# Patient Record
Sex: Female | Born: 1940
Health system: Southern US, Community
[De-identification: ages and names within clinical notes are randomized; demographics above are authoritative.]

## PROBLEM LIST (undated history)

## (undated) DIAGNOSIS — K219 Gastro-esophageal reflux disease without esophagitis: Secondary | ICD-10-CM

## (undated) DIAGNOSIS — E11319 Type 2 diabetes mellitus with unspecified diabetic retinopathy without macular edema: Secondary | ICD-10-CM

## (undated) DIAGNOSIS — T8859XA Other complications of anesthesia, initial encounter: Secondary | ICD-10-CM

## (undated) DIAGNOSIS — Z87442 Personal history of urinary calculi: Secondary | ICD-10-CM

## (undated) DIAGNOSIS — N189 Chronic kidney disease, unspecified: Secondary | ICD-10-CM

## (undated) DIAGNOSIS — I639 Cerebral infarction, unspecified: Secondary | ICD-10-CM

## (undated) DIAGNOSIS — E119 Type 2 diabetes mellitus without complications: Secondary | ICD-10-CM

## (undated) DIAGNOSIS — I10 Essential (primary) hypertension: Secondary | ICD-10-CM

## (undated) DIAGNOSIS — H35039 Hypertensive retinopathy, unspecified eye: Secondary | ICD-10-CM

## (undated) HISTORY — PX: EYE SURGERY: SHX253

## (undated) HISTORY — DX: Type 2 diabetes mellitus with unspecified diabetic retinopathy without macular edema: E11.319

## (undated) HISTORY — PX: HAND SURGERY: SHX662

## (undated) HISTORY — DX: Hypertensive retinopathy, unspecified eye: H35.039

## (undated) HISTORY — PX: BREAST BIOPSY: SHX20

## (undated) HISTORY — PX: NO PAST SURGERIES: SHX2092

---

## 1997-10-21 ENCOUNTER — Ambulatory Visit (HOSPITAL_COMMUNITY): Admission: RE | Admit: 1997-10-21 | Discharge: 1997-10-21 | Payer: Self-pay | Admitting: General Surgery

## 1998-10-26 ENCOUNTER — Encounter (HOSPITAL_BASED_OUTPATIENT_CLINIC_OR_DEPARTMENT_OTHER): Payer: Self-pay | Admitting: General Surgery

## 1998-10-26 ENCOUNTER — Ambulatory Visit (HOSPITAL_COMMUNITY): Admission: RE | Admit: 1998-10-26 | Discharge: 1998-10-26 | Payer: Self-pay | Admitting: General Surgery

## 1999-10-28 ENCOUNTER — Ambulatory Visit (HOSPITAL_COMMUNITY): Admission: RE | Admit: 1999-10-28 | Discharge: 1999-10-28 | Payer: Self-pay | Admitting: General Surgery

## 1999-10-28 ENCOUNTER — Encounter (HOSPITAL_BASED_OUTPATIENT_CLINIC_OR_DEPARTMENT_OTHER): Payer: Self-pay | Admitting: General Surgery

## 2000-04-26 ENCOUNTER — Other Ambulatory Visit: Admission: RE | Admit: 2000-04-26 | Discharge: 2000-04-26 | Payer: Self-pay | Admitting: Family Medicine

## 2000-11-08 ENCOUNTER — Ambulatory Visit (HOSPITAL_COMMUNITY): Admission: RE | Admit: 2000-11-08 | Discharge: 2000-11-08 | Payer: Self-pay | Admitting: General Surgery

## 2000-11-08 ENCOUNTER — Encounter (HOSPITAL_BASED_OUTPATIENT_CLINIC_OR_DEPARTMENT_OTHER): Payer: Self-pay | Admitting: General Surgery

## 2001-11-11 ENCOUNTER — Encounter (HOSPITAL_BASED_OUTPATIENT_CLINIC_OR_DEPARTMENT_OTHER): Payer: Self-pay | Admitting: General Surgery

## 2001-11-11 ENCOUNTER — Ambulatory Visit (HOSPITAL_COMMUNITY): Admission: RE | Admit: 2001-11-11 | Discharge: 2001-11-11 | Payer: Self-pay | Admitting: General Surgery

## 2003-05-18 ENCOUNTER — Ambulatory Visit (HOSPITAL_COMMUNITY): Admission: RE | Admit: 2003-05-18 | Discharge: 2003-05-18 | Payer: Self-pay | Admitting: General Surgery

## 2003-09-19 ENCOUNTER — Emergency Department (HOSPITAL_COMMUNITY): Admission: EM | Admit: 2003-09-19 | Discharge: 2003-09-19 | Payer: Self-pay | Admitting: Emergency Medicine

## 2004-12-26 ENCOUNTER — Encounter (INDEPENDENT_AMBULATORY_CARE_PROVIDER_SITE_OTHER): Payer: Self-pay | Admitting: *Deleted

## 2004-12-26 ENCOUNTER — Ambulatory Visit (HOSPITAL_COMMUNITY): Admission: RE | Admit: 2004-12-26 | Discharge: 2004-12-26 | Payer: Self-pay | Admitting: Gastroenterology

## 2005-01-28 ENCOUNTER — Other Ambulatory Visit: Admission: RE | Admit: 2005-01-28 | Discharge: 2005-01-28 | Payer: Self-pay | Admitting: Family Medicine

## 2005-03-24 ENCOUNTER — Ambulatory Visit (HOSPITAL_COMMUNITY): Admission: RE | Admit: 2005-03-24 | Discharge: 2005-03-24 | Payer: Self-pay | Admitting: Family Medicine

## 2005-03-27 ENCOUNTER — Ambulatory Visit (HOSPITAL_COMMUNITY): Admission: RE | Admit: 2005-03-27 | Discharge: 2005-03-27 | Payer: Self-pay | Admitting: Orthopedic Surgery

## 2005-05-16 ENCOUNTER — Encounter (HOSPITAL_COMMUNITY): Admission: RE | Admit: 2005-05-16 | Discharge: 2005-05-24 | Payer: Self-pay | Admitting: Orthopedic Surgery

## 2005-05-29 ENCOUNTER — Encounter (HOSPITAL_COMMUNITY): Admission: RE | Admit: 2005-05-29 | Discharge: 2005-07-03 | Payer: Self-pay | Admitting: Orthopedic Surgery

## 2005-07-05 ENCOUNTER — Encounter (HOSPITAL_COMMUNITY): Admission: RE | Admit: 2005-07-05 | Discharge: 2005-08-04 | Payer: Self-pay | Admitting: Orthopedic Surgery

## 2006-04-30 ENCOUNTER — Ambulatory Visit (HOSPITAL_COMMUNITY): Admission: RE | Admit: 2006-04-30 | Discharge: 2006-04-30 | Payer: Self-pay | Admitting: Family Medicine

## 2007-08-14 ENCOUNTER — Ambulatory Visit (HOSPITAL_COMMUNITY): Admission: RE | Admit: 2007-08-14 | Discharge: 2007-08-14 | Payer: Self-pay | Admitting: Family Medicine

## 2008-04-17 ENCOUNTER — Encounter: Admission: RE | Admit: 2008-04-17 | Discharge: 2008-04-17 | Payer: Self-pay | Admitting: Orthopedic Surgery

## 2008-06-02 ENCOUNTER — Encounter: Admission: RE | Admit: 2008-06-02 | Discharge: 2008-06-02 | Payer: Self-pay | Admitting: Orthopedic Surgery

## 2008-06-24 ENCOUNTER — Encounter: Admission: RE | Admit: 2008-06-24 | Discharge: 2008-08-19 | Payer: Self-pay | Admitting: Orthopedic Surgery

## 2009-02-25 ENCOUNTER — Ambulatory Visit (HOSPITAL_COMMUNITY): Admission: RE | Admit: 2009-02-25 | Discharge: 2009-02-25 | Payer: Self-pay | Admitting: Family Medicine

## 2010-03-09 ENCOUNTER — Ambulatory Visit (HOSPITAL_COMMUNITY)
Admission: RE | Admit: 2010-03-09 | Discharge: 2010-03-09 | Payer: Self-pay | Source: Home / Self Care | Attending: Family Medicine | Admitting: Family Medicine

## 2010-07-15 NOTE — Op Note (Signed)
NAMEVERTA, RIEDLINGER               ACCOUNT NO.:  [id number]   MEDICAL RECORD NO.:  [id number]          PATIENT TYPE:  AMB   LOCATION:  SDS                          FACILITY:  [facility]   PHYSICIAN:  Myrtie Neither, MD      DATE OF BIRTH:  09/06/1940   DATE OF PROCEDURE:  03/27/2005  DATE OF DISCHARGE:                                 OPERATIVE REPORT   PREOPERATIVE DIAGNOSIS:  Comminuted fracture of the right fourth metacarpal.   POSTOPERATIVE DIAGNOSIS:  Comminuted fracture of the right fourth  metacarpal.   OPERATION PERFORMED:  Open reduction internal fixation with 5-hole plate,  right fourth metacarpal.   SURGEON:  Myrtie Neither, MD   ANESTHESIA:  General.   DESCRIPTION OF PROCEDURE:  The patient was taken to the operating room after  given adequate preop medication and given general anesthesia and intubated.  Right hand was prepped with DuraPrep and draped in sterile manner.  Tourniquet and Bovie used for hemostasis.  Then the C-arm was used to  visualize reduction.  Skin incisions were made in the dorsal aspect of the  fourth metacarpal going through the skin and subcutaneous tissue with sharp  and blunt dissection, made down to the fracture site, manipulating,  reduction was then done.  A 5-hole plate was used to stabilize the fracture  with use of four screws.  Copious irrigation was done, followed by wound  closure, 2-0 Vicryl for the subcutaneous and 4-0 nylon for the skin.  Compressive dressing was applied, wrist splint was applied.  The patient  tolerated the procedure quite well and went to recovery room in stable and  satisfactory condition.  The patient is being discharged home, ice packs,  elevation, use of sling, Percocet 1 to 2 every 4 hours as needed for pain.  To return to the office in one week.  Patient is being discharged in stable  and satisfactory condition.      Myrtie Neither, MD  Electronically Signed     AC/MEDQ  D:  03/27/2005  T:  03/27/2005   Job:  [id number]

## 2010-07-15 NOTE — Op Note (Signed)
NAMELAYCE, SPRUNG               ACCOUNT NO.:  [id number]   MEDICAL RECORD NO.:  [id number]          PATIENT TYPE:  AMB   LOCATION:  ENDO                         FACILITY:  [facility]   PHYSICIAN:  Anselmo Rod, M.D.  DATE OF BIRTH:  02-Mar-1940   DATE OF PROCEDURE:  12/26/2004  DATE OF DISCHARGE:                                 OPERATIVE REPORT   PROCEDURE:  Colonoscopy with cold biopsies x8.   ENDOSCOPIST:  Anselmo Rod, M.D.   INSTRUMENT USED:  Olympus videocolonoscope.   INDICATIONS FOR PROCEDURE:  The patient is a 70 year old African American  female undergoing screening colonoscopy to rule out colonic polyps, masses,  etc.   PREPROCEDURE PREPARATION:  Informed consent was secured from the patient.  The patient was fasted for four hours prior to the procedure and prepped  with OsmoPrep pills the night of and the morning of the procedure.  The  risks and benefits of the procedure, including a 10% missed rate of cancer  and polyps was discussed with the patient as well.   PREPROCEDURE PHYSICAL EXAMINATION:  VITAL SIGNS:  Stable.  NECK:  Supple.  LUNGS:  Clear to auscultation.  HEART:  S1 and S2 regular.  ABDOMEN:  Soft with normal bowel sounds.   DESCRIPTION OF PROCEDURE:  The patient was placed in the left lateral  decubitus position and sedated with 60 mg of Demerol and 6 mg of Versed in  slow incremental doses.  Once the patient was adequately sedated and  maintained on low-flow oxygen and continuous cardiac monitoring, the Olympus  videocolonoscope was advanced from the rectum to the cecum.  The appendiceal  orifice and ileocecal valve were visualized and photographed.  There was  some residual stool in the colon.  Multiple washings were done.  Six small  sessile polyps were biopsied from 60 to 70 cm.  The rest of the colon up to  the terminal ileum appeared normal.  Retroflexion in the rectum revealed no  abnormalities.   IMPRESSION:  1.  Six small sessile polyps  biopsied (cold biopsies x8) from the splenic      flexure at 60 to 70 cm.  2.  Otherwise normal colonoscopy up to the terminal ileum.   RECOMMENDATIONS:  1.  Await pathology results.  2.  Repeat colonoscopy depending on pathology results.  3.  Avoid nonsteroidals for the next two weeks.  4.  Outpatient followup as need arises in the future.      Anselmo Rod, M.D.  Electronically Signed     JNM/MEDQ  D:  12/26/2004  T:  12/26/2004  Job:  [id number]   cc:   Renaye Rakers, M.D.  Fax: [phone]

## 2011-02-15 ENCOUNTER — Ambulatory Visit
Admission: RE | Admit: 2011-02-15 | Discharge: 2011-02-15 | Disposition: A | Discharge status: Home / Self Care | Payer: Medicare Other | Source: Ambulatory Visit | Attending: Orthopedic Surgery | Admitting: Orthopedic Surgery

## 2011-02-15 ENCOUNTER — Other Ambulatory Visit: Payer: Self-pay | Admitting: Orthopedic Surgery

## 2011-02-15 DIAGNOSIS — M25562 Pain in left knee: Secondary | ICD-10-CM

## 2012-03-25 ENCOUNTER — Encounter (HOSPITAL_COMMUNITY): Payer: Self-pay | Admitting: Emergency Medicine

## 2012-03-25 ENCOUNTER — Emergency Department (HOSPITAL_COMMUNITY)
Admission: EM | Admit: 2012-03-25 | Discharge: 2012-03-25 | Disposition: A | Discharge status: Home / Self Care | Payer: No Typology Code available for payment source | Attending: Emergency Medicine | Admitting: Emergency Medicine

## 2012-03-25 ENCOUNTER — Emergency Department (HOSPITAL_COMMUNITY): Payer: No Typology Code available for payment source

## 2012-03-25 DIAGNOSIS — Z8673 Personal history of transient ischemic attack (TIA), and cerebral infarction without residual deficits: Secondary | ICD-10-CM | POA: Insufficient documentation

## 2012-03-25 DIAGNOSIS — Z87891 Personal history of nicotine dependence: Secondary | ICD-10-CM | POA: Insufficient documentation

## 2012-03-25 DIAGNOSIS — E119 Type 2 diabetes mellitus without complications: Secondary | ICD-10-CM | POA: Insufficient documentation

## 2012-03-25 DIAGNOSIS — I10 Essential (primary) hypertension: Secondary | ICD-10-CM | POA: Insufficient documentation

## 2012-03-25 DIAGNOSIS — T148XXA Other injury of unspecified body region, initial encounter: Secondary | ICD-10-CM

## 2012-03-25 DIAGNOSIS — Y92410 Unspecified street and highway as the place of occurrence of the external cause: Secondary | ICD-10-CM | POA: Insufficient documentation

## 2012-03-25 DIAGNOSIS — Y9389 Activity, other specified: Secondary | ICD-10-CM | POA: Insufficient documentation

## 2012-03-25 DIAGNOSIS — IMO0002 Reserved for concepts with insufficient information to code with codable children: Secondary | ICD-10-CM | POA: Insufficient documentation

## 2012-03-25 HISTORY — DX: Essential (primary) hypertension: I10

## 2012-03-25 HISTORY — DX: Cerebral infarction, unspecified: I63.9

## 2012-03-25 HISTORY — DX: Type 2 diabetes mellitus without complications: E11.9

## 2012-03-25 MED ORDER — HYDROCODONE-ACETAMINOPHEN 5-325 MG PO TABS
1.0000 | ORAL_TABLET | Freq: Once | ORAL | Status: AC
  Administered 2012-03-25: 1 via ORAL
  Filled 2012-03-25: qty 1

## 2012-03-25 MED ORDER — HYDROCODONE-ACETAMINOPHEN 5-325 MG PO TABS: 1.0000 | ORAL_TABLET | ORAL | Status: DC | PRN
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
## 2012-04-02 ENCOUNTER — Ambulatory Visit (HOSPITAL_COMMUNITY)
Admission: RE | Admit: 2012-04-02 | Discharge: 2012-04-02 | Disposition: A | Discharge status: Home / Self Care | Payer: No Typology Code available for payment source | Source: Ambulatory Visit | Attending: Family Medicine | Admitting: Family Medicine

## 2012-04-02 DIAGNOSIS — M62838 Other muscle spasm: Secondary | ICD-10-CM | POA: Insufficient documentation

## 2012-04-02 DIAGNOSIS — M6281 Muscle weakness (generalized): Secondary | ICD-10-CM | POA: Insufficient documentation

## 2012-04-02 DIAGNOSIS — R262 Difficulty in walking, not elsewhere classified: Secondary | ICD-10-CM | POA: Insufficient documentation

## 2012-04-02 DIAGNOSIS — R29898 Other symptoms and signs involving the musculoskeletal system: Secondary | ICD-10-CM | POA: Insufficient documentation

## 2012-04-02 DIAGNOSIS — IMO0001 Reserved for inherently not codable concepts without codable children: Secondary | ICD-10-CM | POA: Insufficient documentation
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
## 2012-04-04 ENCOUNTER — Ambulatory Visit (HOSPITAL_COMMUNITY)
Admission: RE | Admit: 2012-04-04 | Discharge: 2012-04-04 | Disposition: A | Discharge status: Home / Self Care | Payer: No Typology Code available for payment source | Source: Ambulatory Visit | Attending: Family Medicine | Admitting: Family Medicine

## 2012-04-04 NOTE — Progress Notes (Signed)
Physical Therapy Treatment Patient Details  Name: Sandra Washington MRN: [id number] Date of Birth: [birth year]  Today's Date: 04/04/2012 Time: 0102-7253 PT Time Calculation (min): 44 min  Visit#: 2  of 12   Re-eval: 05/02/12 Charges: Therex x 28' Manual x 12'  Authorization: medicare   Authorization Visit#: 2  of 10    Subjective: Symptoms/Limitations Symptoms: Pt states that she only has a minor amount of pain through her shoulers and neck but rrates it at 7/10. (Pain scale was explained to pt) Pain Assessment Currently in Pain?: Yes Pain Score:   7 Pain Location: Neck (Through B shoulders) Pain Orientation: Right;Left Pain Radiating Towards: R arm   Exercise/Treatments Machines for Strengthening UBE (Upper Arm Bike): 4'@1 .0 Seated Exercises Neck Retraction: 10 reps Cervical Rotation: 10 reps;Right;Left Lateral Flexion: 10 reps;Right;Left Shoulder Shrugs: 10 reps  Manual Therapy Massage: B mid and upper tra to decrease pain and spasms  Physical Therapy Assessment and Plan PT Assessment and Plan Clinical Impression Statement: Pt displays decreased motion in cervical muscles with cervical therex. Pt requires multimodal cueing to facilitate TrA contraction. Manual techniques completed again this session with tightness noted. Pt reports pain decrease to 3/10 at end of session. PT Plan: Begin T-band ex for stability on 3rd treatment. 4th treatment wall pushups and UE flex at wall then progress to prone exercises.       Problem List Patient Active Problem List  Diagnosis  . Muscle spasms of neck  . Weakness of both legs  . Difficulty in walking    PT - End of Session Activity Tolerance: Patient tolerated treatment well General Behavior During Session: [facility] for tasks performed Cognition: [facility] for tasks performed  GP Functional Assessment Tool Used: neck disablitiy  Seth Bake, PTA 04/04/2012, 4:20 PM

## 2012-04-08 ENCOUNTER — Inpatient Hospital Stay (HOSPITAL_COMMUNITY): Admission: RE | Admit: 2012-04-08 | Payer: Medicare Other | Source: Ambulatory Visit | Admitting: Physical Therapy

## 2012-04-09 ENCOUNTER — Encounter (HOSPITAL_COMMUNITY): Payer: Self-pay | Admitting: *Deleted

## 2012-04-09 ENCOUNTER — Emergency Department (HOSPITAL_COMMUNITY)
Admission: EM | Admit: 2012-04-09 | Discharge: 2012-04-09 | Disposition: A | Discharge status: Home / Self Care | Payer: Medicare Other | Attending: Emergency Medicine | Admitting: Emergency Medicine

## 2012-04-09 ENCOUNTER — Emergency Department (HOSPITAL_COMMUNITY): Payer: Medicare Other

## 2012-04-09 ENCOUNTER — Inpatient Hospital Stay (HOSPITAL_COMMUNITY): Admission: RE | Admit: 2012-04-09 | Payer: Medicare Other | Source: Ambulatory Visit | Admitting: *Deleted

## 2012-04-09 DIAGNOSIS — Z8673 Personal history of transient ischemic attack (TIA), and cerebral infarction without residual deficits: Secondary | ICD-10-CM | POA: Insufficient documentation

## 2012-04-09 DIAGNOSIS — N201 Calculus of ureter: Secondary | ICD-10-CM | POA: Insufficient documentation

## 2012-04-09 DIAGNOSIS — Z794 Long term (current) use of insulin: Secondary | ICD-10-CM | POA: Insufficient documentation

## 2012-04-09 DIAGNOSIS — Z7982 Long term (current) use of aspirin: Secondary | ICD-10-CM | POA: Insufficient documentation

## 2012-04-09 DIAGNOSIS — N1330 Unspecified hydronephrosis: Secondary | ICD-10-CM | POA: Insufficient documentation

## 2012-04-09 DIAGNOSIS — E119 Type 2 diabetes mellitus without complications: Secondary | ICD-10-CM | POA: Insufficient documentation

## 2012-04-09 DIAGNOSIS — R112 Nausea with vomiting, unspecified: Secondary | ICD-10-CM | POA: Insufficient documentation

## 2012-04-09 DIAGNOSIS — N132 Hydronephrosis with renal and ureteral calculous obstruction: Secondary | ICD-10-CM

## 2012-04-09 DIAGNOSIS — I10 Essential (primary) hypertension: Secondary | ICD-10-CM | POA: Insufficient documentation

## 2012-04-09 DIAGNOSIS — Z87891 Personal history of nicotine dependence: Secondary | ICD-10-CM | POA: Insufficient documentation

## 2012-04-09 DIAGNOSIS — R509 Fever, unspecified: Secondary | ICD-10-CM | POA: Insufficient documentation

## 2012-04-09 LAB — COMPREHENSIVE METABOLIC PANEL
AST: 15 U/L (ref 0–37)
Albumin: 4 g/dL (ref 3.5–5.2)
BUN: 13 mg/dL (ref 6–23)
Calcium: 9.3 mg/dL (ref 8.4–10.5)
Creatinine, Ser: 0.72 mg/dL (ref 0.50–1.10)
Total Protein: 7 g/dL (ref 6.0–8.3)

## 2012-04-09 LAB — URINALYSIS, ROUTINE W REFLEX MICROSCOPIC
Glucose, UA: >1000 mg/dL — AB
Leukocytes, UA: NEGATIVE
Protein, ur: NEGATIVE mg/dL
Specific Gravity, Urine: 1.018 (ref 1.005–1.030)
pH: 8 (ref 5.0–8.0)

## 2012-04-09 LAB — CBC WITH DIFFERENTIAL/PLATELET
Basophils Absolute: 0 10*3/uL (ref 0.0–0.1)
Basophils Relative: 0 % (ref 0–1)
Eosinophils Absolute: 0.2 10*3/uL (ref 0.0–0.7)
HCT: 36.5 % (ref 36.0–46.0)
Hemoglobin: 11.7 g/dL — ABNORMAL LOW (ref 12.0–15.0)
MCH: 26.8 pg (ref 26.0–34.0)
MCHC: 32.1 g/dL (ref 30.0–36.0)
Monocytes Absolute: 0.6 10*3/uL (ref 0.1–1.0)
Monocytes Relative: 5 % (ref 3–12)
RDW: 14.2 % (ref 11.5–15.5)

## 2012-04-09 LAB — URINE MICROSCOPIC-ADD ON

## 2012-04-09 MED ORDER — HYDROMORPHONE HCL PF 1 MG/ML IJ SOLN
1.0000 mg | Freq: Once | INTRAMUSCULAR | Status: AC
  Administered 2012-04-09: 1 mg via INTRAVENOUS
  Filled 2012-04-09: qty 1

## 2012-04-09 MED ORDER — OXYCODONE-ACETAMINOPHEN 5-325 MG PO TABS: 1.0000 | ORAL_TABLET | Freq: Four times a day (QID) | ORAL | Status: DC | PRN

## 2012-04-09 MED ORDER — ONDANSETRON HCL 4 MG/2ML IJ SOLN
4.0000 mg | Freq: Once | INTRAMUSCULAR | Status: AC
  Administered 2012-04-09: 4 mg via INTRAVENOUS
  Filled 2012-04-09: qty 2

## 2012-04-09 MED ORDER — TAMSULOSIN HCL 0.4 MG PO CAPS: 0.4000 mg | ORAL_CAPSULE | Freq: Every day | ORAL | Status: DC

## 2012-04-09 MED ORDER — OXYCODONE-ACETAMINOPHEN 5-325 MG PO TABS
2.0000 | ORAL_TABLET | Freq: Once | ORAL | Status: AC
  Administered 2012-04-09: 2 via ORAL
  Filled 2012-04-09: qty 2
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
## 2012-04-10 ENCOUNTER — Ambulatory Visit (HOSPITAL_COMMUNITY): Payer: Medicare Other | Admitting: Physical Therapy

## 2012-04-10 ENCOUNTER — Encounter (HOSPITAL_COMMUNITY): Payer: Self-pay | Admitting: *Deleted

## 2012-04-10 ENCOUNTER — Other Ambulatory Visit: Payer: Self-pay | Admitting: Urology

## 2012-04-10 NOTE — Pre-Procedure Instructions (Signed)
Asked to bring blue folder the day of the procedure,insurance card,I.D. driver's license,wear comfortable clothing and have a driver for the day. Asked not to take Advil,Motrin,Ibuprofen,Aleve or any NSAIDS, Aspirin, or Toradol for 72 hours prior to procedure,  No vitamins or herbal medications 7 days prior to procedure. Will stop and vitamins. Instructed to take laxative per doctor's office instructions and eat a light dinner the evening before procedure.   To arrive at 1415  for lithotripsy procedure.

## 2012-04-11 ENCOUNTER — Encounter (HOSPITAL_COMMUNITY): Payer: Self-pay | Admitting: Pharmacy Technician

## 2012-04-11 ENCOUNTER — Inpatient Hospital Stay (HOSPITAL_COMMUNITY): Admission: RE | Admit: 2012-04-11 | Payer: Medicare Other | Source: Ambulatory Visit | Admitting: *Deleted

## 2012-04-12 NOTE — ED Provider Notes (Signed)
Medical screening examination/treatment/procedure(s) were performed by non-physician practitioner and as supervising physician I was immediately available for consultation/collaboration.    Obe Ahlers L [place], MD 04/12/12 1128 

## 2012-04-15 ENCOUNTER — Encounter (HOSPITAL_COMMUNITY): Payer: Self-pay | Admitting: *Deleted

## 2012-04-15 ENCOUNTER — Ambulatory Visit (HOSPITAL_COMMUNITY): Payer: Medicare Other | Admitting: *Deleted

## 2012-04-15 ENCOUNTER — Ambulatory Visit (HOSPITAL_COMMUNITY): Payer: Medicare Other

## 2012-04-15 ENCOUNTER — Encounter (HOSPITAL_COMMUNITY)
Admission: RE | Disposition: A | Discharge status: Home / Self Care | Payer: Self-pay | Source: Ambulatory Visit | Attending: Urology

## 2012-04-15 ENCOUNTER — Ambulatory Visit (HOSPITAL_COMMUNITY)
Admission: RE | Admit: 2012-04-15 | Discharge: 2012-04-15 | Disposition: A | Discharge status: Home / Self Care | Payer: Medicare Other | Source: Ambulatory Visit | Attending: Urology | Admitting: Urology

## 2012-04-15 DIAGNOSIS — I10 Essential (primary) hypertension: Secondary | ICD-10-CM | POA: Insufficient documentation

## 2012-04-15 DIAGNOSIS — N201 Calculus of ureter: Secondary | ICD-10-CM | POA: Insufficient documentation

## 2012-04-15 DIAGNOSIS — Z8673 Personal history of transient ischemic attack (TIA), and cerebral infarction without residual deficits: Secondary | ICD-10-CM | POA: Insufficient documentation

## 2012-04-15 DIAGNOSIS — E119 Type 2 diabetes mellitus without complications: Secondary | ICD-10-CM | POA: Insufficient documentation

## 2012-04-15 DIAGNOSIS — D259 Leiomyoma of uterus, unspecified: Secondary | ICD-10-CM | POA: Insufficient documentation

## 2012-04-15 HISTORY — DX: Chronic kidney disease, unspecified: N18.9

## 2012-04-15 SURGERY — LITHOTRIPSY, ESWL
Anesthesia: LOCAL | Laterality: Left

## 2012-04-15 MED ORDER — DIAZEPAM 5 MG PO TABS
10.0000 mg | ORAL_TABLET | ORAL | Status: AC
  Administered 2012-04-15: 10 mg via ORAL
  Filled 2012-04-15: qty 2

## 2012-04-15 MED ORDER — DEXTROSE-NACL 5-0.45 % IV SOLN
INTRAVENOUS | Status: DC
  Administered 2012-04-15: 15:00:00 via INTRAVENOUS

## 2012-04-15 MED ORDER — CIPROFLOXACIN HCL 500 MG PO TABS
500.0000 mg | ORAL_TABLET | ORAL | Status: AC
  Administered 2012-04-15: 500 mg via ORAL
  Filled 2012-04-15: qty 1

## 2012-04-15 MED ORDER — DIPHENHYDRAMINE HCL 25 MG PO CAPS
25.0000 mg | ORAL_CAPSULE | ORAL | Status: AC
  Administered 2012-04-15: 25 mg via ORAL
  Filled 2012-04-15: qty 1
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
## 2012-04-15 NOTE — Interval H&P Note (Signed)
History and Physical Interval Note:  04/15/2012 8:51 AM  Sandra Washington  has presented today for surgery, with the diagnosis of left ureteral stone   The various methods of treatment have been discussed with the patient and family. After consideration of risks, benefits and other options for treatment, the patient has consented to  Procedure(s): EXTRACORPOREAL SHOCK WAVE LITHOTRIPSY (ESWL) (Left) as a surgical intervention .  The patient's history has been reviewed, patient examined, no change in status, stable for surgery.  I have reviewed the patient's chart and labs.  Questions were answered to the patient's satisfaction.     [name]

## 2012-04-17 ENCOUNTER — Ambulatory Visit (HOSPITAL_COMMUNITY): Payer: Medicare Other | Admitting: Physical Therapy

## 2012-04-19 ENCOUNTER — Ambulatory Visit (HOSPITAL_COMMUNITY)
Admission: RE | Admit: 2012-04-19 | Discharge: 2012-04-19 | Disposition: A | Discharge status: Home / Self Care | Payer: No Typology Code available for payment source | Source: Ambulatory Visit | Attending: Family Medicine | Admitting: Family Medicine

## 2012-04-19 ENCOUNTER — Ambulatory Visit (HOSPITAL_COMMUNITY): Payer: Medicare Other

## 2012-04-19 DIAGNOSIS — R262 Difficulty in walking, not elsewhere classified: Secondary | ICD-10-CM

## 2012-04-19 DIAGNOSIS — M62838 Other muscle spasm: Secondary | ICD-10-CM

## 2012-04-19 DIAGNOSIS — R29898 Other symptoms and signs involving the musculoskeletal system: Secondary | ICD-10-CM

## 2012-04-19 NOTE — Progress Notes (Signed)
Physical Therapy Treatment Patient Details  Name: Sandra Washington MRN: [id number] Date of Birth: 01-06-1941 Charge:  There ex x 10; manual x 20; HMP x 1 Today's Date: 04/19/2012 Time: 1430-1520 PT Time Calculation (min): 50 min  Visit#: 3 of 12  Re-eval: 05/02/12    Authorization: medicare   Authorization Time Period:    Authorization Visit#: 3 of 10   Subjective: Symptoms/Limitations Symptoms: Pt states that she had a 6 mm kidney stone and was admitted to the hospital.  The pt states they were unable to successfully break the stone up and she is still hurting.  Therapist and pt agreed to concentrate more on pt neck pain as we can not be sure how much of back pain is due to stone at this time.    Exercise/Treatments      Seated Exercises Cervical Isometrics: Extension;Right lateral flexion;Left lateral flexion;5 reps Neck Retraction: 10 reps Supine Exercises Neck Retraction: 10 reps Other Supine Exercise: cervical SB/ Rot active and passive Other Supine Exercise: abdominal isometric.x 10  Modalities Modalities: Moist Heat Manual Therapy Manual Therapy: Myofascial release Myofascial Release: suboccipital release, myofascial, PROM and jt mobs. Moist Heat Therapy Number Minutes Moist Heat: 20 Minutes Moist Heat Location: Other (comment) (back)  Physical Therapy Assessment and Plan PT Assessment and Plan Clinical Impression Statement: Pt has had a departue from therapy due to hospitalization for kidney stones.  Will continue to progress ROM and strength of cervical and lumbar area.  PT Treatment/Interventions: Therapeutic activities;Therapeutic exercise;Modalities;Manual techniques;Patient/family education PT Plan: Begin T-band ex for stability on 4rd treatment. 5th treatment wall pushups and UE flex at wall then progress to prone exercises.     Goals Home Exercise Program PT Goal: Perform Home Exercise Program - Progress: Progressing toward goal PT Short Term  Goals PT Short Term Goal 1: pain no greater than a 5/10 PT Short Term Goal 1 - Progress: Progressing toward goal PT Short Term Goal 2: no H/A PT Short Term Goal 2 - Progress: Progressing toward goal PT Short Term Goal 3: Pt to be walking 15 minutes a day  PT Short Term Goal 3 - Progress: Progressing toward goal PT Long Term Goals PT Long Term Goal 1: I in advance HEP PT Long Term Goal 1 - Progress: Progressing toward goal PT Long Term Goal 2: Pt pain to be no greater than a 2/10 80% of the day PT Long Term Goal 2 - Progress: Progressing toward goal Long Term Goal 3: strenght of LE to be improved one grade to allow pt to walk 40 minutes without difficutlty. Long Term Goal 3 Progress: Progressing toward goal  Problem List Patient Active Problem List  Diagnosis  . Muscle spasms of neck  . Weakness of both legs  . Difficulty in walking    PT - End of Session Activity Tolerance: Patient tolerated treatment well General Behavior During Session: [facility] for tasks performed Cognition: [facility] for tasks performed  GP    RUSSELL,CINDY 04/19/2012, 4:56 PM

## 2012-04-22 ENCOUNTER — Ambulatory Visit (HOSPITAL_COMMUNITY)
Admission: RE | Admit: 2012-04-22 | Discharge: 2012-04-22 | Disposition: A | Discharge status: Home / Self Care | Payer: No Typology Code available for payment source | Source: Ambulatory Visit | Attending: Family Medicine | Admitting: Family Medicine

## 2012-04-22 DIAGNOSIS — R29898 Other symptoms and signs involving the musculoskeletal system: Secondary | ICD-10-CM

## 2012-04-22 DIAGNOSIS — R262 Difficulty in walking, not elsewhere classified: Secondary | ICD-10-CM

## 2012-04-22 DIAGNOSIS — M62838 Other muscle spasm: Secondary | ICD-10-CM

## 2012-04-22 NOTE — Progress Notes (Signed)
Physical Therapy Treatment Patient Details  Name: Sandra Washington MRN: [id number] Date of Birth: Jun 11, 1940  Today's Date: 04/22/2012 Time: 1525-1603 PT Time Calculation (min): 38 min  Visit#: 4 of 12  Re-eval: 05/02/12    Authorization: medicare   Authorization Visit#: 4 of 10   Subjective: Symptoms/Limitations Symptoms: Pt states she felt much better after last theapy session.  Pt  doing ex at home Therapist and pt agreed to concentrate more on pt neck pain as we can not be sure how much of back pain is due to stone at this time.      Exercise/Treatments   Theraband Exercises Scapula Retraction: 10 reps Shoulder Extension: 10 reps Rows: 10 reps Standing Exercises   Seated Exercises Cervical Rotation: 10 reps Lateral Flexion: 10 reps W Back: 10 reps Supine Exercises Other Supine Exercise: ab set; bridge x 10; k-c, active ham 3x 30" Modalities Modalities: Moist Heat Manual Therapy Manual Therapy: Myofascial release Myofascial Release: suboccipital release, jt mobs and manual traction Moist Heat Therapy Number Minutes Moist Heat: 22 Minutes Moist Heat Location: Other (comment) (back)  Physical Therapy Assessment and Plan PT Assessment and Plan Clinical Impression Statement: Pt demonstrates improved c-ROM .  Completed new exercises without difficulty.  Pain c-spine 2/10 back 0/10 at end of session Pt will benefit from skilled therapeutic intervention in order to improve on the following deficits: Decreased activity tolerance;Decreased mobility;Decreased range of motion;Increased muscle spasms;Decreased strength;Difficulty walking;Pain PT Treatment/Interventions: Therapeutic activities;Therapeutic exercise;Modalities;Manual techniques;Patient/family education PT Plan:  5th treatment wall pushups and UE flex at wall then progress to prone exercises.      Problem List Patient Active Problem List  Diagnosis  . Muscle spasms of neck  . Weakness of both legs  .  Difficulty in walking    General Behavior During Session: [facility] for tasks performed Cognition: [facility] for tasks performed  GP Functional Assessment Tool Used: neck disablitiy  Sandra Washington,Sandra Washington 04/22/2012, 5:04 PM

## 2012-04-24 ENCOUNTER — Ambulatory Visit (HOSPITAL_COMMUNITY)
Admission: RE | Admit: 2012-04-24 | Discharge: 2012-04-24 | Disposition: A | Discharge status: Home / Self Care | Payer: No Typology Code available for payment source | Source: Ambulatory Visit | Attending: Physical Therapy | Admitting: Physical Therapy

## 2012-04-24 DIAGNOSIS — R29898 Other symptoms and signs involving the musculoskeletal system: Secondary | ICD-10-CM

## 2012-04-24 DIAGNOSIS — M62838 Other muscle spasm: Secondary | ICD-10-CM

## 2012-04-24 DIAGNOSIS — R262 Difficulty in walking, not elsewhere classified: Secondary | ICD-10-CM

## 2012-04-24 NOTE — Progress Notes (Signed)
Physical Therapy Treatment Patient Details  Name: Sandra Washington MRN: [id number] Date of Birth: 11/17/1940  Today's Date: 04/24/2012 Time: 1440-1526 PT Time Calculation (min): 46 min Charge:  There ex x 24; manual x 20; HMP x 20 (HMP placed while therapist was completing manual to cervical.) Visit#: 5 of 12  Re-eval: 05/02/12    Authorization: medicare      Authorization Visit#: 5 of 10   Subjective: Symptoms/Limitations Symptoms: Pt states she was not sure why but Monday night she had intense back pain.  Pt states she still has not passed the kidney stones.   Pain Assessment Pain Score:   5 Pain Location: Back Pain Orientation: Left   Exercise/Treatments  Theraband Exercises Scapula Retraction: 10 reps Shoulder Extension: 10 reps Rows: 10 reps Standing Exercises Wall Push Ups: 10 reps Upper Extremity Flexion with Stabilization: 10 reps Seated Exercises Cervical Rotation: 10 reps W Back: 10 reps;Weight W Back Weights (lbs): 2 Supine Exercises Neck Retraction: 10 reps Other Supine Exercise: LTR x 10 Other Supine Exercise: ab set; bridge x 10; k-c, active ham 3x 30" Modalities Modalities: Moist Heat Manual Therapy Manual Therapy: Myofascial release Myofascial Release: suboccipital release; jt mobs, and manual traction  Moist Heat Therapy Number Minutes Moist Heat: 20 Minutes  Physical Therapy Assessment and Plan PT Assessment and Plan Clinical Impression Statement: Pt pain obliterated with treatment.  Pt encouraged to continue exercises at home when she is having increased pain. Pt will benefit from skilled therapeutic intervention in order to improve on the following deficits: Decreased activity tolerance;Decreased mobility;Decreased range of motion;Increased muscle spasms;Decreased strength;Difficulty walking;Pain PT Frequency: Min 3X/week PT Plan: begin prone ex next treatment    Goals Home Exercise Program Pt will Perform Home Exercise Program:  Independently PT Goal: Perform Home Exercise Program - Progress: Met PT Short Term Goals PT Short Term Goal 1: pain no greater than a 5/10 PT Short Term Goal 1 - Progress: Progressing toward goal PT Short Term Goal 2: no H/A PT Short Term Goal 2 - Progress: Progressing toward goal PT Short Term Goal 3: Pt to be walking 15 minutes a day  PT Short Term Goal 3 - Progress: Progressing toward goal PT Long Term Goals PT Long Term Goal 1: I in advance HEP PT Long Term Goal 1 - Progress: Progressing toward goal PT Long Term Goal 2: Pt pain to be no greater than a 2/10 80% of the day PT Long Term Goal 2 - Progress: Progressing toward goal Long Term Goal 3: strenght of LE to be improved one grade to allow pt to walk 40 minutes without difficutlty. Long Term Goal 3 Progress: Progressing toward goal  Problem List Patient Active Problem List  Diagnosis  . Muscle spasms of neck  . Weakness of both legs  . Difficulty in walking    PT - End of Session Activity Tolerance: Patient tolerated treatment well General Behavior During Session: [facility] for tasks performed Cognition: [facility] for tasks performed  GP    Toivo Bordon,CINDY 04/24/2012, 4:30 PM

## 2012-04-26 ENCOUNTER — Ambulatory Visit (HOSPITAL_COMMUNITY)
Admission: RE | Admit: 2012-04-26 | Discharge: 2012-04-26 | Disposition: A | Discharge status: Home / Self Care | Payer: No Typology Code available for payment source | Source: Ambulatory Visit | Attending: Family Medicine | Admitting: Family Medicine

## 2012-04-26 DIAGNOSIS — R29898 Other symptoms and signs involving the musculoskeletal system: Secondary | ICD-10-CM

## 2012-04-26 DIAGNOSIS — R262 Difficulty in walking, not elsewhere classified: Secondary | ICD-10-CM

## 2012-04-26 DIAGNOSIS — M62838 Other muscle spasm: Secondary | ICD-10-CM

## 2012-04-26 NOTE — Progress Notes (Signed)
Physical Therapy Treatment Patient Details  Name: Sandra Washington MRN: [id number] Date of Birth: [birth year]  Today's Date: 04/26/2012 Time: 1308-6578 PT Time Calculation (min): 42 min  Visit#: 6 of 12  Re-eval: 05/02/12  charge:  HMP; There ex x 24; manual x 15  Authorization: medicare   Authorization Time Period:    Authorization Visit#: 6 of 10   Subjective: Symptoms/Limitations Symptoms: Pt states that she is feeling better.  Doing exercises at home. Pain Assessment Currently in Pain?: Yes Pain Score:   3 Pain Location: Back    Exercise/Treatments       Theraband Exercises Scapula Retraction: 10 reps Shoulder Extension: 10 reps Rows: 10 reps Standing Exercises Wall Push Ups: 10 reps Upper Extremity Flexion with Stabilization: 10 reps Thumb Tacks:  (1 min) Seated Exercises X to V: 10 reps W Back: 10 reps;Weight W Back Weights (lbs): 2 Supine Exercises Neck Retraction: 10 reps Sidelying Exercises   Prone Exercises Shoulder Extension: 10 reps Other Prone Exercise: SLR; opposite arm leg raise x 10  Modalities Modalities: Moist Heat Manual Therapy Manual Therapy: Myofascial release Myofascial Release: small spasm R mid trap obliterated with myofascial; manal tx and suboccipital release. Moist Heat Therapy Number Minutes Moist Heat: 20 Minutes Moist Heat Location: Other (comment) (lumbar)  Physical Therapy Assessment and Plan PT Assessment and Plan Clinical Impression Statement: Pt states no pain at end of treatment.  Pt able to complete new exercises with good form after verbal cuing. Pt will benefit from skilled therapeutic intervention in order to improve on the following deficits: Decreased activity tolerance;Decreased mobility;Decreased range of motion;Increased muscle spasms;Decreased strength;Difficulty walking;Pain Rehab Potential: Good PT Frequency: Min 3X/week PT Treatment/Interventions: Therapeutic activities;Therapeutic  exercise;Modalities;Manual techniques;Patient/family education PT Plan: begin prone w-back and rows next treatment     Goals Home Exercise Program Pt will Perform Home Exercise Program: Independently PT Short Term Goals PT Short Term Goal 1: pain no greater than a 5/10 PT Short Term Goal 1 - Progress: Met PT Short Term Goal 2: no H/A PT Short Term Goal 2 - Progress: Progressing toward goal PT Short Term Goal 3: Pt to be walking 15 minutes a day  PT Short Term Goal 3 - Progress: Progressing toward goal PT Long Term Goals Time to Complete Long Term Goals: 4 weeks PT Long Term Goal 1: I in advance HEP PT Long Term Goal 1 - Progress: Met PT Long Term Goal 2: Pt pain to be no greater than a 2/10 80% of the day PT Long Term Goal 2 - Progress: Progressing toward goal Long Term Goal 3: strenght of LE to be improved one grade to allow pt to walk 40 minutes without difficutlty. Long Term Goal 3 Progress: Progressing toward goal  Problem List Patient Active Problem List  Diagnosis  . Muscle spasms of neck  . Weakness of both legs  . Difficulty in walking    PT - End of Session Activity Tolerance: Patient tolerated treatment well General Behavior During Session: [facility] for tasks performed Cognition: [facility] for tasks performed  GP Functional Assessment Tool Used: neck disablitiy  RUSSELL,CINDY 04/26/2012, 2:26 PM

## 2012-05-01 ENCOUNTER — Ambulatory Visit (HOSPITAL_COMMUNITY)
Admission: RE | Admit: 2012-05-01 | Discharge: 2012-05-01 | Disposition: A | Discharge status: Home / Self Care | Payer: Medicare Other | Source: Ambulatory Visit | Attending: Family Medicine | Admitting: Family Medicine

## 2012-05-01 DIAGNOSIS — R262 Difficulty in walking, not elsewhere classified: Secondary | ICD-10-CM | POA: Insufficient documentation

## 2012-05-01 DIAGNOSIS — M6281 Muscle weakness (generalized): Secondary | ICD-10-CM | POA: Insufficient documentation

## 2012-05-01 DIAGNOSIS — IMO0001 Reserved for inherently not codable concepts without codable children: Secondary | ICD-10-CM | POA: Insufficient documentation

## 2012-05-02 ENCOUNTER — Ambulatory Visit (HOSPITAL_COMMUNITY)
Admission: RE | Admit: 2012-05-02 | Discharge: 2012-05-02 | Disposition: A | Discharge status: Home / Self Care | Payer: Medicare Other | Source: Ambulatory Visit | Attending: Family Medicine | Admitting: Family Medicine

## 2012-05-02 DIAGNOSIS — R29898 Other symptoms and signs involving the musculoskeletal system: Secondary | ICD-10-CM

## 2012-05-02 DIAGNOSIS — R262 Difficulty in walking, not elsewhere classified: Secondary | ICD-10-CM

## 2012-05-02 DIAGNOSIS — M62838 Other muscle spasm: Secondary | ICD-10-CM

## 2012-05-02 NOTE — Progress Notes (Signed)
Physical Therapy Treatment Patient Details  Name: Sandra Washington MRN: [id number] Date of Birth: Jan 03, 1941  Today's Date: 05/02/2012 Time: 1430-1510 PT Time Calculation (min): 40 min  Visit#: 7 of 12  Re-eval: 05/03/12    Authorization: medicare  Authorization Time Period:    Authorization Visit#: 7 of 10   Subjective: Symptoms/Limitations Symptoms: Pt states that she is feeling better.  Doing exercises at home.  no neck pain; some back pain Pain Assessment Pain Location: Back Pain Orientation: Left  Precautions/Restrictions     Exercise/Treatments Mobility/Balance         Theraband Exercises Scapula Retraction:  (HEP) Shoulder Extension:  (HEP) Rows:  (HEP) Prone Exercises Axial Exentsion: 10 reps Neck Retraction: 10 reps Shoulder Extension: 10 reps;Weights Shoulder Extension Weights (lbs): 2 Rows: 10 reps;Weights Rows Weights (lbs): 2 Plank:  (plank x 10 x 5') Other Prone Exercise: SLR; opposite arm leg raisex 10 Other Prone Exercise: B UE extension  2# x 15;   Manual Therapy Manual Therapy: Massage Massage: concentrating on lumbar as pt hs limited pain in her cervical area   Physical Therapy Assessment and Plan PT Assessment and Plan Clinical Impression Statement: added plank; axial extension; w back and prone rows  into program pt able to perfom well with verbal cuing. Pt will benefit from skilled therapeutic intervention in order to improve on the following deficits: Decreased activity tolerance;Decreased mobility;Decreased range of motion;Increased muscle spasms;Decreased strength;Difficulty walking;Pain PT Treatment/Interventions: Therapeutic activities;Therapeutic exercise;Modalities;Manual techniques;Patient/family education PT Plan: begin wall squat    Goals Home Exercise Program PT Goal: Perform Home Exercise Program - Progress: Met PT Short Term Goals PT Short Term Goal 1: pain no greater than a 5/10 PT Short Term Goal 1 - Progress:  Met PT Short Term Goal 2: no H/A PT Short Term Goal 2 - Progress: Met PT Short Term Goal 3: Pt to be walking 15 minutes a day  PT Short Term Goal 3 - Progress: Progressing toward goal PT Long Term Goals PT Long Term Goal 1: I in advance HEP PT Long Term Goal 1 - Progress: Met PT Long Term Goal 2: Pt pain to be no greater than a 2/10 80% of the day PT Long Term Goal 2 - Progress: Progressing toward goal Long Term Goal 3: strenght of LE to be improved one grade to allow pt to walk 40 minutes without difficutlty. Long Term Goal 3 Progress: Progressing toward goal  Problem List Patient Active Problem List  Diagnosis  . Muscle spasms of neck  . Weakness of both legs  . Difficulty in walking       GP    RUSSELL,CINDY 05/02/2012, 3:16 PM

## 2012-05-06 ENCOUNTER — Ambulatory Visit (HOSPITAL_COMMUNITY)
Admission: RE | Admit: 2012-05-06 | Discharge: 2012-05-06 | Disposition: A | Discharge status: Home / Self Care | Payer: Medicare Other | Source: Ambulatory Visit | Attending: Family Medicine | Admitting: Family Medicine

## 2012-05-06 DIAGNOSIS — M62838 Other muscle spasm: Secondary | ICD-10-CM

## 2012-05-06 DIAGNOSIS — R262 Difficulty in walking, not elsewhere classified: Secondary | ICD-10-CM

## 2012-05-06 DIAGNOSIS — R29898 Other symptoms and signs involving the musculoskeletal system: Secondary | ICD-10-CM

## 2012-05-06 NOTE — Progress Notes (Signed)
Physical Therapy Treatment Patient Details  Name: Sandra Washington MRN: [id number] Date of Birth: May 11, 1940 Charge:  There ex 24; massage 15 Today's Date: 05/06/2012 Time: 5732-2025 PT Time Calculation (min): 38 min  Visit#: 8 of 12  Re-eval: 05/08/12    Authorization: medicare  Authorization Visit#: 8 of 8   Subjective: Symptoms/Limitations Symptoms: continues to have no neck pain and back pain is decreasing as well Pain Assessment Pain Score:   2 Pain Location: Back Pain Orientation: Left     Exercise/Treatments      Aerobic UBE (Upper Arm Bike): 4' @ 2.0   Standing Other Standing Lumbar Exercises: wall push up and B UE x 10   Prone  Straight Leg Raise: 10 reps Opposite Arm/Leg Raise: 10 reps Other Prone Lumbar Exercises: 2# for row, W-back shoulder extension x 10 @ Other Prone Lumbar Exercises: axial extension x 10    Manual Therapy Manual Therapy: Massage Massage: cervical and lumbar area- no more mm spasms palpatable in cervical/trap area will concentrate on lumbar paraspinal mm as these mm continue to be tight.  Physical Therapy Assessment and Plan PT Assessment and Plan Clinical Impression Statement: Pt continues to have good form with exercises. Pt will benefit from skilled therapeutic intervention in order to improve on the following deficits: Decreased activity tolerance;Decreased mobility;Decreased range of motion;Increased muscle spasms;Decreased strength;Difficulty walking;Pain PT Treatment/Interventions: Therapeutic activities;Therapeutic exercise;Modalities;Manual techniques;Patient/family education PT Plan: begin wall squat; reevaluate next treatment along with Neck disablitiy    Goals Home Exercise Program Pt will Perform Home Exercise Program: Independently PT Goal: Perform Home Exercise Program - Progress: Met PT Short Term Goals PT Short Term Goal 1: pain no greater than a 5/10 PT Short Term Goal 1 - Progress: Met PT Short Term  Goal 2: no H/A PT Short Term Goal 2 - Progress: Met PT Short Term Goal 3: Pt to be walking 15 minutes a day  PT Short Term Goal 3 - Progress: Progressing toward goal PT Long Term Goals PT Long Term Goal 1: I in advance HEP PT Long Term Goal 1 - Progress: Met PT Long Term Goal 2: Pt pain to be no greater than a 2/10 80% of the day PT Long Term Goal 2 - Progress: Met Long Term Goal 3: strenght of LE to be improved one grade to allow pt to walk 40 minutes without difficutlty. Long Term Goal 3 Progress: Progressing toward goal  Problem List Patient Active Problem List  Diagnosis  . Muscle spasms of neck  . Weakness of both legs  . Difficulty in walking       GP    RUSSELL,CINDY 05/06/2012, 4:07 PM

## 2012-05-08 ENCOUNTER — Inpatient Hospital Stay (HOSPITAL_COMMUNITY): Admission: RE | Admit: 2012-05-08 | Payer: Medicare Other | Source: Ambulatory Visit | Admitting: Physical Therapy

## 2012-05-08 ENCOUNTER — Ambulatory Visit
Admission: RE | Admit: 2012-05-08 | Discharge: 2012-05-08 | Disposition: A | Discharge status: Home / Self Care | Payer: Medicare Other | Source: Ambulatory Visit | Attending: Orthopedic Surgery | Admitting: Orthopedic Surgery

## 2012-05-08 ENCOUNTER — Other Ambulatory Visit: Payer: Self-pay | Admitting: Orthopedic Surgery

## 2012-05-08 DIAGNOSIS — M2391 Unspecified internal derangement of right knee: Secondary | ICD-10-CM

## 2012-05-10 ENCOUNTER — Inpatient Hospital Stay (HOSPITAL_COMMUNITY): Admission: RE | Admit: 2012-05-10 | Payer: Medicare Other | Source: Ambulatory Visit | Admitting: Physical Therapy

## 2012-05-14 ENCOUNTER — Ambulatory Visit (HOSPITAL_COMMUNITY): Payer: Medicare Other | Admitting: *Deleted

## 2012-05-16 ENCOUNTER — Inpatient Hospital Stay (HOSPITAL_COMMUNITY): Admission: RE | Admit: 2012-05-16 | Payer: Medicare Other | Source: Ambulatory Visit | Admitting: *Deleted

## 2012-05-17 ENCOUNTER — Ambulatory Visit (HOSPITAL_COMMUNITY)
Admission: RE | Admit: 2012-05-17 | Discharge: 2012-05-17 | Disposition: A | Discharge status: Home / Self Care | Payer: Medicare Other | Source: Ambulatory Visit | Attending: Family Medicine | Admitting: Family Medicine

## 2012-05-17 DIAGNOSIS — R29898 Other symptoms and signs involving the musculoskeletal system: Secondary | ICD-10-CM

## 2012-05-17 DIAGNOSIS — R262 Difficulty in walking, not elsewhere classified: Secondary | ICD-10-CM

## 2012-05-17 DIAGNOSIS — M62838 Other muscle spasm: Secondary | ICD-10-CM

## 2012-05-17 NOTE — Evaluation (Signed)
Physical Therapy Reassessment Patient Details  Name: Sandra Washington MRN: [id number] Date of Birth: 06-06-1940 Charge: mm test ROM test Today's Date: 05/17/2012 Time: 4098-1191 PT Time Calculation (min): 39 min              Visit#: 9 of 9  Re-eval:   Assessment Diagnosis: cervical/lumbar strain Next MD Visit: 05/2012 Prior Therapy: none  Authorization: medicare     Authorization Visit#: 9 of 9   Past Medical History:  Past Medical History  Diagnosis Date  . Diabetes mellitus without complication   . Hypertension   . Stroke   . Chronic kidney disease     kidney stone   Past Surgical History:  Past Surgical History  Procedure Laterality Date  . No past surgeries    . Hand surgery      Subjective Symptoms/Limitations Symptoms: Sandra Washington states she is a little stiff but not any major pain.   How long can you sit comfortably?: Able to sit for about 30 minutes now was 45 minutes.   How long can you stand comfortably?: able to stand for 20-30 minutes.  How long can you walk comfortably?: The longests the patient has walked has been 20 minutes was 5 minutes. 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  Leisure: Hobbies-yes (Comment)  Sensation/Coordination/Flexibility/Functional Tests Functional Tests Functional Tests: neck disability  (neck disability 19/50)  Assessment RLE Strength Right Hip Flexion: 3+/5 (was 3-/5) Right Hip Extension: 3/5 Right Hip ABduction: 3+/5 Right Knee Flexion: 4/5 ( was 4/5) Right Knee Extension: 5/5 (was 3/5) Right Ankle Dorsiflexion: 5/5 (was 3+/5) LLE Strength Left Hip Flexion: 5/5 (was 3/5) Left Hip Extension: 3/5 Left Hip ABduction: 4/5 Left Knee Flexion: 5/5 Left Knee Extension: 5/5 (was 3+/5) Left Ankle Dorsiflexion: 5/5 (was 3-/5) Cervical AROM Cervical Flexion:  wnl Cervical Extension: wnl was decreased 10% Cervical - Right Side Bend: wnl was decreased 40% Cervical - Left Side Bend: wnl pain no change Cervical - Right Rotation: wnl Cervical - Left Rotation: decreased 10% was decreased 10% Cervical Strength Cervical Extension: 4/5 (was 2/5) Cervical - Right Side Bend: 4/5 (was 2/5) Cervical - Left Side Bend: 5/5 (was 2/5) Lumbar AROM Lumbar Flexion: wnl Lumbar Extension: wnl Lumbar - Right Side Bend: wnl (was decreased 80%) Lumbar - Left Side Bend: wnl was decreased 80% Lumbar - Right Rotation: decreased 20% was decreased 70% Lumbar - Left Rotation: decreased 20%was decreased 20% Palpation Palpation: Pt very tight B trap but no spasms.  Exercise/Treatments Mobility/Balance  Static Standing Balance Single Leg Stance - Right Leg: 15 Single Leg Stance - Left Leg: 13     Physical Therapy Assessment and Plan PT Assessment and Plan Clinical Impression Statement: Pt neck and back pain have improved significantly.  Pt is now complaining of her knee giving out and increased pain.  Pt will be discharged from her neck and back therapy and we will concentrate on her knee. PT Plan: discharge.    Goals Home Exercise Program PT Goal: Perform Home Exercise Program - Progress: Met PT Short Term Goals Time to Complete Short Term Goals: 2 weeks PT Short Term Goal 1 - Progress: Met PT Short Term Goal 2: no H/A (Pt has only had one slight headache) PT Short Term Goal 2 - Progress: Progressing toward goal PT Short Term Goal 3: Pt to be walking 15 minutes a day  PT Short Term  Goal 3 - Progress: Met PT Long Term Goals Time to Complete Long Term Goals: 4 weeks PT Long Term Goal 1: I in advance HEP PT Long Term Goal 1 - Progress: Met PT Long Term Goal 2: Pt pain to be no greater than a 2/10 80% of the day PT Long Term Goal 2 - Progress: Met Long Term Goal 3: strenght of LE to be improved one grade to allow pt to walk 40 minutes without difficutlty.  (progressing except for hip mm on right LE) Long Term Goal 3 Progress: Progressing toward goal  Problem List Patient Active Problem List  Diagnosis  . Muscle spasms of neck  . Weakness of both legs  . Difficulty in walking    PT - End of Session Activity Tolerance: Patient tolerated treatment well General Behavior During Session: [facility] for tasks performed Cognition: [facility] for tasks performed PT Plan of Care PT Home Exercise Plan: new HEP given to focus on mm that are still weak.  GP Functional Assessment Tool Used: neck disabilty  Functional Limitation: Self care Self Care Goal Status ([id number]): At least 1 percent but less than 20 percent impaired, limited or restricted Self Care Discharge Status [phone]): At least 20 percent but less than 40 percent impaired, limited or restricted  Sandra Washington,CINDY 05/17/2012, 4:31 PM  Physician Documentation Your signature is required to indicate approval of the treatment plan as stated above.  Please sign and either send electronically or make a copy of this report for your files and return this physician signed original.   Please mark one 1.__approve of plan  2. ___approve of plan with the following conditions.   ______________________________                                                          _____________________ Physician Signature                                                                                                             Date
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
## 2012-05-22 ENCOUNTER — Ambulatory Visit (HOSPITAL_COMMUNITY)
Admission: RE | Admit: 2012-05-22 | Discharge: 2012-05-22 | Disposition: A | Discharge status: Home / Self Care | Payer: Medicare Other | Source: Ambulatory Visit | Attending: Family Medicine | Admitting: Family Medicine

## 2012-05-22 NOTE — Progress Notes (Signed)
Physical Therapy Treatment Patient Details  Name: Sandra Washington MRN: [id number] Date of Birth: [birth year]  Today's Date: 05/22/2012 Time: 1440-1520 PT Time Calculation (min): 40 min  Visit#: 2 of 8  Re-eval: 05/17/12 Authorization: medicare  Authorization Visit#: 2 of 8  Charges:  therex 38'  Subjective: Symptoms/Limitations Symptoms: Pt states she returns to Dr. Montez Morita on April 8th.  States she has pain posterior RT knee around to lateral side.  Reports the pain was "crucial" this morning but after taking a pain pill it is now 6/10. Pain Assessment Currently in Pain?: Yes Pain Score:   6 Pain Location: Knee Pain Orientation: Right;Posterior;Lower   Exercise/Treatments Aerobic Stationary Bike: 6' @ 2.5 seat 10 (change to NUSTEP next visit) Standing Heel Raises: 10 reps;Limitations Heel Raises Limitations: toeraises 10reps Knee Flexion: 10 reps;Limitations Knee Flexion Limitations: 3# Functional Squat: 10 reps Rocker Board: 2 minutes Supine Bridges: 10 reps;Right Straight Leg Raises: 10 reps;Right Sidelying Hip ABduction: Right;10 reps Prone  Hamstring Curl: 10 reps Hip Extension: 10 reps    Physical Therapy Assessment and Plan PT Assessment and Plan Clinical Impression Statement: Able to complete all new exercises as instructed for R LE without difficulty or c/o pain.  Pt. required tactile cues to perform squats in correct form.  Pt. reported overall pain reduction and knee feeling looser at end of session. PT Plan: Progress to SLS, TKE, wallslides and  lunges next visit.  Change from bike to NUSTEP for activity tolerance.     Problem List Patient Active Problem List  Diagnosis  . Muscle spasms of neck  . Weakness of both legs  . Difficulty in walking    General Behavior During Session: [facility] for tasks performed Cognition: [facility] for tasks performed   Lurena Nida, PTA/CLT 05/22/2012, 4:26 PM

## 2012-05-23 ENCOUNTER — Ambulatory Visit (HOSPITAL_COMMUNITY)
Admission: RE | Admit: 2012-05-23 | Discharge: 2012-05-23 | Disposition: A | Discharge status: Home / Self Care | Payer: Medicare Other | Source: Ambulatory Visit | Attending: Family Medicine | Admitting: Family Medicine

## 2012-05-23 NOTE — Progress Notes (Addendum)
Physical Therapy Treatment Patient Details  Name: ESABELLA STOCKINGER MRN: [id number] Date of Birth: 29-May-1940  Today's Date: 05/23/2012 Time: 4696-2952 PT Time Calculation (min): 40 min  Visit#: 3 of 8  Re-eval: 05/17/12 Charges: Therex x 30' Manual x 8'  Authorization: medicare  Authorization Visit#: 3 of 8   Subjective: Symptoms/Limitations Symptoms: Pt reports continued HEP compliance. Pain Assessment Currently in Pain?: Yes Pain Score:   3 Pain Location: Knee Pain Orientation: Right;Posterior   Exercise/Treatments Standing Heel Raises: 20 reps Heel Raises Limitations: toeraises x 20 Knee Flexion: 10 reps;Limitations Knee Flexion Limitations: 3# Lateral Step Up: 10 reps;Right;Step Height: 4";Hand Hold: 2 Forward Step Up: 10 reps;Step Height: 6";Hand Hold: 2 Functional Squat: 10 reps Rocker Board: 2 minutes Supine Bridges: 10 reps Straight Leg Raises: 10 reps Sidelying Hip ABduction: Right;10 reps Prone  Hamstring Curl: 10 reps;Limitations Hamstring Curl Limitations: 5# Hip Extension: 10 reps   Manual Therapy Manual Therapy: Massage Massage: STM to posteriors knee to decrease pain and spasms.  Physical Therapy Assessment and Plan PT Assessment and Plan Clinical Impression Statement: Pt completes therex well with minimal need for cueing. Pt's chief complaint is of pain in the posterior knee. Pt reports no increase in pain with therex. Manual techniques completed to R posterior knee to decrease pain and spasms. PT Plan: Progress to SLS, TKE, wallslides and lunges next visit.     Problem List Patient Active Problem List  Diagnosis  . Muscle spasms of neck  . Weakness of both legs  . Difficulty in walking    PT - End of Session Activity Tolerance: Patient tolerated treatment well General Behavior During Session: [facility] for tasks performed Cognition: [facility] for tasks performed  Seth Bake, PTA  05/23/2012, 4:04 PM

## 2012-05-28 ENCOUNTER — Ambulatory Visit (HOSPITAL_COMMUNITY)
Admission: RE | Admit: 2012-05-28 | Discharge: 2012-05-28 | Disposition: A | Discharge status: Home / Self Care | Payer: Medicare Other | Source: Ambulatory Visit | Attending: Family Medicine | Admitting: Family Medicine

## 2012-05-28 DIAGNOSIS — M6281 Muscle weakness (generalized): Secondary | ICD-10-CM | POA: Insufficient documentation

## 2012-05-28 DIAGNOSIS — R262 Difficulty in walking, not elsewhere classified: Secondary | ICD-10-CM | POA: Insufficient documentation

## 2012-05-28 DIAGNOSIS — IMO0001 Reserved for inherently not codable concepts without codable children: Secondary | ICD-10-CM | POA: Insufficient documentation

## 2012-05-28 NOTE — Evaluation (Addendum)
Physical Therapy Reassessment Patient Details  Name: Sandra Washington MRN: [id number] Date of Birth: March 09, 1940 Charge: mm test, [place], there ex x 22 Today's Date: 05/28/2012 Time: 7829-5621 PT Time Calculation (min): 49 min              Visit#: 4 of 9  Re-eval: 06/11/12 Assessment Diagnosis: knee pain Next MD Visit: 05/2012 Prior Therapy: none  Authorization: medicare    Authorization Time Period:    Authorization Visit#: 4 of 8   Past Medical History:  Past Medical History  Diagnosis Date  . Diabetes mellitus without complication   . Hypertension   . Stroke   . Chronic kidney disease     kidney stone   Past Surgical History:  Past Surgical History  Procedure Laterality Date  . No past surgeries    . Hand surgery      Subjective Symptoms/Limitations Symptoms: Pt states after she works out she feels better Pain Assessment Currently in Pain?: Yes Pain Score:   3 Pain Location: Knee Pain Orientation: Right;Posterior;Medial;Lateral   Prior Functioning  Prior Function Vocation: Part time employment Vocation Requirements: coordinator for adult daycare  Leisure: Hobbies-yes (Comment)     Sensation/Coordination/Flexibility/Functional Tests Functional Tests Functional Tests:  (neck disability 19/50)  Assessment RLE AROM (degrees) Right Knee Extension: 0 Right Knee Flexion: 125 RLE Strength Right Hip Flexion: 5/5 (was 3+/5) Right Hip Extension:  (4-/5 WAS 3/5) Right Hip ABduction: 5/5 (WAS 3+/5) Right Knee Flexion: 5/5 ( was 4/5) Right Knee Extension: 5/5 (was 3/5) Right Ankle Dorsiflexion: 5/5 (was 3+/5)  Exercise/Treatments Mobility/Balance  Static Standing Balance Single Leg Stance - Right Leg: 15 Single Leg Stance - Left Leg: 13      Aerobic Stationary Bike: Nustep L 2 x 10 min.   Standing Knee Flexion: Right;10 reps Knee Flexion Limitations: 5# Lateral Step Up: 15 reps;Step Height: 6" Forward Step Up: 15 reps;Step Height: 6" Functional  Squat:  (squat to pick up ball off 6" step raise up to toes x 10) Rocker Board: 2 minutes   Prone  Hamstring Curl: 10 reps Hamstring Curl Limitations: 5# Hip Extension: 10 reps;Limitations Hip Extension Limitations: 2#   Modalities Modalities: Ultrasound Ultrasound Ultrasound Location: posterior knee Ultrasound Parameters: 1.3 MGHz x 8 min Ultrasound Goals: Pain  Physical Therapy Assessment and Plan PT Assessment and Plan Clinical Impression Statement: Pt is progressing well ROM and strength wnl will concentrate on more functional tasks such as stairclimbing and squatting. PT Plan: begin wallslides squatting and lunge walking next time.  Assess how the [place] does for pain  Add balance activities  Goals Home Exercise Program PT Goal: Perform Home Exercise Program - Progress: Met PT Short Term Goals PT Short Term Goal 1: Pt pain to be  no greater than a 3 80% of the time. PT Short Term Goal 1 - Progress: Met PT Short Term Goal 2: Pt strength to be improved to where pt LE is not giving way on her. PT Short Term Goal 2 - Progress: Progressing toward goal (still tends to give way in the morning.) PT Short Term Goal 3: Pt to be walking 30  minutes a day  PT Short Term Goal 3 - Progress: Not met PT Long Term Goals PT Long Term Goal 1: I in advance HEP PT Long Term Goal 1 - Progress: Met PT Long Term Goal 2: no knee pain PT Long Term Goal 2 - Progress: Progressing toward goal Long Term Goal 3: Pt to be able to go up  and down steps without difficulty Long Term Goal 3 Progress: Progressing toward goal Long Term Goal 4: pt to be able to squat and rise froma squatted postion to be able to complete proper body mechanics while picking items off the floor (feels as if she is going to lose her balance.) Long Term Goal 4 Progress: Not met  Problem List Patient Active Problem List  Diagnosis  . Muscle spasms of neck  . Weakness of both legs  . Difficulty in walking       GP     Camille Thau,CINDY 05/28/2012, 3:18 PM  Physician Documentation Your signature is required to indicate approval of the treatment plan as stated above.  Please sign and either send electronically or make a copy of this report for your files and return this physician signed original.   Please mark one 1.__approve of plan  2. ___approve of plan with the following conditions.   ______________________________                                                          _____________________ Physician Signature                                                                                                             Date

## 2012-05-30 ENCOUNTER — Ambulatory Visit (HOSPITAL_COMMUNITY)
Admission: RE | Admit: 2012-05-30 | Discharge: 2012-05-30 | Disposition: A | Discharge status: Home / Self Care | Payer: Medicare Other | Source: Ambulatory Visit | Attending: Family Medicine | Admitting: Family Medicine

## 2012-05-30 NOTE — Progress Notes (Signed)
Physical Therapy Treatment Patient Details  Name: Sandra Washington MRN: [id number] Date of Birth: 1940/09/05  Today's Date: 05/30/2012 Time: 6213-0865 PT Time Calculation (min): 47 min Charge: There ex 34'; [place] x 8 Visit#: 5 of 9  Re-eval: 06/11/12    Authorization: medicare  Authorization Time Period:    Authorization Visit#: 5 of 9   Subjective: Symptoms/Limitations Symptoms: Pt states that the [place] gave her pain relief. Pain Assessment Currently in Pain?: Yes Pain Score:   2 Pain Location: Knee Pain Orientation: Right Pain Type: Chronic pain   Exercise/Treatments   Aerobic Stationary Bike: Nustep L 2 x 10 min.   Standing Knee Flexion: Right;10 reps Knee Flexion Limitations: 5# Lateral Step Up: 15 reps;Step Height: 6" Forward Step Up: 15 reps;Step Height: 6" Functional Squat:  (squat to pick up ball off 6" step raise up to toes x 10) Wall Squat: 10 reps Lunge Walking - Round Trips: 1 RT Rocker Board: 2 minutes   Prone  Hamstring Curl: 10 reps Hamstring Curl Limitations: 5# Hip Extension: 10 reps;Limitations Hip Extension Limitations: 3#   Modalities Modalities: Ultrasound Ultrasound Ultrasound Location: posterior knee Ultrasound Parameters: 1.3 MHz x 8 min  Physical Therapy Assessment and Plan PT Assessment and Plan Clinical Impression Statement: Pt states US improved her pain; MD happy with progress.  PT began wall squat and lunge walking PT Plan: begin stair climbing    Goals  Progressing  Problem List Patient Active Problem List  Diagnosis  . Muscle spasms of neck  . Weakness of both legs  . Difficulty in walking       GP    RUSSELL,CINDY 05/30/2012, 3:11 PM

## 2012-06-04 ENCOUNTER — Ambulatory Visit (HOSPITAL_COMMUNITY)
Admission: RE | Admit: 2012-06-04 | Discharge: 2012-06-04 | Disposition: A | Discharge status: Home / Self Care | Payer: Medicare Other | Source: Ambulatory Visit | Attending: Family Medicine | Admitting: Family Medicine

## 2012-06-04 NOTE — Progress Notes (Signed)
Physical Therapy Treatment Patient Details  Name: BERANIA PEEDIN MRN: [id number] Date of Birth: 21-May-1940 Charge: there ex 38' Today's Date: 06/04/2012 Time: 7829-5621 PT Time Calculation (min): 42 min  Visit#: 6 of 9  Authorization: medicare  Subjective: Symptoms/Limitations Symptoms: I drove today; I feel shaky inside    Exercise/Treatments     Aerobic Stationary Bike: Nustep L 2 x 10 min. Standing Knee Flexion: Right;10 reps Knee Flexion Limitations: 5# Lateral Step Up: 15 reps;Step Height: 6" Forward Step Up: 15 reps;Step Height: 6" Functional Squat:  (squat to pick up ball off 6" step raise up to toes x 10) Wall Squat: 15 reps Lunge Walking - Round Trips: 2 RT Stairs: 2 RT Rocker Board: 2 minutes Seated Other Seated Knee Exercises: sit to stand R leg back z 10   Sidelying Hip ABduction: 15 reps;Limitations Hip ABduction Limitations: 5# Prone  Hamstring Curl: 15 reps Hamstring Curl Limitations: 5# Hip Extension: 15 reps;Limitations Hip Extension Limitations: 5#    Physical Therapy Assessment and Plan PT Assessment and Plan Clinical Impression Statement: Pt able to go up an down step reciprocally now.  Pt added sit to stand with no hand hold.  Pt had difficulty with this tastk needing min assist. PT Treatment/Interventions: Therapeutic activities;Therapeutic exercise;Modalities;Manual techniques;Patient/family education PT Plan: pt to continue functioning strengthening.    Goals  progressing  Problem List Patient Active Problem List  Diagnosis  . Muscle spasms of neck  . Weakness of both legs  . Difficulty in walking       GP    Anhad Sheeley,CINDY 06/04/2012, 4:04 PM

## 2012-06-06 ENCOUNTER — Ambulatory Visit (HOSPITAL_COMMUNITY): Payer: Medicare Other | Admitting: Physical Therapy

## 2012-06-10 ENCOUNTER — Ambulatory Visit (HOSPITAL_COMMUNITY)
Admission: RE | Admit: 2012-06-10 | Discharge: 2012-06-10 | Disposition: A | Discharge status: Home / Self Care | Payer: Medicare Other | Source: Ambulatory Visit | Attending: Family Medicine | Admitting: Family Medicine

## 2012-06-10 NOTE — Evaluation (Addendum)
Physical Therapy Discharge Summary  Patient Details  Name: Sandra Washington MRN: [id number] Date of Birth: 1940/12/16  Today's Date: 06/10/2012 Time: 4098-1191 PT Time Calculation (min): 35 min Charges: MMT x 1 Self care x 15'              Visit#: 6 of 9   Authorization: medicare     Past Medical History:  Past Medical History  Diagnosis Date  . Diabetes mellitus without complication   . Hypertension   . Stroke   . Chronic kidney disease     kidney stone   Past Surgical History:  Past Surgical History  Procedure Laterality Date  . No past surgeries    . Hand surgery      Subjective Symptoms/Limitations Symptoms: Pt states that her back is bothering today. Pain Assessment Currently in Pain?: Yes Pain Score:   7 Pain Location: Knee Pain Orientation: Right   Assessment RLE Strength Right Hip Flexion: 5/5 Right Hip Extension: 5/5 (was 4-/5) Right Hip ABduction: 5/5 Right Knee Flexion: 5/5 Right Knee Extension: 5/5 Right Ankle Dorsiflexion: 5/5 LLE Strength Left Hip Flexion: 5/5 Left Hip Extension: 5/5 (was 3/5) Left Hip ABduction: 5/5 (4/5) Left Knee Flexion: 5/5 Left Knee Extension: 5/5 Left Ankle Dorsiflexion: 5/5  Exercise/Treatments Aerobic Stationary Bike: Nustep L 2 x 10 min. Standing Rocker Board: 2 minutes  Physical Therapy Assessment and Plan PT Assessment and Plan Clinical Impression Statement: Pt is no longer limited by knee pain. Pt is having pain today but she believes this is because of the weather. She states that her pain is at or below 3/10 for 80%. Pt is comfortable with D/C to HEP. Pt also plans to begin aquatic therapy at the [facility]. PT Treatment/Interventions: Therapeutic activities;Therapeutic exercise;Modalities;Manual techniques;Patient/family education PT Plan: Recommend D/C to HEP.    Goals PT Short Term Goals PT Short Term Goal 1: Pt pain to be  no greater than a 3 80% of the time. PT Short Term Goal 1 - Progress:  Met PT Short Term Goal 2: Pt strength to be improved to where pt LE is not giving way on her. PT Short Term Goal 3: Pt to be walking 30  minutes a day  PT Short Term Goal 3 - Progress: Met PT Long Term Goals PT Long Term Goal 1: I in advance HEP PT Long Term Goal 1 - Progress: Met PT Long Term Goal 2: no knee pain PT Long Term Goal 2 - Progress: Progressing toward goal Long Term Goal 3: Pt to be able to go up and down steps without difficulty Long Term Goal 3 Progress: Partly met Long Term Goal 4: pt to be able to squat and rise froma squatted postion to be able to complete proper body mechanics while picking items off the floor  Problem List Patient Active Problem List  Diagnosis  . Muscle spasms of neck  . Weakness of both legs  . Difficulty in walking    PT - End of Session Activity Tolerance: Patient tolerated treatment well  GP Functional Assessment Tool Used: clinical judgement Functional Limitation: Mobility: Walking and moving around Mobility: Walking and Moving Around Goal Status [phone]): At least 1 percent but less than 20 percent impaired, limited or restricted Mobility: Walking and Moving Around Discharge Status [phone]): At least 1 percent but less than 20 percent impaired, limited or restricted  Seth Bake, PTA 06/10/2012, 4:41 PM  Physician Documentation Your signature is required to indicate approval of the treatment plan as stated above.  Please sign and either send electronically or make a copy of this report for your files and return this physician signed original.   Please mark one 1.__approve of plan  2. ___approve of plan with the following conditions.   ______________________________                                                          _____________________ Physician Signature                                                                                                             Date

## 2012-06-12 ENCOUNTER — Ambulatory Visit (HOSPITAL_COMMUNITY): Payer: Medicare Other | Admitting: *Deleted

## 2012-06-18 ENCOUNTER — Ambulatory Visit (HOSPITAL_COMMUNITY): Payer: Medicare Other | Admitting: *Deleted

## 2012-06-20 ENCOUNTER — Ambulatory Visit (HOSPITAL_COMMUNITY): Payer: Medicare Other | Admitting: Physical Therapy

## 2012-06-25 ENCOUNTER — Ambulatory Visit (HOSPITAL_COMMUNITY): Payer: Medicare Other | Admitting: Physical Therapy

## 2012-06-27 ENCOUNTER — Ambulatory Visit (HOSPITAL_COMMUNITY): Payer: Medicare Other | Admitting: Physical Therapy

## 2012-10-23 ENCOUNTER — Other Ambulatory Visit: Payer: Self-pay | Admitting: *Deleted

## 2012-10-23 DIAGNOSIS — IMO0001 Reserved for inherently not codable concepts without codable children: Secondary | ICD-10-CM | POA: Insufficient documentation

## 2012-10-23 DIAGNOSIS — E119 Type 2 diabetes mellitus without complications: Secondary | ICD-10-CM

## 2012-11-01 ENCOUNTER — Other Ambulatory Visit (INDEPENDENT_AMBULATORY_CARE_PROVIDER_SITE_OTHER): Payer: Medicare Other

## 2012-11-01 DIAGNOSIS — E119 Type 2 diabetes mellitus without complications: Secondary | ICD-10-CM

## 2012-11-01 LAB — COMPREHENSIVE METABOLIC PANEL
ALT: 15 U/L (ref 0–35)
AST: 19 U/L (ref 0–37)
Albumin: 4.1 g/dL (ref 3.5–5.2)
Alkaline Phosphatase: 51 U/L (ref 39–117)
BUN: 12 mg/dL (ref 6–23)
CO2: 29 mEq/L (ref 19–32)
Calcium: 9.6 mg/dL (ref 8.4–10.5)
Chloride: 106 mEq/L (ref 96–112)
Creatinine, Ser: 0.7 mg/dL (ref 0.4–1.2)
GFR: 114.65 mL/min (ref >60.00)
Glucose, Bld: 121 mg/dL — ABNORMAL HIGH (ref 70–99)
Potassium: 4 mEq/L (ref 3.5–5.1)
Sodium: 139 mEq/L (ref 135–145)
Total Bilirubin: 0.7 mg/dL (ref 0.3–1.2)
Total Protein: 7 g/dL (ref 6.0–8.3)

## 2012-11-01 LAB — URINALYSIS
Bilirubin Urine: NEGATIVE
Hgb urine dipstick: NEGATIVE
Ketones, ur: NEGATIVE
Leukocytes, UA: NEGATIVE
Nitrite: NEGATIVE
Specific Gravity, Urine: 1.02 (ref 1.000–1.030)
Total Protein, Urine: NEGATIVE
Urine Glucose: NEGATIVE
Urobilinogen, UA: 0.2 (ref 0.0–1.0)
pH: 6.5 (ref 5.0–8.0)

## 2012-11-01 LAB — MICROALBUMIN / CREATININE URINE RATIO: Microalb, Ur: 1.7 mg/dL (ref 0.0–1.9)

## 2012-11-06 ENCOUNTER — Encounter: Payer: Self-pay | Admitting: Endocrinology

## 2012-11-06 ENCOUNTER — Ambulatory Visit (INDEPENDENT_AMBULATORY_CARE_PROVIDER_SITE_OTHER): Payer: Medicare Other | Admitting: Endocrinology

## 2012-11-06 VITALS — BP 130/70 | HR 83 | Temp 99.4°F | Resp 10 | Wt 194.0 lb

## 2012-11-06 DIAGNOSIS — E119 Type 2 diabetes mellitus without complications: Secondary | ICD-10-CM

## 2012-11-06 DIAGNOSIS — E785 Hyperlipidemia, unspecified: Secondary | ICD-10-CM

## 2012-11-06 DIAGNOSIS — I10 Essential (primary) hypertension: Secondary | ICD-10-CM | POA: Insufficient documentation

## 2012-11-06 DIAGNOSIS — E7800 Pure hypercholesterolemia, unspecified: Secondary | ICD-10-CM | POA: Insufficient documentation

## 2012-11-06 NOTE — Progress Notes (Signed)
Patient ID: Sandra Washington, female   DOB: 1940-11-01, 72 y.o.   MRN: [id number]  Sandra Washington is an 72 y.o. female.   Reason for Appointment: Diabetes follow-up   History of Present Illness   Diagnosis: Type 2 DIABETES MELITUS, date of diagnosis:   1995       She has been on insulin almost since her diagnosis and has had various regimens over the years  Since 2000 and she had been on basal bolus insulin with Lantus and Humalog with variable control, depending on her compliance    Oral hypoglycemic drugs:  metformin       Side effects from medications: None Insulin regimen: 22Lantus hs . Humalog 8-8-6           Proper timing of medications in relation to meals: Yes.         Monitors blood glucose: Once a day.    Glucometer: One Touch.          Blood Glucose readings from  review of meter : readings before breakfast: 114, 136, 98, 120, pcl 139, hs119  Hypoglycemia frequency:  none recently .          Meals: 3 meals per day.          Physical activity: exercise: water  Aerobics 3/7 days a week        Dietician visit: Most recent: 03/2008         Complications: are: minimal    The last HbgA1c  report is not available   Wt Readings from Last 3 Encounters:  11/06/12 194 lb (87.998 kg)  04/15/12 193 lb 3 oz (87.629 kg)  04/15/12 193 lb 3 oz (87.629 kg)    Appointment on 11/01/2012  Component Date Value Range Status  . Hemoglobin A1C 11/01/2012 7.1* 4.6 - 6.5 % Final   Glycemic Control Guidelines for People with Diabetes:Non Diabetic:  <6%Goal of Therapy: <7%Additional Action Suggested:  >8%   . Sodium 11/01/2012 139  135 - 145 mEq/L Final  . Potassium 11/01/2012 4.0  3.5 - 5.1 mEq/L Final  . Chloride 11/01/2012 106  96 - 112 mEq/L Final  . CO2 11/01/2012 29  19 - 32 mEq/L Final  . Glucose, Bld 11/01/2012 121* 70 - 99 mg/dL Final  . BUN 30/86/5784 12  6 - 23 mg/dL Final  . Creatinine, Ser 11/01/2012 0.7  0.4 - 1.2 mg/dL Final  . Total Bilirubin 11/01/2012 0.7  0.3 -  1.2 mg/dL Final  . Alkaline Phosphatase 11/01/2012 51  39 - 117 U/L Final  . AST 11/01/2012 19  0 - 37 U/L Final  . ALT 11/01/2012 15  0 - 35 U/L Final  . Total Protein 11/01/2012 7.0  6.0 - 8.3 g/dL Final  . Albumin 69/62/9528 4.1  3.5 - 5.2 g/dL Final  . Calcium 41/32/4401 9.6  8.4 - 10.5 mg/dL Final  . GFR 02/72/5366 114.65  >60.00 mL/min Final  . Color, Urine 11/01/2012 YELLOW  Yellow;Lt. Yellow Final  . APPearance 11/01/2012 CLEAR  Clear Final  . Specific Gravity, Urine 11/01/2012 1.020  1.000-1.030 Final  . pH 11/01/2012 6.5  5.0 - 8.0 Final  . Total Protein, Urine 11/01/2012 NEGATIVE  Negative Final  . Urine Glucose 11/01/2012 NEGATIVE  Negative Final  . Ketones, ur 11/01/2012 NEGATIVE  Negative Final  . Bilirubin Urine 11/01/2012 NEGATIVE  Negative Final  . Hgb urine dipstick 11/01/2012 NEGATIVE  Negative Final  . Urobilinogen, UA 11/01/2012 0.2  0.0 - 1.0 Final  .  Leukocytes, UA 11/01/2012 NEGATIVE  Negative Final  . Nitrite 11/01/2012 NEGATIVE  Negative Final  . Microalb, Ur 11/01/2012 1.7  0.0 - 1.9 mg/dL Final  . Creatinine,U 45/40/9811 149.7   Final  . Microalb Creat Ratio 11/01/2012 1.1  0.0 - 30.0 mg/g Final      Medication List       This list is accurate as of: 11/06/12  8:08 AM.  Always use your most recent med list.               ALPRAZolam 0.5 MG tablet  Commonly known as:  XANAX  Take 0.5 mg by mouth daily as needed for anxiety.     aspirin EC 81 MG tablet  Take 81 mg by mouth daily.     bisacodyl 5 MG EC tablet  Commonly known as:  DULCOLAX  Take 5 mg by mouth daily.     BISCOLAX 10 MG suppository  Generic drug:  bisacodyl  Place 10 mg rectally as needed for constipation.     cetirizine 10 MG tablet  Commonly known as:  ZYRTEC  Take 10 mg by mouth daily as needed for allergies.     DUEXIS 800-26.6 MG Tabs  Generic drug:  Ibuprofen-Famotidine  Take 1 tablet by mouth 3 (three) times daily.     HYDROcodone-acetaminophen 10-325 MG per tablet   Commonly known as:  NORCO  Take 1 tablet by mouth every 6 (six) hours as needed for pain.     insulin glargine 100 UNIT/ML injection  Commonly known as:  LANTUS  Inject 25 Units into the skin at bedtime.     insulin lispro 100 UNIT/ML injection  Commonly known as:  HUMALOG  Inject 8-16 Units into the skin 3 (three) times daily before meals. 8 units at breakfast and 8 at lunch and 16 units at supper     losartan-hydrochlorothiazide 100-25 MG per tablet  Commonly known as:  HYZAAR  Take 1 tablet by mouth at bedtime.     meloxicam 15 MG tablet  Commonly known as:  MOBIC  Take 15 mg by mouth daily.     metFORMIN 1000 MG tablet  Commonly known as:  GLUCOPHAGE  Take 1,000 mg by mouth daily.     methocarbamol 500 MG tablet  Commonly known as:  ROBAXIN  Take 500 mg by mouth 4 (four) times daily.     niacin 500 MG CR tablet  Commonly known as:  NIASPAN  Take 1,000 mg by mouth at bedtime.     oxyCODONE-acetaminophen 5-325 MG per tablet  Commonly known as:  PERCOCET  Take 1-2 tablets by mouth every 6 (six) hours as needed for pain.     promethazine 25 MG tablet  Commonly known as:  PHENERGAN  Take 25 mg by mouth every 6 (six) hours as needed for nausea.     simvastatin 40 MG tablet  Commonly known as:  ZOCOR  Take 40 mg by mouth every evening.     tamsulosin 0.4 MG Caps capsule  Commonly known as:  FLOMAX  Take 1 capsule (0.4 mg total) by mouth daily.     Vitamin D (Ergocalciferol) 50000 UNITS Caps capsule  Commonly known as:  DRISDOL  Take 50,000 Units by mouth every Sunday.        Allergies: No Known Allergies  Past Medical History  Diagnosis Date  . Diabetes mellitus without complication   . Hypertension   . Stroke   . Chronic kidney disease  kidney stone    Past Surgical History  Procedure Laterality Date  . No past surgeries    . Hand surgery      History reviewed. No pertinent family history.  Social History:  reports that she has quit smoking.  She has never used smokeless tobacco. She reports that she does not drink alcohol or use illicit drugs.  Review of Systems:  HYPERTENSION:   well-controlled on treatment   HYPERLIPIDEMIA: The lipid abnormality consists of elevated LDL Well controlled with simvastatin, will need followup levels on the next visit      Examination:   BP 130/70  Pulse 83  Temp(Src) 99.4 F (37.4 C) (Oral)  Resp 10  Wt 194 lb (87.998 kg)  BMI 30.38 kg/m2  SpO2 98%  Body mass index is 30.38 kg/(m^2).   ASSESSMENT/ PLAN::   Diabetes type 2   The patient's diabetes control appears to be  fairly good with A1c 7.1%, previously has been higher She is however not checking her blood sugar much recently and only occasionally after meals. She has been trying to exercise regularly and overall watching her diet Will continue her same insulin regimen and encouraged her to check more readings after meals    Jakobee Brackins 11/06/2012, 8:08 AM

## 2012-11-06 NOTE — Patient Instructions (Addendum)
Please check blood sugars at least half the time about 2 hours after any meal and as directed on waking up. Please bring blood sugar monitor to each visit  Confirm insulin dose if incorrect

## 2013-01-01 ENCOUNTER — Other Ambulatory Visit (HOSPITAL_COMMUNITY): Payer: Self-pay | Admitting: Family Medicine

## 2013-01-01 DIAGNOSIS — Z1231 Encounter for screening mammogram for malignant neoplasm of breast: Secondary | ICD-10-CM

## 2013-01-02 ENCOUNTER — Other Ambulatory Visit: Payer: Self-pay | Admitting: *Deleted

## 2013-01-02 MED ORDER — INSULIN GLARGINE 100 UNIT/ML ~~LOC~~ SOLN: SUBCUTANEOUS | Status: DC

## 2013-01-07 ENCOUNTER — Other Ambulatory Visit: Payer: Self-pay | Admitting: *Deleted

## 2013-01-10 ENCOUNTER — Telehealth: Payer: Self-pay

## 2013-01-10 ENCOUNTER — Other Ambulatory Visit: Payer: Self-pay | Admitting: *Deleted

## 2013-01-10 MED ORDER — INSULIN GLARGINE 100 UNIT/ML ~~LOC~~ SOLN: SUBCUTANEOUS | Status: DC

## 2013-01-10 NOTE — Telephone Encounter (Signed)
I called the pharmacy and refaxed the rx so the patient could get her lantus vials instead of pens

## 2013-01-10 NOTE — Telephone Encounter (Signed)
The patient called and stated she is completely out of her insulin.  She called the [facility] pharmacy and they stated they filled her rx with the pen insulin.  However, the patient states she is hoping to get the bottled insulin.  The patient's callback - [phone].    Thanks!

## 2013-01-15 ENCOUNTER — Ambulatory Visit (HOSPITAL_COMMUNITY)
Admission: RE | Admit: 2013-01-15 | Discharge: 2013-01-15 | Disposition: A | Discharge status: Home / Self Care | Payer: Medicare Other | Source: Ambulatory Visit | Attending: Family Medicine | Admitting: Family Medicine

## 2013-01-15 DIAGNOSIS — Z1231 Encounter for screening mammogram for malignant neoplasm of breast: Secondary | ICD-10-CM | POA: Insufficient documentation

## 2013-01-27 ENCOUNTER — Other Ambulatory Visit: Payer: Self-pay | Admitting: *Deleted

## 2013-01-30 ENCOUNTER — Other Ambulatory Visit: Payer: Self-pay | Admitting: *Deleted

## 2013-01-30 MED ORDER — INSULIN GLARGINE 100 UNIT/ML SOLOSTAR PEN: PEN_INJECTOR | SUBCUTANEOUS | Status: DC

## 2013-02-18 ENCOUNTER — Other Ambulatory Visit: Payer: Self-pay | Admitting: *Deleted

## 2013-03-06 ENCOUNTER — Other Ambulatory Visit (INDEPENDENT_AMBULATORY_CARE_PROVIDER_SITE_OTHER): Payer: Medicare PPO

## 2013-03-06 DIAGNOSIS — E119 Type 2 diabetes mellitus without complications: Secondary | ICD-10-CM

## 2013-03-06 DIAGNOSIS — E785 Hyperlipidemia, unspecified: Secondary | ICD-10-CM

## 2013-03-06 LAB — BASIC METABOLIC PANEL
BUN: 13 mg/dL (ref 6–23)
CALCIUM: 9.4 mg/dL (ref 8.4–10.5)
CO2: 28 mEq/L (ref 19–32)
CREATININE: 0.7 mg/dL (ref 0.4–1.2)
Chloride: 106 mEq/L (ref 96–112)
GFR: 103.45 mL/min (ref >60.00)
Glucose, Bld: 179 mg/dL — ABNORMAL HIGH (ref 70–99)
Potassium: 4.5 mEq/L (ref 3.5–5.1)
Sodium: 141 mEq/L (ref 135–145)

## 2013-03-06 LAB — LIPID PANEL
CHOLESTEROL: 152 mg/dL (ref 0–200)
HDL: 54.9 mg/dL (ref >39.00)
LDL Cholesterol: 85 mg/dL (ref 0–99)
Total CHOL/HDL Ratio: 3
Triglycerides: 63 mg/dL (ref 0.0–149.0)
VLDL: 12.6 mg/dL (ref 0.0–40.0)

## 2013-03-06 LAB — HEMOGLOBIN A1C: HEMOGLOBIN A1C: 7.5 % — AB (ref 4.6–6.5)

## 2013-03-10 ENCOUNTER — Encounter: Payer: Self-pay | Admitting: Endocrinology

## 2013-03-10 ENCOUNTER — Ambulatory Visit (INDEPENDENT_AMBULATORY_CARE_PROVIDER_SITE_OTHER): Payer: Medicare Other | Admitting: Endocrinology

## 2013-03-10 VITALS — BP 130/70 | HR 79 | Temp 98.2°F | Resp 12 | Ht 67.5 in | Wt 200.9 lb

## 2013-03-10 DIAGNOSIS — E785 Hyperlipidemia, unspecified: Secondary | ICD-10-CM

## 2013-03-10 DIAGNOSIS — I10 Essential (primary) hypertension: Secondary | ICD-10-CM

## 2013-03-10 DIAGNOSIS — E119 Type 2 diabetes mellitus without complications: Secondary | ICD-10-CM

## 2013-03-10 NOTE — Patient Instructions (Signed)
Reduce 2 units at Bfst or  Lunch if eating little carbs; reduce 4-8 at supper for light meals

## 2013-03-10 NOTE — Progress Notes (Signed)
Patient ID: Sandra Washington, female   DOB: 09/25/1940, 73 y.o.   MRN: [id number]  Reason for Appointment: Diabetes follow-up   History of Present Illness   Diagnosis: Type 2 DIABETES MELITUS, date of diagnosis:   1995       She has been on insulin almost since her diagnosis and has had various regimens over the years  Since 2000 and she had been on basal bolus insulin with Lantus and Humalog with variable control, depending on her compliance with diet and exercise She appears to have gained weight, mostly from not exercising  Also her A1c slightly higher than usual but she has not been beginning consistently high readings recently She does not know how to adjust her insulin based on her meal size and this tending to get hypoglycemia in the evenings recently when eating less. Blood sugars have been as low as 34 Only high fasting reading was possibly after a rebound from hypoglycemia the night before and registered on her lab results as 179    Oral hypoglycemic drugs:  metformin       Side effects from medications: None Insulin regimen: 22 Lantus hs . Humalog 10-05-14 ac tid           Proper timing of medications in relation to meals: Yes.         Monitors blood glucose: Once a day.    Glucometer:  FreeStyle           Blood Glucose readings:  PREMEAL Breakfast Lunch Dinner Bedtime Overall  Glucose range:  72-165   89-179   62, 82   34-217    Mean/median:  106     103  105   POST-MEAL PC Breakfast PC Lunch PC Dinner  Glucose range: ?  ?  34-217  Mean/median:      Hypoglycemia: Twice between 10-11 PM recently         Meals: 3 meals per day. recently not talking much at home because of her husbands chemotherapy related nausea. Yesterday had no carbohydrate at lunch and blood sugar was low normal at suppertime; sometimes not eating a full meal but only snacks in the evening          Physical activity: exercise: none recently, previously was doing water aerobics        Dietician visit:  Most recent: 10/5636         Complications: are: minimal      Wt Readings from Last 3 Encounters:  03/10/13 200 lb 14.4 oz (91.128 kg)  11/06/12 194 lb (87.998 kg)  04/15/12 193 lb 3 oz (87.629 kg)    Lab Results  Component Value Date   HGBA1C 7.5* 03/06/2013   HGBA1C 7.1* 11/01/2012   Lab Results  Component Value Date   MICROALBUR 1.7 11/01/2012   LDLCALC 85 03/06/2013   CREATININE 0.7 03/06/2013     Appointment on 03/06/2013  Component Date Value Range Status  . Sodium 03/06/2013 141  135 - 145 mEq/L Final  . Potassium 03/06/2013 4.5  3.5 - 5.1 mEq/L Final  . Chloride 03/06/2013 106  96 - 112 mEq/L Final  . CO2 03/06/2013 28  19 - 32 mEq/L Final  . Glucose, Bld 03/06/2013 179* 70 - 99 mg/dL Final  . BUN 03/06/2013 13  6 - 23 mg/dL Final  . Creatinine, Ser 03/06/2013 0.7  0.4 - 1.2 mg/dL Final  . Calcium 03/06/2013 9.4  8.4 - 10.5 mg/dL Final  . GFR 03/06/2013 103.45  >60.00  mL/min Final  . Hemoglobin A1C 03/06/2013 7.5* 4.6 - 6.5 % Final   Glycemic Control Guidelines for People with Diabetes:Non Diabetic:  <6%Goal of Therapy: <7%Additional Action Suggested:  >8%   . Cholesterol 03/06/2013 152  0 - 200 mg/dL Final   ATP III Classification       Desirable:  < 200 mg/dL               Borderline High:  200 - 239 mg/dL          High:  > = 240 mg/dL  . Triglycerides 03/06/2013 63.0  0.0 - 149.0 mg/dL Final   Normal:  <150 mg/dLBorderline High:  150 - 199 mg/dL  . HDL 03/06/2013 54.90  >39.00 mg/dL Final  . VLDL 03/06/2013 12.6  0.0 - 40.0 mg/dL Final  . LDL Cholesterol 03/06/2013 85  0 - 99 mg/dL Final  . Total CHOL/HDL Ratio 03/06/2013 3   Final                  Men          Women1/2 Average Risk     3.4          3.3Average Risk          5.0          4.42X Average Risk          9.6          7.13X Average Risk          15.0          11.0                          Medication List       This list is accurate as of: 03/10/13  8:16 AM.  Always use your most recent med list.                ALPRAZolam 0.5 MG tablet  Commonly known as:  XANAX  Take 0.5 mg by mouth daily as needed for anxiety.     amLODipine 10 MG tablet  Commonly known as:  NORVASC  Take 10 mg by mouth daily.     aspirin EC 81 MG tablet  Take 81 mg by mouth daily.     bisacodyl 5 MG EC tablet  Commonly known as:  DULCOLAX  Take 5 mg by mouth daily.     cetirizine 10 MG tablet  Commonly known as:  ZYRTEC  Take 10 mg by mouth daily as needed for allergies.     HYDROcodone-acetaminophen 10-325 MG per tablet  Commonly known as:  NORCO  Take 1 tablet by mouth every 6 (six) hours as needed for pain.     Insulin Glargine 100 UNIT/ML Solostar Pen  Commonly known as:  LANTUS  22 Units. Inject 30 units every day at bedtime     insulin lispro 100 UNIT/ML injection  Commonly known as:  HUMALOG  Inject 8-16 Units into the skin 3 (three) times daily before meals. 8 units at breakfast and 8 at lunch and 16 units at supper     losartan-hydrochlorothiazide 100-25 MG per tablet  Commonly known as:  HYZAAR  Take 1 tablet by mouth at bedtime.     meloxicam 15 MG tablet  Commonly known as:  MOBIC  Take 15 mg by mouth daily.     metFORMIN 1000 MG tablet  Commonly known as:  GLUCOPHAGE  Take 1,000 mg  by mouth daily.     niacin 500 MG CR tablet  Commonly known as:  NIASPAN  Take 1,000 mg by mouth at bedtime.     simvastatin 40 MG tablet  Commonly known as:  ZOCOR  Take 40 mg by mouth every evening.     tamsulosin 0.4 MG Caps capsule  Commonly known as:  FLOMAX  Take 1 capsule (0.4 mg total) by mouth daily.     Vitamin D (Ergocalciferol) 50000 UNITS Caps capsule  Commonly known as:  DRISDOL  Take 50,000 Units by mouth every Sunday.        Allergies: No Known Allergies  Past Medical History  Diagnosis Date  . Diabetes mellitus without complication   . Hypertension   . Stroke   . Chronic kidney disease     kidney stone    Past Surgical History  Procedure Laterality Date  . No past  surgeries    . Hand surgery      No family history on file.  Social History:  reports that she has quit smoking. She has never used smokeless tobacco. She reports that she does not drink alcohol or use illicit drugs.  Review of Systems:  HYPERTENSION:   well-controlled on  losartan HCTZ and amlodipine, also followed by PCP  HYPERLIPIDEMIA: The lipid abnormality consists of elevated LDL Well controlled with simvastatin and niacin,current levels are excellent   No recent numbness or tingling in her feet   She has had her influenza vaccine      Examination:   BP 130/70  Pulse 79  Temp(Src) 98.2 F (36.8 C)  Resp 12  Ht 5' 7.5" (1.715 m)  Wt 200 lb 14.4 oz (91.128 kg)  BMI 30.98 kg/m2  SpO2 98%  Body mass index is 30.98 kg/(m^2).   No ankle edema  ASSESSMENT/ PLAN::   Diabetes type 2   The patient's diabetes control appears to beslightly worse with A1c  7.5  She is however not checking her blood sugar consistently recently Probably does have some high readings after meals including breakfast and supper Fasting readings are usually consistent on this she has a low sugar the night before Currently her main problem is not being able to adjust her insulin based on meal size and carbohydrates; she is reluctant to start carbohydrate counting Also has not been exercising or eating balanced meals  Following recommendations made:  Keep a record of foods eaten and blood sugars before and after meals along with insulin dose to review with nursing educator  Eat more balanced meals  Restarting exercise when able to  More readings after meals  Reduce breakfast and lunch doses by about 2 units when eating smaller meals and supper dose by 4-8 units for smaller meals and low carbohydrate intake  Continue same Lantus unless blood sugars are low fasting  Continue same medications for hyperlipidemia  Counseling time over 50% of today's 25 minute visit  Atara Paterson 03/10/2013, 8:16  AM

## 2013-03-24 ENCOUNTER — Encounter: Payer: Medicare PPO | Attending: Endocrinology | Admitting: Nutrition

## 2013-03-24 DIAGNOSIS — E119 Type 2 diabetes mellitus without complications: Secondary | ICD-10-CM

## 2013-04-14 ENCOUNTER — Other Ambulatory Visit: Payer: Self-pay | Admitting: Orthopedic Surgery

## 2013-04-14 ENCOUNTER — Ambulatory Visit
Admission: RE | Admit: 2013-04-14 | Discharge: 2013-04-14 | Disposition: A | Discharge status: Home / Self Care | Payer: PRIVATE HEALTH INSURANCE | Source: Ambulatory Visit | Attending: Orthopedic Surgery | Admitting: Orthopedic Surgery

## 2013-04-14 DIAGNOSIS — M5430 Sciatica, unspecified side: Secondary | ICD-10-CM

## 2013-04-14 DIAGNOSIS — M25551 Pain in right hip: Secondary | ICD-10-CM

## 2013-04-25 ENCOUNTER — Other Ambulatory Visit: Payer: Self-pay | Admitting: *Deleted

## 2013-04-25 MED ORDER — INSULIN LISPRO 100 UNIT/ML (KWIKPEN): PEN_INJECTOR | SUBCUTANEOUS | Status: DC

## 2013-04-25 MED ORDER — INSULIN GLARGINE 100 UNIT/ML SOLOSTAR PEN: 22.0000 [IU] | PEN_INJECTOR | Freq: Every day | SUBCUTANEOUS | Status: DC

## 2013-05-01 ENCOUNTER — Telehealth: Payer: Self-pay | Admitting: Endocrinology

## 2013-05-01 NOTE — Telephone Encounter (Signed)
The pt will be out of her insulin tomorrow wants to see if she could come by and pick up samples to hold her until she gets her rx.

## 2013-05-05 ENCOUNTER — Other Ambulatory Visit: Payer: Self-pay | Admitting: *Deleted

## 2013-05-05 MED ORDER — INSULIN GLARGINE 100 UNIT/ML SOLOSTAR PEN: PEN_INJECTOR | SUBCUTANEOUS | Status: DC

## 2013-05-05 MED ORDER — INSULIN LISPRO 100 UNIT/ML (KWIKPEN): PEN_INJECTOR | SUBCUTANEOUS | Status: DC

## 2013-05-05 MED ORDER — SIMVASTATIN 40 MG PO TABS: 40.0000 mg | ORAL_TABLET | Freq: Every evening | ORAL | Status: DC

## 2013-05-09 ENCOUNTER — Other Ambulatory Visit (INDEPENDENT_AMBULATORY_CARE_PROVIDER_SITE_OTHER): Payer: PRIVATE HEALTH INSURANCE

## 2013-05-09 DIAGNOSIS — E119 Type 2 diabetes mellitus without complications: Secondary | ICD-10-CM

## 2013-05-09 LAB — BASIC METABOLIC PANEL
BUN: 12 mg/dL (ref 6–23)
CO2: 28 meq/L (ref 19–32)
Calcium: 9.5 mg/dL (ref 8.4–10.5)
Chloride: 106 mEq/L (ref 96–112)
Creatinine, Ser: 0.7 mg/dL (ref 0.4–1.2)
GFR: 105.1 mL/min (ref >60.00)
GLUCOSE: 160 mg/dL — AB (ref 70–99)
POTASSIUM: 4.6 meq/L (ref 3.5–5.1)
SODIUM: 139 meq/L (ref 135–145)

## 2013-05-12 ENCOUNTER — Ambulatory Visit (INDEPENDENT_AMBULATORY_CARE_PROVIDER_SITE_OTHER): Payer: Medicare PPO | Admitting: Endocrinology

## 2013-05-12 ENCOUNTER — Encounter: Payer: Self-pay | Admitting: Endocrinology

## 2013-05-12 VITALS — BP 142/80 | HR 83 | Temp 98.2°F | Resp 16 | Ht 67.5 in | Wt 202.2 lb

## 2013-05-12 DIAGNOSIS — E119 Type 2 diabetes mellitus without complications: Secondary | ICD-10-CM

## 2013-05-12 LAB — FRUCTOSAMINE: Fructosamine: 314 umol/L — ABNORMAL HIGH (ref 190–270)

## 2013-05-12 NOTE — Patient Instructions (Signed)
Please check blood sugars at least half the time about 2 hours after any meal and as directed on waking up. Please bring blood sugar monitor to each visit  Walk daily  Call if am sugar >140

## 2013-05-12 NOTE — Progress Notes (Signed)
Patient ID: Sandra Washington, female   DOB: 1940-06-11, 73 y.o.   MRN: [id number]   Reason for Appointment: Diabetes follow-up   History of Present Illness   Diagnosis: Type 2 DIABETES MELITUS, date of diagnosis:   1995       She has been on insulin almost since her diagnosis and has had various regimens over the years More recently has been on basal bolus insulin with Lantus and Humalog with variable control, depending on her compliance with diet and exercise Although her A1c was slightly higher than usual on her last visit her blood sugars did not show any consistently high readings at home Also more recently her blood sugars look excellent at home; labs not available Blood sugars were reviewed from her meter which cannot be downloaded and recent readings are excellent However her lab glucose was 160 whereas her fasting readings at home are not high; She does have relatively old FreeStyle meter She did have a low blood sugar one evening when she took her insulin but did not eat a meal  Oral hypoglycemic drugs:  metformin       Side effects from medications: None Insulin regimen: 22 Lantus hs . Humalog 10-05-14 ac tid           Proper timing of medications in relation to meals: Yes.         Monitors blood glucose: Once a day.    Glucometer:  FreeStyle           Blood Glucose readings: Fasting 102-131, p.c. breakfast 137, 5 PM 82, bedtime 47-110 Hypoglycemia: Once as above        Meals: 3 meals per day. recently not eating consistently with taking care of her husband       Physical activity: exercise: none recently, previously was doing water aerobics        Dietician visit: Most recent: 06/930         Complications: are: minimal      Wt Readings from Last 3 Encounters:  05/12/13 202 lb 3.2 oz (91.717 kg)  03/10/13 200 lb 14.4 oz (91.128 kg)  11/06/12 194 lb (87.998 kg)    Lab Results  Component Value Date   HGBA1C 7.5* 03/06/2013   HGBA1C 7.1* 11/01/2012   Lab Results   Component Value Date   MICROALBUR 1.7 11/01/2012   LDLCALC 85 03/06/2013   CREATININE 0.7 05/09/2013     Appointment on 05/09/2013  Component Date Value Ref Range Status  . Sodium 05/09/2013 139  135 - 145 mEq/L Final  . Potassium 05/09/2013 4.6  3.5 - 5.1 mEq/L Final  . Chloride 05/09/2013 106  96 - 112 mEq/L Final  . CO2 05/09/2013 28  19 - 32 mEq/L Final  . Glucose, Bld 05/09/2013 160* 70 - 99 mg/dL Final  . BUN 05/09/2013 12  6 - 23 mg/dL Final  . Creatinine, Ser 05/09/2013 0.7  0.4 - 1.2 mg/dL Final  . Calcium 05/09/2013 9.5  8.4 - 10.5 mg/dL Final  . GFR 05/09/2013 105.10  >60.00 mL/min Final      Medication List       This list is accurate as of: 05/12/13  8:45 AM.  Always use your most recent med list.               ALPRAZolam 0.5 MG tablet  Commonly known as:  XANAX  Take 0.5 mg by mouth daily as needed for anxiety.     amLODipine 10 MG tablet  Commonly known as:  NORVASC  Take 10 mg by mouth daily.     aspirin EC 81 MG tablet  Take 81 mg by mouth daily.     bisacodyl 5 MG EC tablet  Commonly known as:  DULCOLAX  Take 5 mg by mouth daily.     cetirizine 10 MG tablet  Commonly known as:  ZYRTEC  Take 10 mg by mouth daily as needed for allergies.     cyclobenzaprine 5 MG tablet  Commonly known as:  FLEXERIL     HYDROcodone-acetaminophen 10-325 MG per tablet  Commonly known as:  NORCO  Take 1 tablet by mouth every 6 (six) hours as needed for pain.     Insulin Glargine 100 UNIT/ML Solostar Pen  Commonly known as:  LANTUS  Inject 30 units every day at bedtime     insulin lispro 100 UNIT/ML KiwkPen  Commonly known as:  HUMALOG KWIKPEN  Inject 8 units at breakfast and lunch and 16 units at dinner     losartan-hydrochlorothiazide 100-25 MG per tablet  Commonly known as:  HYZAAR  Take 1 tablet by mouth at bedtime.     meloxicam 15 MG tablet  Commonly known as:  MOBIC  Take 15 mg by mouth daily.     metFORMIN 1000 MG tablet  Commonly known as:   GLUCOPHAGE  Take 1,000 mg by mouth daily.     niacin 500 MG CR tablet  Commonly known as:  NIASPAN  Take 1,000 mg by mouth at bedtime.     simvastatin 40 MG tablet  Commonly known as:  ZOCOR  Take 1 tablet (40 mg total) by mouth every evening.     tamsulosin 0.4 MG Caps capsule  Commonly known as:  FLOMAX  Take 1 capsule (0.4 mg total) by mouth daily.     Vitamin D (Ergocalciferol) 50000 UNITS Caps capsule  Commonly known as:  DRISDOL  Take 50,000 Units by mouth every Sunday.        Allergies: No Known Allergies  Past Medical History  Diagnosis Date  . Diabetes mellitus without complication   . Hypertension   . Stroke   . Chronic kidney disease     kidney stone    Past Surgical History  Procedure Laterality Date  . No past surgeries    . Hand surgery      No family history on file.  Social History:  reports that she has quit smoking. She has never used smokeless tobacco. She reports that she does not drink alcohol or use illicit drugs.  Review of Systems:  HYPERTENSION:  controlled on  losartan HCTZ and amlodipine, also followed by PCP  HYPERLIPIDEMIA: The lipid abnormality consists of elevated LDL Well controlled with simvastatin and niacin  Lab Results  Component Value Date   CHOL 152 03/06/2013   HDL 54.90 03/06/2013   LDLCALC 85 03/06/2013   TRIG 63.0 03/06/2013   CHOLHDL 3 03/06/2013    No recent numbness or tingling in her feet       Examination:   BP 142/80  Pulse 83  Temp(Src) 98.2 F (36.8 C)  Resp 16  Ht 5' 7.5" (1.715 m)  Wt 202 lb 3.2 oz (91.717 kg)  BMI 31.18 kg/m2  SpO2 98%  Body mass index is 31.18 kg/(m^2).   No ankle edema  ASSESSMENT/ PLAN::   Diabetes type 2  The patient's diabetes control appears to be  fairly good as judged by her home readings but lab glucose was 160  Unable to get report of her fructosamine today She has not checked her blood sugars consistently after meals Also not exercising Because her husband illness  has not been able to plan her meals consistently either   Recommendations:  She will start using the new FreeStyle meter given to her  Checked readings after meals consistently and call if persistently high at any time with her new monitor  Check A1c on the next visit  Resume exercise when she can  [facility] 05/12/2013, 8:45 AM

## 2013-06-02 NOTE — Telephone Encounter (Signed)
Error

## 2013-08-08 ENCOUNTER — Other Ambulatory Visit: Payer: Self-pay | Admitting: *Deleted

## 2013-08-08 ENCOUNTER — Other Ambulatory Visit: Payer: PRIVATE HEALTH INSURANCE

## 2013-08-08 ENCOUNTER — Telehealth: Payer: Self-pay | Admitting: Endocrinology

## 2013-08-08 MED ORDER — GLUCOSE BLOOD VI STRP: ORAL_STRIP | Status: DC

## 2013-08-08 NOTE — Telephone Encounter (Signed)
Patient called stating she needs a refill on her test strips   Freestyle meter   Thank You :)

## 2013-08-08 NOTE — Telephone Encounter (Signed)
Pt needs the refills for test sttrips called into [facility] at [place] she is completely out to hold her over until she can get the ones from [facility]

## 2013-08-11 ENCOUNTER — Ambulatory Visit: Payer: PRIVATE HEALTH INSURANCE | Admitting: Endocrinology

## 2013-09-02 ENCOUNTER — Other Ambulatory Visit: Payer: Self-pay | Admitting: *Deleted

## 2013-09-02 ENCOUNTER — Other Ambulatory Visit (INDEPENDENT_AMBULATORY_CARE_PROVIDER_SITE_OTHER): Payer: Medicare PPO

## 2013-09-02 DIAGNOSIS — E119 Type 2 diabetes mellitus without complications: Secondary | ICD-10-CM

## 2013-09-02 LAB — COMPREHENSIVE METABOLIC PANEL
ALBUMIN: 4 g/dL (ref 3.5–5.2)
ALT: 18 U/L (ref 0–35)
AST: 20 U/L (ref 0–37)
Alkaline Phosphatase: 46 U/L (ref 39–117)
BUN: 14 mg/dL (ref 6–23)
CALCIUM: 9.8 mg/dL (ref 8.4–10.5)
CHLORIDE: 108 meq/L (ref 96–112)
CO2: 27 mEq/L (ref 19–32)
Creatinine, Ser: 0.8 mg/dL (ref 0.4–1.2)
GFR: 96.98 mL/min (ref >60.00)
GLUCOSE: 79 mg/dL (ref 70–99)
POTASSIUM: 3.9 meq/L (ref 3.5–5.1)
Sodium: 140 mEq/L (ref 135–145)
TOTAL PROTEIN: 6.7 g/dL (ref 6.0–8.3)
Total Bilirubin: 0.5 mg/dL (ref 0.2–1.2)

## 2013-09-02 LAB — MICROALBUMIN / CREATININE URINE RATIO
CREATININE, U: 590.2 mg/dL
Microalb Creat Ratio: 0.5 mg/g (ref 0.0–30.0)
Microalb, Ur: 3.2 mg/dL — ABNORMAL HIGH (ref 0.0–1.9)

## 2013-09-02 LAB — HEMOGLOBIN A1C: Hgb A1c MFr Bld: 9.4 % — ABNORMAL HIGH (ref 4.6–6.5)

## 2013-09-02 MED ORDER — GLUCOSE BLOOD VI STRP: ORAL_STRIP | Status: DC

## 2013-09-04 ENCOUNTER — Ambulatory Visit (INDEPENDENT_AMBULATORY_CARE_PROVIDER_SITE_OTHER): Payer: Medicare PPO | Admitting: Endocrinology

## 2013-09-04 ENCOUNTER — Encounter: Payer: Self-pay | Admitting: Endocrinology

## 2013-09-04 VITALS — BP 151/69 | HR 68 | Temp 97.9°F | Resp 16 | Ht 67.75 in | Wt 200.4 lb

## 2013-09-04 DIAGNOSIS — I10 Essential (primary) hypertension: Secondary | ICD-10-CM

## 2013-09-04 DIAGNOSIS — E1165 Type 2 diabetes mellitus with hyperglycemia: Principal | ICD-10-CM

## 2013-09-04 DIAGNOSIS — IMO0001 Reserved for inherently not codable concepts without codable children: Secondary | ICD-10-CM

## 2013-09-04 MED ORDER — VICTOZA 18 MG/3ML ~~LOC~~ SOPN: 1.2000 mg | PEN_INJECTOR | Freq: Every day | SUBCUTANEOUS | Status: DC

## 2013-09-04 NOTE — Progress Notes (Signed)
Patient ID: Sandra Washington, female   DOB: 10-Jun-1940, 73 y.o.   MRN: [id number]   Reason for Appointment: Diabetes follow-up   History of Present Illness   Diagnosis: Type 2 DIABETES MELITUS, date of diagnosis:   1995       She has been on insulin almost since her diagnosis and has had various regimens over the years More recently has been on basal bolus insulin with Lantus and Humalog with variable control, depending on her compliance with diet and exercise She has generally been having difficulty with consistent compliance in the last few months because of taking care of her husband  Recent history: Her A1c is much higher than usual even though she thinks she is been compliant with her insulin Generally not watching her diet since she is eating out a lot and she thinks blood sugar may be higher because of this However has not gained weight Also on metformin Now her A1c was slightly higher than usual at 9.4%. She did not become monitor for download and not clear how often she is having hyperglycemia Blood sugars by recall appear to be higher in the afternoon and probably after supper also She has not adjusted her mealtime insulin when eating a larger more higher fat meals She thinks fasting glucose is normal and her fasting lab glucose was 79 She has never been on a GLP-1 drug Oral hypoglycemic drugs:  metformin       Side effects from medications: None Insulin regimen: 22 Lantus hs . Humalog 10-05-14 ac tid           Proper timing of medications in relation to meals: Yes.         Monitors blood glucose: Once a day.    Glucometer:  FreeStyle           Blood Glucose readings: From recall: Fasting up to 120, nonfasting variable, generally highest before supper up to 200, may be higher at lunch, variable after supper  Hypoglycemia: None         Meals: 3 meals per day. eating out with taking care of her husband       Physical activity: exercise: back recently, is doing water  aerobics or walking        Dietician visit: Most recent: 04/5454         Complications: are: minimal      Wt Readings from Last 3 Encounters:  09/04/13 200 lb 6.4 oz (90.901 kg)  05/12/13 202 lb 3.2 oz (91.717 kg)  03/10/13 200 lb 14.4 oz (91.128 kg)    Lab Results  Component Value Date   HGBA1C 9.4* 09/02/2013   HGBA1C 7.5* 03/06/2013   HGBA1C 7.1* 11/01/2012   Lab Results  Component Value Date   MICROALBUR 3.2* 09/02/2013   LDLCALC 85 03/06/2013   CREATININE 0.8 09/02/2013     Appointment on 09/02/2013  Component Date Value Ref Range Status  . Hemoglobin A1C 09/02/2013 9.4* 4.6 - 6.5 % Final   Glycemic Control Guidelines for People with Diabetes:Non Diabetic:  <6%Goal of Therapy: <7%Additional Action Suggested:  >8%   . Sodium 09/02/2013 140  135 - 145 mEq/L Final  . Potassium 09/02/2013 3.9  3.5 - 5.1 mEq/L Final  . Chloride 09/02/2013 108  96 - 112 mEq/L Final  . CO2 09/02/2013 27  19 - 32 mEq/L Final  . Glucose, Bld 09/02/2013 79  70 - 99 mg/dL Final  . BUN 09/02/2013 14  6 - 23 mg/dL Final  .  Creatinine, Ser 09/02/2013 0.8  0.4 - 1.2 mg/dL Final  . Total Bilirubin 09/02/2013 0.5  0.2 - 1.2 mg/dL Final  . Alkaline Phosphatase 09/02/2013 46  39 - 117 U/L Final  . AST 09/02/2013 20  0 - 37 U/L Final  . ALT 09/02/2013 18  0 - 35 U/L Final  . Total Protein 09/02/2013 6.7  6.0 - 8.3 g/dL Final  . Albumin 09/02/2013 4.0  3.5 - 5.2 g/dL Final  . Calcium 09/02/2013 9.8  8.4 - 10.5 mg/dL Final  . GFR 09/02/2013 96.98  >60.00 mL/min Final  . Microalb, Ur 09/02/2013 3.2* 0.0 - 1.9 mg/dL Final  . Creatinine,U 09/02/2013 590.2   Final  . Microalb Creat Ratio 09/02/2013 0.5  0.0 - 30.0 mg/g Final      Medication List       This list is accurate as of: 09/04/13 11:08 AM.  Always use your most recent med list.               ALPRAZolam 0.5 MG tablet  Commonly known as:  XANAX  Take 0.5 mg by mouth daily as needed for anxiety.     amLODipine 10 MG tablet  Commonly known as:   NORVASC  Take 10 mg by mouth daily.     aspirin EC 81 MG tablet  Take 81 mg by mouth daily.     bisacodyl 5 MG EC tablet  Commonly known as:  DULCOLAX  Take 5 mg by mouth daily.     cetirizine 10 MG tablet  Commonly known as:  ZYRTEC  Take 10 mg by mouth daily as needed for allergies.     cyclobenzaprine 5 MG tablet  Commonly known as:  FLEXERIL     glucose blood test strip  Commonly known as:  ONETOUCH VERIO  Use as instructed to check blood sugar 4 times per day dx code 250.00     HYDROcodone-acetaminophen 10-325 MG per tablet  Commonly known as:  NORCO  Take 1 tablet by mouth every 6 (six) hours as needed for pain.     Insulin Glargine 100 UNIT/ML Solostar Pen  Commonly known as:  LANTUS  22 Units. Inject  units every day at bedtime     insulin lispro 100 UNIT/ML KiwkPen  Commonly known as:  HUMALOG KWIKPEN  Inject 8 units at breakfast and lunch and 16 units at dinner     losartan-hydrochlorothiazide 100-25 MG per tablet  Commonly known as:  HYZAAR  Take 1 tablet by mouth at bedtime.     meloxicam 15 MG tablet  Commonly known as:  MOBIC  Take 15 mg by mouth daily.     metFORMIN 1000 MG tablet  Commonly known as:  GLUCOPHAGE  Take 1,000 mg by mouth daily.     niacin 500 MG CR tablet  Commonly known as:  NIASPAN  Take 1,000 mg by mouth at bedtime.     simvastatin 40 MG tablet  Commonly known as:  ZOCOR  Take 1 tablet (40 mg total) by mouth every evening.     tamsulosin 0.4 MG Caps capsule  Commonly known as:  FLOMAX  Take 1 capsule (0.4 mg total) by mouth daily.     Vitamin D (Ergocalciferol) 50000 UNITS Caps capsule  Commonly known as:  DRISDOL  Take 50,000 Units by mouth every Sunday.        Allergies: No Known Allergies  Past Medical History  Diagnosis Date  . Diabetes mellitus without complication   . Hypertension   .  Stroke   . Chronic kidney disease     kidney stone    Past Surgical History  Procedure Laterality Date  . No past  surgeries    . Hand surgery      No family history on file.  Social History:  reports that she has quit smoking. She has never used smokeless tobacco. She reports that she does not drink alcohol or use illicit drugs.  Review of Systems:  HYPERTENSION:  she is on  losartan HCTZ and amlodipine, also followed by PCP. Blood pressure appears higher today  HYPERLIPIDEMIA: The lipid abnormality consists of elevated LDL Well controlled with simvastatin and niacin  Lab Results  Component Value Date   CHOL 152 03/06/2013   HDL 54.90 03/06/2013   LDLCALC 85 03/06/2013   TRIG 63.0 03/06/2013   CHOLHDL 3 03/06/2013    No recent numbness or tingling in her feet, no edema       Examination:   BP 151/69  Pulse 68  Temp(Src) 97.9 F (36.6 C)  Resp 16  Ht 5' 7.75" (1.721 m)  Wt 200 lb 6.4 oz (90.901 kg)  BMI 30.69 kg/m2  SpO2 98%  Body mass index is 30.69 kg/(m^2).   Not indicated  ASSESSMENT/ PLAN:   Diabetes type 2  The patient's diabetes control appears to be much worse with A1c now over 9% compared to close to 7% usually She did not bring her monitor for review today  See history of present illness for review of her current management, problems identified  She has not checked her blood sugars consistently after meals but she thinks blood sugars are higher after lunch Also not exercising Because her husband illness has not been able to plan her meals consistently blood sugars are higher with eating out a lot She is interested in trying a GLP-1 drug instead of increasing her insulin especially mealtime since she is not able to estimate how much she needs for various meals especially when eating out  Discussed with the patient the nature of GLP-1 drugs, the action on various organ systems, how they benefit blood glucose control, as well as the benefit of weight loss and  increase satiety . Explained possible side effects especially nausea and vomiting; discussed safety information in package  insert. Described injection technique and dosage titration of Victoza  starting with 0.6 mg once a day at the same time for the first week and then increasing to 1.2 mg if no symptoms of nausea. Patient starter kit on Victoza  given  Recommendations as in patient instructions May need to adjust her insulin with starting the Victoza and most likely will need 2-4 units more of all her insulin doses Also needs more consistent exercise Consider followup with diabetes educator also  Hypertension: Blood pressure is relatively higher today, generally followed by PCP but she also can check at home   Patient Instructions  Lantus 18 units in am only  Take Humalog 08-02-12 right at the meal times   Start VICTOZA injection with the pen once daily at the same time of the day.  Dial the dose to 0.6 mg for the first 5-7 days.  You may possibly experience nausea in the first few days which usually gets better in a couple of days After 1 week increase the dose to 1.77m daily if no nausea present.  You may inject in the stomach, thigh or arm.   You will feel fullness of the stomach with starting the medication and should  try to keep portions of food small.  Call us or the [facility] helpline at [phone] or visit [email or web address] for any questions  Please check blood sugars at least half the time about 2 hours after any meal and 5 times per week on waking up. Please bring blood sugar monitor to each visit  Check BP weekly   Counseling time over 50% of today's 25 minute visit  Hattie Pine 09/04/2013, 11:08 AM

## 2013-09-04 NOTE — Patient Instructions (Addendum)
Lantus 18 units in am only  Take Humalog 08-02-12 right at the meal times   Start VICTOZA injection with the pen once daily at the same time of the day.  Dial the dose to 0.6 mg for the first 5-7 days.  You may possibly experience nausea in the first few days which usually gets better in a couple of days After 1 week increase the dose to 1.2mg  daily if no nausea present.  You may inject in the stomach, thigh or arm.   You will feel fullness of the stomach with starting the medication and should try to keep portions of food small.  Call us or the [place] helpline at [phone] or visit [email or web address] for any questions  Please check blood sugars at least half the time about 2 hours after any meal and 5 times per week on waking up. Please bring blood sugar monitor to each visit  Check BP weekly

## 2013-09-18 ENCOUNTER — Other Ambulatory Visit (HOSPITAL_COMMUNITY)
Admission: RE | Admit: 2013-09-18 | Discharge: 2013-09-18 | Disposition: A | Discharge status: Home / Self Care | Payer: Medicare PPO | Source: Ambulatory Visit | Attending: Family Medicine | Admitting: Family Medicine

## 2013-09-18 ENCOUNTER — Other Ambulatory Visit: Payer: Self-pay | Admitting: Family Medicine

## 2013-09-18 DIAGNOSIS — Z1151 Encounter for screening for human papillomavirus (HPV): Secondary | ICD-10-CM | POA: Diagnosis present

## 2013-09-18 DIAGNOSIS — Z124 Encounter for screening for malignant neoplasm of cervix: Secondary | ICD-10-CM | POA: Diagnosis present

## 2013-09-18 DIAGNOSIS — N760 Acute vaginitis: Secondary | ICD-10-CM | POA: Diagnosis present

## 2013-09-18 DIAGNOSIS — Z113 Encounter for screening for infections with a predominantly sexual mode of transmission: Secondary | ICD-10-CM | POA: Diagnosis present

## 2013-09-19 LAB — CYTOLOGY - PAP

## 2013-09-29 ENCOUNTER — Other Ambulatory Visit (INDEPENDENT_AMBULATORY_CARE_PROVIDER_SITE_OTHER): Payer: Medicare PPO

## 2013-09-29 DIAGNOSIS — E1165 Type 2 diabetes mellitus with hyperglycemia: Principal | ICD-10-CM

## 2013-09-29 DIAGNOSIS — IMO0001 Reserved for inherently not codable concepts without codable children: Secondary | ICD-10-CM

## 2013-09-29 LAB — GLUCOSE, RANDOM: Glucose, Bld: 172 mg/dL — ABNORMAL HIGH (ref 70–99)

## 2013-10-01 LAB — FRUCTOSAMINE: Fructosamine: 337 umol/L — ABNORMAL HIGH (ref 190–270)

## 2013-10-02 ENCOUNTER — Encounter: Payer: Self-pay | Admitting: Endocrinology

## 2013-10-02 ENCOUNTER — Ambulatory Visit (INDEPENDENT_AMBULATORY_CARE_PROVIDER_SITE_OTHER): Payer: Medicare PPO | Admitting: Endocrinology

## 2013-10-02 VITALS — BP 137/60 | HR 69 | Temp 98.3°F | Resp 16 | Ht 67.75 in | Wt 198.8 lb

## 2013-10-02 DIAGNOSIS — E1165 Type 2 diabetes mellitus with hyperglycemia: Principal | ICD-10-CM

## 2013-10-02 DIAGNOSIS — IMO0001 Reserved for inherently not codable concepts without codable children: Secondary | ICD-10-CM

## 2013-10-02 NOTE — Patient Instructions (Addendum)
Please check blood sugars at least half the time about 2 hours after any meal and 3 times per week on waking up. Please bring blood sugar monitor to each visit  If sugar after supper is mostly > 170-180, go up 2 for supper dose  Take 1/2 tab meloxicam as needed only

## 2013-10-02 NOTE — Progress Notes (Signed)
Patient ID: CLIFFORD COUDRIET, female   DOB: February 25, 1941, 73 y.o.   MRN: [id number]   Reason for Appointment: Diabetes follow-up   History of Present Illness   Diagnosis: Type 2 DIABETES MELITUS, date of diagnosis:   1995       She has been on insulin almost since her diagnosis and has had various regimens over the years More recently has been on basal bolus insulin with Lantus and Humalog with variable control, depending on her compliance with diet and exercise She has generally been having difficulty with consistent compliance in the last few months because of taking care of her husband  Recent history: Because her A1c was much higher than usual she was started on Victoza on her last visit in 7/15 She has titrated this up to 1.2 mg and has had no recent nausea She thinks she can control her portions better with this She is still eating out and not cooking at home much and still has difficulty losing weight She continues to be compliant with her basal bolus insulin regimen Since she is checking readings only in the morning difficult to know if her postprandial readings are any better Also has not had any hypoglycemia  Oral hypoglycemic drugs:  metformin       Side effects from medications: None Insulin regimen: 22 Lantus hs . Humalog 10-05-14 ac tid           Proper timing of medications in relation to meals: Yes.         Monitors blood glucose: Once a day.    Glucometer:  One Touch           Blood Glucose readings:   PREMEAL Breakfast Lunch Dinner  PCS  Overall  Glucose range:  86-154   78   119, 123   134    Mean/median:  113      119    Hypoglycemia: None         Meals: 3 meals per day. Bfst: Cereal or egg; eating out usually sandwiches       Physical activity: exercise:  is doing water aerobics      Dietician visit: Most recent: 03/6832         Complications: are: minimal      Wt Readings from Last 3 Encounters:  10/02/13 198 lb 12.8 oz (90.175 kg)  09/04/13 200 lb 6.4  oz (90.901 kg)  05/12/13 202 lb 3.2 oz (91.717 kg)    Lab Results  Component Value Date   HGBA1C 9.4* 09/02/2013   HGBA1C 7.5* 03/06/2013   HGBA1C 7.1* 11/01/2012   Lab Results  Component Value Date   MICROALBUR 3.2* 09/02/2013   LDLCALC 85 03/06/2013   CREATININE 0.8 09/02/2013     Appointment on 09/29/2013  Component Date Value Ref Range Status  . Fructosamine 09/29/2013 337* 190 - 270 umol/L Final  . Glucose, Bld 09/29/2013 172* 70 - 99 mg/dL Final      Medication List       This list is accurate as of: 10/02/13  9:32 PM.  Always use your most recent med list.               ALPRAZolam 0.5 MG tablet  Commonly known as:  XANAX  Take 0.5 mg by mouth daily as needed for anxiety.     amLODipine 10 MG tablet  Commonly known as:  NORVASC  Take 10 mg by mouth daily.     aspirin EC 81 MG tablet  Take 81 mg by mouth daily.     bisacodyl 5 MG EC tablet  Commonly known as:  DULCOLAX  Take 5 mg by mouth daily.     cetirizine 10 MG tablet  Commonly known as:  ZYRTEC  Take 10 mg by mouth daily as needed for allergies.     cyclobenzaprine 5 MG tablet  Commonly known as:  FLEXERIL     glucose blood test strip  Commonly known as:  ONETOUCH VERIO  Use as instructed to check blood sugar 4 times per day dx code 250.00     HYDROcodone-acetaminophen 10-325 MG per tablet  Commonly known as:  NORCO  Take 1 tablet by mouth every 6 (six) hours as needed for pain.     Insulin Glargine 100 UNIT/ML Solostar Pen  Commonly known as:  LANTUS  22 Units. Inject  units every day at bedtime     insulin lispro 100 UNIT/ML KiwkPen  Commonly known as:  HUMALOG KWIKPEN  Inject 8 units at breakfast and lunch and 16 units at dinner     losartan-hydrochlorothiazide 100-25 MG per tablet  Commonly known as:  HYZAAR  Take 1 tablet by mouth at bedtime.     meloxicam 15 MG tablet  Commonly known as:  MOBIC  Take 15 mg by mouth daily.     metFORMIN 1000 MG tablet  Commonly known as:  GLUCOPHAGE   Take 1,000 mg by mouth daily.     niacin 500 MG CR tablet  Commonly known as:  NIASPAN  Take 1,000 mg by mouth at bedtime.     simvastatin 40 MG tablet  Commonly known as:  ZOCOR  Take 1 tablet (40 mg total) by mouth every evening.     VICTOZA 18 MG/3ML Sopn  Generic drug:  Liraglutide  Inject 1.2 mg into the skin daily. Inject once daily at the same time     Vitamin D (Ergocalciferol) 50000 UNITS Caps capsule  Commonly known as:  DRISDOL  Take 50,000 Units by mouth every Sunday.        Allergies: No Known Allergies  Past Medical History  Diagnosis Date  . Diabetes mellitus without complication   . Hypertension   . Stroke   . Chronic kidney disease     kidney stone    Past Surgical History  Procedure Laterality Date  . No past surgeries    . Hand surgery      No family history on file.  Social History:  reports that she has quit smoking. She has never used smokeless tobacco. She reports that she does not drink alcohol or use illicit drugs.  Review of Systems:  HYPERTENSION:  she is on  losartan HCTZ and amlodipine, also followed by PCP. Blood pressure is good today  HYPERLIPIDEMIA: The lipid abnormality consists of elevated LDL Well controlled with simvastatin and niacin  Lab Results  Component Value Date   CHOL 152 03/06/2013   HDL 54.90 03/06/2013   LDLCALC 85 03/06/2013   TRIG 63.0 03/06/2013   CHOLHDL 3 03/06/2013         Examination:   BP 137/60  Pulse 69  Temp(Src) 98.3 F (36.8 C)  Resp 16  Ht 5' 7.75" (1.721 m)  Wt 198 lb 12.8 oz (90.175 kg)  BMI 30.45 kg/m2  SpO2 99%  Body mass index is 30.45 kg/(m^2).   Not indicated  ASSESSMENT/ PLAN:   Diabetes type 2  The patient's diabetes control appears to be fairly good with starting Victoza  Her A1c was probably high on her last visit because of high postprandial readings She however has not checked readings after meals except once or twice and not clear if control is any better Fructosamine is  still high She is however able to watch her diet with portion control better with Victoza Discussed monitoring readings after meals at least half the time and advice her on adjusting her mealtime dose based on two-hour postprandial targets  Low back pain: Better my advice her to take meloxicam only as needed instead of daily   Patient Instructions  Please check blood sugars at least half the time about 2 hours after any meal and 3 times per week on waking up. Please bring blood sugar monitor to each visit  If sugar after supper is mostly > 170-180, go up 2 for supper dose  Take 1/2 tab as needed only    Alexee Delsanto 10/02/2013, 9:32 PM

## 2013-10-22 ENCOUNTER — Other Ambulatory Visit: Payer: Self-pay | Admitting: *Deleted

## 2013-10-22 ENCOUNTER — Telehealth: Payer: Self-pay | Admitting: Endocrinology

## 2013-10-22 MED ORDER — INSULIN NPH (HUMAN) (ISOPHANE) 100 UNIT/ML ~~LOC~~ SUSP: SUBCUTANEOUS | Status: DC

## 2013-10-22 MED ORDER — INSULIN REGULAR HUMAN 100 UNIT/ML IJ SOLN: INTRAMUSCULAR | Status: DC

## 2013-10-22 NOTE — Telephone Encounter (Signed)
Noted, rx sent, patient is aware. 

## 2013-10-22 NOTE — Telephone Encounter (Signed)
Patient can't afford medication Humalog and Lantus, insurance will not cover, Is there something else she can do.  Please advise

## 2013-10-22 NOTE — Telephone Encounter (Signed)
Change Lantus to NPH insulin 12 units in the morning and 10 units at bedtime, Change Humalog to regular insulin, same doses before meals. She will have to use syringes for the new insulins

## 2013-10-22 NOTE — Telephone Encounter (Signed)
Please see below and advise.

## 2013-10-27 ENCOUNTER — Telehealth: Payer: Self-pay | Admitting: Endocrinology

## 2013-10-27 NOTE — Telephone Encounter (Signed)
humalog and lantus costs $180 per month//too much please advise on an alternative  The victoza is ok and she picked this up $45.

## 2013-11-28 ENCOUNTER — Other Ambulatory Visit: Payer: Medicare PPO

## 2013-12-01 ENCOUNTER — Telehealth: Payer: Self-pay | Admitting: Medical Oncology

## 2013-12-01 NOTE — Telephone Encounter (Signed)
erron

## 2013-12-02 ENCOUNTER — Ambulatory Visit: Payer: Medicare PPO | Admitting: Endocrinology

## 2014-03-26 DIAGNOSIS — Z0000 Encounter for general adult medical examination without abnormal findings: Secondary | ICD-10-CM | POA: Diagnosis not present

## 2014-03-26 DIAGNOSIS — E0800 Diabetes mellitus due to underlying condition with hyperosmolarity without nonketotic hyperglycemic-hyperosmolar coma (NKHHC): Secondary | ICD-10-CM | POA: Diagnosis not present

## 2014-03-26 DIAGNOSIS — I10 Essential (primary) hypertension: Secondary | ICD-10-CM | POA: Diagnosis not present

## 2014-04-09 ENCOUNTER — Other Ambulatory Visit: Payer: Self-pay | Admitting: Endocrinology

## 2014-05-27 DIAGNOSIS — M2392 Unspecified internal derangement of left knee: Secondary | ICD-10-CM | POA: Diagnosis not present

## 2014-06-03 ENCOUNTER — Other Ambulatory Visit: Payer: Self-pay | Admitting: Endocrinology

## 2014-07-08 DIAGNOSIS — I10 Essential (primary) hypertension: Secondary | ICD-10-CM | POA: Diagnosis not present

## 2014-07-08 DIAGNOSIS — R7309 Other abnormal glucose: Secondary | ICD-10-CM | POA: Diagnosis not present

## 2014-07-08 DIAGNOSIS — S40861A Insect bite (nonvenomous) of right upper arm, initial encounter: Secondary | ICD-10-CM | POA: Diagnosis not present

## 2014-07-17 DIAGNOSIS — K649 Unspecified hemorrhoids: Secondary | ICD-10-CM | POA: Diagnosis not present

## 2014-07-28 NOTE — Progress Notes (Deleted)
Subjective:     Patient ID: Sandra Washington, female   DOB: 01-14-1941, 74 y.o.   MRN: [id number]  HPI   Review of Systems     Objective:   Physical Exam     Assessment:     ***    Plan:     ***

## 2014-08-07 DIAGNOSIS — S40861A Insect bite (nonvenomous) of right upper arm, initial encounter: Secondary | ICD-10-CM | POA: Diagnosis not present

## 2014-09-08 DIAGNOSIS — H5210 Myopia, unspecified eye: Secondary | ICD-10-CM | POA: Diagnosis not present

## 2014-09-08 DIAGNOSIS — I10 Essential (primary) hypertension: Secondary | ICD-10-CM | POA: Diagnosis not present

## 2014-09-08 DIAGNOSIS — E109 Type 1 diabetes mellitus without complications: Secondary | ICD-10-CM | POA: Diagnosis not present

## 2014-09-08 DIAGNOSIS — H2510 Age-related nuclear cataract, unspecified eye: Secondary | ICD-10-CM | POA: Diagnosis not present

## 2014-09-08 DIAGNOSIS — H5200 Hypermetropia, unspecified eye: Secondary | ICD-10-CM | POA: Diagnosis not present

## 2014-09-08 LAB — HM DIABETES EYE EXAM

## 2014-10-07 ENCOUNTER — Other Ambulatory Visit: Payer: Self-pay | Admitting: Endocrinology

## 2014-10-09 DIAGNOSIS — H2511 Age-related nuclear cataract, right eye: Secondary | ICD-10-CM | POA: Diagnosis not present

## 2014-10-09 DIAGNOSIS — H2513 Age-related nuclear cataract, bilateral: Secondary | ICD-10-CM | POA: Diagnosis not present

## 2014-10-09 DIAGNOSIS — H40033 Anatomical narrow angle, bilateral: Secondary | ICD-10-CM | POA: Diagnosis not present

## 2014-10-09 DIAGNOSIS — H40031 Anatomical narrow angle, right eye: Secondary | ICD-10-CM | POA: Diagnosis not present

## 2014-10-09 DIAGNOSIS — H02839 Dermatochalasis of unspecified eye, unspecified eyelid: Secondary | ICD-10-CM | POA: Diagnosis not present

## 2014-10-09 DIAGNOSIS — H18411 Arcus senilis, right eye: Secondary | ICD-10-CM | POA: Diagnosis not present

## 2014-10-19 DIAGNOSIS — H25811 Combined forms of age-related cataract, right eye: Secondary | ICD-10-CM | POA: Diagnosis not present

## 2014-10-19 DIAGNOSIS — Z961 Presence of intraocular lens: Secondary | ICD-10-CM | POA: Diagnosis not present

## 2014-10-20 DIAGNOSIS — H40032 Anatomical narrow angle, left eye: Secondary | ICD-10-CM | POA: Diagnosis not present

## 2014-10-27 ENCOUNTER — Other Ambulatory Visit: Payer: Self-pay

## 2014-10-27 ENCOUNTER — Other Ambulatory Visit: Payer: Self-pay | Admitting: *Deleted

## 2014-10-27 ENCOUNTER — Other Ambulatory Visit (INDEPENDENT_AMBULATORY_CARE_PROVIDER_SITE_OTHER): Payer: Commercial Managed Care - HMO

## 2014-10-27 DIAGNOSIS — E1165 Type 2 diabetes mellitus with hyperglycemia: Secondary | ICD-10-CM | POA: Diagnosis not present

## 2014-10-27 DIAGNOSIS — Z1231 Encounter for screening mammogram for malignant neoplasm of breast: Secondary | ICD-10-CM

## 2014-10-27 DIAGNOSIS — IMO0002 Reserved for concepts with insufficient information to code with codable children: Secondary | ICD-10-CM

## 2014-10-27 LAB — BASIC METABOLIC PANEL
BUN: 13 mg/dL (ref 6–23)
CALCIUM: 9.6 mg/dL (ref 8.4–10.5)
CO2: 27 meq/L (ref 19–32)
CREATININE: 0.68 mg/dL (ref 0.40–1.20)
Chloride: 106 mEq/L (ref 96–112)
GFR: 108.25 mL/min (ref >60.00)
GLUCOSE: 222 mg/dL — AB (ref 70–99)
Potassium: 4.3 mEq/L (ref 3.5–5.1)
SODIUM: 140 meq/L (ref 135–145)

## 2014-10-27 LAB — HEMOGLOBIN A1C: Hgb A1c MFr Bld: 8.3 % — ABNORMAL HIGH (ref 4.6–6.5)

## 2014-10-29 ENCOUNTER — Ambulatory Visit
Admission: RE | Admit: 2014-10-29 | Discharge: 2014-10-29 | Disposition: A | Discharge status: Home / Self Care | Payer: Commercial Managed Care - HMO | Source: Ambulatory Visit

## 2014-10-29 DIAGNOSIS — Z1231 Encounter for screening mammogram for malignant neoplasm of breast: Secondary | ICD-10-CM | POA: Diagnosis not present

## 2014-10-30 ENCOUNTER — Encounter: Payer: Self-pay | Admitting: Endocrinology

## 2014-10-30 ENCOUNTER — Ambulatory Visit (INDEPENDENT_AMBULATORY_CARE_PROVIDER_SITE_OTHER): Payer: Commercial Managed Care - HMO | Admitting: Endocrinology

## 2014-10-30 ENCOUNTER — Other Ambulatory Visit: Payer: Self-pay | Admitting: *Deleted

## 2014-10-30 ENCOUNTER — Encounter: Payer: Self-pay | Admitting: *Deleted

## 2014-10-30 VITALS — BP 118/70 | HR 75 | Temp 97.9°F | Resp 16 | Ht 67.75 in | Wt 199.2 lb

## 2014-10-30 DIAGNOSIS — E1165 Type 2 diabetes mellitus with hyperglycemia: Secondary | ICD-10-CM | POA: Diagnosis not present

## 2014-10-30 DIAGNOSIS — IMO0002 Reserved for concepts with insufficient information to code with codable children: Secondary | ICD-10-CM

## 2014-10-30 DIAGNOSIS — E785 Hyperlipidemia, unspecified: Secondary | ICD-10-CM

## 2014-10-30 DIAGNOSIS — I10 Essential (primary) hypertension: Secondary | ICD-10-CM | POA: Diagnosis not present

## 2014-10-30 MED ORDER — INSULIN NPH (HUMAN) (ISOPHANE) 100 UNIT/ML ~~LOC~~ SUSP: SUBCUTANEOUS | Status: DC

## 2014-10-30 MED ORDER — METFORMIN HCL 1000 MG PO TABS: 500.0000 mg | ORAL_TABLET | Freq: Every day | ORAL | Status: DC

## 2014-10-30 MED ORDER — METFORMIN HCL 500 MG PO TABS: ORAL_TABLET | ORAL | Status: DC

## 2014-10-30 MED ORDER — INSULIN LISPRO 100 UNIT/ML (KWIKPEN)
PEN_INJECTOR | SUBCUTANEOUS | Status: DC
Start: 2014-10-30 — End: 2016-03-30

## 2014-10-30 MED ORDER — ATORVASTATIN CALCIUM 10 MG PO TABS: 10.0000 mg | ORAL_TABLET | Freq: Every day | ORAL | Status: DC

## 2014-10-30 NOTE — Patient Instructions (Addendum)
Check blood sugars on waking up .Marland Kitchen3-4  .Marland Kitchen times a week Also check blood sugars about 2 hours after a meal and do this after different meals by rotation Recommended blood sugar levels on waking up is 90-130 and about 2 hours after meal is 140-180 Please bring blood sugar monitor to each visit.  Change N insulin to 12 units at bedtime and 8 in am  Metformin 1 in am and 3 at supper  Only small amount of fruit with 1 or 2 meals a day  Start Liptor 10 mg daily  Niacin 1 in at dinner only

## 2014-10-30 NOTE — Progress Notes (Signed)
Patient ID: Sandra Washington, female   DOB: 1940/04/08, 74 y.o.   MRN: [id number]   Reason for Appointment: Diabetes follow-up   History of Present Illness   Diagnosis: Type 2 DIABETES MELITUS, date of diagnosis:   1995       She has been on insulin almost since her diagnosis and has had various regimens over the years More recently has been on basal bolus insulin with Lantus and Humalog with variable control, depending on her compliance with diet and exercise She has generally been having difficulty with consistent compliance in the last few months because of taking care of her husband  Recent history:  Insulin regimen:  Novolin N 12--10; Humalog 10-05-14 ac tid  She has not been seen in follow-up for over a year for various reasons  She had previously been tried on Victoza because of poor control but she could not continue this because of cost Also she had called to change her Lantus to NPH because of high out-of-pocket expense She has not followed up after these changes  She tends to have a persistently high A1c and relatively higher than expected for her blood sugars Current blood sugar patterns and problems:  She is testing her blood sugar mostly in the mornings and although these are fairly good overall she has had higher readings recently and has sporadic readings around 140-150 also  She has done only a couple of readings later in the day and these are fairly good, highest reading 174 after supper  She has been told to eat a lot of fruit by her PCP and she thinks this makes her sugar go up but recently has not done postprandial readings  She is only taking 1000 mg of metformin daily and both of these are in the evening  She was told to take evening NPH at bedtime but she is taking this at supper time Also has not had any hypoglycemia  Oral hypoglycemic drugs:  metformin  2 pcs     Side effects from medications: None           Proper timing of medications in relation  to meals: Yes.         Monitors blood glucose: Once a day.    Glucometer:  One Touch           Blood Glucose readings:   Mean values apply above for all meters except median for One Touch  PRE-MEAL Fasting Lunch Dinner Bedtime Overall  Glucose range:  95-208   103     Mean/median:  135     131   POST-MEAL PC Breakfast PC Lunch PC Dinner  Glucose range:   82   142, 174   Mean/median:      Hypoglycemia: None         Meals: 3 meals per day.  breakfast is: Cereal or egg; eating out usually sandwiches       Physical activity: exercise:  is doing water aerobics 3-4/7      Dietician visit: Most recent: 09/7562         Complications: are: minimal      Wt Readings from Last 3 Encounters:  10/30/14 199 lb 3.2 oz (90.357 kg)  10/02/13 198 lb 12.8 oz (90.175 kg)  09/04/13 200 lb 6.4 oz (90.901 kg)    Lab Results  Component Value Date   HGBA1C 8.3* 10/27/2014   HGBA1C 9.4* 09/02/2013   HGBA1C 7.5* 03/06/2013   Lab Results  Component Value Date  MICROALBUR 3.2* 09/02/2013   LDLCALC 85 03/06/2013   CREATININE 0.68 10/27/2014     Appointment on 10/27/2014  Component Date Value Ref Range Status  . Hgb A1c MFr Bld 10/27/2014 8.3* 4.6 - 6.5 % Final   Glycemic Control Guidelines for People with Diabetes:Non Diabetic:  <6%Goal of Therapy: <7%Additional Action Suggested:  >8%   . Sodium 10/27/2014 140  135 - 145 mEq/L Final  . Potassium 10/27/2014 4.3  3.5 - 5.1 mEq/L Final  . Chloride 10/27/2014 106  96 - 112 mEq/L Final  . CO2 10/27/2014 27  19 - 32 mEq/L Final  . Glucose, Bld 10/27/2014 222* 70 - 99 mg/dL Final  . BUN 10/27/2014 13  6 - 23 mg/dL Final  . Creatinine, Ser 10/27/2014 0.68  0.40 - 1.20 mg/dL Final  . Calcium 10/27/2014 9.6  8.4 - 10.5 mg/dL Final  . GFR 10/27/2014 108.25  >60.00 mL/min Final      Medication List       This list is accurate as of: 10/30/14 12:56 PM.  Always use your most recent med list.               ACCU-CHEK AVIVA PLUS W/DEVICE Kit      ACCU-CHEK SOFTCLIX LANCETS lancets     ALPRAZolam 0.5 MG tablet  Commonly known as:  XANAX  Take 0.5 mg by mouth daily as needed for anxiety.     amLODipine 5 MG tablet  Commonly known as:  NORVASC     aspirin EC 81 MG tablet  Take 81 mg by mouth daily.     atorvastatin 10 MG tablet  Commonly known as:  LIPITOR  Take 1 tablet (10 mg total) by mouth daily.     bisacodyl 5 MG EC tablet  Commonly known as:  DULCOLAX  Take 5 mg by mouth daily.     cetirizine 10 MG tablet  Commonly known as:  ZYRTEC  Take 10 mg by mouth daily as needed for allergies.     cyclobenzaprine 5 MG tablet  Commonly known as:  FLEXERIL     glucose blood test strip  Commonly known as:  ONETOUCH VERIO  Use as instructed to check blood sugar 4 times per day dx code 250.00     HYDROcodone-acetaminophen 10-325 MG per tablet  Commonly known as:  NORCO  Take 1 tablet by mouth every 6 (six) hours as needed for pain.     Insulin Glargine 100 UNIT/ML Solostar Pen  Commonly known as:  LANTUS  22 Units. Inject  units every day at bedtime     insulin lispro 100 UNIT/ML KiwkPen  Commonly known as:  HUMALOG KWIKPEN  Inject 8 units at breakfast and lunch and 16 units at dinner     insulin NPH Human 100 UNIT/ML injection  Commonly known as:  NOVOLIN N RELION  INJECT 12 UNITS SUBCUTANEOUSLY IN THE EVENING AND INJECT 8 UNITS IN THE MORNING     losartan-hydrochlorothiazide 100-25 MG per tablet  Commonly known as:  HYZAAR  Take 1 tablet by mouth at bedtime.     metFORMIN 500 MG tablet  Commonly known as:  GLUCOPHAGE     metFORMIN 1000 MG tablet  Commonly known as:  GLUCOPHAGE  Take 0.5 tablets (500 mg total) by mouth daily. Take 1 tablet in am and 3 in pm     niacin 500 MG CR tablet  Commonly known as:  NIASPAN  Take 1,000 mg by mouth at bedtime.  niacin 500 MG tablet  Commonly known as:  SLO-NIACIN  Take 500 mg by mouth at bedtime.     Vitamin D3 5000 UNITS Caps  Take 5,000 Units by mouth.         Allergies: No Known Allergies  Past Medical History  Diagnosis Date  . Diabetes mellitus without complication   . Hypertension   . Stroke   . Chronic kidney disease     kidney stone    Past Surgical History  Procedure Laterality Date  . No past surgeries    . Hand surgery      No family history on file.  Social History:  reports that she has quit smoking. She has never used smokeless tobacco. She reports that she does not drink alcohol or use illicit drugs.  Review of Systems:  HYPERTENSION:  she is on  losartan HCTZ and amlodipine, also followed by PCP. Blood pressure is good today.  She thinks she is taking amlodipine twice a  HYPERLIPIDEMIA: The lipid abnormality consists of elevated LDL Well controlled previously but for some reason she was told to stop simvastatin and is only taking niacin 1 g total  Lab Results  Component Value Date   CHOL 152 03/06/2013   HDL 54.90 03/06/2013   LDLCALC 85 03/06/2013   TRIG 63.0 03/06/2013   CHOLHDL 3 03/06/2013   Last diabetic foot exam in 9/16      Examination:   BP 118/70 mmHg  Pulse 75  Temp(Src) 97.9 F (36.6 C)  Resp 16  Ht 5' 7.75" (1.721 m)  Wt 199 lb 3.2 oz (90.357 kg)  BMI 30.51 kg/m2  SpO2 98%  Body mass index is 30.51 kg/(m^2).   Diabetic foot exam shows normal monofilament sensation in the toes and plantar surfaces, no skin lesions or ulcers on the feet and normal pedal pulses No edema  ASSESSMENT/ PLAN:   Diabetes type 2  The patient's diabetes control appears to be inadequate with A1c over 8% See history of present illness for detailed discussion of his current management, blood sugar patterns and problems identified She is now coming back for follow-up after over a year She is very confused about all her medications, insulin, glucose monitoring at is not following instructions as directed  Reviewed her medications in detail including types of insulin that she is taking in the timing and  duration of action of NPH insulin She does need to check blood sugars after meals more consistently She was told to avoid excessive amounts of fruit Recommend consultation with the dietitian Will adjust her NPH dose as below Increase metformin to 1500 mg a day  Follow-up in 6 weeks May consider restarting Victoza if blood sugars are not consistently controlled; also would be an option to use Invokana  HYPERLIPIDEMIA: She thinks she is not taking simvastatin for unknown reason.  Discussed that she needs to be in a statin drug for her hypercholesterolemia and cardiovascular benefits, she was started on Lipitor 10 mg daily and reduce niacin to 500 mg daily as this has limited data on efficacy   Patient Instructions  Check blood sugars on waking up .Marland Kitchen3-4  .Marland Kitchen times a week Also check blood sugars about 2 hours after a meal and do this after different meals by rotation Recommended blood sugar levels on waking up is 90-130 and about 2 hours after meal is 140-180 Please bring blood sugar monitor to each visit.  Change N insulin to 12 units at bedtime and 8 in am  Metformin 1 in am and 3 at supper  Only small amount of fruit with 1 or 2 meals a day  Start Liptor 10 mg daily  Niacin 1 in at dinner only   Counseling time on subjects discussed above is over 50% of today's 25 minute visit  Alfa Leibensperger 10/30/2014, 12:56 PM   Note: This office note was prepared with Estate agent. Any transcriptional errors that result from this process are unintentional.

## 2014-11-11 DIAGNOSIS — M5020 Other cervical disc displacement, unspecified cervical region: Secondary | ICD-10-CM | POA: Diagnosis not present

## 2014-11-11 DIAGNOSIS — M79602 Pain in left arm: Secondary | ICD-10-CM | POA: Diagnosis not present

## 2014-11-11 DIAGNOSIS — M47812 Spondylosis without myelopathy or radiculopathy, cervical region: Secondary | ICD-10-CM | POA: Diagnosis not present

## 2014-11-16 DIAGNOSIS — H2511 Age-related nuclear cataract, right eye: Secondary | ICD-10-CM | POA: Diagnosis not present

## 2014-11-16 DIAGNOSIS — H25811 Combined forms of age-related cataract, right eye: Secondary | ICD-10-CM | POA: Diagnosis not present

## 2014-11-17 DIAGNOSIS — H25012 Cortical age-related cataract, left eye: Secondary | ICD-10-CM | POA: Diagnosis not present

## 2014-11-17 DIAGNOSIS — H25042 Posterior subcapsular polar age-related cataract, left eye: Secondary | ICD-10-CM | POA: Diagnosis not present

## 2014-11-17 DIAGNOSIS — H2512 Age-related nuclear cataract, left eye: Secondary | ICD-10-CM | POA: Diagnosis not present

## 2014-11-18 DIAGNOSIS — M1000 Idiopathic gout, unspecified site: Secondary | ICD-10-CM | POA: Diagnosis not present

## 2014-11-25 DIAGNOSIS — H25811 Combined forms of age-related cataract, right eye: Secondary | ICD-10-CM | POA: Diagnosis not present

## 2014-11-25 DIAGNOSIS — Z961 Presence of intraocular lens: Secondary | ICD-10-CM | POA: Diagnosis not present

## 2014-11-30 DIAGNOSIS — M5412 Radiculopathy, cervical region: Secondary | ICD-10-CM | POA: Diagnosis not present

## 2014-12-01 ENCOUNTER — Other Ambulatory Visit: Payer: Commercial Managed Care - HMO

## 2014-12-04 ENCOUNTER — Encounter: Payer: Commercial Managed Care - HMO | Admitting: Dietician

## 2014-12-04 ENCOUNTER — Ambulatory Visit: Payer: Commercial Managed Care - HMO | Admitting: Endocrinology

## 2014-12-07 DIAGNOSIS — H25812 Combined forms of age-related cataract, left eye: Secondary | ICD-10-CM | POA: Diagnosis not present

## 2014-12-07 DIAGNOSIS — Z961 Presence of intraocular lens: Secondary | ICD-10-CM | POA: Diagnosis not present

## 2014-12-07 DIAGNOSIS — H2512 Age-related nuclear cataract, left eye: Secondary | ICD-10-CM | POA: Diagnosis not present

## 2014-12-11 ENCOUNTER — Ambulatory Visit: Payer: Commercial Managed Care - HMO | Admitting: Endocrinology

## 2014-12-14 DIAGNOSIS — Z961 Presence of intraocular lens: Secondary | ICD-10-CM | POA: Diagnosis not present

## 2014-12-14 DIAGNOSIS — H25812 Combined forms of age-related cataract, left eye: Secondary | ICD-10-CM | POA: Diagnosis not present

## 2014-12-29 DIAGNOSIS — M4722 Other spondylosis with radiculopathy, cervical region: Secondary | ICD-10-CM | POA: Diagnosis not present

## 2015-01-04 ENCOUNTER — Other Ambulatory Visit (INDEPENDENT_AMBULATORY_CARE_PROVIDER_SITE_OTHER): Payer: Commercial Managed Care - HMO

## 2015-01-04 DIAGNOSIS — E1165 Type 2 diabetes mellitus with hyperglycemia: Secondary | ICD-10-CM

## 2015-01-04 DIAGNOSIS — IMO0002 Reserved for concepts with insufficient information to code with codable children: Secondary | ICD-10-CM

## 2015-01-04 LAB — COMPREHENSIVE METABOLIC PANEL
ALBUMIN: 3.6 g/dL (ref 3.5–5.2)
ALT: 13 U/L (ref 0–35)
AST: 14 U/L (ref 0–37)
Alkaline Phosphatase: 49 U/L (ref 39–117)
BILIRUBIN TOTAL: 0.6 mg/dL (ref 0.2–1.2)
BUN: 10 mg/dL (ref 6–23)
CALCIUM: 9.1 mg/dL (ref 8.4–10.5)
CO2: 28 mEq/L (ref 19–32)
CREATININE: 0.65 mg/dL (ref 0.40–1.20)
Chloride: 108 mEq/L (ref 96–112)
GFR: 113.98 mL/min (ref >60.00)
Glucose, Bld: 120 mg/dL — ABNORMAL HIGH (ref 70–99)
Potassium: 3.9 mEq/L (ref 3.5–5.1)
SODIUM: 144 meq/L (ref 135–145)
TOTAL PROTEIN: 6 g/dL (ref 6.0–8.3)

## 2015-01-04 LAB — LIPID PANEL
CHOL/HDL RATIO: 3
Cholesterol: 158 mg/dL (ref 0–200)
HDL: 51.3 mg/dL (ref >39.00)
LDL Cholesterol: 94 mg/dL (ref 0–99)
NONHDL: 106.22
Triglycerides: 61 mg/dL (ref 0.0–149.0)
VLDL: 12.2 mg/dL (ref 0.0–40.0)

## 2015-01-05 LAB — FRUCTOSAMINE: Fructosamine: 270 umol/L (ref 0–285)

## 2015-01-07 ENCOUNTER — Encounter: Payer: Self-pay | Admitting: Endocrinology

## 2015-01-07 ENCOUNTER — Ambulatory Visit (INDEPENDENT_AMBULATORY_CARE_PROVIDER_SITE_OTHER): Payer: Commercial Managed Care - HMO | Admitting: Endocrinology

## 2015-01-07 ENCOUNTER — Encounter: Payer: Self-pay | Admitting: Dietician

## 2015-01-07 ENCOUNTER — Encounter: Payer: Commercial Managed Care - HMO | Attending: Family Medicine | Admitting: Dietician

## 2015-01-07 VITALS — BP 128/62 | HR 77 | Temp 98.7°F | Resp 14 | Ht 67.5 in | Wt 199.6 lb

## 2015-01-07 VITALS — Ht 67.5 in | Wt 199.0 lb

## 2015-01-07 DIAGNOSIS — E785 Hyperlipidemia, unspecified: Secondary | ICD-10-CM | POA: Diagnosis not present

## 2015-01-07 DIAGNOSIS — E109 Type 1 diabetes mellitus without complications: Secondary | ICD-10-CM

## 2015-01-07 DIAGNOSIS — Z794 Long term (current) use of insulin: Secondary | ICD-10-CM

## 2015-01-07 DIAGNOSIS — Z23 Encounter for immunization: Secondary | ICD-10-CM | POA: Diagnosis not present

## 2015-01-07 DIAGNOSIS — E119 Type 2 diabetes mellitus without complications: Secondary | ICD-10-CM | POA: Insufficient documentation

## 2015-01-07 DIAGNOSIS — Z713 Dietary counseling and surveillance: Secondary | ICD-10-CM | POA: Diagnosis not present

## 2015-01-07 DIAGNOSIS — E1165 Type 2 diabetes mellitus with hyperglycemia: Secondary | ICD-10-CM

## 2015-01-07 NOTE — Patient Instructions (Addendum)
Metformin 1000mg , 1 in am and at dinner  Check blood sugars on waking up 3  times a week Also check blood sugars about 2 hours after a meal and do this after different meals by rotation  Recommended blood sugar levels on waking up is 90-130 and about 2 hours after meal is 130-160  Please bring your blood sugar monitor to each visit, thank you  With dexamethasone take 2 more U of Humalog on those Those days

## 2015-01-07 NOTE — Patient Instructions (Signed)
Aim for 3 Carb Choices per meal (45 grams) +/- 1 either way  Aim for 0-1 Carbs per snack if hungry  Include protein in moderation with your meals and snacks Consider reading food labels for Total Carbohydrate and Fat Grams of foods Consider  increasing your activity level by water aerobics for 45-60 minutes daily as tolerated Consider checking BG at alternate times per day as directed by MD  Consider taking medication as directed by MD

## 2015-01-07 NOTE — Progress Notes (Signed)
Patient ID: Sandra Washington, female   DOB: 08-May-1940, 73 y.o.   MRN: [id number]   Reason for Appointment: Diabetes follow-up   History of Present Illness   Diagnosis: Type 2 DIABETES MELITUS, date of diagnosis:   1995       She has been on insulin almost since her diagnosis and has had various regimens over the years More recently has been on basal bolus insulin with Lantus and Humalog with variable control, depending on her compliance with diet and exercise She has generally been having difficulty with consistent compliance in the last few months because of taking care of her husband  Recent history:  Insulin regimen:  Novolin N 12 units a.m.--8 units at bedtime; Humalog 10-05-14 ac tid   She had previously been tried on Victoza because of poor control but she could not continue this because of cost Also she had called to change her Lantus to NPH because of high out-of-pocket expense Her last A1c was still high at 8.3  Since her last visit she has been doing fairly well overall but now she said that she is periodically getting dexamethasone tablets from her orthopedic surgeon for neck pain and maybe get a high sugars from this  Current blood sugar patterns, management and problems:  She is testing her blood sugar at different times as directed although somewhat erratically  Her fasting blood sugars are generally fairly good but had a couple of times from late night snack.  She did have a few high readings last week in the afternoon, probably when she was taking dexamethasone but did not adjust her insulin based on this  She is very compliant with her insulin doses and currently wants to continue multiple injection regimen  Usually her blood sugars are not high after meals in the evening; did have a low sugar 1 evening when she had smaller meal.  She is not adjusting her Humalog based on how much she is eating  Recent average blood sugar is only 129; however most  of the readings are in the mornings  On her last visit she did start taking her NPH at bedtime  Oral hypoglycemic drugs:  metformin  2 pcs     Side effects from medications: None           Proper timing of medications in relation to meals: Yes.         Monitors blood glucose: Once a day.    Glucometer:  One Touch           Blood Glucose readings:   Mean values apply above for all meters except median for One Touch  PRE-MEAL Fasting  afternoon  Dinner Bedtime Overall  Glucose range:  73-198   74-238   134-193   60-229    Mean/median:  111     122   129+/-51    Hypoglycemia: As above         Meals: 3 meals per day.  breakfast is: Cereal or egg; eating out usually sandwiches. Lunch  12:30       Physical activity: exercise:  is doing water aerobics 2-3/7      Dietician visit: Most recent: 06/4490         Complications: are: minimal      Wt Readings from Last 3 Encounters:  01/07/15 199 lb (90.266 kg)  01/07/15 199 lb 9.6 oz (90.538 kg)  10/30/14 199 lb 3.2 oz (90.357 kg)    Lab Results  Component  Value Date   HGBA1C 8.3* 10/27/2014   HGBA1C 9.4* 09/02/2013   HGBA1C 7.5* 03/06/2013   Lab Results  Component Value Date   MICROALBUR 3.2* 09/02/2013   LDLCALC 94 01/04/2015   CREATININE 0.65 01/04/2015     Appointment on 01/04/2015  Component Date Value Ref Range Status  . Sodium 01/04/2015 144  135 - 145 mEq/L Final  . Potassium 01/04/2015 3.9  3.5 - 5.1 mEq/L Final  . Chloride 01/04/2015 108  96 - 112 mEq/L Final  . CO2 01/04/2015 28  19 - 32 mEq/L Final  . Glucose, Bld 01/04/2015 120* 70 - 99 mg/dL Final  . BUN 01/04/2015 10  6 - 23 mg/dL Final  . Creatinine, Ser 01/04/2015 0.65  0.40 - 1.20 mg/dL Final  . Total Bilirubin 01/04/2015 0.6  0.2 - 1.2 mg/dL Final  . Alkaline Phosphatase 01/04/2015 49  39 - 117 U/L Final  . AST 01/04/2015 14  0 - 37 U/L Final  . ALT 01/04/2015 13  0 - 35 U/L Final  . Total Protein 01/04/2015 6.0  6.0 - 8.3 g/dL Final  . Albumin 01/04/2015  3.6  3.5 - 5.2 g/dL Final  . Calcium 01/04/2015 9.1  8.4 - 10.5 mg/dL Final  . GFR 01/04/2015 113.98  >60.00 mL/min Final  . Fructosamine 01/04/2015 270  0 - 285 umol/L Final   Comment: Published reference interval for apparently healthy subjects between age 69 and 29 is 62 - 285 umol/L and in a poorly controlled diabetic population is 228 - 563 umol/L with a mean of 396 umol/L.   Marland Kitchen Cholesterol 01/04/2015 158  0 - 200 mg/dL Final   ATP III Classification       Desirable:  < 200 mg/dL               Borderline High:  200 - 239 mg/dL          High:  > = 240 mg/dL  . Triglycerides 01/04/2015 61.0  0.0 - 149.0 mg/dL Final   Normal:  <150 mg/dLBorderline High:  150 - 199 mg/dL  . HDL 01/04/2015 51.30  >39.00 mg/dL Final  . VLDL 01/04/2015 12.2  0.0 - 40.0 mg/dL Final  . LDL Cholesterol 01/04/2015 94  0 - 99 mg/dL Final  . Total CHOL/HDL Ratio 01/04/2015 3   Final                  Men          Women1/2 Average Risk     3.4          3.3Average Risk          5.0          4.42X Average Risk          9.6          7.13X Average Risk          15.0          11.0                      . NonHDL 01/04/2015 106.22   Final   NOTE:  Non-HDL goal should be 30 mg/dL higher than patient's LDL goal (i.e. LDL goal of < 70 mg/dL, would have non-HDL goal of < 100 mg/dL)      Medication List       This list is accurate as of: 01/07/15  2:39 PM.  Always use your most recent med list.  ACCU-CHEK AVIVA PLUS W/DEVICE Kit     ACCU-CHEK SOFTCLIX LANCETS lancets     ALPRAZolam 0.5 MG tablet  Commonly known as:  XANAX  Take 0.5 mg by mouth daily as needed for anxiety.     amLODipine 5 MG tablet  Commonly known as:  NORVASC     aspirin EC 81 MG tablet  Take 81 mg by mouth daily.     atorvastatin 10 MG tablet  Commonly known as:  LIPITOR  Take 1 tablet (10 mg total) by mouth daily.     bisacodyl 5 MG EC tablet  Commonly known as:  DULCOLAX  Take 5 mg by mouth daily.     cetirizine 10 MG  tablet  Commonly known as:  ZYRTEC  Take 10 mg by mouth daily as needed for allergies.     cyclobenzaprine 5 MG tablet  Commonly known as:  FLEXERIL     dexamethasone 4 MG tablet  Commonly known as:  DECADRON  Take 4 mg by mouth 2 (two) times daily with a meal.     glucose blood test strip  Commonly known as:  ONETOUCH VERIO  Use as instructed to check blood sugar 4 times per day dx code 250.00     HYDROcodone-acetaminophen 10-325 MG tablet  Commonly known as:  NORCO  Take 1 tablet by mouth every 6 (six) hours as needed for pain.     insulin lispro 100 UNIT/ML KiwkPen  Commonly known as:  HUMALOG KWIKPEN  Inject 8 units at breakfast and lunch and 16 units at dinner     insulin NPH Human 100 UNIT/ML injection  Commonly known as:  NOVOLIN N RELION  INJECT 12 UNITS SUBCUTANEOUSLY IN THE EVENING AND INJECT 8 UNITS IN THE MORNING     losartan-hydrochlorothiazide 100-25 MG tablet  Commonly known as:  HYZAAR  Take 1 tablet by mouth at bedtime.     metFORMIN 1000 MG tablet  Commonly known as:  GLUCOPHAGE  Take 0.5 tablets (500 mg total) by mouth daily. Take 1 tablet in am and 3 in pm     niacin 500 MG tablet  Commonly known as:  SLO-NIACIN  Take 500 mg by mouth at bedtime.     Vitamin D3 5000 UNITS Caps  Take 5,000 Units by mouth.        Allergies: No Known Allergies  Past Medical History  Diagnosis Date  . Diabetes mellitus without complication ([place])   . Hypertension   . Stroke ([place])   . Chronic kidney disease     kidney stone    Past Surgical History  Procedure Laterality Date  . No past surgeries    . Hand surgery      No family history on file.  Social History:  reports that she has quit smoking. She has never used smokeless tobacco. She reports that she does not drink alcohol or use illicit drugs.  Review of Systems:  HYPERTENSION:  she is on  losartan HCTZ and amlodipine, also followed by PCP.  Blood pressure is normal today.   HYPERLIPIDEMIA: The  lipid abnormality consists of elevated LDL  Well controlled now with the resuming her statin drug, taking Lipitor because of drug interaction between  Simvastatin with her amlodipine She was told to reduce her niacin to 500 mg to avoid any effect on insulin resistance and her HDL is still fairly good   Lab Results  Component Value Date   CHOL 158 01/04/2015   HDL 51.30 01/04/2015  LDLCALC 94 01/04/2015   TRIG 61.0 01/04/2015   CHOLHDL 3 01/04/2015   Last diabetic foot exam in 9/16      Examination:   BP 128/62 mmHg  Pulse 77  Temp(Src) 98.7 F (37.1 C) (Oral)  Resp 14  Ht 5' 7.5" (1.715 m)  Wt 199 lb 9.6 oz (90.538 kg)  BMI 30.78 kg/m2  SpO2 97%  Body mass index is 30.78 kg/(m^2).   No ankle edema present  ASSESSMENT/ PLAN:   Diabetes type 2  The patient's diabetes control appears to be improving as is by her fructosamine and her blood sugars at home being fairly good See history of present illness for detailed discussion of his current management, blood sugar patterns and problems identified She has started checking blood sugars more often after lunch and around suppertime although not enough after evening meal  She is now understanding better the timing of her insulin doses and glucose monitoring However she does not adjust mealtime doses based on how much she is eating and has had a low blood sugar with not reducing her suppertime dose Also she does not adjust her Novolog with her blood sugar goes off for various reasons including taking dexamethasone from the orthopedic surgeon  Fasting readings are generally in good control She has been exercising intermittently and discussed need to continue this regularly Also again discussed balanced meals and avoiding excessive nighttime snacks METFORMIN: Not clear if she is taking now 500 or 1000 mg tablet and she is taking 4 tablets a day  For now will continue her insulin regimen unchanged but discussed adjusting Novolog  based on various factors as above  Hypertension: Well controlled  HYPERLIPIDEMIA: She has better control of lipids with Lipitor and HDL is still fairly good with only 500 mg niacin which she can continue as prescribed by PCP  Patient Instructions  Metformin 104m, 1 in am and at dinner  Check blood sugars on waking up 3  times a week Also check blood sugars about 2 hours after a meal and do this after different meals by rotation  Recommended blood sugar levels on waking up is 90-130 and about 2 hours after meal is 130-160  Please bring your blood sugar monitor to each visit, thank you  With dexamethasone take 2 more U of Humalog on those Those days          Counseling time on subjects discussed above is over 50% of today's 25 minute visit  Delesia Martinek 01/07/2015, 2:39 PM   Note: This office note was prepared with Dragon voice recognition system technology. Any transcriptional errors that result from this process are unintentional.

## 2015-01-07 NOTE — Progress Notes (Signed)
Diabetes Self-Management Education  Visit Type: First/Initial  Appt. Start Time: 0930 Appt. End Time: 1030  01/07/2015  Sandra Washington, identified by name and date of birth, is a 74 y.o. female with a diagnosis of Diabetes: Type 2 for the past 35 years and has been on insulin for almost as long.  She is here alone.  Patient lives alone.  Her husband died last year.  She does her own shopping and cooking.  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  Her last HgbA1C was 8.3% (10/27/14).  Fructosamine 270 01/04/15.  Lipids are wnl and GFR 114.  She is confused about recommendations stating that internist recommended 5 servings of fruit per day.  She states that she would like to drink a lot of juice and eat more fruit but it makes her blood sugar go up.  Discussed appropriate amounts of fruit.  She currently takes  Metformin, Novolin 10 units in the am which will change to 8 units, 12 units at night which will change to 10 units after visit with MD today and Humalog 8 units with breakfast, 8 units with lunch, and 16 units with dinner.  She eats a bed time snack so her blood sugar does not get too low overnight.  Her dinner is usually much lighter and fewer carbohydrates than dinner.  ASSESSMENT  Height 5' 7.5" (1.715 m), weight 199 lb (90.266 kg). Body mass index is 30.69 kg/(m^2).      Diabetes Self-Management Education - 01/07/15 [id number]    Visit Information   Visit Type First/Initial   Initial Visit   Diabetes Type Type 2   Are you currently following a meal plan? Yes   What type of meal plan do you follow? balanced meals   Are you taking your medications as prescribed? Yes   Date Diagnosed about 35 years ago   Health Coping   How would you rate your overall health? Good   Psychosocial Assessment   Patient Belief/Attitude about Diabetes Motivated to manage diabetes   Self-care barriers None   Self-management support  Doctor's office;Friends;Family   Other persons present Patient   Patient Concerns Nutrition/Meal planning   Special Needs None   Preferred Learning Style No preference indicated   Learning Readiness Change in progress   How often do you need to have someone help you when you read instructions, pamphlets, or other written materials from your doctor or pharmacy? 1 - Never   What is the last grade level you completed in school? 1 year college   Complications   Last HgB A1C per patient/outside source (p) 8.3 %  10/27/14   How often do you check your blood sugar? (p) 3-4 times/day   Fasting Blood glucose range (mg/dL) (p) 70-129   Postprandial Blood glucose range (mg/dL) (p) 130-179   Number of hypoglycemic episodes per month (p) 0   Number of hyperglycemic episodes per week (p) 0   Have you had a dilated eye exam in the past 12 months? (p) Yes   Have you had a dental exam in the past 12 months? (p) No  dentures   Are you checking your feet? (p) Yes   How many days per week are you checking your feet? (p) 7   Dietary Intake   Breakfast regular oatmeal with splenda,  2-3 slices, bacon, 1-2 slices toast with margarine, 2% milk, coffee with creamer and splenda or equal   Snack (morning) apple  or other fruit   Lunch egg roll  with shrimp or grilled cheese sandwhich and soup or other sandwhich OR baked potato and salad with chicken or loaded baked potato   Snack (afternoon) orange or grapefruit or grapes   Dinner chicken or fish (fried or baked), green beans or peas, baked potato or rice or pasta    Snack (evening) popcorn or Glucerna or peanut butter and crackers   Beverage(s) 2% milk, water, coffee with cream and artificial sweetener, unsweeted tea with splenda   Exercise   Exercise Type (p) Moderate (swimming / aerobic walking)  water areobics   How many days per week to you exercise? (p) 3   How many minutes per day do you exercise? (p) 60   Total minutes per week of exercise (p) 180    Patient Education   Previous Diabetes Education (p) Yes (please comment)  many times      Individualized Plan for Diabetes Self-Management Training:   Learning Objective:  Patient will have a greater understanding of diabetes self-management. Patient education plan is to attend individual and/or group sessions per assessed needs and concerns.   Plan:   Patient Instructions  Aim for 3 Carb Choices per meal (45 grams) +/- 1 either way  Aim for 0-1 Carbs per snack if hungry  Include protein in moderation with your meals and snacks Consider reading food labels for Total Carbohydrate and Fat Grams of foods Consider  increasing your activity level by water aerobics for 45-60 minutes daily as tolerated Consider checking BG at alternate times per day as directed by MD  Consider taking medication as directed by MD     Expected Outcomes:   Patient demonstrated interest in learning.  Positive outcome expected.  Education material provided: A1C conversion sheet, Meal plan card and Snack sheet  If problems or questions, patient to contact team via:  Phone and Email  Future DSME appointment:  prn

## 2015-01-12 DIAGNOSIS — Z0100 Encounter for examination of eyes and vision without abnormal findings: Secondary | ICD-10-CM | POA: Diagnosis not present

## 2015-02-05 ENCOUNTER — Telehealth: Payer: Self-pay | Admitting: Endocrinology

## 2015-02-05 NOTE — Telephone Encounter (Signed)
Patient need a verbal order for patient Glucose Testing Strips send to [facility] ( I didn't see pharmacy in patient chart)

## 2015-02-12 NOTE — Telephone Encounter (Signed)
I spoke with the pharmacy rep and let them know it was NOT okay to switch meters, they understood, then I called and spoke with the patient to let her know what they wanted to do, and she said she didn't really want them in the first place and to just cancel it.   I gave her the pharmacy phone number and advised her to call as well.

## 2015-02-12 NOTE — Telephone Encounter (Signed)
Sandra Washington from [facility] stated that they do not carry the one touch verio meter, they only have the True meter, is it ok to change meter to the true meter, please advise Phone # [phone]

## 2015-03-18 ENCOUNTER — Other Ambulatory Visit: Payer: Self-pay | Admitting: *Deleted

## 2015-03-18 MED ORDER — INSULIN ASPART 100 UNIT/ML FLEXPEN: PEN_INJECTOR | SUBCUTANEOUS | Status: DC

## 2015-03-19 ENCOUNTER — Telehealth: Payer: Self-pay | Admitting: Endocrinology

## 2015-03-19 NOTE — Telephone Encounter (Signed)
Patient Name: Sandra Washington Gender: Female DOB: 1940/11/23 Age: 75 Y 2 M 4 D Return Phone Number: [phone] (Primary) Address: City/State/Zip: [place] Client [facility] Night - Client Client Site [facility] Physician Elayne Snare Contact Type Call Call Type Triage / Clinical Relationship To Patient Self Return Phone Number [phone] (Primary) Chief Complaint Medication Question (non symptomatic) Initial Comment Caller states her insulin has been changed and she's not sure what she needs to do. Nurse Assessment Nurse: Donovan Kail, RN, Barnetta Chapel Date/Time (Eastern Time): 03/18/2015 5:13:35 PM Please select the assessment type ---RX called in but not at pharm Additional Documentation ---Caller states her insulin has been changed and she's not sure what she needs to do. Dr switched the Novolog to humalog quick pen. Insurance wouldn't pay so pharmacy has switched it to novolog. She was upset and didn't pick up the script. She runs out of the short acting insulin in a couple of days. Document the name of the medication. ---[place] Has the office closed within the last 30 minutes? ---No Does the client directives allow for assistance with medications after hours? ---Yes Is there an on-call physician for the client? ---Yes Additional Documentation ---This can wait until the morning. Caller advised to call office in morning to straighten out the insurance issues with the pharmacy. Guidelines Guideline Title Affirmed Question Affirmed Notes Nurse Date/Time (Eastern Time) Disp. Time Eilene Ghazi Time) Disposition Final User 03/18/2015 5:18:11 PM

## 2015-03-19 NOTE — Telephone Encounter (Signed)
Pt calling again about the insulin directions please

## 2015-03-19 NOTE — Telephone Encounter (Signed)
I explained to Sandra Washington that her insurance prefers Novolog instead of Humalog and that they are the exact same medication but made by different manufacturers. I instructed her to take the Novolog exactly the same as she took the Humalog, patient voiced understanding.

## 2015-03-22 DIAGNOSIS — Z6832 Body mass index (BMI) 32.0-32.9, adult: Secondary | ICD-10-CM | POA: Diagnosis not present

## 2015-03-22 DIAGNOSIS — I10 Essential (primary) hypertension: Secondary | ICD-10-CM | POA: Diagnosis not present

## 2015-03-22 DIAGNOSIS — M4722 Other spondylosis with radiculopathy, cervical region: Secondary | ICD-10-CM | POA: Diagnosis not present

## 2015-04-05 ENCOUNTER — Other Ambulatory Visit: Payer: Commercial Managed Care - HMO

## 2015-04-08 ENCOUNTER — Telehealth: Payer: Self-pay | Admitting: Endocrinology

## 2015-04-08 ENCOUNTER — Ambulatory Visit: Payer: Commercial Managed Care - HMO | Admitting: Endocrinology

## 2015-04-08 NOTE — Telephone Encounter (Signed)
Patient no showed today's appt. Please advise on how to follow up. °A. No follow up necessary. °B. Follow up urgent. Contact patient immediately. °C. Follow up necessary. Contact patient and schedule visit in ___ days. °D. Follow up advised. Contact patient and schedule visit in ____weeks. ° °

## 2015-04-08 NOTE — Telephone Encounter (Signed)
Reschedule asap

## 2015-04-09 NOTE — Telephone Encounter (Signed)
Please see message from [place].

## 2015-04-12 NOTE — Telephone Encounter (Signed)
Please reschedule missed appointment, thanks

## 2015-06-16 DIAGNOSIS — E782 Mixed hyperlipidemia: Secondary | ICD-10-CM | POA: Diagnosis not present

## 2015-06-16 DIAGNOSIS — I10 Essential (primary) hypertension: Secondary | ICD-10-CM | POA: Diagnosis not present

## 2015-06-16 DIAGNOSIS — E089 Diabetes mellitus due to underlying condition without complications: Secondary | ICD-10-CM | POA: Diagnosis not present

## 2015-06-24 DIAGNOSIS — K5900 Constipation, unspecified: Secondary | ICD-10-CM | POA: Diagnosis not present

## 2015-06-24 DIAGNOSIS — Z86010 Personal history of colonic polyps: Secondary | ICD-10-CM | POA: Diagnosis not present

## 2015-06-24 DIAGNOSIS — Z1211 Encounter for screening for malignant neoplasm of colon: Secondary | ICD-10-CM | POA: Diagnosis not present

## 2015-07-01 DIAGNOSIS — Z1212 Encounter for screening for malignant neoplasm of rectum: Secondary | ICD-10-CM | POA: Diagnosis not present

## 2015-07-01 DIAGNOSIS — Z1211 Encounter for screening for malignant neoplasm of colon: Secondary | ICD-10-CM | POA: Diagnosis not present

## 2015-08-26 DIAGNOSIS — M4722 Other spondylosis with radiculopathy, cervical region: Secondary | ICD-10-CM | POA: Diagnosis not present

## 2015-08-26 DIAGNOSIS — Z6832 Body mass index (BMI) 32.0-32.9, adult: Secondary | ICD-10-CM | POA: Diagnosis not present

## 2015-09-15 DIAGNOSIS — I10 Essential (primary) hypertension: Secondary | ICD-10-CM | POA: Diagnosis not present

## 2015-09-15 DIAGNOSIS — E782 Mixed hyperlipidemia: Secondary | ICD-10-CM | POA: Diagnosis not present

## 2015-09-15 DIAGNOSIS — E089 Diabetes mellitus due to underlying condition without complications: Secondary | ICD-10-CM | POA: Diagnosis not present

## 2015-10-11 ENCOUNTER — Telehealth: Payer: Self-pay | Admitting: Endocrinology

## 2015-10-11 MED ORDER — INSULIN ASPART 100 UNIT/ML ~~LOC~~ SOLN: SUBCUTANEOUS | 1 refills | Status: DC

## 2015-10-11 MED ORDER — ACCU-CHEK AVIVA PLUS W/DEVICE KIT
PACK | 1 refills | Status: DC
Start: 2015-10-11 — End: 2018-12-23

## 2015-10-11 NOTE — Telephone Encounter (Signed)
PT requests call back regarding one of her prescriptions for her insulin.

## 2015-10-11 NOTE — Telephone Encounter (Signed)
I contacted the pt. She advised me she needed a rx for the novolog, but wanted it to be changed from the pens to the vials. Rx changed per pt's request and she also requested a new rx for her meter to be sent. Both rx's submitted per pt's request. She had not further questions at this time.

## 2015-10-19 ENCOUNTER — Other Ambulatory Visit (INDEPENDENT_AMBULATORY_CARE_PROVIDER_SITE_OTHER): Payer: Commercial Managed Care - HMO

## 2015-10-19 DIAGNOSIS — E785 Hyperlipidemia, unspecified: Secondary | ICD-10-CM

## 2015-10-19 DIAGNOSIS — E1165 Type 2 diabetes mellitus with hyperglycemia: Secondary | ICD-10-CM

## 2015-10-19 DIAGNOSIS — Z794 Long term (current) use of insulin: Secondary | ICD-10-CM

## 2015-10-19 LAB — URINALYSIS, ROUTINE W REFLEX MICROSCOPIC
Bilirubin Urine: NEGATIVE
HGB URINE DIPSTICK: NEGATIVE
Ketones, ur: NEGATIVE
NITRITE: NEGATIVE
Specific Gravity, Urine: 1.025 (ref 1.000–1.030)
Total Protein, Urine: NEGATIVE
Urine Glucose: NEGATIVE
Urobilinogen, UA: 0.2 (ref 0.0–1.0)
pH: 6 (ref 5.0–8.0)

## 2015-10-19 LAB — COMPREHENSIVE METABOLIC PANEL
ALBUMIN: 3.9 g/dL (ref 3.5–5.2)
ALK PHOS: 50 U/L (ref 39–117)
ALT: 13 U/L (ref 0–35)
AST: 14 U/L (ref 0–37)
BILIRUBIN TOTAL: 0.5 mg/dL (ref 0.2–1.2)
BUN: 14 mg/dL (ref 6–23)
CO2: 28 mEq/L (ref 19–32)
Calcium: 9 mg/dL (ref 8.4–10.5)
Chloride: 109 mEq/L (ref 96–112)
Creatinine, Ser: 0.74 mg/dL (ref 0.40–1.20)
GFR: 97.93 mL/min (ref >60.00)
GLUCOSE: 77 mg/dL (ref 70–99)
Potassium: 4.2 mEq/L (ref 3.5–5.1)
Sodium: 143 mEq/L (ref 135–145)
TOTAL PROTEIN: 6.3 g/dL (ref 6.0–8.3)

## 2015-10-19 LAB — HEMOGLOBIN A1C: HEMOGLOBIN A1C: 9.9 % — AB (ref 4.6–6.5)

## 2015-10-19 LAB — MICROALBUMIN / CREATININE URINE RATIO
Creatinine,U: 222.4 mg/dL
MICROALB/CREAT RATIO: 0.5 mg/g (ref 0.0–30.0)
Microalb, Ur: 1.1 mg/dL (ref 0.0–1.9)

## 2015-10-19 LAB — TSH: TSH: 1.52 u[IU]/mL (ref 0.35–4.50)

## 2015-10-22 ENCOUNTER — Ambulatory Visit (INDEPENDENT_AMBULATORY_CARE_PROVIDER_SITE_OTHER): Payer: Commercial Managed Care - HMO | Admitting: Endocrinology

## 2015-10-22 ENCOUNTER — Encounter: Payer: Self-pay | Admitting: Endocrinology

## 2015-10-22 VITALS — BP 128/68 | HR 62 | Ht 72.0 in | Wt 202.0 lb

## 2015-10-22 DIAGNOSIS — Z794 Long term (current) use of insulin: Secondary | ICD-10-CM

## 2015-10-22 DIAGNOSIS — E1165 Type 2 diabetes mellitus with hyperglycemia: Secondary | ICD-10-CM

## 2015-10-22 DIAGNOSIS — I10 Essential (primary) hypertension: Secondary | ICD-10-CM

## 2015-10-22 MED ORDER — INSULIN GLARGINE 300 UNIT/ML ~~LOC~~ SOPN: 22.0000 [IU] | PEN_INJECTOR | Freq: Every day | SUBCUTANEOUS | 1 refills | Status: DC

## 2015-10-22 NOTE — Progress Notes (Signed)
Patient ID: Sandra Washington, female   DOB: 03-Oct-1940, 75 y.o.   MRN: [id number]   Reason for Appointment: Diabetes follow-up   History of Present Illness   Diagnosis: Type 2 DIABETES MELITUS, date of diagnosis:   1995       She has been on insulin almost since her diagnosis and has had various regimens over the years More recently has been on basal bolus insulin with Lantus and Humalog with variable control, depending on her compliance with diet and exercise She has generally been having difficulty with consistent compliance in the last few months because of taking care of her husband  Recent history:  Insulin regimen:  Novolin N 10 units a.m.--12 units at bedtime; Humalog 10-05-14 ac tid   She had previously been tried on Victoza because of poor control but she could not continue this because of cost Also we had to change Lantus to NPH because of high out-of-pocket expense Her last A1c was still high at 8.3 and now it is up to 9.9   Current blood sugar patterns, management and problems:  She is testing her blood sugar mostly in the morning recently and did not bring her previous meter which had readings before 8/16  Her fasting blood sugars are sometimes significantly high and not clear why  Previously would be having extra snack later night causing high readings  She does not think she forgets her insulin at bedtime but while traveling she may sometimes forget her mealtime insulin  Not clear why her A1c is so high as blood sugars did not appear to be high at home or in the lab  She is a little confused about which insulin she is taking at what time and the names of the insulins  Recently changed her Humalog pen to the Novolog bottle  Oral hypoglycemic drugs:  metformin  2 pcs     Side effects from medications: None           Proper timing of medications in relation to meals: Yes.         Monitors blood glucose: Once a day.    Glucometer:  One Touch            Blood Glucose readings:   Mean values apply above for all meters except median for One Touch  PRE-MEAL Fasting Lunch Dinner Bedtime Overall  Glucose range: 96-214  187  100-130  92    Mean/median: 137     134    Hypoglycemia: None        Meals: 3 meals per day.  breakfast is: Cereal or egg; eating out usually having sandwiches. Lunch  12:30pm       Physical activity: exercise:  is not doing water aerobics previously going 2-3/7      Dietician visit: Most recent: 07/8030         Complications: are: minimal      Wt Readings from Last 3 Encounters:  10/22/15 202 lb (91.6 kg)  01/07/15 199 lb (90.3 kg)  01/07/15 199 lb 9.6 oz (90.5 kg)    Lab Results  Component Value Date   HGBA1C 9.9 (H) 10/19/2015   HGBA1C 8.3 (H) 10/27/2014   HGBA1C 9.4 (H) 09/02/2013   Lab Results  Component Value Date   MICROALBUR 1.1 10/19/2015   LDLCALC 94 01/04/2015   CREATININE 0.74 10/19/2015     Lab on 10/19/2015  Component Date Value Ref Range Status  . Hgb A1c MFr Bld 10/19/2015 9.9*  4.6 - 6.5 % Final  . Sodium 10/19/2015 143  135 - 145 mEq/L Final  . Potassium 10/19/2015 4.2  3.5 - 5.1 mEq/L Final  . Chloride 10/19/2015 109  96 - 112 mEq/L Final  . CO2 10/19/2015 28  19 - 32 mEq/L Final  . Glucose, Bld 10/19/2015 77  70 - 99 mg/dL Final  . BUN 10/19/2015 14  6 - 23 mg/dL Final  . Creatinine, Ser 10/19/2015 0.74  0.40 - 1.20 mg/dL Final  . Total Bilirubin 10/19/2015 0.5  0.2 - 1.2 mg/dL Final  . Alkaline Phosphatase 10/19/2015 50  39 - 117 U/L Final  . AST 10/19/2015 14  0 - 37 U/L Final  . ALT 10/19/2015 13  0 - 35 U/L Final  . Total Protein 10/19/2015 6.3  6.0 - 8.3 g/dL Final  . Albumin 10/19/2015 3.9  3.5 - 5.2 g/dL Final  . Calcium 10/19/2015 9.0  8.4 - 10.5 mg/dL Final  . GFR 10/19/2015 97.93  >60.00 mL/min Final  . Microalb, Ur 10/19/2015 1.1  0.0 - 1.9 mg/dL Final  . Creatinine,U 10/19/2015 222.4  mg/dL Final  . Microalb Creat Ratio 10/19/2015 0.5  0.0 - 30.0 mg/g Final  .  Color, Urine 10/19/2015 YELLOW  Yellow;Lt. Yellow Final  . APPearance 10/19/2015 CLEAR  Clear Final  . Specific Gravity, Urine 10/19/2015 1.025  1.000 - 1.030 Final  . pH 10/19/2015 6.0  5.0 - 8.0 Final  . Total Protein, Urine 10/19/2015 NEGATIVE  Negative Final  . Urine Glucose 10/19/2015 NEGATIVE  Negative Final  . Ketones, ur 10/19/2015 NEGATIVE  Negative Final  . Bilirubin Urine 10/19/2015 NEGATIVE  Negative Final  . Hgb urine dipstick 10/19/2015 NEGATIVE  Negative Final  . Urobilinogen, UA 10/19/2015 0.2  0.0 - 1.0 Final  . Leukocytes, UA 10/19/2015 SMALL* Negative Final  . Nitrite 10/19/2015 NEGATIVE  Negative Final  . WBC, UA 10/19/2015 7-10/hpf* 0-2/hpf Final  . RBC / HPF 10/19/2015 0-2/hpf  0-2/hpf Final  . Squamous Epithelial / LPF 10/19/2015 Rare(0-4/hpf)  Rare(0-4/hpf) Final  . TSH 10/19/2015 1.52  0.35 - 4.50 uIU/mL Final      Medication List       Accurate as of 10/22/15  9:48 AM. Always use your most recent med list.          ACCU-CHEK AVIVA PLUS w/Device Kit Use to check blood sugar 3-4 times per day.   ACCU-CHEK SOFTCLIX LANCETS lancets   ALPRAZolam 0.5 MG tablet Commonly known as:  XANAX Take 0.5 mg by mouth daily as needed for anxiety.   amLODipine 5 MG tablet Commonly known as:  NORVASC   aspirin EC 81 MG tablet Take 81 mg by mouth daily.   atorvastatin 10 MG tablet Commonly known as:  LIPITOR Take 1 tablet (10 mg total) by mouth daily.   bisacodyl 5 MG EC tablet Commonly known as:  DULCOLAX Take 5 mg by mouth daily.   cetirizine 10 MG tablet Commonly known as:  ZYRTEC Take 10 mg by mouth daily as needed for allergies.   cyclobenzaprine 5 MG tablet Commonly known as:  FLEXERIL   glucose blood test strip Commonly known as:  ONETOUCH VERIO Use as instructed to check blood sugar 4 times per day dx code 250.00   HYDROcodone-acetaminophen 10-325 MG tablet Commonly known as:  NORCO Take 1 tablet by mouth every 6 (six) hours as needed for  pain.   insulin aspart 100 UNIT/ML injection Commonly known as:  NOVOLOG Inject 8 units at breakfast  and lunch and 16 unit at dinner   insulin lispro 100 UNIT/ML KiwkPen Commonly known as:  HUMALOG KWIKPEN Inject 8 units at breakfast and lunch and 16 units at dinner   insulin NPH Human 100 UNIT/ML injection Commonly known as:  NOVOLIN N RELION INJECT 12 UNITS SUBCUTANEOUSLY IN THE EVENING AND INJECT 8 UNITS IN THE MORNING   losartan-hydrochlorothiazide 100-25 MG tablet Commonly known as:  HYZAAR Take 1 tablet by mouth at bedtime.   metFORMIN 1000 MG tablet Commonly known as:  GLUCOPHAGE Take 0.5 tablets (500 mg total) by mouth daily. Take 1 tablet in am and 3 in pm   niacin 500 MG tablet Commonly known as:  SLO-NIACIN Take 500 mg by mouth at bedtime.   Vitamin D3 5000 units Caps Take 5,000 Units by mouth.       Allergies: No Known Allergies  Past Medical History:  Diagnosis Date  . Chronic kidney disease    kidney stone  . Diabetes mellitus without complication ([place])   . Hypertension   . Stroke [facility])     Past Surgical History:  Procedure Laterality Date  . HAND SURGERY    . NO PAST SURGERIES      No family history on file.  Social History:  reports that she has quit smoking. She has never used smokeless tobacco. She reports that she does not drink alcohol or use drugs.  Review of Systems:  HYPERTENSION:  she is on  losartan HCTZ and amlodipine,  followed by PCP.  Blood pressure is normal today.   HYPERLIPIDEMIA: The lipid abnormality consists of elevated LDL  Well controlled now with the resuming her statin drug, taking Lipitor because of drug interaction between  Simvastatin with her amlodipine She was told to reduce her niacin to 500 mg to avoid any effect on insulin resistance and her HDL is still fairly good   Lab Results  Component Value Date   CHOL 158 01/04/2015   HDL 51.30 01/04/2015   LDLCALC 94 01/04/2015   TRIG 61.0 01/04/2015   CHOLHDL  3 01/04/2015   Last diabetic foot exam in 9/16      Examination:   BP 128/68   Pulse 62   Ht 6' (1.829 m)   Wt 202 lb (91.6 kg)   SpO2 98%   BMI 27.40 kg/m   Body mass index is 27.4 kg/m.   No ankle edema Today  ASSESSMENT/ PLAN:   Diabetes type 2  See history of present illness for detailed discussion of his current management, blood sugar patterns and problems identified She has not been seen in follow-up for almost a year now  A1c is nearly 10% and this was discussed Not clear when she is having higher blood sugars as she is monitoring mostly fasting readings also not clear why some fasting readings are significantly high She probably does have some variability of blood sugars overnight because of taking NPH May also have high was prandial readings at times  Since Toujeo is covered by her insurance will give her a trial of this instead of NPH insulin She can do this once a day which would be more convenient and she prefers a pen anyway Discussed actions and timing of Toujeo insulin She can start with 22 units daily but discussed that this will be to be adjusted based on fasting blood sugars Currently she is not able to understand how to adjust insulin on her own and will have her call if blood sugars are out of  range in the morning  Also emphasize consistent diet, reducing snacks and checking readings after meals  Hypertension: Well controlled  HYPERLIPIDEMIA: She will need follow-up  Patient Instructions  Toujeo 22 units at bedtime when Novolin runs out  Humalog then will 8--10--16 at meals with Toujeo  More sugar testing:  Check blood sugars on waking up  Daily  Also check blood sugars about 2 hours after a meal and do this after different meals by rotation  Recommended blood sugar levels on waking up is 90-130 and about 2 hours after meal is 130-160  Please bring your blood sugar monitor to each visit, thank you     Counseling time on subjects  discussed above is over 50% of today's 25 minute visit  Tyaisha Cullom 10/22/2015, 9:48 AM   Note: This office note was prepared with Estate agent. Any transcriptional errors that result from this process are unintentional.

## 2015-10-22 NOTE — Patient Instructions (Addendum)
Toujeo 22 units at bedtime when Novolin runs out  Humalog then will 8--10--16 at meals with Toujeo  More sugar testing:  Check blood sugars on waking up  Daily  Also check blood sugars about 2 hours after a meal and do this after different meals by rotation  Recommended blood sugar levels on waking up is 90-130 and about 2 hours after meal is 130-160  Please bring your blood sugar monitor to each visit, thank you

## 2015-12-10 ENCOUNTER — Other Ambulatory Visit: Payer: Commercial Managed Care - HMO

## 2015-12-15 ENCOUNTER — Ambulatory Visit: Payer: Commercial Managed Care - HMO | Admitting: Endocrinology

## 2016-01-09 ENCOUNTER — Other Ambulatory Visit: Payer: Self-pay | Admitting: Endocrinology

## 2016-01-10 ENCOUNTER — Other Ambulatory Visit (HOSPITAL_COMMUNITY): Payer: Self-pay | Admitting: Family Medicine

## 2016-01-10 ENCOUNTER — Telehealth: Payer: Self-pay | Admitting: Endocrinology

## 2016-01-10 ENCOUNTER — Other Ambulatory Visit: Payer: Self-pay | Admitting: Family Medicine

## 2016-01-10 ENCOUNTER — Other Ambulatory Visit: Payer: Self-pay

## 2016-01-10 DIAGNOSIS — Z1231 Encounter for screening mammogram for malignant neoplasm of breast: Secondary | ICD-10-CM

## 2016-01-10 MED ORDER — INSULIN ASPART 100 UNIT/ML ~~LOC~~ SOLN: SUBCUTANEOUS | 1 refills | Status: DC

## 2016-01-10 NOTE — Telephone Encounter (Signed)
Pt called and said she needs her Novolog refilled and sent into the [facility] in [place].

## 2016-01-10 NOTE — Telephone Encounter (Signed)
Ordered 01/07/16

## 2016-01-11 ENCOUNTER — Other Ambulatory Visit (INDEPENDENT_AMBULATORY_CARE_PROVIDER_SITE_OTHER): Payer: Commercial Managed Care - HMO

## 2016-01-11 DIAGNOSIS — Z794 Long term (current) use of insulin: Secondary | ICD-10-CM | POA: Diagnosis not present

## 2016-01-11 DIAGNOSIS — E1165 Type 2 diabetes mellitus with hyperglycemia: Secondary | ICD-10-CM

## 2016-01-11 LAB — BASIC METABOLIC PANEL
BUN: 10 mg/dL (ref 6–23)
CHLORIDE: 108 meq/L (ref 96–112)
CO2: 29 mEq/L (ref 19–32)
Calcium: 9.1 mg/dL (ref 8.4–10.5)
Creatinine, Ser: 0.65 mg/dL (ref 0.40–1.20)
GFR: 113.67 mL/min (ref >60.00)
Glucose, Bld: 145 mg/dL — ABNORMAL HIGH (ref 70–99)
POTASSIUM: 3.9 meq/L (ref 3.5–5.1)
Sodium: 143 mEq/L (ref 135–145)

## 2016-01-11 LAB — LIPID PANEL
CHOLESTEROL: 166 mg/dL (ref 0–200)
HDL: 57.8 mg/dL (ref >39.00)
LDL CALC: 98 mg/dL (ref 0–99)
NonHDL: 108.33
TRIGLYCERIDES: 52 mg/dL (ref 0.0–149.0)
Total CHOL/HDL Ratio: 3
VLDL: 10.4 mg/dL (ref 0.0–40.0)

## 2016-01-12 LAB — FRUCTOSAMINE: FRUCTOSAMINE: 340 umol/L — AB (ref 0–285)

## 2016-01-14 ENCOUNTER — Ambulatory Visit (INDEPENDENT_AMBULATORY_CARE_PROVIDER_SITE_OTHER): Payer: Commercial Managed Care - HMO | Admitting: Endocrinology

## 2016-01-14 ENCOUNTER — Encounter: Payer: Self-pay | Admitting: Endocrinology

## 2016-01-14 VITALS — BP 128/68 | Ht 72.0 in | Wt 206.0 lb

## 2016-01-14 DIAGNOSIS — Z23 Encounter for immunization: Secondary | ICD-10-CM

## 2016-01-14 DIAGNOSIS — Z794 Long term (current) use of insulin: Secondary | ICD-10-CM

## 2016-01-14 DIAGNOSIS — I10 Essential (primary) hypertension: Secondary | ICD-10-CM | POA: Diagnosis not present

## 2016-01-14 DIAGNOSIS — E782 Mixed hyperlipidemia: Secondary | ICD-10-CM | POA: Diagnosis not present

## 2016-01-14 DIAGNOSIS — E1165 Type 2 diabetes mellitus with hyperglycemia: Secondary | ICD-10-CM | POA: Diagnosis not present

## 2016-01-14 LAB — POCT GLYCOSYLATED HEMOGLOBIN (HGB A1C): HEMOGLOBIN A1C: 9.8

## 2016-01-14 NOTE — Progress Notes (Signed)
Patient ID: Sandra Washington, female   DOB: [birth year], 75 y.o.   MRN: [id number]   Reason for Appointment: Diabetes follow-up   History of Present Illness   Diagnosis: Type 2 DIABETES MELITUS, date of diagnosis:   1995       She has been on insulin almost since her diagnosis and has had various regimens over the years More recently has been on basal bolus insulin with Lantus and Humalog with variable control, depending on her compliance with diet and exercise She has generally been having difficulty with consistent compliance in the last few months because of taking care of her husband  Recent history:  Insulin regimen:  Toujeo 22,  Humalog 10-05-14 ac tid   Her last A1c was still high at 9.9 and it is about the same at 9.8 now, fructosamine is also significantly high She is again irregular with her follow-up  Current blood sugar patterns, management and problems:  She is testing her blood sugar mostly in the morning   Her fasting blood sugars are sometimes significantly high but more consistently near normal in the last week or so  She is checking blood sugars sporadically later in the day and on the most readings are significantly high blood clear which readings are after meals, has a couple of good readings also  Appears to be having mostly high readings in the afternoons and evenings after meals  Has stopped going for exercise regimen because she thinks the water in the pool is cold  Oral hypoglycemic drugs:  metformin  1g bid     Side effects from medications: None           Proper timing of medications in relation to meals: Yes.         Monitors blood glucose: Once a day or less .    Glucometer:  Accu-Chek  Blood Glucose readings:   Mean values apply above for all meters except median for One Touch  PRE-MEAL Fasting Lunch 7 PM   Overall  Glucose range: 70-231  189  120-292  10 5-273    Mean/median: 160    170   Hypoglycemia: None        Meals: 3  meals per day.  breakfast is: Cereal or egg; eating out usually having sandwiches. Lunch  12:30pm       Physical activity: exercise:  is not doing water aerobics previously going 2-3/7       Dietician visit: Most recent: 08/3530         Complications: are: minimal      Wt Readings from Last 3 Encounters:  01/14/16 206 lb (93.4 kg)  10/22/15 202 lb (91.6 kg)  01/07/15 199 lb (90.3 kg)    Lab Results  Component Value Date   HGBA1C 9.8 01/14/2016   HGBA1C 9.9 (H) 10/19/2015   HGBA1C 8.3 (H) 10/27/2014   Lab Results  Component Value Date   MICROALBUR 1.1 10/19/2015   LDLCALC 98 01/11/2016   CREATININE 0.65 01/11/2016    OTHER problems discussed today: See review of systems   Office Visit on 01/14/2016  Component Date Value Ref Range Status  . Hemoglobin A1C 01/14/2016 9.8   Final  Lab on 01/11/2016  Component Date Value Ref Range Status  . Fructosamine 01/12/2016 340* 0 - 285 umol/L Final   Comment: Published reference interval for apparently healthy subjects between age 11 and 1 is 58 - 285 umol/L and in a poorly controlled diabetic population is 228 -  563 umol/L with a mean of 396 umol/L.   Mar[place] Kitchen Sodium 01/11/2016 143  135 - 145 mEq/L Final  . Potassium 01/11/2016 3.9  3.5 - 5.1 mEq/L Final  . Chloride 01/11/2016 108  96 - 112 mEq/L Final  . CO2 01/11/2016 29  19 - 32 mEq/L Final  . Glucose, Bld 01/11/2016 145* 70 - 99 mg/dL Final  . BUN 01/11/2016 10  6 - 23 mg/dL Final  . Creatinine, Ser 01/11/2016 0.65  0.40 - 1.20 mg/dL Final  . Calcium 01/11/2016 9.1  8.4 - 10.5 mg/dL Final  . GFR 01/11/2016 113.67  >60.00 mL/min Final  . Cholesterol 01/11/2016 166  0 - 200 mg/dL Final  . Triglycerides 01/11/2016 52.0  0.0 - 149.0 mg/dL Final  . HDL 01/11/2016 57.80  >39.00 mg/dL Final  . VLDL 01/11/2016 10.4  0.0 - 40.0 mg/dL Final  . LDL Cholesterol 01/11/2016 98  0 - 99 mg/dL Final  . Total CHOL/HDL Ratio 01/11/2016 3   Final  . NonHDL 01/11/2016 108.33   Final        Medication List       Accurate as of 01/14/16 11:59 PM. Always use your most recent med list.          ACCU-CHEK AVIVA PLUS w/Device Kit Use to check blood sugar 3-4 times per day.   ACCU-CHEK SOFTCLIX LANCETS lancets   ALPRAZolam 0.5 MG tablet Commonly known as:  XANAX Take 0.5 mg by mouth daily as needed for anxiety.   amLODipine 5 MG tablet Commonly known as:  NORVASC   aspirin EC 81 MG tablet Take 81 mg by mouth daily.   atorvastatin 10 MG tablet Commonly known as:  LIPITOR Take 1 tablet (10 mg total) by mouth daily.   bisacodyl 5 MG EC tablet Commonly known as:  DULCOLAX Take 5 mg by mouth daily.   cetirizine 10 MG tablet Commonly known as:  ZYRTEC Take 10 mg by mouth daily as needed for allergies.   cyclobenzaprine 5 MG tablet Commonly known as:  FLEXERIL   glucose blood test strip Commonly known as:  ONETOUCH VERIO Use as instructed to check blood sugar 4 times per day dx code 250.00   HYDROcodone-acetaminophen 10-325 MG tablet Commonly known as:  NORCO Take 1 tablet by mouth every 6 (six) hours as needed for pain.   insulin aspart 100 UNIT/ML injection Commonly known as:  NOVOLOG INJECT 8 UNITS SUBCUTANEOUSLY AT BREAKFAST AND LUNCH AND 16 UNITS AT DINNER.   Insulin Glargine 300 UNIT/ML Sopn Commonly known as:  TOUJEO SOLOSTAR Inject 22 Units into the skin daily.   insulin lispro 100 UNIT/ML KiwkPen Commonly known as:  HUMALOG KWIKPEN Inject 8 units at breakfast and lunch and 16 units at dinner   insulin NPH Human 100 UNIT/ML injection Commonly known as:  NOVOLIN N RELION INJECT 12 UNITS SUBCUTANEOUSLY IN THE EVENING AND INJECT 8 UNITS IN THE MORNING   losartan-hydrochlorothiazide 100-25 MG tablet Commonly known as:  HYZAAR Take 1 tablet by mouth at bedtime.   metFORMIN 1000 MG tablet Commonly known as:  GLUCOPHAGE Take 0.5 tablets (500 mg total) by mouth daily. Take 1 tablet in am and 3 in pm   niacin 500 MG tablet Commonly known as:   SLO-NIACIN Take 500 mg by mouth at bedtime.   Vitamin D3 5000 units Caps Take 5,000 Units by mouth.       Allergies: No Known Allergies  Past Medical History:  Diagnosis Date  . Chronic kidney disease  kidney stone  . Diabetes mellitus without complication ([place])   . Hypertension   . Stroke [facility])     Past Surgical History:  Procedure Laterality Date  . HAND SURGERY    . NO PAST SURGERIES      No family history on file.  Social History:  reports that she has quit smoking. She has never used smokeless tobacco. She reports that she does not drink alcohol or use drugs.  Review of Systems:  HYPERTENSION:  she is on  losartan HCTZ and amlodipine,  followed by PCP.  Blood pressure is Usually consistently normal  HYPERLIPIDEMIA: The lipid abnormality consists of elevated LDL  Well controlled now with Lipitor 10 mg and niacin 500 mg   Lab Results  Component Value Date   CHOL 166 01/11/2016   HDL 57.80 01/11/2016   LDLCALC 98 01/11/2016   TRIG 52.0 01/11/2016   CHOLHDL 3 01/11/2016   Last diabetic foot exam in 9/16      Examination:   BP 128/68   Ht 6' (1.829 m)   Wt 206 lb (93.4 kg)   BMI 27.94 kg/m   Body mass index is 27.94 kg/m.   No ankle edema    ASSESSMENT/ PLAN:   Diabetes type 2  See history of present illness for detailed discussion of  current management, blood sugar patterns and problems identified He has had various personal issues and has not come back for follow-up for 3 months now from her last visit Most likely because of her difficulties with consistent compliance with diet, monitoring and also stress issues she has variable blood sugar Not clear why fasting readings are also fluctuating at times Blood sugars are mostly higher after meals recently although not checking enough  With her consistent difficulty in getting adequate control.  Discussed the option of using the V-go pump today Explained how this works and probability that it  would require less insulin and with provide more consistent control especially with mealtime insulin She is interested in a trial of this and will set this up with the nurse educator Encouraged her to be more active, currently not exercising and this may help Emphasized the need for checking blood sugars more consistently including after meals She can review her diet also with the nurse educator  Meanwhile will increase her Humalog 10 units at breakfast and lunch and 16-18 at suppertime based on meal size  V-GO pump settings: Basal = 20 units, boluses 8 units at breakfast and lunch and 12-14 at suppertime to start with  Hypertension: Well controlled  HYPERLIPIDEMIA: She does have good control now and will continue Lipitor and low dose niacin     Patient Instructions  Check blood sugars on waking up 3-4x per week   Also check blood sugars about 2 hours after a meal and do this after different meals by rotation  Recommended blood sugar levels on waking up is 90-130 and about 2 hours after meal is 130-160  Please bring your blood sugar monitor to each visit, thank you  Humalog 10 at Bfst and lunch and 16-18 at supper         Counseling time on subjects discussed above is over 50% of today's 25 minute visit  Sandra Washington 01/16/2016, 9:11 PM   Note: This office note was prepared with Dragon voice recognition system technology. Any transcriptional errors that result from this process are unintentional.

## 2016-01-14 NOTE — Patient Instructions (Signed)
Check blood sugars on waking up 3-4x per week   Also check blood sugars about 2 hours after a meal and do this after different meals by rotation  Recommended blood sugar levels on waking up is 90-130 and about 2 hours after meal is 130-160  Please bring your blood sugar monitor to each visit, thank you  Humalog 10 at Bfst and lunch and 16-18 at supper

## 2016-01-17 ENCOUNTER — Ambulatory Visit (HOSPITAL_COMMUNITY)
Admission: RE | Admit: 2016-01-17 | Discharge: 2016-01-17 | Disposition: A | Discharge status: Home / Self Care | Payer: Commercial Managed Care - HMO | Source: Ambulatory Visit | Attending: Family Medicine | Admitting: Family Medicine

## 2016-01-17 DIAGNOSIS — Z1231 Encounter for screening mammogram for malignant neoplasm of breast: Secondary | ICD-10-CM | POA: Diagnosis not present

## 2016-01-28 ENCOUNTER — Other Ambulatory Visit: Payer: Self-pay

## 2016-01-28 ENCOUNTER — Telehealth: Payer: Self-pay | Admitting: Endocrinology

## 2016-01-28 MED ORDER — GLUCOSE BLOOD VI STRP: ORAL_STRIP | 12 refills | Status: DC

## 2016-01-28 NOTE — Telephone Encounter (Signed)
Ordered 01/28/16

## 2016-01-28 NOTE — Telephone Encounter (Signed)
Accu check test strips. [facility] [place], [place] - [place] [address] [phone] (Phone) [phone] (Fax)

## 2016-02-01 ENCOUNTER — Encounter: Payer: Commercial Managed Care - HMO | Attending: Endocrinology | Admitting: Nutrition

## 2016-02-01 DIAGNOSIS — Z794 Long term (current) use of insulin: Secondary | ICD-10-CM | POA: Insufficient documentation

## 2016-02-01 DIAGNOSIS — M5416 Radiculopathy, lumbar region: Secondary | ICD-10-CM | POA: Diagnosis not present

## 2016-02-01 DIAGNOSIS — E1165 Type 2 diabetes mellitus with hyperglycemia: Secondary | ICD-10-CM | POA: Diagnosis not present

## 2016-02-01 DIAGNOSIS — E118 Type 2 diabetes mellitus with unspecified complications: Secondary | ICD-10-CM

## 2016-02-01 DIAGNOSIS — I10 Essential (primary) hypertension: Secondary | ICD-10-CM | POA: Diagnosis not present

## 2016-02-01 DIAGNOSIS — Z713 Dietary counseling and surveillance: Secondary | ICD-10-CM | POA: Insufficient documentation

## 2016-02-01 DIAGNOSIS — Z6832 Body mass index (BMI) 32.0-32.9, adult: Secondary | ICD-10-CM | POA: Diagnosis not present

## 2016-02-01 NOTE — Progress Notes (Signed)
This patient was shown how to use the V-go 20.  She did not want to start this at this time.  She filled out paperwork to have the [employer] coverage, and to see how much it would cost her.  She will contact me when she hears from them,and let me know if she would like to try this.  Questions were answered about giving meal boluses and what do to if she skips a meal.  She was given handout with information on this device and a telephone number to call if more questions.

## 2016-02-04 ENCOUNTER — Ambulatory Visit: Payer: Commercial Managed Care - HMO | Admitting: Endocrinology

## 2016-02-11 ENCOUNTER — Other Ambulatory Visit: Payer: Self-pay | Admitting: Neurosurgery

## 2016-02-11 ENCOUNTER — Other Ambulatory Visit (HOSPITAL_COMMUNITY): Payer: Self-pay | Admitting: Neurosurgery

## 2016-02-11 DIAGNOSIS — M5416 Radiculopathy, lumbar region: Secondary | ICD-10-CM

## 2016-02-14 NOTE — Telephone Encounter (Signed)
Pt is not going to be able to get the pump, it has been denied because she has not met her deductible

## 2016-02-14 NOTE — Telephone Encounter (Signed)
Needs f/u appt in 6 weeks with labs

## 2016-02-15 DIAGNOSIS — F430 Acute stress reaction: Secondary | ICD-10-CM | POA: Diagnosis not present

## 2016-02-15 DIAGNOSIS — E089 Diabetes mellitus due to underlying condition without complications: Secondary | ICD-10-CM | POA: Diagnosis not present

## 2016-02-15 DIAGNOSIS — Z0000 Encounter for general adult medical examination without abnormal findings: Secondary | ICD-10-CM | POA: Diagnosis not present

## 2016-02-15 DIAGNOSIS — E782 Mixed hyperlipidemia: Secondary | ICD-10-CM | POA: Diagnosis not present

## 2016-02-15 DIAGNOSIS — I10 Essential (primary) hypertension: Secondary | ICD-10-CM | POA: Diagnosis not present

## 2016-02-15 DIAGNOSIS — E119 Type 2 diabetes mellitus without complications: Secondary | ICD-10-CM | POA: Diagnosis not present

## 2016-02-17 ENCOUNTER — Ambulatory Visit (HOSPITAL_COMMUNITY)
Admission: RE | Admit: 2016-02-17 | Discharge: 2016-02-17 | Disposition: A | Discharge status: Home / Self Care | Payer: Commercial Managed Care - HMO | Source: Ambulatory Visit | Attending: Neurosurgery | Admitting: Neurosurgery

## 2016-02-17 DIAGNOSIS — M5416 Radiculopathy, lumbar region: Secondary | ICD-10-CM

## 2016-02-17 DIAGNOSIS — M4807 Spinal stenosis, lumbosacral region: Secondary | ICD-10-CM | POA: Diagnosis not present

## 2016-02-17 DIAGNOSIS — M48061 Spinal stenosis, lumbar region without neurogenic claudication: Secondary | ICD-10-CM | POA: Insufficient documentation

## 2016-02-17 DIAGNOSIS — M5127 Other intervertebral disc displacement, lumbosacral region: Secondary | ICD-10-CM | POA: Insufficient documentation

## 2016-02-17 DIAGNOSIS — M545 Low back pain: Secondary | ICD-10-CM | POA: Diagnosis not present

## 2016-02-17 DIAGNOSIS — M47896 Other spondylosis, lumbar region: Secondary | ICD-10-CM | POA: Diagnosis not present

## 2016-02-17 DIAGNOSIS — M5126 Other intervertebral disc displacement, lumbar region: Secondary | ICD-10-CM | POA: Insufficient documentation

## 2016-02-18 DIAGNOSIS — E119 Type 2 diabetes mellitus without complications: Secondary | ICD-10-CM | POA: Diagnosis not present

## 2016-02-18 DIAGNOSIS — H5202 Hypermetropia, left eye: Secondary | ICD-10-CM | POA: Diagnosis not present

## 2016-02-18 DIAGNOSIS — H5211 Myopia, right eye: Secondary | ICD-10-CM | POA: Diagnosis not present

## 2016-02-18 DIAGNOSIS — H52223 Regular astigmatism, bilateral: Secondary | ICD-10-CM | POA: Diagnosis not present

## 2016-02-24 DIAGNOSIS — Z6832 Body mass index (BMI) 32.0-32.9, adult: Secondary | ICD-10-CM | POA: Diagnosis not present

## 2016-02-24 DIAGNOSIS — I10 Essential (primary) hypertension: Secondary | ICD-10-CM | POA: Diagnosis not present

## 2016-02-24 DIAGNOSIS — M5126 Other intervertebral disc displacement, lumbar region: Secondary | ICD-10-CM | POA: Diagnosis not present

## 2016-02-24 DIAGNOSIS — M5416 Radiculopathy, lumbar region: Secondary | ICD-10-CM | POA: Diagnosis not present

## 2016-02-26 ENCOUNTER — Other Ambulatory Visit: Payer: Self-pay | Admitting: Endocrinology

## 2016-02-28 HISTORY — PX: CATARACT EXTRACTION: SUR2

## 2016-03-21 DIAGNOSIS — H18413 Arcus senilis, bilateral: Secondary | ICD-10-CM | POA: Diagnosis not present

## 2016-03-21 DIAGNOSIS — H26492 Other secondary cataract, left eye: Secondary | ICD-10-CM | POA: Diagnosis not present

## 2016-03-21 DIAGNOSIS — H26493 Other secondary cataract, bilateral: Secondary | ICD-10-CM | POA: Diagnosis not present

## 2016-03-21 DIAGNOSIS — H26491 Other secondary cataract, right eye: Secondary | ICD-10-CM | POA: Diagnosis not present

## 2016-03-21 DIAGNOSIS — H02839 Dermatochalasis of unspecified eye, unspecified eyelid: Secondary | ICD-10-CM | POA: Diagnosis not present

## 2016-03-21 DIAGNOSIS — Z961 Presence of intraocular lens: Secondary | ICD-10-CM | POA: Diagnosis not present

## 2016-03-22 ENCOUNTER — Other Ambulatory Visit: Payer: Self-pay | Admitting: Endocrinology

## 2016-03-23 ENCOUNTER — Telehealth: Payer: Self-pay | Admitting: Endocrinology

## 2016-03-23 NOTE — Telephone Encounter (Signed)
Pt called in requesting refills of her Metformin and Toujeo sent to the [facility] on [address].

## 2016-03-24 ENCOUNTER — Other Ambulatory Visit: Payer: Self-pay | Admitting: Endocrinology

## 2016-03-24 ENCOUNTER — Other Ambulatory Visit: Payer: Self-pay

## 2016-03-24 MED ORDER — INSULIN GLARGINE 300 UNIT/ML ~~LOC~~ SOPN: 22.0000 [IU] | PEN_INJECTOR | Freq: Every day | SUBCUTANEOUS | 1 refills | Status: DC

## 2016-03-24 MED ORDER — METFORMIN HCL 1000 MG PO TABS: 500.0000 mg | ORAL_TABLET | Freq: Every day | ORAL | 1 refills | Status: DC

## 2016-03-24 NOTE — Telephone Encounter (Signed)
Ordered 03/24/16 

## 2016-03-26 ENCOUNTER — Other Ambulatory Visit: Payer: Self-pay | Admitting: Endocrinology

## 2016-03-26 DIAGNOSIS — Z794 Long term (current) use of insulin: Principal | ICD-10-CM

## 2016-03-26 DIAGNOSIS — E1165 Type 2 diabetes mellitus with hyperglycemia: Secondary | ICD-10-CM

## 2016-03-27 ENCOUNTER — Other Ambulatory Visit (INDEPENDENT_AMBULATORY_CARE_PROVIDER_SITE_OTHER): Payer: Medicare HMO

## 2016-03-27 DIAGNOSIS — E1165 Type 2 diabetes mellitus with hyperglycemia: Secondary | ICD-10-CM | POA: Diagnosis not present

## 2016-03-27 DIAGNOSIS — Z794 Long term (current) use of insulin: Secondary | ICD-10-CM | POA: Diagnosis not present

## 2016-03-27 LAB — BASIC METABOLIC PANEL
BUN: 12 mg/dL (ref 6–23)
CHLORIDE: 108 meq/L (ref 96–112)
CO2: 27 meq/L (ref 19–32)
CREATININE: 0.64 mg/dL (ref 0.40–1.20)
Calcium: 9.4 mg/dL (ref 8.4–10.5)
GFR: 115.66 mL/min (ref >60.00)
Glucose, Bld: 126 mg/dL — ABNORMAL HIGH (ref 70–99)
Potassium: 4.2 mEq/L (ref 3.5–5.1)
Sodium: 142 mEq/L (ref 135–145)

## 2016-03-27 NOTE — Telephone Encounter (Signed)
Patient medication Toujeo was to expensive, she haven't had any insulin in a week. Please advise

## 2016-03-28 ENCOUNTER — Telehealth: Payer: Self-pay

## 2016-03-28 DIAGNOSIS — M5416 Radiculopathy, lumbar region: Secondary | ICD-10-CM | POA: Diagnosis not present

## 2016-03-28 DIAGNOSIS — M5126 Other intervertebral disc displacement, lumbar region: Secondary | ICD-10-CM | POA: Diagnosis not present

## 2016-03-28 LAB — FRUCTOSAMINE: Fructosamine: 312 umol/L — ABNORMAL HIGH (ref 0–285)

## 2016-03-28 NOTE — Telephone Encounter (Signed)
She needs to find out if Sandra Washington is covered better

## 2016-03-28 NOTE — Telephone Encounter (Addendum)
Patient stated she has been out of meds since last Friday, she is calling on the status of last message.

## 2016-03-28 NOTE — Telephone Encounter (Signed)
Patient calling states she can not afford toujeo can you please prescribe something cheaper

## 2016-03-29 ENCOUNTER — Telehealth: Payer: Self-pay

## 2016-03-29 MED ORDER — INSULIN DEGLUDEC 100 UNIT/ML ~~LOC~~ SOPN: PEN_INJECTOR | SUBCUTANEOUS | 1 refills | Status: DC

## 2016-03-29 NOTE — Telephone Encounter (Signed)
Tresiba called in ?

## 2016-03-29 NOTE — Telephone Encounter (Signed)
Called and notified patient new rx was submitted. No questions at this time.

## 2016-03-29 NOTE — Telephone Encounter (Signed)
Called and notified patient of medication change. No questions at this time.

## 2016-03-29 NOTE — Telephone Encounter (Signed)
Change Toujeo to [place] U-100 same dose

## 2016-03-30 ENCOUNTER — Ambulatory Visit: Payer: Commercial Managed Care - HMO | Admitting: Endocrinology

## 2016-03-30 ENCOUNTER — Ambulatory Visit (INDEPENDENT_AMBULATORY_CARE_PROVIDER_SITE_OTHER): Payer: Medicare HMO | Admitting: Endocrinology

## 2016-03-30 ENCOUNTER — Encounter: Payer: Self-pay | Admitting: Endocrinology

## 2016-03-30 VITALS — BP 126/86 | HR 79 | Ht 72.0 in | Wt 199.0 lb

## 2016-03-30 DIAGNOSIS — Z794 Long term (current) use of insulin: Secondary | ICD-10-CM

## 2016-03-30 DIAGNOSIS — E1165 Type 2 diabetes mellitus with hyperglycemia: Secondary | ICD-10-CM

## 2016-03-30 MED ORDER — INSULIN NPH (HUMAN) (ISOPHANE) 100 UNIT/ML ~~LOC~~ SUSP: SUBCUTANEOUS | 3 refills | Status: DC

## 2016-03-30 NOTE — Patient Instructions (Addendum)
Metformin 1000mg  2x daily  May mix insulins in am

## 2016-03-30 NOTE — Progress Notes (Signed)
Patient ID: Sandra Washington, female   DOB: 1941/01/21, 76 y.o.   MRN: [id number]   Reason for Appointment: Diabetes follow-up   History of Present Illness   Diagnosis: Type 2 DIABETES MELITUS, date of diagnosis:   1995       She has been on insulin almost since her diagnosis and has had various regimens over the years More recently has been on basal bolus insulin with Lantus and Humalog with variable control, depending on her compliance with diet and exercise She has generally been having difficulty with consistent compliance in the last few months because of taking care of her husband  Recent history:  Insulin regimen:  Toujeo 22,  Novolog 10-05-14 ac tid   Her last A1c was still high at 9.8, fructosamine is also significantly high She is again irregular with her follow-up  Current blood sugar patterns, management and problems:  She has not been able to get her Toujeo for the last week or so because of the cost and Tyler Aas was also too expensive  For some reason she is taking only 500 mg at home and twice a day instead of 1000  Her fasting blood sugars are not consistently high even in the last few days and again not clear why she has variability, however they are averaging about 130  She has mostly high readings in the afternoons and evenings although checking with his sporadically, blood sugars are higher in the last 10 days partly because of getting steroid injection  Has several readings over 200 in the afternoons and evenings and not clear if some of her evening readings are before eating  Has not been able to exercise much  Her weight has come down relatively  Oral hypoglycemic drugs:  metformin  0.5g bid     Side effects from medications: None           Proper timing of medications in relation to meals: Yes.         Monitors blood glucose: Once a day or less .    Glucometer:  Accu-Chek  Blood Glucose readings:   Mean values apply above for all meters  except median for One Touch  PRE-MEAL Fasting Lunch Dinner Bedtime Overall  Glucose range: 92-218  264  97-267  230    Mean/median: 131     153    POST-MEAL PC Breakfast PC Lunch PC Dinner  Glucose range:  93, 271    Mean/median:        Hypoglycemia: None        Meals: 3 meals per day.  breakfast is: Cereal or egg; eating out usually having sandwiches. Lunch  12:30pm       Physical activity: exercise:  is not doing any recently, having some musculoskeletal issues Previously doing water aerobics       Dietician visit: Most recent: 10/3808         Complications: are: minimal      Wt Readings from Last 3 Encounters:  03/30/16 199 lb (90.3 kg)  01/14/16 206 lb (93.4 kg)  10/22/15 202 lb (91.6 kg)    Lab Results  Component Value Date   HGBA1C 9.8 01/14/2016   HGBA1C 9.9 (H) 10/19/2015   HGBA1C 8.3 (H) 10/27/2014   Lab Results  Component Value Date   MICROALBUR 1.1 10/19/2015   LDLCALC 98 01/11/2016   CREATININE 0.64 03/27/2016    OTHER problems discussed today: See review of systems   Lab on 03/27/2016  Component  Date Value Ref Range Status  . Fructosamine 03/27/2016 312* 0 - 285 umol/L Final   Comment: Published reference interval for apparently healthy subjects between age 55 and 33 is 53 - 285 umol/L and in a poorly controlled diabetic population is 228 - 563 umol/L with a mean of 396 umol/L.   Marland Kitchen Sodium 03/27/2016 142  135 - 145 mEq/L Final  . Potassium 03/27/2016 4.2  3.5 - 5.1 mEq/L Final  . Chloride 03/27/2016 108  96 - 112 mEq/L Final  . CO2 03/27/2016 27  19 - 32 mEq/L Final  . Glucose, Bld 03/27/2016 126* 70 - 99 mg/dL Final  . BUN 03/27/2016 12  6 - 23 mg/dL Final  . Creatinine, Ser 03/27/2016 0.64  0.40 - 1.20 mg/dL Final  . Calcium 03/27/2016 9.4  8.4 - 10.5 mg/dL Final  . GFR 03/27/2016 115.66  >60.00 mL/min Final    Allergies as of 03/30/2016   No Known Allergies     Medication List       Accurate as of 03/30/16 11:59 PM. Always use your most  recent med list.          ACCU-CHEK AVIVA PLUS w/Device Kit Use to check blood sugar 3-4 times per day.   ACCU-CHEK SOFTCLIX LANCETS lancets   ALPRAZolam 0.5 MG tablet Commonly known as:  XANAX Take 0.5 mg by mouth daily as needed for anxiety.   amLODipine 5 MG tablet Commonly known as:  NORVASC   aspirin EC 81 MG tablet Take 81 mg by mouth daily.   atorvastatin 10 MG tablet Commonly known as:  LIPITOR Take 1 tablet (10 mg total) by mouth daily.   bisacodyl 5 MG EC tablet Commonly known as:  DULCOLAX Take 5 mg by mouth daily.   cetirizine 10 MG tablet Commonly known as:  ZYRTEC Take 10 mg by mouth daily as needed for allergies.   cyclobenzaprine 5 MG tablet Commonly known as:  FLEXERIL   glucose blood test strip Commonly known as:  ACCU-CHEK AVIVA PLUS Use to test blood sugar 4 times daily- Dx code E11.65   HYDROcodone-acetaminophen 10-325 MG tablet Commonly known as:  NORCO Take 1 tablet by mouth every 6 (six) hours as needed for pain.   insulin aspart 100 UNIT/ML injection Commonly known as:  NOVOLOG INJECT 8 UNITS SUBCUTANEOUSLY AT BREAKFAST AND LUNCH AND 16 UNITS AT DINNER.   insulin NPH Human 100 UNIT/ML injection Commonly known as:  NOVOLIN N RELION INJECT 12 UNITS SUBCUTANEOUSLY IN THE EVENING AND INJECT 8 UNITS IN THE MORNING   losartan-hydrochlorothiazide 100-25 MG tablet Commonly known as:  HYZAAR Take 1 tablet by mouth at bedtime.   metFORMIN 500 MG tablet Commonly known as:  GLUCOPHAGE TAKE ONE TABLET BY MOUTH ONCE DAILY IN THE MORNING AND THREE ONCE DAILY IN THE EVENING   metFORMIN 1000 MG tablet Commonly known as:  GLUCOPHAGE Take 0.5 tablets (500 mg total) by mouth daily. Take 1 tablet in am and 3 in pm   niacin 500 MG tablet Commonly known as:  SLO-NIACIN Take 500 mg by mouth at bedtime.   Vitamin D3 5000 units Caps Take 5,000 Units by mouth.       Allergies: No Known Allergies  Past Medical History:  Diagnosis Date  .  Chronic kidney disease    kidney stone  . Diabetes mellitus without complication ([place])   . Hypertension   . Stroke [facility])     Past Surgical History:  Procedure Laterality Date  . HAND SURGERY    .  NO PAST SURGERIES      No family history on file.  Social History:  reports that she has quit smoking. She has never used smokeless tobacco. She reports that she does not drink alcohol or use drugs.  Review of Systems:  HYPERTENSION:  she is on  Losartan HCTZ and amlodipine,  followed by PCP.   HYPERLIPIDEMIA: The lipid abnormality consists of elevated LDL  Well controlled  with Lipitor 10 mg and niacin 500 mg   Lab Results  Component Value Date   CHOL 166 01/11/2016   HDL 57.80 01/11/2016   LDLCALC 98 01/11/2016   TRIG 52.0 01/11/2016   CHOLHDL 3 01/11/2016   Last diabetic foot exam in 9/16      Examination:   BP 126/86   Pulse 79   Ht 6' (1.829 m)   Wt 199 lb (90.3 kg)   SpO2 95%   BMI 26.99 kg/m   Body mass index is 26.99 kg/m.     ASSESSMENT/ PLAN:   Diabetes type 2  See history of present illness for detailed discussion of  current management, blood sugar patterns and problems identified  Currently on basal bolus insulin and metformin  Her blood sugars overall still high Still having considerable issues affording her insulin It may be that all her brand name insulin regimens will not be covered well for long acting insulin at least Blood sugars aren't consistently high nonfasting but mostly higher lately She does not check in of readings to get a consistent pattern also  Recommended that she go back to the NPH and regular insulin twice a day as before instead of Toujeo which she cannot afford Explained to her that she can mix the 2 insulins in the morning, she is also using a syringe for the NovoLog  For now she can use 12 units in the morning and 8 units at bedtime For now also will not change her mealtime NovoLog insulin especially since she will  start NPH in the morning She does need to increase metformin to 1000 mg twice a day, usually tolerating this well and her renal function has been consistently normal also   Patient Instructions  Metformin 1040m 2x daily  May mix insulins in am      Counseling time on subjects discussed above is over 50% of today's 25 minute visit  Lashayla Armes 03/31/2016, 12:47 PM   Note: This office note was prepared with [place] agent Any transcriptional errors that result from this process are unintentional.

## 2016-03-31 ENCOUNTER — Other Ambulatory Visit: Payer: Self-pay

## 2016-03-31 MED ORDER — METFORMIN HCL 1000 MG PO TABS: ORAL_TABLET | ORAL | 3 refills | Status: DC

## 2016-04-04 DIAGNOSIS — H26491 Other secondary cataract, right eye: Secondary | ICD-10-CM | POA: Diagnosis not present

## 2016-04-10 ENCOUNTER — Other Ambulatory Visit: Payer: Self-pay

## 2016-04-10 MED ORDER — METFORMIN HCL 1000 MG PO TABS: ORAL_TABLET | ORAL | 3 refills | Status: DC

## 2016-04-20 DIAGNOSIS — M5416 Radiculopathy, lumbar region: Secondary | ICD-10-CM | POA: Diagnosis not present

## 2016-04-20 DIAGNOSIS — Z794 Long term (current) use of insulin: Secondary | ICD-10-CM | POA: Diagnosis not present

## 2016-04-20 DIAGNOSIS — E119 Type 2 diabetes mellitus without complications: Secondary | ICD-10-CM | POA: Diagnosis not present

## 2016-04-20 DIAGNOSIS — M5126 Other intervertebral disc displacement, lumbar region: Secondary | ICD-10-CM | POA: Diagnosis not present

## 2016-04-25 DIAGNOSIS — M5126 Other intervertebral disc displacement, lumbar region: Secondary | ICD-10-CM | POA: Diagnosis not present

## 2016-04-25 DIAGNOSIS — M5416 Radiculopathy, lumbar region: Secondary | ICD-10-CM | POA: Diagnosis not present

## 2016-04-27 ENCOUNTER — Telehealth: Payer: Self-pay | Admitting: Endocrinology

## 2016-04-27 NOTE — Telephone Encounter (Signed)
She needs to find out from insurance of Humalog is less expensive otherwise she will go to regular insulin Generic from [facility] OTC with a syringe instead of NovoLog

## 2016-04-27 NOTE — Telephone Encounter (Signed)
novolog is too expensive can we please call in a cheaper alternate  Please call into [facility]

## 2016-04-28 ENCOUNTER — Telehealth: Payer: Self-pay | Admitting: Endocrinology

## 2016-04-28 MED ORDER — INSULIN REGULAR HUMAN 100 UNIT/ML IJ SOLN: INTRAMUSCULAR | 5 refills | Status: DC

## 2016-04-28 NOTE — Telephone Encounter (Signed)
I contacted the patient and advised Rx for Novolin R has been submitted to replace the novolog. Patient voiced understanding. Rx submitted submitted to [facility].

## 2016-04-28 NOTE — Telephone Encounter (Signed)
Spoke to patient and she is calling her insurance company to find out and will call back to let me know

## 2016-04-28 NOTE — Telephone Encounter (Signed)
Patient is calling on the status of Novolin R Relion, she need the prescription today before you leave today.  She stated you said you would call her back.

## 2016-04-28 NOTE — Telephone Encounter (Signed)
Pt has called her insurance they are requesting we write her something for the OTC insulin  Humalog will be $2000

## 2016-04-28 NOTE — Telephone Encounter (Signed)
She will take the [facility] brand Novolin R relion, same dose as NovoLog, please prescribe

## 2016-05-01 NOTE — Telephone Encounter (Signed)
Done

## 2016-05-19 ENCOUNTER — Other Ambulatory Visit: Payer: Self-pay | Admitting: Endocrinology

## 2016-05-19 DIAGNOSIS — F064 Anxiety disorder due to known physiological condition: Secondary | ICD-10-CM | POA: Diagnosis not present

## 2016-05-19 DIAGNOSIS — E089 Diabetes mellitus due to underlying condition without complications: Secondary | ICD-10-CM | POA: Diagnosis not present

## 2016-05-19 DIAGNOSIS — E782 Mixed hyperlipidemia: Secondary | ICD-10-CM | POA: Diagnosis not present

## 2016-05-23 ENCOUNTER — Other Ambulatory Visit: Payer: Medicare HMO

## 2016-05-24 ENCOUNTER — Other Ambulatory Visit: Payer: Medicare HMO

## 2016-05-29 ENCOUNTER — Ambulatory Visit: Payer: Medicare HMO | Admitting: Endocrinology

## 2016-05-31 ENCOUNTER — Other Ambulatory Visit (INDEPENDENT_AMBULATORY_CARE_PROVIDER_SITE_OTHER): Payer: Medicare HMO

## 2016-05-31 DIAGNOSIS — E1165 Type 2 diabetes mellitus with hyperglycemia: Secondary | ICD-10-CM | POA: Diagnosis not present

## 2016-05-31 DIAGNOSIS — Z794 Long term (current) use of insulin: Secondary | ICD-10-CM

## 2016-05-31 LAB — BASIC METABOLIC PANEL
BUN: 14 mg/dL (ref 6–23)
CALCIUM: 9.6 mg/dL (ref 8.4–10.5)
CHLORIDE: 105 meq/L (ref 96–112)
CO2: 28 mEq/L (ref 19–32)
Creatinine, Ser: 0.74 mg/dL (ref 0.40–1.20)
GFR: 97.77 mL/min (ref >60.00)
Glucose, Bld: 130 mg/dL — ABNORMAL HIGH (ref 70–99)
Potassium: 4.5 mEq/L (ref 3.5–5.1)
Sodium: 139 mEq/L (ref 135–145)

## 2016-05-31 LAB — HEMOGLOBIN A1C: HEMOGLOBIN A1C: 8 % — AB (ref 4.6–6.5)

## 2016-06-07 ENCOUNTER — Ambulatory Visit (INDEPENDENT_AMBULATORY_CARE_PROVIDER_SITE_OTHER): Payer: Medicare HMO | Admitting: Endocrinology

## 2016-06-07 ENCOUNTER — Encounter: Payer: Self-pay | Admitting: Endocrinology

## 2016-06-07 VITALS — BP 128/60 | HR 84 | Ht 72.0 in | Wt 195.0 lb

## 2016-06-07 DIAGNOSIS — E1165 Type 2 diabetes mellitus with hyperglycemia: Secondary | ICD-10-CM | POA: Diagnosis not present

## 2016-06-07 DIAGNOSIS — I10 Essential (primary) hypertension: Secondary | ICD-10-CM

## 2016-06-07 DIAGNOSIS — Z794 Long term (current) use of insulin: Secondary | ICD-10-CM | POA: Diagnosis not present

## 2016-06-07 NOTE — Patient Instructions (Addendum)
N insulin 10 units at BEDTIME  If eating less at supper reduce R insulin from 16 to 10  Also take R insulin before the meal consistently  Check blood sugars on waking up 4x weekly    Also check blood sugars about 2 hours after a meal and do this after different meals by rotation  Recommended blood sugar levels on waking up is 90-130 and about 2 hours after meal is 130-160  Please bring your blood sugar monitor to each visit, thank you

## 2016-06-07 NOTE — Progress Notes (Signed)
Patient ID: Sandra Washington, female   DOB: [birth year], 76 y.o.   MRN: [id number]   Reason for Appointment: Diabetes follow-up   History of Present Illness   Diagnosis: Type 2 DIABETES MELITUS, date of diagnosis:   1995       She has been on insulin almost since her diagnosis and has had various regimens over the years More recently has been on basal bolus insulin with Lantus and Humalog with variable control, depending on her compliance with diet and exercise She has generally been having difficulty with consistent compliance in the last few months because of taking care of her husband  Recent history:  Insulin regimen:  N insulin 8--12,  Novolin R 10-05-14 ac tid   Her A1c is slightly better at 8%, previously 9.8  Current blood sugar patterns, management and problems:  She has now been taking generic insulins from [facility], previously on Toujeo and NovoLog  Although she was told to take NPH in the evening at bedtime she is taking this at suppertime  Also she was told to increase her metformin to 1000 mg twice a day on the last visit  She is checking blood sugars mostly FASTING  Her morning readings are periodically low including some low readings around 5 AM  Otherwise fasting readings are mostly near normal or low normal  She is checking blood sugars only sporadically later in the day and not clear if the readings around 7-8 PM are before eating, these are usually increased  However she has had low readings documented late at night at least 3 times  She previously had some epidural steroid, not clear what her sugars were before and after this  Oral hypoglycemic drugs:  metformin  1g bid     Side effects from medications: None           Proper timing of medications in relation to meals: Yes.         Monitors blood glucose: Once a day or less .    Glucometer:  Accu-Chek  Blood Glucose readings:   Mean values apply above for all meters except median for One  Touch  PRE-MEAL Fasting Lunch Dinner Bedtime Overall  Glucose range:  48-1 85  115-247  1 35-210  50-67    Mean/median: 105     117          Meals: 3 meals per day.  breakfast is: Cereal or egg; eating out usually having sandwiches. Lunch  12:30pm       Physical activity: exercise:  is not doing any recently, having musculoskeletal issues  Previously doing water aerobics       Dietician visit: Most recent: 07/4678         Complications: are: minimal      Wt Readings from Last 3 Encounters:  06/07/16 195 lb (88.5 kg)  03/30/16 199 lb (90.3 kg)  01/14/16 206 lb (93.4 kg)    Lab Results  Component Value Date   HGBA1C 8.0 (H) 05/31/2016   HGBA1C 9.8 01/14/2016   HGBA1C 9.9 (H) 10/19/2015   Lab Results  Component Value Date   MICROALBUR 1.1 10/19/2015   LDLCALC 98 01/11/2016   CREATININE 0.74 05/31/2016    OTHER problems discussed today: See review of systems   No visits with results within 1 Week(s) from this visit.  Latest known visit with results is:  Lab on 05/31/2016  Component Date Value Ref Range Status  . Hgb A1c MFr Bld 05/31/2016  8.0* 4.6 - 6.5 % Final  . Sodium 05/31/2016 139  135 - 145 mEq/L Final  . Potassium 05/31/2016 4.5  3.5 - 5.1 mEq/L Final  . Chloride 05/31/2016 105  96 - 112 mEq/L Final  . CO2 05/31/2016 28  19 - 32 mEq/L Final  . Glucose, Bld 05/31/2016 130* 70 - 99 mg/dL Final  . BUN 05/31/2016 14  6 - 23 mg/dL Final  . Creatinine, Ser 05/31/2016 0.74  0.40 - 1.20 mg/dL Final  . Calcium 05/31/2016 9.6  8.4 - 10.5 mg/dL Final  . GFR 05/31/2016 97.77  >60.00 mL/min Final    Allergies as of 06/07/2016   No Known Allergies     Medication List       Accurate as of 06/07/16  5:01 PM. Always use your most recent med list.          ACCU-CHEK AVIVA PLUS w/Device Kit Use to check blood sugar 3-4 times per day.   ACCU-CHEK SOFTCLIX LANCETS lancets   ALPRAZolam 0.5 MG tablet Commonly known as:  XANAX Take 0.5 mg by mouth daily as needed for  anxiety.   amLODipine 5 MG tablet Commonly known as:  NORVASC   aspirin EC 81 MG tablet Take 81 mg by mouth daily.   atorvastatin 10 MG tablet Commonly known as:  LIPITOR TAKE ONE TABLET BY MOUTH ONCE DAILY   bisacodyl 5 MG EC tablet Commonly known as:  DULCOLAX Take 5 mg by mouth daily.   cetirizine 10 MG tablet Commonly known as:  ZYRTEC Take 10 mg by mouth daily as needed for allergies.   cyclobenzaprine 5 MG tablet Commonly known as:  FLEXERIL   glucose blood test strip Commonly known as:  ACCU-CHEK AVIVA PLUS Use to test blood sugar 4 times daily- Dx code E11.65   HYDROcodone-acetaminophen 10-325 MG tablet Commonly known as:  NORCO Take 1 tablet by mouth every 6 (six) hours as needed for pain.   insulin NPH Human 100 UNIT/ML injection Commonly known as:  NOVOLIN N RELION INJECT 12 UNITS SUBCUTANEOUSLY IN THE EVENING AND INJECT 8 UNITS IN THE MORNING   insulin regular 250 units/2.68m (100 units/mL) injection Commonly known as:  NOVOLIN R RELION 8 units with breakfast and lunch 16 units with dinner.   losartan-hydrochlorothiazide 100-25 MG tablet Commonly known as:  HYZAAR Take 1 tablet by mouth at bedtime.   metFORMIN 1000 MG tablet Commonly known as:  GLUCOPHAGE Take 1 tablet in the morning and 1 tablets in the evening   niacin 500 MG tablet Commonly known as:  SLO-NIACIN Take 500 mg by mouth at bedtime.   Vitamin D3 5000 units Caps Take 5,000 Units by mouth.       Allergies: No Known Allergies  Past Medical History:  Diagnosis Date  . Chronic kidney disease    kidney stone  . Diabetes mellitus without complication ([place]   . Hypertension   . Stroke ([facility]     Past Surgical History:  Procedure Laterality Date  . HAND SURGERY    . NO PAST SURGERIES      No family history on file.  Social History:  reports that she has quit smoking. She has never used smokeless tobacco. She reports that she does not drink alcohol or use drugs.  Review of  Systems:  HYPERTENSION:  she is on  Losartan HCTZ and amlodipine,  followed by PCP.   HYPERLIPIDEMIA:  Well controlled  with Lipitor 10 mg and niacin 500 mg, Niacin has been prescribed  by her PCP    Lab Results  Component Value Date   CHOL 166 01/11/2016   HDL 57.80 01/11/2016   LDLCALC 98 01/11/2016   TRIG 52.0 01/11/2016   CHOLHDL 3 01/11/2016   Last diabetic foot exam in 9/16      Examination:   BP 128/60   Pulse 84   Ht 6' (1.829 m)   Wt 195 lb (88.5 kg)   BMI 26.45 kg/m   Body mass index is 26.45 kg/m.     ASSESSMENT/ PLAN:   Diabetes type 2  See history of present illness for detailed discussion of  current management, blood sugar patterns and problems identified  Currently Her A1c is improving and down to 8.0 on basal bolus insulin and metformin  She may be benefiting from increasing metformin with better control However her blood sugars are erratic at times She does not understand the need to take NPH in the evening at bedtime and is still taking this at suppertime With this she is getting some hypoglycemia late at night and early morning; also is taking her insulin doses for NPH backwards compared to her instructions on the last visit However most likely does not need as much NPH at night also  She is also not regulating her mealtime doses based on what she is eating; difficult to assess her postprandial readings as she is checking these very sporadically  Recommendations:  Explained to her that she can mix the 2 insulins in the morning Also recommended that she takes her evening NPH at bedtime only She was reduced to nighttime dose to 10 units instead of 12 To take regular insulin consistently before meals at least 15 minutes before She will reduce the suppertime dose to 10 units if eating a small meal More readings after meals Resume exercise when able to No change in metformin   Patient Instructions  N insulin 10 units at BEDTIME  If eating  less at supper reduce R insulin from 16 to 10  Also take R insulin before the meal consistently  Check blood sugars on waking up 4x weekly    Also check blood sugars about 2 hours after a meal and do this after different meals by rotation  Recommended blood sugar levels on waking up is 90-130 and about 2 hours after meal is 130-160  Please bring your blood sugar monitor to each visit, thank you     Counseling time on subjects discussed above is over 50% of today's 25 minute visit  Arion Shankles 06/07/2016, 5:01 PM   Note: This office note was prepared with Estate agent. Any transcriptional errors that result from this process are unintentional.

## 2016-06-23 ENCOUNTER — Other Ambulatory Visit: Payer: Medicare HMO

## 2016-07-18 DIAGNOSIS — E089 Diabetes mellitus due to underlying condition without complications: Secondary | ICD-10-CM | POA: Diagnosis not present

## 2016-07-18 DIAGNOSIS — E782 Mixed hyperlipidemia: Secondary | ICD-10-CM | POA: Diagnosis not present

## 2016-07-18 DIAGNOSIS — I10 Essential (primary) hypertension: Secondary | ICD-10-CM | POA: Diagnosis not present

## 2016-07-18 DIAGNOSIS — F064 Anxiety disorder due to known physiological condition: Secondary | ICD-10-CM | POA: Diagnosis not present

## 2016-09-01 ENCOUNTER — Other Ambulatory Visit: Payer: Medicare HMO

## 2016-09-06 ENCOUNTER — Ambulatory Visit: Payer: Medicare HMO | Admitting: Endocrinology

## 2016-09-21 ENCOUNTER — Other Ambulatory Visit (INDEPENDENT_AMBULATORY_CARE_PROVIDER_SITE_OTHER): Payer: Medicare HMO

## 2016-09-21 DIAGNOSIS — E1165 Type 2 diabetes mellitus with hyperglycemia: Secondary | ICD-10-CM | POA: Diagnosis not present

## 2016-09-21 DIAGNOSIS — Z794 Long term (current) use of insulin: Secondary | ICD-10-CM | POA: Diagnosis not present

## 2016-09-21 LAB — COMPREHENSIVE METABOLIC PANEL
ALBUMIN: 3.9 g/dL (ref 3.5–5.2)
ALK PHOS: 47 U/L (ref 39–117)
ALT: 11 U/L (ref 0–35)
AST: 14 U/L (ref 0–37)
BILIRUBIN TOTAL: 0.4 mg/dL (ref 0.2–1.2)
BUN: 15 mg/dL (ref 6–23)
CALCIUM: 9.8 mg/dL (ref 8.4–10.5)
CO2: 27 mEq/L (ref 19–32)
Chloride: 110 mEq/L (ref 96–112)
Creatinine, Ser: 0.69 mg/dL (ref 0.40–1.20)
GFR: 105.91 mL/min (ref >60.00)
Glucose, Bld: 95 mg/dL (ref 70–99)
Potassium: 4.4 mEq/L (ref 3.5–5.1)
Sodium: 141 mEq/L (ref 135–145)
TOTAL PROTEIN: 6.5 g/dL (ref 6.0–8.3)

## 2016-09-21 LAB — MICROALBUMIN / CREATININE URINE RATIO
Creatinine,U: 83.1 mg/dL
MICROALB/CREAT RATIO: 6.6 mg/g (ref 0.0–30.0)
Microalb, Ur: 5.5 mg/dL — ABNORMAL HIGH (ref 0.0–1.9)

## 2016-09-21 LAB — HEMOGLOBIN A1C: Hgb A1c MFr Bld: 6.7 % — ABNORMAL HIGH (ref 4.6–6.5)

## 2016-09-27 ENCOUNTER — Encounter: Payer: Self-pay | Admitting: Endocrinology

## 2016-09-27 ENCOUNTER — Ambulatory Visit (INDEPENDENT_AMBULATORY_CARE_PROVIDER_SITE_OTHER): Payer: Medicare HMO | Admitting: Endocrinology

## 2016-09-27 VITALS — BP 142/76 | HR 79 | Ht 72.0 in | Wt 197.2 lb

## 2016-09-27 DIAGNOSIS — E782 Mixed hyperlipidemia: Secondary | ICD-10-CM | POA: Diagnosis not present

## 2016-09-27 DIAGNOSIS — Z794 Long term (current) use of insulin: Secondary | ICD-10-CM | POA: Diagnosis not present

## 2016-09-27 DIAGNOSIS — I10 Essential (primary) hypertension: Secondary | ICD-10-CM | POA: Diagnosis not present

## 2016-09-27 DIAGNOSIS — E1165 Type 2 diabetes mellitus with hyperglycemia: Secondary | ICD-10-CM | POA: Diagnosis not present

## 2016-09-27 MED ORDER — ATORVASTATIN CALCIUM 10 MG PO TABS: 10.0000 mg | ORAL_TABLET | Freq: Every day | ORAL | 1 refills | Status: DC

## 2016-09-27 NOTE — Patient Instructions (Addendum)
Check blood sugars on waking up  3/7 days  Also check blood sugars about 2 hours after a meal and do this after different meals by rotation  Recommended blood sugar levels on waking up is 90-130 and about 2 hours after meal is 130-160  Please bring your blood sugar monitor to each visit, thank you  N INSULIN: TAKE PM DOSE OF 10 UNITS AT BEDTIME

## 2016-09-27 NOTE — Progress Notes (Signed)
Patient ID: Sandra Washington, female   DOB: 1940-09-13, 76 y.o.   MRN: [id number]   Reason for Appointment: Diabetes follow-up   History of Present Illness   Diagnosis: Type 2 DIABETES MELITUS, date of diagnosis:   1995       She has been on insulin almost since her diagnosis and has had various regimens over the years More recently has been on basal bolus insulin with Lantus and Humalog with variable control, depending on her compliance with diet and exercise She has generally been having difficulty with consistent compliance in the last few months because of taking care of her husband  Recent history:  Insulin regimen:  N insulin 8--12,  Novolin R 10-05-14 ac tid   Her A1c is better at 6.7, has been as high as 79.8  Current blood sugar patterns, management and problems:  Previously has been repeatedly told to take NPH in the evening at bedtime but she is taking this at suppertime again  Also she did not reduce the dose by 2 units as discussed on her last visit  Home blood sugars: They are being checked mostly before and after breakfast and only occasionally early afternoon with no readings later in the day  She is taking her highest dose of REGULAR INSULIN at suppertime but not checking readings afterwards  She does not appear to have consistent symptoms of hypoglycemia and some mornings even with blood sugar of 55 she was not symptomatic  She is trying to get back into her exercise regimen at the water aerobics class and this may be helping  However has not lost any more weight  She did have hypoglycemia twice on 7/6 when she was eating less with a viral illness but did not reduce her insulin doses days  Oral hypoglycemic drugs:  metformin  1g bid     Side effects from medications: None           Proper timing of medications in relation to meals: Yes.         Monitors blood glucose: Once a day or less .    Glucometer:  Accu-Chek  Blood Glucose readings:    Mean values apply above for all meters except median for One Touch  PRE-MEAL Fasting Lunch Dinner Bedtime Overall  Glucose range:  55-1 24  154    48-1 57   Mean/median:     94   POST-MEAL PC Breakfast PC Lunch PC Dinner  Glucose range: 76-139 147, 157    Mean/median:             Meals: 3 meals per day.  breakfast is: Cereal or egg; eating out usually having sandwiches. Lunch  12:30pm       Physical activity: exercise:   doing water aerobics 3/7       Dietician visit: Most recent: 03/2008           Wt Readings from Last 3 Encounters:  09/27/16 197 lb 3.2 oz (89.4 kg)  06/07/16 195 lb (88.5 kg)  03/30/16 199 lb (90.3 kg)    Lab Results  Component Value Date   HGBA1C 6.7 (H) 09/21/2016   HGBA1C 8.0 (H) 05/31/2016   HGBA1C 9.8 01/14/2016   Lab Results  Component Value Date   MICROALBUR 5.5 (H) 09/21/2016   LDLCALC 98 01/11/2016   CREATININE 0.69 09/21/2016    OTHER problems discussed today: See review of systems   Lab on 09/21/2016  Component Date Value Ref Range Status  .  Hgb A1c MFr Bld 09/21/2016 6.7* 4.6 - 6.5 % Final   Glycemic Control Guidelines for People with Diabetes:Non Diabetic:  <6%Goal of Therapy: <7%Additional Action Suggested:  >8%   . Sodium 09/21/2016 141  135 - 145 mEq/L Final  . Potassium 09/21/2016 4.4  3.5 - 5.1 mEq/L Final  . Chloride 09/21/2016 110  96 - 112 mEq/L Final  . CO2 09/21/2016 27  19 - 32 mEq/L Final  . Glucose, Bld 09/21/2016 95  70 - 99 mg/dL Final  . BUN 09/21/2016 15  6 - 23 mg/dL Final  . Creatinine, Ser 09/21/2016 0.69  0.40 - 1.20 mg/dL Final  . Total Bilirubin 09/21/2016 0.4  0.2 - 1.2 mg/dL Final  . Alkaline Phosphatase 09/21/2016 47  39 - 117 U/L Final  . AST 09/21/2016 14  0 - 37 U/L Final  . ALT 09/21/2016 11  0 - 35 U/L Final  . Total Protein 09/21/2016 6.5  6.0 - 8.3 g/dL Final  . Albumin 09/21/2016 3.9  3.5 - 5.2 g/dL Final  . Calcium 09/21/2016 9.8  8.4 - 10.5 mg/dL Final  . GFR 09/21/2016 105.91  >60.00  mL/min Final  . Microalb, Ur 09/21/2016 5.5* 0.0 - 1.9 mg/dL Final  . Creatinine,U 09/21/2016 83.1  mg/dL Final  . Microalb Creat Ratio 09/21/2016 6.6  0.0 - 30.0 mg/g Final    Allergies as of 09/27/2016   No Known Allergies     Medication List       Accurate as of 09/27/16 10:10 AM. Always use your most recent med list.          ACCU-CHEK AVIVA PLUS w/Device Kit Use to check blood sugar 3-4 times per day.   ACCU-CHEK SOFTCLIX LANCETS lancets   ALPRAZolam 0.5 MG tablet Commonly known as:  XANAX Take 0.5 mg by mouth daily as needed for anxiety.   amLODipine 5 MG tablet Commonly known as:  NORVASC   aspirin EC 81 MG tablet Take 81 mg by mouth daily.   atorvastatin 10 MG tablet Commonly known as:  LIPITOR TAKE ONE TABLET BY MOUTH ONCE DAILY   bisacodyl 5 MG EC tablet Commonly known as:  DULCOLAX Take 5 mg by mouth daily.   cetirizine 10 MG tablet Commonly known as:  ZYRTEC Take 10 mg by mouth daily as needed for allergies.   cyclobenzaprine 5 MG tablet Commonly known as:  FLEXERIL   glucose blood test strip Commonly known as:  ACCU-CHEK AVIVA PLUS Use to test blood sugar 4 times daily- Dx code E11.65   HYDROcodone-acetaminophen 10-325 MG tablet Commonly known as:  NORCO Take 1 tablet by mouth every 6 (six) hours as needed for pain.   insulin NPH Human 100 UNIT/ML injection Commonly known as:  NOVOLIN N RELION INJECT 12 UNITS SUBCUTANEOUSLY IN THE EVENING AND INJECT 8 UNITS IN THE MORNING   insulin regular 100 units/mL injection Commonly known as:  NOVOLIN R RELION 8 units with breakfast and lunch 16 units with dinner.   losartan-hydrochlorothiazide 100-25 MG tablet Commonly known as:  HYZAAR Take 1 tablet by mouth at bedtime.   metFORMIN 1000 MG tablet Commonly known as:  GLUCOPHAGE Take 1 tablet in the morning and 1 tablets in the evening   niacin 500 MG tablet Commonly known as:  SLO-NIACIN Take 500 mg by mouth at bedtime.   Vitamin D3 5000 units  Caps Take 5,000 Units by mouth.       Allergies: No Known Allergies  Past Medical History:  Diagnosis  Date  . Chronic kidney disease    kidney stone  . Diabetes mellitus without complication ([place])   . Hypertension   . Stroke [facility])     Past Surgical History:  Procedure Laterality Date  . HAND SURGERY    . NO PAST SURGERIES      No family history on file.  Social History:  reports that she has quit smoking. She has never used smokeless tobacco. She reports that she does not drink alcohol or use drugs.  Review of Systems:  She is asking about swelling her legs although this is more in the evening  HYPERTENSION:  she is supposed to be on  Losartan HCTZ and amlodipine, she apparently is not taking losartan HCT and not clear why Has not followed up with PCP.   HYPERLIPIDEMIA:  Well controlled previously with Lipitor 10 mg and niacin 500 mg, Niacin has been prescribed by her PCP    Lab Results  Component Value Date   CHOL 166 01/11/2016   HDL 57.80 01/11/2016   LDLCALC 98 01/11/2016   TRIG 52.0 01/11/2016   CHOLHDL 3 01/11/2016   Last diabetic foot exam in 8/18      Examination:   BP (!) 142/76   Pulse 79   Ht 6' (1.829 m)   Wt 197 lb 3.2 oz (89.4 kg)   SpO2 99%   BMI 26.75 kg/m   Body mass index is 26.75 kg/m.   Diabetic Foot Exam - Simple   Simple Foot Form Visual Inspection No deformities, no ulcerations, no other skin breakdown bilaterally:  Yes Sensation Testing Intact to touch and monofilament testing bilaterally:  Yes Pulse Check See comments:  Yes Comments Absent on left      ASSESSMENT/ PLAN:   Diabetes type 2  See history of present illness for detailed discussion of  current management, blood sugar patterns and problems identified  Currently Her A1c is improving and down to 6.7 which is the best she has had in a while She is on basal bolus insulin and metformin  Discussed her day-to-day blood sugar patterns and management in  detail Discussed that she is not getting her evening NPH at the appropriate time Also may be getting some hypoglycemia unawareness Blood sugars are low normal in the mornings and averaging only 79 and discussed possibility of overnight hypoglycemia that she is not aware of Discussed actions of basal and bolus insulins and peak times of either one She is still not wanting to afford [place]  She was told to take her evening NPH at bedtime and reduce the dose to 10 units for now She will try to adjust her suppertime dose based on how her blood sugars are after evening meal Needs to have balanced meals with some protein at each meal  HYPERTENSION: Blood pressure is high normal and she can discuss the mild edema she has with PCP, not clear why she is not taking her losartan HCT now  LIPIDS: Will recheck on the next visit, Lipitor refilled  Patient Instructions  Check blood sugars on waking up  3/7 days  Also check blood sugars about 2 hours after a meal and do this after different meals by rotation  Recommended blood sugar levels on waking up is 90-130 and about 2 hours after meal is 130-160  Please bring your blood sugar monitor to each visit, thank you  N INSULIN: TAKE PM DOSE OF 10 UNITS AT BEDTIME    Counseling time on subjects discussed above is  over 50% of today's 25 minute visit  Sandra Washington 09/27/2016, 10:10 AM   Note: This office note was prepared with Estate agent. Any transcriptional errors that result from this process are unintentional.

## 2016-09-28 ENCOUNTER — Telehealth: Payer: Self-pay

## 2016-09-28 MED ORDER — ATORVASTATIN CALCIUM 10 MG PO TABS: 10.0000 mg | ORAL_TABLET | Freq: Every day | ORAL | 1 refills | Status: DC

## 2016-09-28 NOTE — Telephone Encounter (Signed)
Patient called to advise that her atorvastatin (LIPITOR) 10 MG tablet was sent to the wrong pharmacy. Almyra Free fixed this and the medication is now at  [facility], [place] - [address] (Phone) [phone] (Fax)   No further action required

## 2016-12-19 ENCOUNTER — Other Ambulatory Visit (HOSPITAL_COMMUNITY): Payer: Self-pay | Admitting: Family Medicine

## 2016-12-19 DIAGNOSIS — Z1231 Encounter for screening mammogram for malignant neoplasm of breast: Secondary | ICD-10-CM

## 2016-12-25 ENCOUNTER — Other Ambulatory Visit: Payer: Medicare HMO

## 2016-12-28 ENCOUNTER — Ambulatory Visit: Payer: Medicare HMO | Admitting: Endocrinology

## 2016-12-28 DIAGNOSIS — Z23 Encounter for immunization: Secondary | ICD-10-CM | POA: Diagnosis not present

## 2017-01-02 ENCOUNTER — Other Ambulatory Visit: Payer: Self-pay

## 2017-01-02 ENCOUNTER — Other Ambulatory Visit: Payer: Self-pay | Admitting: Endocrinology

## 2017-01-02 MED ORDER — INSULIN REGULAR HUMAN 100 UNIT/ML IJ SOLN: INTRAMUSCULAR | 5 refills | Status: DC

## 2017-01-17 ENCOUNTER — Ambulatory Visit (HOSPITAL_COMMUNITY)
Admission: RE | Admit: 2017-01-17 | Discharge: 2017-01-17 | Disposition: A | Discharge status: Home / Self Care | Payer: Medicare HMO | Source: Ambulatory Visit | Attending: Family Medicine | Admitting: Family Medicine

## 2017-01-17 ENCOUNTER — Encounter (HOSPITAL_COMMUNITY): Payer: Self-pay

## 2017-01-17 DIAGNOSIS — Z1231 Encounter for screening mammogram for malignant neoplasm of breast: Secondary | ICD-10-CM

## 2017-02-12 DIAGNOSIS — N200 Calculus of kidney: Secondary | ICD-10-CM | POA: Diagnosis not present

## 2017-02-12 DIAGNOSIS — E782 Mixed hyperlipidemia: Secondary | ICD-10-CM | POA: Diagnosis not present

## 2017-02-12 DIAGNOSIS — E089 Diabetes mellitus due to underlying condition without complications: Secondary | ICD-10-CM | POA: Diagnosis not present

## 2017-02-12 DIAGNOSIS — I10 Essential (primary) hypertension: Secondary | ICD-10-CM | POA: Diagnosis not present

## 2017-02-14 DIAGNOSIS — E119 Type 2 diabetes mellitus without complications: Secondary | ICD-10-CM | POA: Diagnosis not present

## 2017-02-14 DIAGNOSIS — E11319 Type 2 diabetes mellitus with unspecified diabetic retinopathy without macular edema: Secondary | ICD-10-CM | POA: Diagnosis not present

## 2017-04-26 ENCOUNTER — Other Ambulatory Visit (INDEPENDENT_AMBULATORY_CARE_PROVIDER_SITE_OTHER): Payer: Medicare HMO

## 2017-04-26 DIAGNOSIS — E1165 Type 2 diabetes mellitus with hyperglycemia: Secondary | ICD-10-CM | POA: Diagnosis not present

## 2017-04-26 DIAGNOSIS — Z794 Long term (current) use of insulin: Secondary | ICD-10-CM | POA: Diagnosis not present

## 2017-04-26 LAB — COMPREHENSIVE METABOLIC PANEL
ALBUMIN: 3.9 g/dL (ref 3.5–5.2)
ALT: 10 U/L (ref 0–35)
AST: 14 U/L (ref 0–37)
Alkaline Phosphatase: 61 U/L (ref 39–117)
BUN: 16 mg/dL (ref 6–23)
CALCIUM: 10 mg/dL (ref 8.4–10.5)
CHLORIDE: 106 meq/L (ref 96–112)
CO2: 29 meq/L (ref 19–32)
CREATININE: 0.69 mg/dL (ref 0.40–1.20)
GFR: 105.74 mL/min (ref >60.00)
Glucose, Bld: 93 mg/dL (ref 70–99)
POTASSIUM: 4.4 meq/L (ref 3.5–5.1)
Sodium: 142 mEq/L (ref 135–145)
Total Bilirubin: 0.5 mg/dL (ref 0.2–1.2)
Total Protein: 6.8 g/dL (ref 6.0–8.3)

## 2017-04-26 LAB — LIPID PANEL
CHOLESTEROL: 164 mg/dL (ref 0–200)
HDL: 54.1 mg/dL (ref >39.00)
LDL Cholesterol: 99 mg/dL (ref 0–99)
NonHDL: 110.12
TRIGLYCERIDES: 58 mg/dL (ref 0.0–149.0)
Total CHOL/HDL Ratio: 3
VLDL: 11.6 mg/dL (ref 0.0–40.0)

## 2017-04-26 LAB — HEMOGLOBIN A1C: HEMOGLOBIN A1C: 7.4 % — AB (ref 4.6–6.5)

## 2017-05-01 ENCOUNTER — Ambulatory Visit: Payer: Medicare HMO | Admitting: Endocrinology

## 2017-05-01 ENCOUNTER — Encounter: Payer: Self-pay | Admitting: Endocrinology

## 2017-05-01 VITALS — BP 148/78 | HR 80 | Ht 72.0 in | Wt 193.2 lb

## 2017-05-01 DIAGNOSIS — Z794 Long term (current) use of insulin: Secondary | ICD-10-CM

## 2017-05-01 DIAGNOSIS — E1165 Type 2 diabetes mellitus with hyperglycemia: Secondary | ICD-10-CM | POA: Diagnosis not present

## 2017-05-01 MED ORDER — GLUCOSE BLOOD VI STRP: 1.0000 | ORAL_STRIP | Freq: Two times a day (BID) | 12 refills | Status: DC

## 2017-05-01 MED ORDER — ACCU-CHEK SOFTCLIX LANCETS MISC: 3 refills | Status: AC

## 2017-05-01 NOTE — Progress Notes (Signed)
Patient ID: Sandra Washington, female   DOB: [birth year], 77 y.o.   MRN: [id number]   Reason for Appointment: Diabetes follow-up   History of Present Illness   Diagnosis: Type 2 DIABETES MELITUS, date of diagnosis:   1995       She has been on insulin almost since her diagnosis and has had various regimens over the years More recently has been on basal bolus insulin with Lantus and Humalog with variable control, depending on her compliance with diet and exercise She has generally been having difficulty with consistent compliance in the last few months because of taking care of her husband  Recent history:  Insulin regimen:  N insulin 8--10,  Novolin R 10-05-14 ac tid   Her A1c is higher at 7.4, previously was better at 6.7, has been as high as 9.8  Current blood sugar patterns, management and problems:  [place] and she thinks because of her poor diet sugars may be higher  However since she did not have enough strips she is checking blood sugars sporadically and mostly in the morning only  Despite reducing NPH 2 units on the last visit at bedtime she still has had a couple of readings in the 60s fasting  FASTING blood sugars the last 4 days or so have been not as low  She has however lost weight  Highest blood sugar was after lunch yesterday when she had more sweets and other rich foods, glucose was 228  She has had another high reading of 180 after lunch but no other postprandial readings in the afternoon  Bedtime reading 113 only once  He has continued to do water exercises recently  Oral hypoglycemic drugs:  metformin  1g bid     Side effects from medications: None           Proper timing of medications in relation to meals: Yes.         Monitors blood glucose: Once a day or less .    Glucometer:  Accu-Chek  Blood Glucose readings:   Mean values apply above for all meters except median for One Touch  PRE-MEAL  Fasting Lunch Dinner Bedtime Overall  Glucose range:  65-1 68   58  113   Mean/median:     108   POST-MEAL PC Breakfast PC Lunch PC Dinner  Glucose range:   180, 228   Mean/median:             Meals: 3 meals per day.  breakfast is: Cereal or egg; eating out usually having sandwiches. Lunch  12:30pm        Physical activity: exercise:   doing water aerobics 3/7 days       Dietician visit: Most recent: 03/2008           Wt Readings from Last 3 Encounters:  05/01/17 193 lb 3.2 oz (87.6 kg)  09/27/16 197 lb 3.2 oz (89.4 kg)  06/07/16 195 lb (88.5 kg)    Lab Results  Component Value Date   HGBA1C 7.4 (H) 04/26/2017   HGBA1C 6.7 (H) 09/21/2016   HGBA1C 8.0 (H) 05/31/2016   Lab Results  Component Value Date   MICROALBUR 5.5 (H) 09/21/2016   LDLCALC 99 04/26/2017   CREATININE 0.69 04/26/2017    OTHER problems discussed today: See review of systems   Lab on 04/26/2017  Component Date Value Ref Range Status  . Cholesterol 04/26/2017 164  0 - 200 mg/dL Final   ATP III Classification       Desirable:  < 200 mg/dL               Borderline High:  200 - 239 mg/dL          High:  > = 240 mg/dL  . Triglycerides 04/26/2017 58.0  0.0 - 149.0 mg/dL Final   Normal:  <150 mg/dLBorderline High:  150 - 199 mg/dL  . HDL 04/26/2017 54.10  >39.00 mg/dL Final  . VLDL 04/26/2017 11.6  0.0 - 40.0 mg/dL Final  . LDL Cholesterol 04/26/2017 99  0 - 99 mg/dL Final  . Total CHOL/HDL Ratio 04/26/2017 3   Final                  Men          Women1/2 Average Risk     3.4          3.3Average Risk          5.0          4.42X Average Risk          9.6          7.13X Average Risk          15.0          11.0                      . NonHDL 04/26/2017 110.12   Final   NOTE:  Non-HDL goal should be 30 mg/dL higher than patient's LDL goal (i.e. LDL goal of < 70 mg/dL, would have non-HDL goal of < 100 mg/dL)  . Sodium 04/26/2017 142  135 - 145 mEq/L Final  . Potassium 04/26/2017 4.4  3.5 - 5.1 mEq/L Final  .  Chloride 04/26/2017 106  96 - 112 mEq/L Final  . CO2 04/26/2017 29  19 - 32 mEq/L Final  . Glucose, Bld 04/26/2017 93  70 - 99 mg/dL Final  . BUN 04/26/2017 16  6 - 23 mg/dL Final  . Creatinine, Ser 04/26/2017 0.69  0.40 - 1.20 mg/dL Final  . Total Bilirubin 04/26/2017 0.5  0.2 - 1.2 mg/dL Final  . Alkaline Phosphatase 04/26/2017 61  39 - 117 U/L Final  . AST 04/26/2017 14  0 - 37 U/L Final  . ALT 04/26/2017 10  0 - 35 U/L Final  . Total Protein 04/26/2017 6.8  6.0 - 8.3 g/dL Final  . Albumin 04/26/2017 3.9  3.5 - 5.2 g/dL Final  . Calcium 04/26/2017 10.0  8.4 - 10.5 mg/dL Final  . GFR 04/26/2017 105.74  >60.00 mL/min Final  . Hgb A1c MFr Bld 04/26/2017 7.4* 4.6 - 6.5 % Final   Glycemic Control Guidelines for People with Diabetes:Non Diabetic:  <6%Goal of Therapy: <7%Additional Action Suggested:  >8%     Allergies as of 05/01/2017   No Known Allergies     Medication List        Accurate as of 05/01/17  1:01 PM. Always use your most recent med list.          ACCU-CHEK AVIVA PLUS w/Device Kit Use to check blood sugar 3-4 times per day.   ACCU-CHEK SOFTCLIX LANCETS lancets Use bid   ALPRAZolam 0.5 MG tablet Commonly known as:  XANAX Take 0.5 mg by mouth daily as needed for anxiety.   amLODipine 5 MG tablet Commonly known as:  NORVASC   aspirin EC 81 MG  tablet Take 81 mg by mouth daily.   atorvastatin 10 MG tablet Commonly known as:  LIPITOR Take 1 tablet (10 mg total) by mouth daily.   bisacodyl 5 MG EC tablet Commonly known as:  DULCOLAX Take 5 mg by mouth daily.   cetirizine 10 MG tablet Commonly known as:  ZYRTEC Take 10 mg by mouth daily as needed for allergies.   cyclobenzaprine 5 MG tablet Commonly known as:  FLEXERIL   glucose blood test strip Commonly known as:  ACCU-CHEK GUIDE 1 each by Other route 2 (two) times daily. Use as instructed   HYDROcodone-acetaminophen 10-325 MG tablet Commonly known as:  NORCO Take 1 tablet by mouth every 6 (six) hours  as needed for pain.   insulin regular 100 units/mL injection Commonly known as:  NOVOLIN R RELION 8 units with breakfast and lunch 16 units with dinner.   losartan-hydrochlorothiazide 100-25 MG tablet Commonly known as:  HYZAAR Take 1 tablet by mouth at bedtime.   metFORMIN 1000 MG tablet Commonly known as:  GLUCOPHAGE Take 1 tablet in the morning and 1 tablets in the evening   niacin 500 MG tablet Commonly known as:  SLO-NIACIN Take 500 mg by mouth at bedtime.   NOVOLIN N RELION 100 UNIT/ML injection Generic drug:  insulin NPH Human INJECT 8 UNITS SUBCUTANEOUSLY IN THE MORNING AND 12 UNITS IN THE EVENING   Vitamin D3 5000 units Caps Take 5,000 Units by mouth.       Allergies: No Known Allergies  Past Medical History:  Diagnosis Date  . Chronic kidney disease    kidney stone  . Diabetes mellitus without complication ([place])   . Hypertension   . Stroke [facility])     Past Surgical History:  Procedure Laterality Date  . BREAST BIOPSY Left   . HAND SURGERY    . NO PAST SURGERIES      No family history on file.  Social History:  reports that she has quit smoking. she has never used smokeless tobacco. She reports that she does not drink alcohol or use drugs.  Review of Systems:   HYPERTENSION:  she is supposed to be on  Losartan HCTZ and amlodipine   HYPERLIPIDEMIA:  Well controlled previously with Lipitor 10 mg and niacin 500 mg, Niacin has been prescribed by her PCP    Lab Results  Component Value Date   CHOL 164 04/26/2017   HDL 54.10 04/26/2017   LDLCALC 99 04/26/2017   TRIG 58.0 04/26/2017   CHOLHDL 3 04/26/2017   Last diabetic foot exam in 8/18      Examination:   BP (!) 148/78 (BP Location: Left Arm, Patient Position: Sitting, Cuff Size: Normal)   Pulse 80   Ht 6' (1.829 m)   Wt 193 lb 3.2 oz (87.6 kg)   SpO2 98%   BMI 26.20 kg/m   Body mass index is 26.2 kg/m.      ASSESSMENT/ PLAN:   Diabetes type 2  See history of present illness  for detailed discussion of  current management, blood sugar patterns and problems identified  Her blood sugars are not as well-controlled as judged by her A1c of 7.4 This is likely to be from not watching her portions, carbohydrates and having balanced meals over the last 3 months Still has a tendency to high readings after lunch With minimal monitoring after meals difficult to assess her requirement for regular insulin Fasting readings are excellent although a couple of weeks ago she was having low normal  readings Weight is stable  Emphasized the need for more consistent glucose monitoring after meals She was given a new Accu-Chek guide meter today She will increase her lunchtime dose by 2 units and dinnertime dose to be reduced by 2 units for more even control However discussed that further adjustments may need to be based on how her postprandial readings come out  HYPERTENSION: Blood pressure is high normal and she can start taking blood pressure at home and follow-up with PCP, may need higher dose of amlodipine    LIPIDS: LDL is 99, to continue treatment  Patient Instructions  Check blood sugars on waking up  3-4/7 days  Also check blood sugars about 2 hours after a meal and do this after different meals by rotation  Recommended blood sugar levels on waking up is 90-130 and about 2 hours after meal is 130-160  Please bring your blood sugar monitor to each visit, thank you  Take 8--10--14   Must do sugars at 8-9pm      Elayne Snare 05/01/2017, 1:01 PM   Note: This office note was prepared with Dragon voice recognition system technology. Any transcriptional errors that result from this process are unintentional.

## 2017-05-01 NOTE — Patient Instructions (Addendum)
Check blood sugars on waking up  3-4/7 days  Also check blood sugars about 2 hours after a meal and do this after different meals by rotation  Recommended blood sugar levels on waking up is 90-130 and about 2 hours after meal is 130-160  Please bring your blood sugar monitor to each visit, thank you  Take 8--10--14   Must do sugars at 8-9pm

## 2017-07-04 ENCOUNTER — Other Ambulatory Visit: Payer: Self-pay | Admitting: Endocrinology

## 2017-08-07 ENCOUNTER — Other Ambulatory Visit: Payer: Medicare HMO

## 2017-08-09 ENCOUNTER — Ambulatory Visit: Payer: Medicare HMO | Admitting: Endocrinology

## 2017-09-28 DIAGNOSIS — H16223 Keratoconjunctivitis sicca, not specified as Sjogren's, bilateral: Secondary | ICD-10-CM | POA: Diagnosis not present

## 2017-09-28 DIAGNOSIS — H04123 Dry eye syndrome of bilateral lacrimal glands: Secondary | ICD-10-CM | POA: Diagnosis not present

## 2017-10-03 DIAGNOSIS — Z6829 Body mass index (BMI) 29.0-29.9, adult: Secondary | ICD-10-CM | POA: Diagnosis not present

## 2017-10-03 DIAGNOSIS — R4702 Dysphasia: Secondary | ICD-10-CM | POA: Diagnosis not present

## 2017-10-04 ENCOUNTER — Other Ambulatory Visit (HOSPITAL_COMMUNITY): Payer: Self-pay | Admitting: Family Medicine

## 2017-10-04 DIAGNOSIS — R1312 Dysphagia, oropharyngeal phase: Secondary | ICD-10-CM

## 2017-10-09 ENCOUNTER — Ambulatory Visit (HOSPITAL_COMMUNITY)
Admission: RE | Admit: 2017-10-09 | Discharge: 2017-10-09 | Disposition: A | Discharge status: Home / Self Care | Payer: Medicare HMO | Source: Ambulatory Visit | Attending: Family Medicine | Admitting: Family Medicine

## 2017-10-09 DIAGNOSIS — K222 Esophageal obstruction: Secondary | ICD-10-CM | POA: Diagnosis not present

## 2017-10-09 DIAGNOSIS — K449 Diaphragmatic hernia without obstruction or gangrene: Secondary | ICD-10-CM | POA: Insufficient documentation

## 2017-10-09 DIAGNOSIS — R1312 Dysphagia, oropharyngeal phase: Secondary | ICD-10-CM | POA: Diagnosis not present

## 2017-10-11 DIAGNOSIS — R1310 Dysphagia, unspecified: Secondary | ICD-10-CM | POA: Diagnosis not present

## 2017-10-11 DIAGNOSIS — K219 Gastro-esophageal reflux disease without esophagitis: Secondary | ICD-10-CM | POA: Diagnosis not present

## 2017-10-11 DIAGNOSIS — R634 Abnormal weight loss: Secondary | ICD-10-CM | POA: Diagnosis not present

## 2017-10-11 DIAGNOSIS — Z86010 Personal history of colonic polyps: Secondary | ICD-10-CM | POA: Diagnosis not present

## 2017-10-11 DIAGNOSIS — R933 Abnormal findings on diagnostic imaging of other parts of digestive tract: Secondary | ICD-10-CM | POA: Diagnosis not present

## 2017-10-15 DIAGNOSIS — K222 Esophageal obstruction: Secondary | ICD-10-CM | POA: Diagnosis not present

## 2017-10-15 DIAGNOSIS — R933 Abnormal findings on diagnostic imaging of other parts of digestive tract: Secondary | ICD-10-CM | POA: Diagnosis not present

## 2017-10-15 DIAGNOSIS — R1310 Dysphagia, unspecified: Secondary | ICD-10-CM | POA: Diagnosis not present

## 2017-11-26 ENCOUNTER — Other Ambulatory Visit: Payer: Self-pay | Admitting: Endocrinology

## 2017-11-29 ENCOUNTER — Other Ambulatory Visit: Payer: Self-pay | Admitting: Endocrinology

## 2017-12-17 ENCOUNTER — Other Ambulatory Visit: Payer: Self-pay | Admitting: Endocrinology

## 2017-12-17 NOTE — Telephone Encounter (Signed)
Pt need to make an follow up appt for more refills

## 2017-12-20 ENCOUNTER — Other Ambulatory Visit (HOSPITAL_COMMUNITY): Payer: Self-pay | Admitting: Family Medicine

## 2017-12-20 DIAGNOSIS — Z1231 Encounter for screening mammogram for malignant neoplasm of breast: Secondary | ICD-10-CM

## 2018-01-15 DIAGNOSIS — Z6830 Body mass index (BMI) 30.0-30.9, adult: Secondary | ICD-10-CM | POA: Diagnosis not present

## 2018-01-15 DIAGNOSIS — K649 Unspecified hemorrhoids: Secondary | ICD-10-CM | POA: Diagnosis not present

## 2018-01-15 DIAGNOSIS — I10 Essential (primary) hypertension: Secondary | ICD-10-CM | POA: Diagnosis not present

## 2018-01-21 ENCOUNTER — Ambulatory Visit (HOSPITAL_COMMUNITY)
Admission: RE | Admit: 2018-01-21 | Discharge: 2018-01-21 | Disposition: A | Discharge status: Home / Self Care | Payer: Medicare HMO | Source: Ambulatory Visit | Attending: Family Medicine | Admitting: Family Medicine

## 2018-01-21 ENCOUNTER — Encounter (HOSPITAL_COMMUNITY): Payer: Self-pay

## 2018-01-21 DIAGNOSIS — Z1231 Encounter for screening mammogram for malignant neoplasm of breast: Secondary | ICD-10-CM | POA: Diagnosis not present

## 2018-01-28 DIAGNOSIS — H16223 Keratoconjunctivitis sicca, not specified as Sjogren's, bilateral: Secondary | ICD-10-CM | POA: Diagnosis not present

## 2018-01-28 DIAGNOSIS — H5200 Hypermetropia, unspecified eye: Secondary | ICD-10-CM | POA: Diagnosis not present

## 2018-01-28 DIAGNOSIS — H04123 Dry eye syndrome of bilateral lacrimal glands: Secondary | ICD-10-CM | POA: Diagnosis not present

## 2018-02-01 ENCOUNTER — Other Ambulatory Visit (INDEPENDENT_AMBULATORY_CARE_PROVIDER_SITE_OTHER): Payer: Medicare HMO

## 2018-02-01 DIAGNOSIS — Z794 Long term (current) use of insulin: Secondary | ICD-10-CM

## 2018-02-01 DIAGNOSIS — E1165 Type 2 diabetes mellitus with hyperglycemia: Secondary | ICD-10-CM

## 2018-02-01 LAB — BASIC METABOLIC PANEL
BUN: 10 mg/dL (ref 6–23)
CALCIUM: 9.8 mg/dL (ref 8.4–10.5)
CHLORIDE: 106 meq/L (ref 96–112)
CO2: 28 mEq/L (ref 19–32)
CREATININE: 0.68 mg/dL (ref 0.40–1.20)
GFR: 107.33 mL/min (ref >60.00)
Glucose, Bld: 121 mg/dL — ABNORMAL HIGH (ref 70–99)
Potassium: 4.5 mEq/L (ref 3.5–5.1)
Sodium: 141 mEq/L (ref 135–145)

## 2018-02-01 LAB — HEMOGLOBIN A1C: HEMOGLOBIN A1C: 7 % — AB (ref 4.6–6.5)

## 2018-02-01 LAB — MICROALBUMIN / CREATININE URINE RATIO
CREATININE, U: 58.5 mg/dL
Microalb Creat Ratio: 1.2 mg/g (ref 0.0–30.0)
Microalb, Ur: <0.7 mg/dL (ref 0.0–1.9)

## 2018-02-04 NOTE — Progress Notes (Signed)
Patient ID: Sandra Washington, female   DOB: 18-Sep-1940, 77 y.o.   MRN: [id number]   Reason for Appointment: Diabetes follow-up   History of Present Illness   Diagnosis: Type 2 DIABETES MELITUS, date of diagnosis:   1995       She has been on insulin almost since her diagnosis and has had various regimens over the years More recently has been on basal bolus insulin with Lantus and Humalog with variable control, depending on her compliance with diet and exercise She has generally been having difficulty with consistent compliance in the last few months because of taking care of her husband  Recent history:  Insulin regimen:  N insulin 8 units a.m.--12 at bedtime,  Novolin R 10-05-14 ac tid   Her A1c is variable and now 7 compared to 7.4 previously  She has not been seen in follow-up since 3/19   Current blood sugar patterns, management and problems:  As before she is checking her blood sugars somewhat sporadically and mostly in the mornings  Overall blood sugars are again lower than expected for her A1c  Now she said that she is only checking her sugars after breakfast in the morning and not when she wakes up  Blood sugars are usually not high except for one reading last Saturday afternoon  Also sporadically has HYPOGLYCEMIA with low readings on 01/16/2018  Recently fasting readings are fairly good  She usually is not paying attention to instructions for insulin changes and continues to take the same insulin as before  Recently not active and does not like to do water aerobics during winter months  Oral hypoglycemic drugs:  metformin  1g bid     Side effects from medications: None           Proper timing of medications in relation to meals: Yes.         Monitors blood glucose: Once a day or less .    Glucometer:  Accu-Chek  Blood Glucose readings:    PRE-MEAL Fasting Lunch Dinner Bedtime Overall  Glucose range: ?      Mean/median:     103   POST-MEAL PC  Breakfast PC Lunch PC Dinner  Glucose range:  64-113  57-173  52, 111  Mean/median:        Mean values apply above for all meters except median for One Touch  PRE-MEAL Fasting Lunch Dinner Bedtime Overall  Glucose range:  65-1 68   58  113   Mean/median:     108   POST-MEAL PC Breakfast PC Lunch PC Dinner  Glucose range:   180, 228   Mean/median:             Meals: 3 meals per day.  breakfast is: Cereal or egg; eating out usually having sandwiches. Lunch  12:30pm        Physical activity: exercise:  was doing water aerobics 3/7 days       Dietician visit: Most recent: 03/2008           Wt Readings from Last 3 Encounters:  02/05/18 194 lb 9.6 oz (88.3 kg)  05/01/17 193 lb 3.2 oz (87.6 kg)  09/27/16 197 lb 3.2 oz (89.4 kg)    Lab Results  Component Value Date   HGBA1C 7.0 (H) 02/01/2018   HGBA1C 7.4 (H) 04/26/2017   HGBA1C 6.7 (H) 09/21/2016   Lab Results  Component Value Date   MICROALBUR <0.7 02/01/2018   [place] 99 04/26/2017  CREATININE 0.68 02/01/2018    OTHER problems discussed today: See review of systems   Lab on 02/01/2018  Component Date Value Ref Range Status  . Microalb, Ur 02/01/2018 <0.7  0.0 - 1.9 mg/dL Final  . Creatinine,U 02/01/2018 58.5  mg/dL Final  . Microalb Creat Ratio 02/01/2018 1.2  0.0 - 30.0 mg/g Final  . Sodium 02/01/2018 141  135 - 145 mEq/L Final  . Potassium 02/01/2018 4.5  3.5 - 5.1 mEq/L Final  . Chloride 02/01/2018 106  96 - 112 mEq/L Final  . CO2 02/01/2018 28  19 - 32 mEq/L Final  . Glucose, Bld 02/01/2018 121* 70 - 99 mg/dL Final  . BUN 02/01/2018 10  6 - 23 mg/dL Final  . Creatinine, Ser 02/01/2018 0.68  0.40 - 1.20 mg/dL Final  . Calcium 02/01/2018 9.8  8.4 - 10.5 mg/dL Final  . GFR 02/01/2018 107.33  >60.00 mL/min Final  . Hgb A1c MFr Bld 02/01/2018 7.0* 4.6 - 6.5 % Final   Glycemic Control Guidelines for People with Diabetes:Non Diabetic:  <6%Goal of Therapy: <7%Additional Action Suggested:  >8%     Allergies as  of 02/05/2018   No Known Allergies     Medication List        Accurate as of 02/05/18  1:52 PM. Always use your most recent med list.          ACCU-CHEK AVIVA PLUS w/Device Kit Use to check blood sugar 3-4 times per day.   ACCU-CHEK SOFTCLIX LANCETS lancets Use bid   amLODipine 5 MG tablet Commonly known as:  NORVASC   aspirin EC 81 MG tablet Take 81 mg by mouth daily.   atorvastatin 10 MG tablet Commonly known as:  LIPITOR Take 1 tablet (10 mg total) by mouth daily.   bisacodyl 5 MG EC tablet Commonly known as:  DULCOLAX Take 5 mg by mouth daily.   cetirizine 10 MG tablet Commonly known as:  ZYRTEC Take 10 mg by mouth daily as needed for allergies.   glucose blood test strip 1 each by Other route 2 (two) times daily. Use as instructed   ACCU-CHEK AVIVA PLUS test strip Generic drug:  glucose blood USE ONE STRIP TO CHECK GLUCOSE 4 TIMES DAILY   insulin regular 100 units/mL injection Commonly known as:  NOVOLIN R,HUMULIN R 8 units with breakfast and lunch 16 units with dinner.   losartan-hydrochlorothiazide 100-25 MG tablet Commonly known as:  HYZAAR Take 1 tablet by mouth at bedtime.   metFORMIN 1000 MG tablet Commonly known as:  GLUCOPHAGE Take 1 tablet in the morning and 1 tablets in the evening   niacin 500 MG tablet Commonly known as:  SLO-NIACIN Take 500 mg by mouth at bedtime.   NOVOLIN N RELION 100 UNIT/ML injection Generic drug:  insulin NPH Human INJECT 8 UNITS SUBCUTANEOUSLY IN THE MORNING AND 12 UNITS IN THE EVENING   omeprazole 40 MG capsule Commonly known as:  PRILOSEC Take 40 mg by mouth daily.   Vitamin D3 125 MCG (5000 UT) Caps Take 5,000 Units by mouth.       Allergies: No Known Allergies  Past Medical History:  Diagnosis Date  . Chronic kidney disease    kidney stone  . Diabetes mellitus without complication ([place])   . Hypertension   . Stroke [facility])     Past Surgical History:  Procedure Laterality Date  . BREAST  BIOPSY Left   . HAND SURGERY    . NO PAST SURGERIES  History reviewed. No pertinent family history.  Social History:  reports that she has quit smoking. She has never used smokeless tobacco. She reports that she does not drink alcohol or use drugs.  Review of Systems:   HYPERTENSION:  she is on  Losartan HCTZ and amlodipine   HYPERLIPIDEMIA:  Well controlled with Lipitor 10 mg and niacin 500 mg, Niacin has been prescribed by her PCP    Lab Results  Component Value Date   CHOL 164 04/26/2017   HDL 54.10 04/26/2017   LDLCALC 99 04/26/2017   TRIG 58.0 04/26/2017   CHOLHDL 3 04/26/2017   Last diabetic foot exam in 12/19      Examination:   BP 140/70 (BP Location: Left Arm, Patient Position: Sitting, Cuff Size: Normal)   Pulse 65   Ht 6' (1.829 m)   Wt 194 lb 9.6 oz (88.3 kg)   SpO2 96%   BMI 26.39 kg/m   Body mass index is 26.39 kg/m.     Diabetic Foot Exam - Simple   Simple Foot Form Diabetic Foot exam was performed with the following findings:  Yes 02/05/2018  1:51 PM  Visual Inspection No deformities, no ulcerations, no other skin breakdown bilaterally:  Yes Sensation Testing Intact to touch and monofilament testing bilaterally:  Yes Pulse Check Posterior Tibialis and Dorsalis pulse intact bilaterally:  Yes See comments:  Yes Comments Absent PT pulses     ASSESSMENT/ PLAN:   Diabetes type 2  See history of present illness for detailed discussion of  current management, blood sugar patterns and problems identified  She is again having sporadic follow-up  A1c is 7% although home readings are generally lower and has only one high reading recently of 173  Although she has sporadic low sugars this is not recent Also not checking blood sugars after waking up As before she does not want to take brand-name insulin and will continue on NPH and regular insulin  She will check her sugars more consistently and call if consistently high or  low  HYPERTENSION: Well controlled    Patient Instructions  Check blood sugars on waking up 3 days a week  Also check blood sugars about 2 hours after meals and do this after different meals by rotation  Recommended blood sugar levels on waking up are 90-130 and about 2 hours after meal is 130-160  Please bring your blood sugar monitor to each visit, thank you  Call if sugar low      Elayne Snare 02/05/2018, 1:52 PM   Note: This office note was prepared with Dragon voice recognition system technology. Any transcriptional errors that result from this process are unintentional.

## 2018-02-05 ENCOUNTER — Encounter: Payer: Self-pay | Admitting: Endocrinology

## 2018-02-05 ENCOUNTER — Ambulatory Visit: Payer: Medicare HMO | Admitting: Endocrinology

## 2018-02-05 VITALS — BP 140/70 | HR 65 | Ht 72.0 in | Wt 194.6 lb

## 2018-02-05 DIAGNOSIS — E1165 Type 2 diabetes mellitus with hyperglycemia: Secondary | ICD-10-CM | POA: Diagnosis not present

## 2018-02-05 DIAGNOSIS — Z794 Long term (current) use of insulin: Secondary | ICD-10-CM

## 2018-02-05 NOTE — Patient Instructions (Addendum)
Check blood sugars on waking up 3 days a week  Also check blood sugars about 2 hours after meals and do this after different meals by rotation  Recommended blood sugar levels on waking up are 90-130 and about 2 hours after meal is 130-160  Please bring your blood sugar monitor to each visit, thank you  Call if sugar low

## 2018-02-06 ENCOUNTER — Other Ambulatory Visit: Payer: Self-pay | Admitting: Endocrinology

## 2018-02-14 DIAGNOSIS — I10 Essential (primary) hypertension: Secondary | ICD-10-CM | POA: Diagnosis not present

## 2018-02-14 DIAGNOSIS — E1100 Type 2 diabetes mellitus with hyperosmolarity without nonketotic hyperglycemic-hyperosmolar coma (NKHHC): Secondary | ICD-10-CM | POA: Diagnosis not present

## 2018-02-14 DIAGNOSIS — Z0000 Encounter for general adult medical examination without abnormal findings: Secondary | ICD-10-CM | POA: Diagnosis not present

## 2018-02-14 DIAGNOSIS — J682 Upper respiratory inflammation due to chemicals, gases, fumes and vapors, not elsewhere classified: Secondary | ICD-10-CM | POA: Diagnosis not present

## 2018-02-14 DIAGNOSIS — Z6830 Body mass index (BMI) 30.0-30.9, adult: Secondary | ICD-10-CM | POA: Diagnosis not present

## 2018-02-15 ENCOUNTER — Other Ambulatory Visit: Payer: Self-pay | Admitting: Endocrinology

## 2018-02-25 ENCOUNTER — Encounter (INDEPENDENT_AMBULATORY_CARE_PROVIDER_SITE_OTHER): Payer: Self-pay | Admitting: Ophthalmology

## 2018-02-25 ENCOUNTER — Ambulatory Visit (INDEPENDENT_AMBULATORY_CARE_PROVIDER_SITE_OTHER): Payer: Medicare HMO | Admitting: Ophthalmology

## 2018-02-25 DIAGNOSIS — E113393 Type 2 diabetes mellitus with moderate nonproliferative diabetic retinopathy without macular edema, bilateral: Secondary | ICD-10-CM

## 2018-02-25 DIAGNOSIS — Z961 Presence of intraocular lens: Secondary | ICD-10-CM

## 2018-02-25 DIAGNOSIS — H40039 Anatomical narrow angle, unspecified eye: Secondary | ICD-10-CM | POA: Diagnosis not present

## 2018-02-25 DIAGNOSIS — H3581 Retinal edema: Secondary | ICD-10-CM | POA: Diagnosis not present

## 2018-02-25 DIAGNOSIS — I10 Essential (primary) hypertension: Secondary | ICD-10-CM | POA: Diagnosis not present

## 2018-02-25 DIAGNOSIS — H35033 Hypertensive retinopathy, bilateral: Secondary | ICD-10-CM | POA: Diagnosis not present

## 2018-02-25 NOTE — Progress Notes (Signed)
[facility] Note  02/25/2018     CHIEF COMPLAINT Patient presents for Diabetic Eye Exam   HISTORY OF PRESENT ILLNESS: Sandra Washington is a 77 y.o. female who presents to the clinic today for:   HPI    Diabetic Eye Exam    Vision is stable.  Associated Symptoms Negative for Flashes, Blind Spot, Photophobia, Scalp Tenderness, Fever, Floaters, Pain, Glare, Jaw Claudication, Weight Loss, Distortion, Redness, Trauma, Shoulder/Hip pain and Fatigue.  Diabetes characteristics include Type 2.  This started years ago (40-50).  Blood sugar level is controlled.  Last Blood Glucose 60.  Last A1C 7.  I, the attending physician,  performed the HPI with the patient and updated documentation appropriately.          Comments    Patient states referred by Dr. Jorja Loa for diabetic retinal eval. Patient states blood sugars controlled. Last A1c was 7.0 November or December.        Last edited by Bernarda Caffey, MD on 02/27/2018 12:20 PM. (History)    pt states she was referred by Dr. Jorja Loa for a diabetic exam, pt states her current glasses rx is from 2018, she states she got a new rx this year but did not want to get it filled yet, pt states she was dx with diabetes when she was 32, pt states Dr. Talbert Forest did her cataract sx, pt states she is using AT's and was on PF for redness, pt states her blood sugar is pretty well controlled, she states she sees Dr. Dwyane Dee and last saw him on December 19  Referring physician: Madelin Headings, DO [address] [place], [place] [address]  HISTORICAL INFORMATION:   Selected notes from the MEDICAL RECORD NUMBER Referred by Dr. Madelin Headings for concern of diabetic retinopathy LEE: 12.02.19 (M. Cotter) [BCVA: OD: 20/50++ OS: 20/30++] Ocular Hx-hypermetropia, DES, keratocojunctivitis, pseudo OU, YAG OU, iridectomy PMH-DM (takes Humalog/Metformin), HLD, HTN    CURRENT MEDICATIONS: No current outpatient medications on file. (Ophthalmic  Drugs)   No current facility-administered medications for this visit.  (Ophthalmic Drugs)   Current Outpatient Medications (Other)  Medication Sig  . ACCU-CHEK AVIVA PLUS test strip USE 1 STRIP TO CHECK GLUCOSE 4 TIMES DAILY  . ACCU-CHEK SOFTCLIX LANCETS lancets Use bid  . amLODipine (NORVASC) 5 MG tablet   . aspirin EC 81 MG tablet Take 81 mg by mouth daily.  Marland Kitchen atorvastatin (LIPITOR) 10 MG tablet Take 1 tablet (10 mg total) by mouth daily.  . bisacodyl (DULCOLAX) 5 MG EC tablet Take 5 mg by mouth daily.  . Blood Glucose Monitoring Suppl (ACCU-CHEK AVIVA PLUS) w/Device KIT Use to check blood sugar 3-4 times per day.  . Cholecalciferol (VITAMIN D3) 5000 UNITS CAPS Take 5,000 Units by mouth.  Marland Kitchen glucose blood (ACCU-CHEK GUIDE) test strip 1 each by Other route 2 (two) times daily. Use as instructed  . losartan-hydrochlorothiazide (HYZAAR) 100-25 MG per tablet Take 1 tablet by mouth at bedtime.   . metFORMIN (GLUCOPHAGE) 1000 MG tablet Take 1 tablet in the morning and 1 tablets in the evening  . niacin (SLO-NIACIN) 500 MG tablet Take 500 mg by mouth at bedtime.  Marland Kitchen NOVOLIN N RELION 100 UNIT/ML injection INJECT 8 UNITS SUBCUTANEOUSLY IN THE MORNING AND 12 UNITS IN THE EVENING  . NOVOLIN R RELION 100 UNIT/ML injection  INJECT 8 UNITS SUBCUTANEOUSLY WITH BREAKFAST AND LUNCH, AND 16 UNITS WITH DINNER  . omeprazole (PRILOSEC) 40 MG capsule Take 40 mg by mouth  daily.  . cetirizine (ZYRTEC) 10 MG tablet Take 10 mg by mouth daily as needed for allergies.    No current facility-administered medications for this visit.  (Other)      REVIEW OF SYSTEMS: ROS    Positive for: Musculoskeletal, Endocrine, Eyes   Negative for: Constitutional, Gastrointestinal, Neurological, Skin, Genitourinary, HENT, Cardiovascular, Respiratory, Psychiatric, Allergic/Imm, Heme/Lymph   Last edited by Roselee Nova D on 02/25/2018  8:02 AM. (History)       ALLERGIES No Known Allergies  PAST MEDICAL HISTORY Past  Medical History:  Diagnosis Date  . Chronic kidney disease    kidney stone  . Diabetes mellitus without complication ([place])   . Hypertension   . Stroke [facility])    Past Surgical History:  Procedure Laterality Date  . BREAST BIOPSY Left   . CATARACT EXTRACTION Bilateral 2018   Dr. Tommy Rainwater  . HAND SURGERY    . NO PAST SURGERIES      FAMILY HISTORY History reviewed. No pertinent family history.  SOCIAL HISTORY Social History   Tobacco Use  . Smoking status: Former Research scientist (life sciences)  . Smokeless tobacco: Never Used  Substance Use Topics  . Alcohol use: No  . Drug use: No         OPHTHALMIC EXAM:  Base Eye Exam    Visual Acuity (Snellen - Linear)      Right Left   Dist cc 20/40 +1 20/30 +1   Dist ph cc 20/25 -2 20/25 -1   Correction:  Glasses       Tonometry (Tonopen, 8:18 AM)      Right Left   Pressure 14 13       Pupils      Dark Light Shape React APD   Right 4 3 Round Brisk None   Left 4 3 Round Brisk None       Visual Fields (Counting fingers)      Left Right    Full Full       Extraocular Movement      Right Left    Full, Ortho Full, Ortho       Neuro/Psych    Oriented x3:  Yes   Mood/Affect:  Normal       Dilation    Both eyes:  1.0% Mydriacyl, 2.5% Phenylephrine @ 8:18 AM        Slit Lamp and Fundus Exam    Slit Lamp Exam      Right Left   Lids/Lashes Dermatochalasis - upper lid Dermatochalasis - upper lid   Conjunctiva/Sclera nasal and temporal Pinguecula, Melanosis nasal and temporal Pinguecula, Melanosis   Cornea Arcus, 1-2+ Punctate epithelial erosions, Well healed temporal cataract wounds Arcus, 1+ Punctate epithelial erosions, Well healed temporal cataract wounds   Anterior Chamber moderate depth, Narrow angles moderate depth   Iris Round and moderately dilated, focal iris atrophy at 1100  --  PI not open Round and moderately dilated, small patent PI at 1000   Lens PC IOL in good position with open PC, mild elschnig pearls  superiorly/inferiorly PC IOL in good position with open PC, mild elschnig pearls superiorly/inferiorly   Vitreous Vitreous syneresis Vitreous syneresis       Fundus Exam      Right Left   Disc Pink and Sharp Pink and Sharp   C/D Ratio 0.2 0.3   Macula Flat, Blunted foveal reflex, few Microaneurysms, +Cystic changes Flat, Blunted foveal reflex, scattered Microaneurysms temporal to focea, trace cystic changes   Vessels mild Vascular attenuation  mild Vascular attenuation   Periphery Attached, scattered MA, scattered focal exudate, nasally  -- mild Attached, rare MA        Refraction    Wearing Rx      Sphere Cylinder Axis   Right -1.75 +1.00 180   Left -1.25 +1.25 180   Age:  1 yr       Manifest Refraction      Sphere Cylinder Axis Dist VA   Right -2.00 +1.50 170 20/20-2   Left -1.00 +1.75 173 20/20-2          IMAGING AND PROCEDURES  Imaging and Procedures for _0 @  OCT, Retina - OU - Both Eyes       Right Eye Quality was good. Central Foveal Thickness: 256. Progression has no prior data. Findings include normal foveal contour, intraretinal fluid, no SRF (Cystic changes superior fovea).   Left Eye Quality was good. Central Foveal Thickness: 238. Progression has no prior data. Findings include normal foveal contour, no IRF, no SRF.   Notes *Images captured and stored on drive  Diagnosis / Impression:  OD: cystic changes NFP, no IRF/SRF OS   Clinical management:  See below  Abbreviations: NFP - Normal foveal profile. CME - cystoid macular edema. PED - pigment epithelial detachment. IRF - intraretinal fluid. SRF - subretinal fluid. EZ - ellipsoid zone. ERM - epiretinal membrane. ORA - outer retinal atrophy. ORT - outer retinal tubulation. SRHM - subretinal hyper-reflective material                 ASSESSMENT/PLAN:    ICD-10-CM   1. Moderate nonproliferative diabetic retinopathy of both eyes without macular edema associated with type 2 diabetes  mellitus ([place]) X83.3825   2. Retinal edema H35.81 OCT, Retina - OU - Both Eyes  3. Essential hypertension I10   4. Hypertensive retinopathy of both eyes H35.033   5. Pseudophakia of both eyes Z96.1   6. Anatomical narrow angle H40.039     1,2. Moderate nonproliferative diabetic retinopathy w/o DME, OU  - The incidence, risk factors for progression, natural history and treatment options for diabetic retinopathy were discussed with patient.    - The need for close monitoring of blood glucose, blood pressure, and serum lipids, avoiding cigarette or any type of tobacco, and the need for long term follow up was also discussed with patient.  - exam with scattered MA OU, OD with few focal exudates  - BCVA 20/20 OU  - OCT without diabetic macular edema OS, OD with mild cystic changes  - discussed findings and treatment options  - will monitor for now -- pt in agreement  - f/u in 4 mos -- DFE/OCT/possible FA transit OD  3,4. Hypertensive retinopathy OU - discussed importance of tight BP control - monitor  5. Pseudophakia OU  - s/p CE/IOL OU  - beautiful surgeries by Dr. Talbert Forest, doing well  - monitor  6. Anatomical Narrow Angles OU  - s/p LPI OU by Dr. Talbert Forest  - OS patent LPI and open angle  - OD w/ closed PI and narrow angle  - will refer back to Dr. Talbert Forest for evaluation and management    Ophthalmic Meds Ordered this visit:  No orders of the defined types were placed in this encounter.      Return in about 4 months (around 06/27/2018) for F/U NPDR OU, DFE, OCT.  There are no Patient Instructions on file for this visit.   Explained the diagnoses, plan, and follow up with the  patient and they expressed understanding.  Patient expressed understanding of the importance of proper follow up care.   This document serves as a record of services personally performed by Gardiner Sleeper, MD, PhD. It was created on their behalf by Ernest Mallick, OA, an ophthalmic assistant. The creation of  this record is the provider's dictation and/or activities during the visit.    Electronically signed by: Ernest Mallick, OA  12.30.19 12:28 PM    Gardiner Sleeper, M.D., Ph.D. Diseases & Surgery of the Retina and Vitreous [facility]  I have reviewed the above documentation for accuracy and completeness, and I agree with the above. Gardiner Sleeper, M.D., Ph.D. 02/27/18 12:28 PM    Abbreviations: M myopia (nearsighted); A astigmatism; H hyperopia (farsighted); P presbyopia; Mrx spectacle prescription;  CTL contact lenses; OD right eye; OS left eye; OU both eyes  XT exotropia; ET esotropia; PEK punctate epithelial keratitis; PEE punctate epithelial erosions; DES dry eye syndrome; MGD meibomian gland dysfunction; ATs artificial tears; PFAT's preservative free artificial tears; [place] nuclear sclerotic cataract; PSC posterior subcapsular cataract; ERM epi-retinal membrane; PVD posterior vitreous detachment; RD retinal detachment; DM diabetes mellitus; DR diabetic retinopathy; NPDR non-proliferative diabetic retinopathy; PDR proliferative diabetic retinopathy; CSME clinically significant macular edema; DME diabetic macular edema; dbh dot blot hemorrhages; CWS cotton wool spot; POAG primary open angle glaucoma; C/D cup-to-disc ratio; HVF humphrey visual field; GVF goldmann visual field; OCT optical coherence tomography; IOP intraocular pressure; BRVO Branch retinal vein occlusion; CRVO central retinal vein occlusion; CRAO central retinal artery occlusion; BRAO branch retinal artery occlusion; RT retinal tear; SB scleral buckle; PPV pars plana vitrectomy; VH Vitreous hemorrhage; PRP panretinal laser photocoagulation; IVK intravitreal kenalog; VMT vitreomacular traction; MH Macular hole;  NVD neovascularization of the disc; NVE neovascularization elsewhere; AREDS age related eye disease study; ARMD age related macular degeneration; POAG primary open angle glaucoma; EBMD epithelial/anterior  basement membrane dystrophy; ACIOL anterior chamber intraocular lens; IOL intraocular lens; PCIOL posterior chamber intraocular lens; Phaco/IOL phacoemulsification with intraocular lens placement; [place] photorefractive keratectomy; LASIK laser assisted in situ keratomileusis; HTN hypertension; DM diabetes mellitus; COPD chronic obstructive pulmonary disease

## 2018-02-27 ENCOUNTER — Encounter (INDEPENDENT_AMBULATORY_CARE_PROVIDER_SITE_OTHER): Payer: Self-pay | Admitting: Ophthalmology

## 2018-03-27 ENCOUNTER — Ambulatory Visit
Admission: RE | Admit: 2018-03-27 | Discharge: 2018-03-27 | Disposition: A | Discharge status: Home / Self Care | Payer: Medicare HMO | Source: Ambulatory Visit | Attending: Family Medicine | Admitting: Family Medicine

## 2018-03-27 ENCOUNTER — Other Ambulatory Visit: Payer: Self-pay | Admitting: Family Medicine

## 2018-03-27 DIAGNOSIS — E782 Mixed hyperlipidemia: Secondary | ICD-10-CM | POA: Diagnosis not present

## 2018-03-27 DIAGNOSIS — I10 Essential (primary) hypertension: Secondary | ICD-10-CM | POA: Diagnosis not present

## 2018-03-27 DIAGNOSIS — M549 Dorsalgia, unspecified: Secondary | ICD-10-CM

## 2018-03-27 DIAGNOSIS — M25551 Pain in right hip: Secondary | ICD-10-CM | POA: Diagnosis not present

## 2018-03-27 DIAGNOSIS — E1100 Type 2 diabetes mellitus with hyperosmolarity without nonketotic hyperglycemic-hyperosmolar coma (NKHHC): Secondary | ICD-10-CM | POA: Diagnosis not present

## 2018-03-27 DIAGNOSIS — M47817 Spondylosis without myelopathy or radiculopathy, lumbosacral region: Secondary | ICD-10-CM | POA: Diagnosis not present

## 2018-03-27 DIAGNOSIS — Z6830 Body mass index (BMI) 30.0-30.9, adult: Secondary | ICD-10-CM | POA: Diagnosis not present

## 2018-03-27 DIAGNOSIS — M130 Polyarthritis, unspecified: Secondary | ICD-10-CM | POA: Diagnosis not present

## 2018-04-08 DIAGNOSIS — Z6830 Body mass index (BMI) 30.0-30.9, adult: Secondary | ICD-10-CM | POA: Diagnosis not present

## 2018-04-08 DIAGNOSIS — I10 Essential (primary) hypertension: Secondary | ICD-10-CM | POA: Diagnosis not present

## 2018-04-08 DIAGNOSIS — E1100 Type 2 diabetes mellitus with hyperosmolarity without nonketotic hyperglycemic-hyperosmolar coma (NKHHC): Secondary | ICD-10-CM | POA: Diagnosis not present

## 2018-04-08 DIAGNOSIS — M545 Low back pain: Secondary | ICD-10-CM | POA: Diagnosis not present

## 2018-04-15 DIAGNOSIS — I6522 Occlusion and stenosis of left carotid artery: Secondary | ICD-10-CM | POA: Diagnosis not present

## 2018-04-17 ENCOUNTER — Other Ambulatory Visit (HOSPITAL_COMMUNITY): Payer: Self-pay | Admitting: Family Medicine

## 2018-04-17 ENCOUNTER — Other Ambulatory Visit: Payer: Self-pay | Admitting: Family Medicine

## 2018-04-17 DIAGNOSIS — I6522 Occlusion and stenosis of left carotid artery: Secondary | ICD-10-CM

## 2018-04-19 ENCOUNTER — Ambulatory Visit (HOSPITAL_COMMUNITY)
Admission: RE | Admit: 2018-04-19 | Discharge: 2018-04-19 | Disposition: A | Discharge status: Home / Self Care | Payer: Medicare HMO | Source: Ambulatory Visit | Attending: Family Medicine | Admitting: Family Medicine

## 2018-04-19 DIAGNOSIS — I6522 Occlusion and stenosis of left carotid artery: Secondary | ICD-10-CM | POA: Diagnosis not present

## 2018-04-19 DIAGNOSIS — I6523 Occlusion and stenosis of bilateral carotid arteries: Secondary | ICD-10-CM | POA: Diagnosis not present

## 2018-04-22 ENCOUNTER — Ambulatory Visit (HOSPITAL_COMMUNITY): Payer: Medicare HMO

## 2018-04-26 ENCOUNTER — Other Ambulatory Visit: Payer: Self-pay

## 2018-04-26 MED ORDER — ATORVASTATIN CALCIUM 10 MG PO TABS: 10.0000 mg | ORAL_TABLET | Freq: Every day | ORAL | 1 refills | Status: DC

## 2018-05-01 DIAGNOSIS — E1169 Type 2 diabetes mellitus with other specified complication: Secondary | ICD-10-CM | POA: Diagnosis not present

## 2018-05-01 DIAGNOSIS — I10 Essential (primary) hypertension: Secondary | ICD-10-CM | POA: Diagnosis not present

## 2018-05-01 DIAGNOSIS — E783 Hyperchylomicronemia: Secondary | ICD-10-CM | POA: Diagnosis not present

## 2018-05-10 ENCOUNTER — Other Ambulatory Visit: Payer: Medicare HMO

## 2018-05-13 ENCOUNTER — Other Ambulatory Visit: Payer: Medicare HMO

## 2018-05-14 ENCOUNTER — Other Ambulatory Visit: Payer: Self-pay | Admitting: Endocrinology

## 2018-05-15 ENCOUNTER — Ambulatory Visit: Payer: Medicare HMO | Admitting: Endocrinology

## 2018-06-27 ENCOUNTER — Encounter (INDEPENDENT_AMBULATORY_CARE_PROVIDER_SITE_OTHER): Payer: Medicare HMO | Admitting: Ophthalmology

## 2018-08-01 NOTE — Progress Notes (Signed)
[facility] Note  08/05/2018     CHIEF COMPLAINT Patient presents for Retina Follow Up   HISTORY OF PRESENT ILLNESS: Sandra Washington is a 78 y.o. female who presents to the clinic today for:   HPI    Retina Follow Up    Patient presents with  Diabetic Retinopathy.  In both eyes.  Severity is mild.  Duration of 5 months.  Since onset it is gradually worsening (OD>OS).  I, the attending physician,  performed the HPI with the patient and updated documentation appropriately.          Comments    5 month retinal eval-Patient states vision slightly worse OD>OS. BS was 92 this am. Last a1c was 7 in December 2019. On Pred Forte tid OU.       Last edited by Bernarda Caffey, MD on 08/05/2018  2:04 PM. (History)    pt states she feels like her vision is slightly worse than last time, she states she did not get new glasses bc she wanted to wait until she had all her eye exams, she states her blood sugar and blood pressure has been under control, she states she gets new blood work done in December for diabetes, but her last A1c was 7.0 in December, she states she takes metformin and 2 different insulins, she states her bp med was increased from 20m to 118m Referring physician: BlLucianne LeiMD [address] [place] [id number] INFORMATION:   Selected notes from the [facility] by Dr. MaMadelin Headingsor concern of diabetic retinopathy LEE: 12.02.19 (M. Cotter) [BCVA: OD: 20/50++ OS: 20/30++] Ocular Hx-hypermetropia, DES, keratocojunctivitis, pseudo OU, YAG OU, iridectomy PMH-DM (takes Humalog/Metformin), HLD, HTN    CURRENT MEDICATIONS: Current Outpatient Medications (Ophthalmic Drugs)  Medication Sig  . prednisoLONE acetate (PRED FORTE) 1 % ophthalmic suspension Place 1 drop into both eyes 3 (three) times daily.   No current facility-administered medications for this visit.  (Ophthalmic Drugs)   Current Outpatient  Medications (Other)  Medication Sig  . ACCU-CHEK AVIVA PLUS test strip USE 1 STRIP TO CHECK GLUCOSE 4 TIMES DAILY  . ACCU-CHEK SOFTCLIX LANCETS lancets Use bid  . amLODipine (NORVASC) 5 MG tablet   . aspirin EC 81 MG tablet Take 81 mg by mouth daily.  . Marland Kitchentorvastatin (LIPITOR) 10 MG tablet Take 1 tablet (10 mg total) by mouth daily.  . bisacodyl (DULCOLAX) 5 MG EC tablet Take 5 mg by mouth daily.  . Blood Glucose Monitoring Suppl (ACCU-CHEK AVIVA PLUS) w/Device KIT Use to check blood sugar 3-4 times per day.  . cetirizine (ZYRTEC) 10 MG tablet Take 10 mg by mouth daily as needed for allergies.   . Cholecalciferol (VITAMIN D3) 5000 UNITS CAPS Take 5,000 Units by mouth.  . Marland Kitchenlucose blood (ACCU-CHEK GUIDE) test strip 1 each by Other route 2 (two) times daily. Use as instructed  . losartan-hydrochlorothiazide (HYZAAR) 100-25 MG per tablet Take 1 tablet by mouth at bedtime.   . metFORMIN (GLUCOPHAGE) 1000 MG tablet Take 1 tablet in the morning and 1 tablets in the evening  . niacin (SLO-NIACIN) 500 MG tablet Take 500 mg by mouth at bedtime.  . Marland KitchenOVOLIN N RELION 100 UNIT/ML injection INJECT 8 UNITS SUBCUTANEOUSLY IN THE MORNING AND 12 UNITS IN THE EVENING  . NOVOLIN R RELION 100 UNIT/ML injection  INJECT 8 UNITS SUBCUTANEOUSLY WITH BREAKFAST AND LUNCH, AND 16 UNITS WITH DINNER  . omeprazole (  PRILOSEC) 40 MG capsule Take 40 mg by mouth daily.   No current facility-administered medications for this visit.  (Other)      REVIEW OF SYSTEMS: ROS    Positive for: Musculoskeletal, Endocrine, Eyes   Negative for: Constitutional, Gastrointestinal, Neurological, Skin, Genitourinary, HENT, Cardiovascular, Respiratory, Psychiatric, Allergic/Imm, Heme/Lymph   Last edited by Roselee Nova D on 08/05/2018  9:11 AM. (History)       ALLERGIES No Known Allergies  PAST MEDICAL HISTORY Past Medical History:  Diagnosis Date  . Chronic kidney disease    kidney stone  . Diabetes mellitus without complication  ([place])   . Hypertension   . Stroke [facility])    Past Surgical History:  Procedure Laterality Date  . BREAST BIOPSY Left   . CATARACT EXTRACTION Bilateral 2018   Dr. Tommy Rainwater  . HAND SURGERY    . NO PAST SURGERIES      FAMILY HISTORY History reviewed. No pertinent family history.  SOCIAL HISTORY Social History   Tobacco Use  . Smoking status: Former Research scientist (life sciences)  . Smokeless tobacco: Never Used  Substance Use Topics  . Alcohol use: No  . Drug use: No         OPHTHALMIC EXAM:  Base Eye Exam    Visual Acuity (Snellen - Linear)      Right Left   Dist cc 20/30 +1 20/40 +1   Dist ph cc NI 20/30   Correction:  Glasses       Tonometry (Tonopen, 9:24 AM)      Right Left   Pressure 13 13       Pupils      Dark Light Shape React APD   Right 4 3 Round Brisk None   Left 4 3 Round Brisk None       Visual Fields (Counting fingers)      Left Right    Full Full       Extraocular Movement      Right Left    Full, Ortho Full, Ortho       Neuro/Psych    Oriented x3:  Yes   Mood/Affect:  Normal       Dilation    Both eyes:  1.0% Mydriacyl, 2.5% Phenylephrine @ 9:24 AM        Slit Lamp and Fundus Exam    Slit Lamp Exam      Right Left   Lids/Lashes Dermatochalasis - upper lid Dermatochalasis - upper lid   Conjunctiva/Sclera nasal and temporal Pinguecula, Melanosis nasal and temporal Pinguecula, Melanosis   Cornea Arcus, 1+ Punctate epithelial erosions, Well healed temporal cataract wounds Arcus, 1+ Punctate epithelial erosions, Well healed temporal cataract wounds   Anterior Chamber moderate depth, Narrow angles moderate depth   Iris Round and moderately dilated, focal iris atrophy at 1100  --  PI not open Round and moderately dilated, small patent PI at 1000   Lens PC IOL in good position with open PC, mild elschnig pearls superiorly/inferiorly PC IOL in good position with open PC   Vitreous Vitreous syneresis Vitreous syneresis       Fundus Exam      Right Left    Disc Pink and Sharp Pink and Sharp   C/D Ratio 0.2 0.3   Macula Flat, Blunted foveal reflex, few Microaneurysms, +Cystic changes  Flat, Blunted foveal reflex, scattered Microaneurysms, prominent IRH nasal fovea, trace cystic changes   Vessels mild Vascular attenuation mild Vascular attenuation   Periphery Attached, scattered MA, scattered focal exudate, nasally  --  mild Attached, rare MA        Refraction    Wearing Rx      Sphere Cylinder Axis   Right -1.75 +1.00 180   Left -1.25 +1.25 180       Manifest Refraction      Sphere Cylinder Axis Dist VA   Right -1.50 +1.50 170 20/20-2   Left -1.25 +1.50 170 20/25+1          IMAGING AND PROCEDURES  Imaging and Procedures for '@TODAY' @  OCT, Retina - OU - Both Eyes       Right Eye Quality was good. Central Foveal Thickness: 264. Progression has improved. Findings include normal foveal contour, intraretinal fluid, no SRF (Cystic changes superior fovea -- slightly improved from prior).   Left Eye Quality was good. Central Foveal Thickness: 248. Progression has worsened. Findings include normal foveal contour, no SRF, intraretinal hyper-reflective material, intraretinal fluid.   Notes *Images captured and stored on drive  Diagnosis / Impression:  OD: interval improvement in tr cystic changes OS: interval increase in cystic changes and focal IRHM   Clinical management:  See below  Abbreviations: NFP - Normal foveal profile. CME - cystoid macular edema. PED - pigment epithelial detachment. IRF - intraretinal fluid. SRF - subretinal fluid. EZ - ellipsoid zone. ERM - epiretinal membrane. ORA - outer retinal atrophy. ORT - outer retinal tubulation. SRHM - subretinal hyper-reflective material        Fluorescein Angiography Optos (Transit OD)       Right Eye   Progression has no prior data. Early phase findings include microaneurysm, vascular perfusion defect. Mid/Late phase findings include microaneurysm, vascular perfusion  defect, leakage (Vascular perfusion defect temporal periphery ).   Left Eye   Progression has no prior data. Early phase findings include microaneurysm, vascular perfusion defect. Mid/Late phase findings include leakage, microaneurysm, vascular perfusion defect (Peripheral nonperfusion nasal periphery).   Notes Images stored on drive;   Impression: *8-10 second delay in fluorescein push on study Moderate NPDR OU - late leaking MA OU - no NV OU - patches of peripheral nonperfusion OU                  ASSESSMENT/PLAN:    ICD-10-CM   1. Moderate nonproliferative diabetic retinopathy of both eyes without macular edema associated with type 2 diabetes mellitus (HCC) H88.5027 Fluorescein Angiography Optos (Transit OD)  2. Retinal edema H35.81 OCT, Retina - OU - Both Eyes  3. Essential hypertension I10   4. Hypertensive retinopathy of both eyes H35.033 Fluorescein Angiography Optos (Transit OD)  5. Pseudophakia of both eyes Z96.1   6. Anatomical narrow angle H40.039     1,2. Moderate nonproliferative diabetic retinopathy w/o DME, OU  - exam with scattered MA OU, OD with few focal exudates  - BCVA slightly decreased to 20/30 OU from 20/25 OU  - OCT without frank diabetic macular edema, but mild cystic changes OU  - discussed findings and treatment options  - will monitor for now -- pt in agreement  - f/u in 3-4 mos -- DFE/OCT  3,4. Hypertensive retinopathy OU  - discussed importance of tight BP control  - monitor  5. Pseudophakia OU  - s/p CE/IOL OU  - beautiful surgeries by Dr. Talbert Forest, doing well  - monitor  6. Anatomical Narrow Angles OU  - s/p LPI OU by Dr. Talbert Forest  - OS patent LPI and open angle  - OD w/ closed PI and narrow angle  - will refer back  to Dr. Talbert Forest for evaluation and management    Ophthalmic Meds Ordered this visit:  No orders of the defined types were placed in this encounter.      Return for f/u 3-4 months NPDR OU, DFE, OCT.  There are no  Patient Instructions on file for this visit.   Explained the diagnoses, plan, and follow up with the patient and they expressed understanding.  Patient expressed understanding of the importance of proper follow up care.   This document serves as a record of services personally performed by Gardiner Sleeper, MD, PhD. It was created on their behalf by Ernest Mallick, OA, an ophthalmic assistant. The creation of this record is the provider's dictation and/or activities during the visit.    Electronically signed by: Ernest Mallick, OA  06.04.2020 5:52 PM     Gardiner Sleeper, M.D., Ph.D. Diseases & Surgery of the Retina and Vitreous [facility]  I have reviewed the above documentation for accuracy and completeness, and I agree with the above. Gardiner Sleeper, M.D., Ph.D. 08/05/18 5:52 PM     Abbreviations: M myopia (nearsighted); A astigmatism; H hyperopia (farsighted); P presbyopia; Mrx spectacle prescription;  CTL contact lenses; OD right eye; OS left eye; OU both eyes  XT exotropia; ET esotropia; PEK punctate epithelial keratitis; PEE punctate epithelial erosions; DES dry eye syndrome; MGD meibomian gland dysfunction; ATs artificial tears; PFAT's preservative free artificial tears; [place]ena nuclear sclerotic cataract; PSC posterior subcapsular cataract; ERM epi-retinal membrane; PVD posterior vitreous detachment; RD retinal detachment; DM diabetes mellitus; DR diabetic retinopathy; NPDR non-proliferative diabetic retinopathy; PDR proliferative diabetic retinopathy; CSME clinically significant macular edema; DME diabetic macular edema; dbh dot blot hemorrhages; CWS cotton wool spot; POAG primary open angle glaucoma; C/D cup-to-disc ratio; HVF humphrey visual field; GVF goldmann visual field; OCT optical coherence tomography; IOP intraocular pressure; BRVO Branch retinal vein occlusion; CRVO central retinal vein occlusion; CRAO central retinal artery occlusion; BRAO branch retinal  artery occlusion; RT retinal tear; SB scleral buckle; PPV pars plana vitrectomy; VH Vitreous hemorrhage; PRP panretinal laser photocoagulation; IVK intravitreal kenalog; VMT vitreomacular traction; MH Macular hole;  NVD neovascularization of the disc; NVE neovascularization elsewhere; AREDS age related eye disease study; ARMD age related macular degeneration; POAG primary open angle glaucoma; EBMD epithelial/anterior basement membrane dystrophy; ACIOL anterior chamber intraocular lens; IOL intraocular lens; PCIOL posterior chamber intraocular lens; Phaco/IOL phacoemulsification with intraocular lens placement; [place]iel photorefractive keratectomy; LASIK laser assisted in situ keratomileusis; HTN hypertension; DM diabetes mellitus; COPD chronic obstructive pulmonary disease

## 2018-08-05 ENCOUNTER — Encounter (INDEPENDENT_AMBULATORY_CARE_PROVIDER_SITE_OTHER): Payer: Self-pay | Admitting: Ophthalmology

## 2018-08-05 ENCOUNTER — Ambulatory Visit (INDEPENDENT_AMBULATORY_CARE_PROVIDER_SITE_OTHER): Payer: Medicare HMO | Admitting: Ophthalmology

## 2018-08-05 ENCOUNTER — Other Ambulatory Visit: Payer: Self-pay

## 2018-08-05 DIAGNOSIS — E113393 Type 2 diabetes mellitus with moderate nonproliferative diabetic retinopathy without macular edema, bilateral: Secondary | ICD-10-CM | POA: Diagnosis not present

## 2018-08-05 DIAGNOSIS — H3581 Retinal edema: Secondary | ICD-10-CM

## 2018-08-05 DIAGNOSIS — H40039 Anatomical narrow angle, unspecified eye: Secondary | ICD-10-CM

## 2018-08-05 DIAGNOSIS — Z961 Presence of intraocular lens: Secondary | ICD-10-CM | POA: Diagnosis not present

## 2018-08-05 DIAGNOSIS — I10 Essential (primary) hypertension: Secondary | ICD-10-CM | POA: Diagnosis not present

## 2018-08-05 DIAGNOSIS — H35033 Hypertensive retinopathy, bilateral: Secondary | ICD-10-CM

## 2018-09-02 DIAGNOSIS — Z0100 Encounter for examination of eyes and vision without abnormal findings: Secondary | ICD-10-CM | POA: Diagnosis not present

## 2018-09-30 ENCOUNTER — Other Ambulatory Visit: Payer: Self-pay

## 2018-09-30 ENCOUNTER — Other Ambulatory Visit (HOSPITAL_COMMUNITY): Payer: Self-pay | Admitting: Family Medicine

## 2018-09-30 ENCOUNTER — Ambulatory Visit (HOSPITAL_COMMUNITY)
Admission: RE | Admit: 2018-09-30 | Discharge: 2018-09-30 | Disposition: A | Discharge status: Home / Self Care | Payer: Medicare HMO | Source: Ambulatory Visit | Attending: Family Medicine | Admitting: Family Medicine

## 2018-09-30 DIAGNOSIS — M546 Pain in thoracic spine: Secondary | ICD-10-CM | POA: Diagnosis not present

## 2018-09-30 DIAGNOSIS — M130 Polyarthritis, unspecified: Secondary | ICD-10-CM | POA: Diagnosis not present

## 2018-10-22 ENCOUNTER — Other Ambulatory Visit: Payer: Self-pay

## 2018-10-22 ENCOUNTER — Ambulatory Visit (HOSPITAL_COMMUNITY): Payer: Medicare HMO | Attending: Family Medicine

## 2018-10-22 ENCOUNTER — Encounter (HOSPITAL_COMMUNITY): Payer: Self-pay

## 2018-10-22 DIAGNOSIS — M6281 Muscle weakness (generalized): Secondary | ICD-10-CM

## 2018-10-22 DIAGNOSIS — M546 Pain in thoracic spine: Secondary | ICD-10-CM | POA: Diagnosis not present

## 2018-10-22 NOTE — Therapy (Signed)
Sandra Washington, [place], [address] Phone: [phone]   Fax:  [phone]  Physical Therapy Evaluation  Patient Details  Name: Sandra Washington MRN: [id number] Date of Birth: Dec 22, 1940 Referring Provider (PT): Lucianne Lei, MD   Encounter Date: 10/22/2018  PT End of Session - 10/22/18 1014    Visit Number  1    Number of Visits  8    Date for PT Re-Evaluation  11/22/18    Authorization Type  [employer]   no visit limit, $40 copay   Authorization Time Period  10/22/18 to 11/22/18    Authorization - Visit Number  1    Authorization - Number of Visits  10    PT Start Time  0926    PT Stop Time  1010    PT Time Calculation (min)  44 min    Activity Tolerance  Patient tolerated treatment well    Behavior During Therapy  [facility] for tasks assessed/performed       Past Medical History:  Diagnosis Date  . Chronic kidney disease    kidney stone  . Diabetes mellitus without complication ([place])   . Hypertension   . Stroke [facility])     Past Surgical History:  Procedure Laterality Date  . BREAST BIOPSY Left   . CATARACT EXTRACTION Bilateral 2018   Dr. Tommy Rainwater  . HAND SURGERY    . NO PAST SURGERIES      There were no vitals filed for this visit.   Subjective Assessment - 10/22/18 0929    Subjective  Pt reports about 6 weeks ago her R mid back started hurting, didn't have an accident or fall or reach overhead for anything. Pt denies numbness/tingling, changes in b/b, or increase in dropping objects. Pt reports stroke about 40 years ago without any residual effects. Pt reports difficulty with household chores, prolonged standing, sitting without cushioned back, and pain occasionally wakes her up and limits driving. Pt reports 9/10 pain with activity and 5/10 pain at rest and occasionally eases away when sitting straight with back support.    Pertinent History  stroke (40+ years ago without residual effects), type II  diabetes, Chronic kidney disease    Limitations  Sitting;Standing;House hold activities    How long can you sit comfortably?  15-20 minutes    How long can you stand comfortably?  15-20 minutes    How long can you walk comfortably?  15-20 minutes    Diagnostic tests  X-ray negative for thoracic spine    Patient Stated Goals  to get this knot out of my back    Currently in Pain?  Yes    Pain Score  4     Pain Location  Back    Pain Orientation  Right;Mid   mid thoracic level   Pain Descriptors / Indicators  Spasm;Tightness   knot   Pain Type  Acute pain    Pain Onset  More than a month ago    Pain Frequency  Intermittent    Aggravating Factors   standing, walking, household chores    Pain Relieving Factors  sitting with straight cushioned back, tylenol, ice and heat         [facility] PT Assessment - 10/22/18 0001      Assessment   Medical Diagnosis  Back Pain    Referring Provider (PT)  Lucianne Lei, MD    Onset Date/Surgical Date  --   about 6 weeks ago  Next MD Visit  Sept 14 approximately    Prior Therapy  Yes, low back pain with + results      Precautions   Precautions  None      Restrictions   Weight Bearing Restrictions  No      Balance Screen   Has the patient fallen in the past 6 months  No    Has the patient had a decrease in activity level because of a fear of falling?   No    Is the patient reluctant to leave their home because of a fear of falling?   No      Prior Function   Level of Independence  Independent    Vocation  Retired    Leisure  cards with friends, sit on porch      Cognition   Overall Cognitive Status  Within Functional Limits for tasks assessed      Observation/Other Assessments   Focus on Therapeutic Outcomes (FOTO)   49% limited      Sensation   Light Touch  Appears Intact      Functional Tests   Functional tests  Sit to Stand      Sit to Stand   Comments  5x STS: 13.77 sec      Posture/Postural Control   Posture/Postural  Control  No significant limitations    Posture Comments  slightly decreased thoracic curve      ROM / Strength   AROM / PROM / Strength  AROM;Strength      AROM   Overall AROM Comments  Thoracic spine maintains flat curve throughout all motions, increased R pain with sidebending towards and away    AROM Assessment Site  Lumbar;Thoracic    Lumbar Flexion  WNL    Lumbar Extension  WNL    Lumbar - Right Side Bend  jt line    Lumbar - Left Side Bend  jt line    Lumbar - Right Rotation  50%  limited    Lumbar - Left Rotation  50% limited    Thoracic Flexion  75% limited   maintains flat back   Thoracic Extension  75% limited   maintains flat back   Thoracic - Right Side Bend  50% limited   pain on R   Thoracic - Left Side Bend  50% limited   pain on R   Thoracic - Right Rotation  50% limited    Thoracic - Left Rotation  50% limited      Strength   Strength Assessment Site  Hip;Knee;Ankle    Right Hip Flexion  3+/5    Right Hip Extension  3/5    Right Hip ABduction  3+/5    Left Hip Flexion  4/5    Left Hip Extension  3/5    Left Hip ABduction  4/5    Right Knee Flexion  4/5    Right Knee Extension  5/5    Left Knee Flexion  4/5    Left Knee Extension  5/5    Right Ankle Dorsiflexion  5/5    Left Ankle Dorsiflexion  5/5      Flexibility   Soft Tissue Assessment /Muscle Length  yes    Hamstrings  90/90: 110 deg bil    Quadriceps  Ely's: tightness bil    ITB  WNL bil    Piriformis  decreased mobility bil      Palpation   Spinal mobility  normal mobility throughout thoracic and lumbar spine  Palpation comment  increased pain, palpable restrictions throughout R thoracic paraspinals T7-T12      Special Tests    Special Tests  Lumbar    Lumbar Tests  Slump Test;Straight Leg Raise      Slump test   Findings  Negative    Comment  bil      Straight Leg Raise   Findings  Negative    Comment  bil      Ambulation/Gait   Gait Comments  observed pt ambulate in/out of  clinic without abdnomal gait mechanics, no loss of balance, and no AD used      Balance   Balance Assessed  Yes      Static Standing Balance   Static Standing - Balance Support  No upper extremity supported    Static Standing Balance -  Activities   Single Leg Stance - Right Leg;Single Leg Stance - Left Leg    Static Standing - Comment/# of Minutes  R: 15 sec or <, L: 5 sec or <          Objective measurements completed on examination: See above findings.       PT Education - 10/22/18 1019    Education Details  Assessment findings, FOTO score, POC, initiated HEP    Person(s) Educated  Patient    Methods  Explanation;Demonstration;Handout    Comprehension  Verbalized understanding;Returned demonstration       PT Short Term Goals - 10/22/18 1011      PT SHORT TERM GOAL #1   Title  Pt will be indpendent with HEP, perform consistently and update PRN.    Time  2    Period  Weeks    Status  New    Target Date  11/05/18      PT SHORT TERM GOAL #2   Title  Pt will perform household chores for 30 minutes with 5/10 mid back pain at worst to improve QoL.    Time  2    Period  Weeks    Status  New        PT Long Term Goals - 10/22/18 1012      PT LONG TERM GOAL #1   Title  Pt will perform household chores for 60 minutes with 2/10 mid back pain at worst to improve QoL.    Time  4    Period  Weeks    Status  New    Target Date  11/22/18      PT LONG TERM GOAL #2   Title  Pt will improve BLE strength by 1 grade to improve ability to perform functional tasks without pain.    Time  4    Period  Weeks    Status  New      PT LONG TERM GOAL #3   Title  Pt will improve thoracic AROM to WNL to improve ability to perform household chores and recreational activities.    Time  4    Period  Weeks    Status  New      PT LONG TERM GOAL #4   Title  Pt will report no sleep disturbances due to mid back pain.    Time  4    Period  Weeks    Status  New      PT LONG TERM GOAL  #5   Title  Pt will report no limitations with prolonged sitting or standing to demo reduction in palpable restrictions and improvement in pain throughout  R thoracic paraspinals.    Time  4    Period  Weeks    Status  New             Plan - 10/22/18 1022    Clinical Impression Statement  Pt is a pleasant 78YO female with new mid back pain starting 6 weeks ago without injury. Subjectively pt reports increase in pain with all household chores requiring rest breaks, limited with sitting, standing and walking and occasional waking with pain in night and limits driving. Objectively, pt demonstrates weakness throughout BLE, decreased hamstring, quad and piriformis flexibility bilaterally, deficits in balance, impaired self-perceived limitations per FOTO score, decreased AROM with throughout thoracic spine and increased pain with mobility. With palpation, pt with good PA mobility throughout thoracic and lumbar spine and denies pain, but with increased palpable restrictions throughout R thoracic paraspinals Pt would benefit from skilled PT to improve deficits noted, establish HEP and reduce pain to improve QoL.    Personal Factors and Comorbidities  Age;Comorbidity 1    Comorbidities  HTN    Examination-Activity Limitations  Carry;Lift;Reach Overhead;Sleep;Stand;Locomotion Level    Examination-Participation Restrictions  Cleaning;Driving    Stability/Clinical Decision Making  Stable/Uncomplicated    Clinical Decision Making  Low    Rehab Potential  Good    PT Frequency  2x / week    PT Duration  4 weeks    PT Treatment/Interventions  ADLs/Self Care Home Management;Aquatic Therapy;Biofeedback;Cryotherapy;Electrical Stimulation;Iontophoresis 4mg /ml Dexamethasone;Moist Heat;Traction;Ultrasound;DME Instruction;Gait training;Stair training;Functional mobility training;Therapeutic activities;Therapeutic exercise;Balance training;Neuromuscular re-education;Patient/family education;Orthotic  Fit/Training;Manual techniques;Passive range of motion;Dry needling;Taping;Joint Manipulations    PT Next Visit Plan  Review goals. Initiate stretching through thoracic spine using physioball, seated rotation, QL stretch. Begin strengthening throughout postural muscles. Consider manual STM to thoracic paraspinals if pt able to tolerate.    PT Home Exercise Plan  Eval: seated thoracic rotation, STS    Consulted and Agree with Plan of Care  Patient       Patient will benefit from skilled therapeutic intervention in order to improve the following deficits and impairments:  Decreased balance, Decreased range of motion, Decreased strength, Difficulty walking, Increased fascial restricitons, Increased muscle spasms, Impaired perceived functional ability, Impaired flexibility, Improper body mechanics, Pain  Visit Diagnosis: Pain in thoracic spine - Plan: PT plan of care cert/re-cert  Muscle weakness (generalized) - Plan: PT plan of care cert/re-cert     Problem List Patient Active Problem List   Diagnosis Date Noted  . Hypertension, essential 10/30/2014  . Other and unspecified hyperlipidemia 11/06/2012  . Unspecified essential hypertension 11/06/2012  . Type II or unspecified type diabetes mellitus without mention of complication, uncontrolled 10/23/2012    Talbot Grumbling PT, DPT 10/22/18, 10:38 AM [place], [place], [address] Phone: [phone]   Fax:  [phone]  Name: MARATHA MURDEN MRN: [id number] Date of Birth: Jun 29, 1940

## 2018-10-24 ENCOUNTER — Other Ambulatory Visit: Payer: Self-pay

## 2018-10-24 ENCOUNTER — Ambulatory Visit (HOSPITAL_COMMUNITY): Payer: Medicare HMO | Admitting: Physical Therapy

## 2018-10-24 DIAGNOSIS — M546 Pain in thoracic spine: Secondary | ICD-10-CM | POA: Diagnosis not present

## 2018-10-24 DIAGNOSIS — M6281 Muscle weakness (generalized): Secondary | ICD-10-CM | POA: Diagnosis not present

## 2018-10-24 NOTE — Therapy (Signed)
[place], [place], [address] Phone: [phone]   Fax:  [phone]  Physical Therapy Treatment  Patient Details  Name: Sandra Washington MRN: [id number] Date of Birth: Jul 16, 1940 Referring Provider (PT): Lucianne Lei, MD   Encounter Date: 10/24/2018  PT End of Session - 10/24/18 1219    Visit Number  2    Number of Visits  8    Date for PT Re-Evaluation  11/22/18    Authorization Type  [employer]   no visit limit, $40 copay   Authorization Time Period  10/22/18 to 11/22/18    Authorization - Visit Number  2    Authorization - Number of Visits  10    PT Start Time  1120    PT Stop Time  1200    PT Time Calculation (min)  40 min    Activity Tolerance  Patient tolerated treatment well    Behavior During Therapy  [facility] for tasks assessed/performed       Past Medical History:  Diagnosis Date  . Chronic kidney disease    kidney stone  . Diabetes mellitus without complication ([place])   . Hypertension   . Stroke [facility])     Past Surgical History:  Procedure Laterality Date  . BREAST BIOPSY Left   . CATARACT EXTRACTION Bilateral 2018   Dr. Tommy Rainwater  . HAND SURGERY    . NO PAST SURGERIES      There were no vitals filed for this visit.  Subjective Assessment - 10/24/18 1123    Subjective  pt states she woke up about 3am with discomfort and used the heating pad to go back to sleep.  Reports compliance with HEP.  Currently 5/10 pain.    Currently in Pain?  Yes    Pain Score  5     Pain Location  Back    Pain Orientation  Right;Mid;Upper    Pain Descriptors / Indicators  Tightness;Sore;Spasm                       [facility] Adult PT Treatment/Exercise - 10/24/18 0001      Neck Exercises: Standing   Upper Extremity Flexion with Stabilization  10 reps    Other Standing Exercises  wall arch 10 reps      Neck Exercises: Seated   W Back  10 reps    Shoulder Flexion  Both;10 reps     Shoulder ABduction  Both;10 reps    Other Seated Exercise  thoracic excursions with UE crossed 5 reps each    Other Seated Exercise  physioball midback stretch forward and each side 5X10"       Neck Exercises: Stretches   Corner Stretch  3 reps;20 seconds             PT Education - 10/24/18 1200    Education Details  reviewed HEP, goals and POC moving forward    Person(s) Educated  Patient    Methods  Explanation    Comprehension  Verbalized understanding       PT Short Term Goals - 10/24/18 1158      PT SHORT TERM GOAL #1   Title  Pt will be indpendent with HEP, perform consistently and update PRN.    Time  2    Period  Weeks    Status  On-going    Target Date  11/05/18      PT SHORT TERM GOAL #2  Title  Pt will perform household chores for 30 minutes with 5/10 mid back pain at worst to improve QoL.    Time  2    Period  Weeks    Status  On-going        PT Long Term Goals - 10/24/18 1158      PT LONG TERM GOAL #1   Title  Pt will perform household chores for 60 minutes with 2/10 mid back pain at worst to improve QoL.    Time  4    Period  Weeks    Status  On-going      PT LONG TERM GOAL #2   Title  Pt will improve BLE strength by 1 grade to improve ability to perform functional tasks without pain.    Time  4    Period  Weeks    Status  On-going      PT LONG TERM GOAL #3   Title  Pt will improve thoracic AROM to WNL to improve ability to perform household chores and recreational activities.    Time  4    Period  Weeks    Status  On-going      PT LONG TERM GOAL #4   Title  Pt will report no sleep disturbances due to mid back pain.    Time  4    Period  Weeks    Status  On-going      PT LONG TERM GOAL #5   Title  Pt will report no limitations with prolonged sitting or standing to demo reduction in palpable restrictions and improvement in pain throughout R thoracic paraspinals.    Time  4    Period  Weeks    Status  On-going             Plan - 10/24/18 1219    Clinical Impression Statement  reviewed HEP and goals per treatment plan.  Pt able to complete exercises with minimal cues for correction.  No pain voiced during or at conclusion of session.  Added stretches, thoracic ROM and strengthening exercises for posture this session.  Pt reported reduced symptoms/pain at end of session.    Personal Factors and Comorbidities  Age;Comorbidity 1    Comorbidities  HTN    Examination-Activity Limitations  Carry;Lift;Reach Overhead;Sleep;Stand;Locomotion Level    Examination-Participation Restrictions  Cleaning;Driving    Stability/Clinical Decision Making  Stable/Uncomplicated    Rehab Potential  Good    PT Frequency  2x / week    PT Duration  4 weeks    PT Treatment/Interventions  ADLs/Self Care Home Management;Aquatic Therapy;Biofeedback;Cryotherapy;Electrical Stimulation;Iontophoresis 4mg /ml Dexamethasone;Moist Heat;Traction;Ultrasound;DME Instruction;Gait training;Stair training;Functional mobility training;Therapeutic activities;Therapeutic exercise;Balance training;Neuromuscular re-education;Patient/family education;Orthotic Fit/Training;Manual techniques;Passive range of motion;Dry needling;Taping;Joint Manipulations    PT Next Visit Plan  Begin tband postural strengthening. Consider manual STM to thoracic paraspinals if pt able to tolerate.    PT Home Exercise Plan  Eval: seated thoracic rotation, STS    Consulted and Agree with Plan of Care  Patient       Patient will benefit from skilled therapeutic intervention in order to improve the following deficits and impairments:  Decreased balance, Decreased range of motion, Decreased strength, Difficulty walking, Increased fascial restricitons, Increased muscle spasms, Impaired perceived functional ability, Impaired flexibility, Improper body mechanics, Pain  Visit Diagnosis: Pain in thoracic spine  Muscle weakness (generalized)     Problem List Patient Active  Problem List   Diagnosis Date Noted  . Hypertension, essential 10/30/2014  . Other and  unspecified hyperlipidemia 11/06/2012  . Unspecified essential hypertension 11/06/2012  . Type II or unspecified type diabetes mellitus without mention of complication, uncontrolled 10/23/2012   Teena Irani, PTA/CLT [phone]  Teena Irani 10/24/2018, 12:23 PM  [place], [place], [address] Phone: [phone]   Fax:  [phone]  Name: Sandra Washington MRN: [id number] Date of Birth: 1941/01/14

## 2018-10-28 ENCOUNTER — Other Ambulatory Visit: Payer: Self-pay

## 2018-10-28 ENCOUNTER — Ambulatory Visit (HOSPITAL_COMMUNITY): Payer: Medicare HMO | Admitting: Physical Therapy

## 2018-10-28 DIAGNOSIS — M546 Pain in thoracic spine: Secondary | ICD-10-CM | POA: Diagnosis not present

## 2018-10-28 DIAGNOSIS — M6281 Muscle weakness (generalized): Secondary | ICD-10-CM | POA: Diagnosis not present

## 2018-10-28 NOTE — Therapy (Signed)
[place], [place], [address] Phone: [phone]   Fax:  [phone]  Physical Therapy Treatment  Patient Details  Name: Sandra Washington MRN: [id number] Date of Birth: 05-29-1940 Referring Provider (PT): Lucianne Lei, MD   Encounter Date: 10/28/2018  PT End of Session - 10/28/18 1710    Visit Number  3    Number of Visits  8    Date for PT Re-Evaluation  11/22/18    Authorization Type  [employer]   no visit limit, $40 copay   Authorization Time Period  10/22/18 to 11/22/18    Authorization - Visit Number  3    Authorization - Number of Visits  10    PT Start Time  1318    PT Stop Time  1356    PT Time Calculation (min)  38 min    Activity Tolerance  Patient tolerated treatment well    Behavior During Therapy  [facility] for tasks assessed/performed       Past Medical History:  Diagnosis Date  . Chronic kidney disease    kidney stone  . Diabetes mellitus without complication ([place])   . Hypertension   . Stroke [facility])     Past Surgical History:  Procedure Laterality Date  . BREAST BIOPSY Left   . CATARACT EXTRACTION Bilateral 2018   Dr. Tommy Rainwater  . HAND SURGERY    . NO PAST SURGERIES      There were no vitals filed for this visit.  Subjective Assessment - 10/28/18 1336    Subjective  pt states it is much better, 1.5/10.  States she's been sleeping good and has not been woke due to pain since Thursday.    Currently in Pain?  Yes    Pain Score  2     Pain Location  Back                       [facility] Adult PT Treatment/Exercise - 10/28/18 0001      Neck Exercises: Standing   Other Standing Exercises  wall arch 10 reps    Other Standing Exercises  postural scap retr, rows and extensions 10 reps each RTB      Neck Exercises: Seated   Other Seated Exercise  physioball midback stretch forward and each side 5X10"       Manual Therapy   Manual Therapy  Soft tissue mobilization    Manual therapy comments  completed prone at end of session    Soft tissue mobilization  to Rt thoracic/subscap region      Neck Exercises: Stretches   Corner Stretch  3 reps;20 seconds               PT Short Term Goals - 10/24/18 1158      PT SHORT TERM GOAL #1   Title  Pt will be indpendent with HEP, perform consistently and update PRN.    Time  2    Period  Weeks    Status  On-going    Target Date  11/05/18      PT SHORT TERM GOAL #2   Title  Pt will perform household chores for 30 minutes with 5/10 mid back pain at worst to improve QoL.    Time  2    Period  Weeks    Status  On-going        PT Long Term Goals - 10/24/18 1158  PT LONG TERM GOAL #1   Title  Pt will perform household chores for 60 minutes with 2/10 mid back pain at worst to improve QoL.    Time  4    Period  Weeks    Status  On-going      PT LONG TERM GOAL #2   Title  Pt will improve BLE strength by 1 grade to improve ability to perform functional tasks without pain.    Time  4    Period  Weeks    Status  On-going      PT LONG TERM GOAL #3   Title  Pt will improve thoracic AROM to WNL to improve ability to perform household chores and recreational activities.    Time  4    Period  Weeks    Status  On-going      PT LONG TERM GOAL #4   Title  Pt will report no sleep disturbances due to mid back pain.    Time  4    Period  Weeks    Status  On-going      PT LONG TERM GOAL #5   Title  Pt will report no limitations with prolonged sitting or standing to demo reduction in palpable restrictions and improvement in pain throughout R thoracic paraspinals.    Time  4    Period  Weeks    Status  On-going            Plan - 10/28/18 1719    Clinical Impression Statement  Pt overall improving.  continued with established POC with addition of postural strengthening and UBE as warm up.  Manual completed in prone at EOS with tightness bilaterally and spasm reduced Rt subscapular region.  Pt  reported overall relief following manual.    Personal Factors and Comorbidities  Age;Comorbidity 1    Comorbidities  HTN    Examination-Activity Limitations  Carry;Lift;Reach Overhead;Sleep;Stand;Locomotion Level    Examination-Participation Restrictions  Cleaning;Driving    Stability/Clinical Decision Making  Stable/Uncomplicated    Rehab Potential  Good    PT Frequency  2x / week    PT Duration  4 weeks    PT Treatment/Interventions  ADLs/Self Care Home Management;Aquatic Therapy;Biofeedback;Cryotherapy;Electrical Stimulation;Iontophoresis 4mg /ml Dexamethasone;Moist Heat;Traction;Ultrasound;DME Instruction;Gait training;Stair training;Functional mobility training;Therapeutic activities;Therapeutic exercise;Balance training;Neuromuscular re-education;Patient/family education;Orthotic Fit/Training;Manual techniques;Passive range of motion;Dry needling;Taping;Joint Manipulations    PT Next Visit Plan  Continue to progress postural strength with reduction of pain/spasms.    PT Home Exercise Plan  Eval: seated thoracic rotation, STS    Consulted and Agree with Plan of Care  Patient       Patient will benefit from skilled therapeutic intervention in order to improve the following deficits and impairments:  Decreased balance, Decreased range of motion, Decreased strength, Difficulty walking, Increased fascial restricitons, Increased muscle spasms, Impaired perceived functional ability, Impaired flexibility, Improper body mechanics, Pain  Visit Diagnosis: Pain in thoracic spine  Muscle weakness (generalized)     Problem List Patient Active Problem List   Diagnosis Date Noted  . Hypertension, essential 10/30/2014  . Other and unspecified hyperlipidemia 11/06/2012  . Unspecified essential hypertension 11/06/2012  . Type II or unspecified type diabetes mellitus without mention of complication, uncontrolled 10/23/2012    Roseanne Reno B 10/28/2018, 5:27 PM  [place], [place], [address] Phone: [phone]   Fax:  [phone]  Name: Sandra Washington MRN: [id number] Date of Birth: [birth year]

## 2018-10-30 ENCOUNTER — Ambulatory Visit (HOSPITAL_COMMUNITY): Payer: Medicare HMO | Attending: Family Medicine | Admitting: Physical Therapy

## 2018-10-30 ENCOUNTER — Other Ambulatory Visit: Payer: Self-pay

## 2018-10-30 DIAGNOSIS — M6281 Muscle weakness (generalized): Secondary | ICD-10-CM

## 2018-10-30 DIAGNOSIS — M546 Pain in thoracic spine: Secondary | ICD-10-CM | POA: Diagnosis not present

## 2018-10-30 NOTE — Therapy (Signed)
[place], [place], [address] Phone: [phone]   Fax:  [phone]  Physical Therapy Treatment  Patient Details  Name: Sandra Washington MRN: [id number] Date of Birth: 1940-03-21 Referring Provider (PT): Lucianne Lei, MD   Encounter Date: 10/30/2018  PT End of Session - 10/30/18 1205    Visit Number  4    Number of Visits  8    Date for PT Re-Evaluation  11/22/18    Authorization Type  [employer]   no visit limit, $40 copay   Authorization Time Period  10/22/18 to 11/22/18    Authorization - Visit Number  4    Authorization - Number of Visits  10    PT Start Time  1053    PT Stop Time  1133    PT Time Calculation (min)  40 min    Activity Tolerance  Patient tolerated treatment well    Behavior During Therapy  [facility] for tasks assessed/performed       Past Medical History:  Diagnosis Date  . Chronic kidney disease    kidney stone  . Diabetes mellitus without complication ([place])   . Hypertension   . Stroke [facility])     Past Surgical History:  Procedure Laterality Date  . BREAST BIOPSY Left   . CATARACT EXTRACTION Bilateral 2018   Dr. Tommy Rainwater  . HAND SURGERY    . NO PAST SURGERIES      There were no vitals filed for this visit.  Subjective Assessment - 10/30/18 1059    Subjective  pt states she was sore after the massage but it helped.  Overall getting better each visit.    Currently in Pain?  Yes    Pain Score  2     Pain Location  Back    Pain Orientation  Right;Mid;Upper    Pain Descriptors / Indicators  Sore;Tightness                       [facility] Adult PT Treatment/Exercise - 10/30/18 0001      Neck Exercises: Machines for Strengthening   UBE (Upper Arm Bike)  4 minutes backward      Neck Exercises: Standing   Upper Extremity Flexion with Stabilization  10 reps    Other Standing Exercises  wall arch 10 reps    Other Standing Exercises  postural scap retr, rows and  extensions 10 reps each RTB      Neck Exercises: Seated   Other Seated Exercise  midback seated stretch 3X20"      Neck Exercises: Prone   Shoulder Extension  10 reps    Rows  10 reps    Upper Extremity Flexion with Stabilization  5 reps      Manual Therapy   Manual Therapy  Soft tissue mobilization    Manual therapy comments  completed prone at end of session    Soft tissue mobilization  to Rt thoracic/subscap region               PT Short Term Goals - 10/24/18 1158      PT SHORT TERM GOAL #1   Title  Pt will be indpendent with HEP, perform consistently and update PRN.    Time  2    Period  Weeks    Status  On-going    Target Date  11/05/18      PT SHORT TERM GOAL #2   Title  Pt  will perform household chores for 30 minutes with 5/10 mid back pain at worst to improve QoL.    Time  2    Period  Weeks    Status  On-going        PT Long Term Goals - 10/24/18 1158      PT LONG TERM GOAL #1   Title  Pt will perform household chores for 60 minutes with 2/10 mid back pain at worst to improve QoL.    Time  4    Period  Weeks    Status  On-going      PT LONG TERM GOAL #2   Title  Pt will improve BLE strength by 1 grade to improve ability to perform functional tasks without pain.    Time  4    Period  Weeks    Status  On-going      PT LONG TERM GOAL #3   Title  Pt will improve thoracic AROM to WNL to improve ability to perform household chores and recreational activities.    Time  4    Period  Weeks    Status  On-going      PT LONG TERM GOAL #4   Title  Pt will report no sleep disturbances due to mid back pain.    Time  4    Period  Weeks    Status  On-going      PT LONG TERM GOAL #5   Title  Pt will report no limitations with prolonged sitting or standing to demo reduction in palpable restrictions and improvement in pain throughout R thoracic paraspinals.    Time  4    Period  Weeks    Status  On-going            Plan - 10/30/18 1206     Clinical Impression Statement  Continued with established POC with addition of prone strengthening exercises this session.  OVerall less tightness palpated bilaterally with manual and no spasms/trigger points located this session.  Overall improving.    Personal Factors and Comorbidities  Age;Comorbidity 1    Comorbidities  HTN    Examination-Activity Limitations  Carry;Lift;Reach Overhead;Sleep;Stand;Locomotion Level    Examination-Participation Restrictions  Cleaning;Driving    Stability/Clinical Decision Making  Stable/Uncomplicated    Rehab Potential  Good    PT Frequency  2x / week    PT Duration  4 weeks    PT Treatment/Interventions  ADLs/Self Care Home Management;Aquatic Therapy;Biofeedback;Cryotherapy;Electrical Stimulation;Iontophoresis 4mg /ml Dexamethasone;Moist Heat;Traction;Ultrasound;DME Instruction;Gait training;Stair training;Functional mobility training;Therapeutic activities;Therapeutic exercise;Balance training;Neuromuscular re-education;Patient/family education;Orthotic Fit/Training;Manual techniques;Passive range of motion;Dry needling;Taping;Joint Manipulations    PT Next Visit Plan  Continue to progress postural strength with reduction of pain/spasms.    PT Home Exercise Plan  Eval: seated thoracic rotation, STS    Consulted and Agree with Plan of Care  Patient       Patient will benefit from skilled therapeutic intervention in order to improve the following deficits and impairments:  Decreased balance, Decreased range of motion, Decreased strength, Difficulty walking, Increased fascial restricitons, Increased muscle spasms, Impaired perceived functional ability, Impaired flexibility, Improper body mechanics, Pain  Visit Diagnosis: Pain in thoracic spine  Muscle weakness (generalized)     Problem List Patient Active Problem List   Diagnosis Date Noted  . Hypertension, essential 10/30/2014  . Other and unspecified hyperlipidemia 11/06/2012  . Unspecified essential  hypertension 11/06/2012  . Type II or unspecified type diabetes mellitus without mention of complication, uncontrolled 10/23/2012   Amy B  Mare Ferrari, PTA/CLT [phone]  Teena Irani 10/30/2018, 12:07 PM  [place], [place], [address] Phone: [phone]   Fax:  [phone]  Name: Sandra Washington MRN: [id number] Date of Birth: Jan 14, 1941

## 2018-11-04 NOTE — Progress Notes (Signed)
[facility] Note  11/05/2018     CHIEF COMPLAINT Patient presents for Retina Follow Up   HISTORY OF PRESENT ILLNESS: Sandra Washington is a 78 y.o. female who presents to the clinic today for:   HPI    Retina Follow Up    Patient presents with  Diabetic Retinopathy.  In both eyes.  This started weeks ago.  Severity is moderate.  Duration of weeks.  Since onset it is stable.  I, the attending physician,  performed the HPI with the patient and updated documentation appropriately.          Comments    Patient got new glasses about a month ago and is still adjusting to them.  Patient denies eye pain or discomfort and denies any new or worsening floaters or fol.       Last edited by Bernarda Caffey, MD on 11/05/2018  9:23 AM. (History)    pt states she has not heard anything from Dr. Talbert Forest' office since her last appt here   Referring physician: Lucianne Lei, MD [address] [place],  [place] [address]  HISTORICAL INFORMATION:   Selected notes from the MEDICAL RECORD NUMBER Referred by Dr. Madelin Headings for concern of diabetic retinopathy LEE: 12.02.19 (M. Cotter) [BCVA: OD: 20/50++ OS: 20/30++] Ocular Hx-hypermetropia, DES, keratocojunctivitis, pseudo OU, YAG OU, iridectomy PMH-DM (takes Humalog/Metformin), HLD, HTN    CURRENT MEDICATIONS: Current Outpatient Medications (Ophthalmic Drugs)  Medication Sig  . prednisoLONE acetate (PRED FORTE) 1 % ophthalmic suspension Place 1 drop into both eyes 3 (three) times daily.   No current facility-administered medications for this visit.  (Ophthalmic Drugs)   Current Outpatient Medications (Other)  Medication Sig  . ACCU-CHEK AVIVA PLUS test strip USE 1 STRIP TO CHECK GLUCOSE 4 TIMES DAILY  . ACCU-CHEK SOFTCLIX LANCETS lancets Use bid  . amLODipine (NORVASC) 5 MG tablet   . aspirin EC 81 MG tablet Take 81 mg by mouth daily.  Marland Kitchen atorvastatin (LIPITOR) 10 MG tablet Take 1 tablet (10 mg total) by mouth  daily.  . bisacodyl (DULCOLAX) 5 MG EC tablet Take 5 mg by mouth daily.  . Blood Glucose Monitoring Suppl (ACCU-CHEK AVIVA PLUS) w/Device KIT Use to check blood sugar 3-4 times per day.  . cetirizine (ZYRTEC) 10 MG tablet Take 10 mg by mouth daily as needed for allergies.   . Cholecalciferol (VITAMIN D3) 5000 UNITS CAPS Take 5,000 Units by mouth.  Marland Kitchen glucose blood (ACCU-CHEK GUIDE) test strip 1 each by Other route 2 (two) times daily. Use as instructed  . losartan-hydrochlorothiazide (HYZAAR) 100-25 MG per tablet Take 1 tablet by mouth at bedtime.   . metFORMIN (GLUCOPHAGE) 1000 MG tablet Take 1 tablet in the morning and 1 tablets in the evening  . niacin (SLO-NIACIN) 500 MG tablet Take 500 mg by mouth at bedtime.  Marland Kitchen NOVOLIN N RELION 100 UNIT/ML injection INJECT 8 UNITS SUBCUTANEOUSLY IN THE MORNING AND 12 UNITS IN THE EVENING  . NOVOLIN R RELION 100 UNIT/ML injection  INJECT 8 UNITS SUBCUTANEOUSLY WITH BREAKFAST AND LUNCH, AND 16 UNITS WITH DINNER  . omeprazole (PRILOSEC) 40 MG capsule Take 40 mg by mouth daily.   No current facility-administered medications for this visit.  (Other)      REVIEW OF SYSTEMS: ROS    Positive for: Musculoskeletal, Endocrine, Eyes   Negative for: Constitutional, Gastrointestinal, Neurological, Skin, Genitourinary, HENT, Cardiovascular, Respiratory, Psychiatric, Allergic/Imm, Heme/Lymph   Last edited by Doneen Poisson  on 11/05/2018  8:57 AM. (History)       ALLERGIES No Known Allergies  PAST MEDICAL HISTORY Past Medical History:  Diagnosis Date  . Chronic kidney disease    kidney stone  . Diabetes mellitus without complication ([place])   . Hypertension   . Stroke [facility])    Past Surgical History:  Procedure Laterality Date  . BREAST BIOPSY Left   . CATARACT EXTRACTION Bilateral 2018   Dr. Tommy Rainwater  . HAND SURGERY    . NO PAST SURGERIES      FAMILY HISTORY History reviewed. No pertinent family history.  SOCIAL HISTORY Social History   Tobacco  Use  . Smoking status: Former Research scientist (life sciences)  . Smokeless tobacco: Never Used  Substance Use Topics  . Alcohol use: No  . Drug use: No         OPHTHALMIC EXAM:  Base Eye Exam    Visual Acuity (Snellen - Linear)      Right Left   Dist cc 20/20 -2 20/30 -1   Dist ph cc  20/25 -1       Tonometry (Tonopen, 9:03 AM)      Right Left   Pressure 14 14       Pupils      Dark Light Shape React APD   Right 3 2 Round Brisk 0   Left 3 2 Round Brisk 0       Visual Fields      Left Right    Full Full       Extraocular Movement      Right Left    Full Full       Neuro/Psych    Oriented x3: Yes   Mood/Affect: Normal       Dilation    Both eyes: 1.0% Mydriacyl, 2.5% Phenylephrine @ 9:03 AM        Slit Lamp and Fundus Exam    Slit Lamp Exam      Right Left   Lids/Lashes Dermatochalasis - upper lid Dermatochalasis - upper lid   Conjunctiva/Sclera nasal and temporal Pinguecula, Melanosis nasal and temporal Pinguecula, Melanosis   Cornea Arcus, 1+ Punctate epithelial erosions, Well healed temporal cataract wounds Arcus, 1+ Punctate epithelial erosions, Well healed temporal cataract wounds   Anterior Chamber deep, Narrow angles improved moderate depth   Iris Round and moderately dilated, focal iris atrophy at 1100  --  PI not open Round and moderately dilated, small patent PI at 0200   Lens PC IOL in good position with open PC, mild elschnig pearls superiorly/inferiorly PC IOL in good position with open PC   Vitreous Vitreous syneresis Vitreous syneresis       Fundus Exam      Right Left   Disc Pink and Sharp Pink and Sharp   C/D Ratio 0.2 0.3   Macula Flat, Blunted foveal reflex, rare Microaneurysms, trace Cystic changes, cluster of drusen ST to fovea Flat, Blunted foveal reflex, scattered Microaneurysms   Vessels mild Vascular attenuation, AV crossing changes mild Vascular attenuation, AV crossing changes   Periphery Attached, rare MA Attached, scattered MA         Refraction    Wearing Rx      Sphere Cylinder Axis Add   Right -2.25 +2.25 180 +2.50   Left -1.00 +1.50 175 +2.50   Age: 84 mo   Type: prog       Manifest Refraction      Sphere Cylinder Axis Dist VA   Right -2.25 +2.25  180 20/20-2   Left -1.50 +1.50 175 20/25          IMAGING AND PROCEDURES  Imaging and Procedures for '@TODAY' @  OCT, Retina - OU - Both Eyes       Right Eye Quality was good. Central Foveal Thickness: 259. Progression has improved. Findings include normal foveal contour, intraretinal fluid, no SRF (Trace perifoveal cystic changes superior fovea).   Left Eye Quality was good. Central Foveal Thickness: 240. Progression has improved. Findings include normal foveal contour, no SRF, intraretinal hyper-reflective material, no IRF (Interval improvement in cystic changes and IRHM).   Notes *Images captured and stored on drive  Diagnosis / Impression:  OD: Trace perifoveal cystic changes superior fovea OS: Interval improvement in cystic changes and IRHM   Clinical management:  See below  Abbreviations: NFP - Normal foveal profile. CME - cystoid macular edema. PED - pigment epithelial detachment. IRF - intraretinal fluid. SRF - subretinal fluid. EZ - ellipsoid zone. ERM - epiretinal membrane. ORA - outer retinal atrophy. ORT - outer retinal tubulation. SRHM - subretinal hyper-reflective material                 ASSESSMENT/PLAN:    ICD-10-CM   1. Moderate nonproliferative diabetic retinopathy of both eyes without macular edema associated with type 2 diabetes mellitus ([place])  G31.5176   2. Retinal edema  H35.81 OCT, Retina - OU - Both Eyes  3. Essential hypertension  I10   4. Hypertensive retinopathy of both eyes  H35.033   5. Pseudophakia of both eyes  Z96.1   6. Anatomical narrow angle  H40.039     1,2. Moderate nonproliferative diabetic retinopathy w/o DME, OU  - exam with scattered MA OU  - BCVA good, stable at 20/20 OD, 20/25 OS  - OCT without  frank diabetic macular edema, but tr cystic changes -- improved from prior  - discussed findings, prognosis and treatment options  - will monitor for now -- pt in agreement  - f/u in 6-9 mos -- DFE/OCT  3,4. Hypertensive retinopathy OU  - discussed importance of tight BP control  - monitor  5. Pseudophakia OU  - s/p CE/IOL OU  - beautiful surgeries by Dr. Talbert Forest, doing well  - monitor  6. Anatomical Narrow Angles OU  - s/p LPI OU by Dr. Talbert Forest  - OS patent LPI and open angle  - OD w/ closed PI  - will refer back to Dr. Talbert Forest for evaluation and management    Ophthalmic Meds Ordered this visit:  No orders of the defined types were placed in this encounter.      Return for 6-9 mos, Dilated Exam, OCT (f/u mild/mod NPDR OU).  There are no Patient Instructions on file for this visit.   Explained the diagnoses, plan, and follow up with the patient and they expressed understanding.  Patient expressed understanding of the importance of proper follow up care.   This document serves as a record of services personally performed by Gardiner Sleeper, MD, PhD. It was created on their behalf by Ernest Mallick, OA, an ophthalmic assistant. The creation of this record is the provider's dictation and/or activities during the visit.    Electronically signed by: Ernest Mallick, OA  09.07.2020 1:00 PM    Gardiner Sleeper, M.D., Ph.D. Diseases & Surgery of the Retina and Vitreous [facility]  I have reviewed the above documentation for accuracy and completeness, and I agree with the above. Gardiner Sleeper, M.D.,  Ph.D. 11/05/18 1:00 PM   Abbreviations: M myopia (nearsighted); A astigmatism; H hyperopia (farsighted); P presbyopia; Mrx spectacle prescription;  CTL contact lenses; OD right eye; OS left eye; OU both eyes  XT exotropia; ET esotropia; PEK punctate epithelial keratitis; PEE punctate epithelial erosions; DES dry eye syndrome; MGD meibomian gland dysfunction; ATs  artificial tears; PFAT's preservative free artificial tears; [place] nuclear sclerotic cataract; PSC posterior subcapsular cataract; ERM epi-retinal membrane; PVD posterior vitreous detachment; RD retinal detachment; DM diabetes mellitus; DR diabetic retinopathy; NPDR non-proliferative diabetic retinopathy; PDR proliferative diabetic retinopathy; CSME clinically significant macular edema; DME diabetic macular edema; dbh dot blot hemorrhages; CWS cotton wool spot; POAG primary open angle glaucoma; C/D cup-to-disc ratio; HVF humphrey visual field; GVF goldmann visual field; OCT optical coherence tomography; IOP intraocular pressure; BRVO Branch retinal vein occlusion; CRVO central retinal vein occlusion; CRAO central retinal artery occlusion; BRAO branch retinal artery occlusion; RT retinal tear; SB scleral buckle; PPV pars plana vitrectomy; VH Vitreous hemorrhage; PRP panretinal laser photocoagulation; IVK intravitreal kenalog; VMT vitreomacular traction; MH Macular hole;  NVD neovascularization of the disc; NVE neovascularization elsewhere; AREDS age related eye disease study; ARMD age related macular degeneration; POAG primary open angle glaucoma; EBMD epithelial/anterior basement membrane dystrophy; ACIOL anterior chamber intraocular lens; IOL intraocular lens; PCIOL posterior chamber intraocular lens; Phaco/IOL phacoemulsification with intraocular lens placement; [place] photorefractive keratectomy; LASIK laser assisted in situ keratomileusis; HTN hypertension; DM diabetes mellitus; COPD chronic obstructive pulmonary disease

## 2018-11-05 ENCOUNTER — Other Ambulatory Visit: Payer: Self-pay

## 2018-11-05 ENCOUNTER — Ambulatory Visit (INDEPENDENT_AMBULATORY_CARE_PROVIDER_SITE_OTHER): Payer: Medicare HMO | Admitting: Ophthalmology

## 2018-11-05 ENCOUNTER — Encounter (INDEPENDENT_AMBULATORY_CARE_PROVIDER_SITE_OTHER): Payer: Self-pay | Admitting: Ophthalmology

## 2018-11-05 DIAGNOSIS — E113393 Type 2 diabetes mellitus with moderate nonproliferative diabetic retinopathy without macular edema, bilateral: Secondary | ICD-10-CM | POA: Diagnosis not present

## 2018-11-05 DIAGNOSIS — Z961 Presence of intraocular lens: Secondary | ICD-10-CM

## 2018-11-05 DIAGNOSIS — H3581 Retinal edema: Secondary | ICD-10-CM

## 2018-11-05 DIAGNOSIS — I10 Essential (primary) hypertension: Secondary | ICD-10-CM

## 2018-11-05 DIAGNOSIS — H40039 Anatomical narrow angle, unspecified eye: Secondary | ICD-10-CM | POA: Diagnosis not present

## 2018-11-05 DIAGNOSIS — H35033 Hypertensive retinopathy, bilateral: Secondary | ICD-10-CM | POA: Diagnosis not present

## 2018-11-05 LAB — HM DIABETES EYE EXAM

## 2018-11-06 ENCOUNTER — Ambulatory Visit (HOSPITAL_COMMUNITY): Payer: Medicare HMO

## 2018-11-06 ENCOUNTER — Encounter (HOSPITAL_COMMUNITY): Payer: Self-pay

## 2018-11-06 DIAGNOSIS — M6281 Muscle weakness (generalized): Secondary | ICD-10-CM

## 2018-11-06 DIAGNOSIS — M546 Pain in thoracic spine: Secondary | ICD-10-CM

## 2018-11-06 NOTE — Therapy (Addendum)
[place], [place], [address] Phone: [phone]   Fax:  [phone]  Physical Therapy Treatment  Patient Details  Name: Sandra Washington MRN: [id number] Date of Birth: 02-Jan-1941 Referring Provider (PT): Lucianne Lei, MD   Encounter Date: 11/06/2018  PT End of Session - 11/06/18 0940    Visit Number  5    Number of Visits  8    Date for PT Re-Evaluation  11/22/18    Authorization Type  [employer]   no visit limit, $40 copay   Authorization Time Period  10/22/18 to 11/22/18    Authorization - Visit Number  5    Authorization - Number of Visits  10    PT Start Time  0945    PT Stop Time  1025    PT Time Calculation (min)  40 min    Activity Tolerance  Patient tolerated treatment well    Behavior During Therapy  [facility] for tasks assessed/performed       Past Medical History:  Diagnosis Date  . Chronic kidney disease    kidney stone  . Diabetes mellitus without complication ([place])   . Hypertension   . Stroke [facility])     Past Surgical History:  Procedure Laterality Date  . BREAST BIOPSY Left   . CATARACT EXTRACTION Bilateral 2018   Dr. Tommy Rainwater  . HAND SURGERY    . NO PAST SURGERIES      There were no vitals filed for this visit.  Subjective Assessment - 11/06/18 0939    Subjective  Pt states she fell last Tuesday while taking trash out so she is still sore.    Pertinent History  stroke (40+ years ago without residual effects), type II diabetes, Chronic kidney disease    Limitations  Sitting;Standing;House hold activities    How long can you sit comfortably?  15-20 minutes    How long can you stand comfortably?  15-20 minutes    How long can you walk comfortably?  15-20 minutes    Diagnostic tests  X-ray negative for thoracic spine    Patient Stated Goals  to get this knot out of my back    Currently in Pain?  Yes    Pain Score  3     Pain Location  Back    Pain Orientation  Right;Mid    Pain  Descriptors / Indicators  Aching;Sore;Spasm    Pain Type  Acute pain    Pain Onset  More than a month ago    Pain Frequency  Intermittent    Aggravating Factors   standing, walking, household chores    Pain Relieving Factors  sitting with straight cushioned back, tylenol, ice and heat    Effect of Pain on Daily Activities  increased          [facility] Adult PT Treatment/Exercise - 11/06/18 0001      Neck Exercises: Machines for Strengthening   UBE (Upper Arm Bike)  Level 3, backwards for posture, 4 minutes      Neck Exercises: Standing   Lift / Chop  10 reps    Theraband Level (Lift/Chop)  Level 3 (Green)    Left / Chop Limitations  chops, both directions    Other Standing Exercises  Y lift off, 10 reps with 3 sec hold    Other Standing Exercises  rows, GTB, 10 reps; shoulder extension, GTB, 10 reps      Neck Exercises: Prone   Shoulder  Extension  10 reps    Other Prone Exercise  Prone T's, Y's, 10 reps each    Other Prone Exercise  forearm scapular retraction pushups, x10 reps      Manual Therapy   Manual Therapy  Soft tissue mobilization    Manual therapy comments  completed separate from rest of treatment interventions    Soft tissue mobilization  pt prone,       Neck Exercises: Stretches   Corner Stretch  3 reps;30 seconds    Other Neck Stretches   Seated R/L lumbar flex/lats stretch with large physioball 10x10" fwd/R/L             PT Education - 11/06/18 0940    Education Details  Exercise technique, continue HEP    Person(s) Educated  Patient    Methods  Explanation    Comprehension  Verbalized understanding       PT Short Term Goals - 10/24/18 1158      PT SHORT TERM GOAL #1   Title  Pt will be indpendent with HEP, perform consistently and update PRN.    Time  2    Period  Weeks    Status  On-going    Target Date  11/05/18      PT SHORT TERM GOAL #2   Title  Pt will perform household chores for 30 minutes with 5/10 mid back pain at worst to improve  QoL.    Time  2    Period  Weeks    Status  On-going        PT Long Term Goals - 10/24/18 1158      PT LONG TERM GOAL #1   Title  Pt will perform household chores for 60 minutes with 2/10 mid back pain at worst to improve QoL.    Time  4    Period  Weeks    Status  On-going      PT LONG TERM GOAL #2   Title  Pt will improve BLE strength by 1 grade to improve ability to perform functional tasks without pain.    Time  4    Period  Weeks    Status  On-going      PT LONG TERM GOAL #3   Title  Pt will improve thoracic AROM to WNL to improve ability to perform household chores and recreational activities.    Time  4    Period  Weeks    Status  On-going      PT LONG TERM GOAL #4   Title  Pt will report no sleep disturbances due to mid back pain.    Time  4    Period  Weeks    Status  On-going      PT LONG TERM GOAL #5   Title  Pt will report no limitations with prolonged sitting or standing to demo reduction in palpable restrictions and improvement in pain throughout R thoracic paraspinals.    Time  4    Period  Weeks    Status  On-going            Plan - 11/06/18 0941    Clinical Impression Statement  Pt flared up from recent fall last Tuesday, but motivated to improve functional status. Initiated treatment session with stretching over physioball to reduce soreness and pain prior to strengthening. Added postural strengthening exercises requiring intermittent cues for form. Pt with increased difficulty performing prone T's and Y's requiring therapeutic rest break after sets  to recover. Added scapular protraction/retraction forearm pushups this date for added strengthening and stretching. Pt able to maintain UBE speed of 4.5-5 with mild shortness of breath for posture and endurance training. Pt tolerated increase in exercises and increase in resistance this date. Ended with STM to bil thoracic paraspinals, R>L to reduce pain and soreness. Pt reports improvement in pain at EOS.  Continue to progress as able.    Personal Factors and Comorbidities  Age;Comorbidity 1    Comorbidities  HTN    Examination-Activity Limitations  Carry;Lift;Reach Overhead;Sleep;Stand;Locomotion Level    Examination-Participation Restrictions  Cleaning;Driving    Stability/Clinical Decision Making  Stable/Uncomplicated    Rehab Potential  Good    PT Frequency  2x / week    PT Duration  4 weeks    PT Treatment/Interventions  ADLs/Self Care Home Management;Aquatic Therapy;Biofeedback;Cryotherapy;Electrical Stimulation;Iontophoresis 4mg /ml Dexamethasone;Moist Heat;Traction;Ultrasound;DME Instruction;Gait training;Stair training;Functional mobility training;Therapeutic activities;Therapeutic exercise;Balance training;Neuromuscular re-education;Patient/family education;Orthotic Fit/Training;Manual techniques;Passive range of motion;Dry needling;Taping;Joint Manipulations    PT Next Visit Plan  Teach tennis ball self STM on wall. Continue to progress postural strength strengthening and thoracic stretches. Continue forward flexion over physioball, consider cat/cow depending on pt tolerance. Continue manual PRN for pain releief and to reduce muscle spasm.    PT Home Exercise Plan  Eval: seated thoracic rotation, STS    Consulted and Agree with Plan of Care  Patient       Patient will benefit from skilled therapeutic intervention in order to improve the following deficits and impairments:  Decreased balance, Decreased range of motion, Decreased strength, Difficulty walking, Increased fascial restricitons, Increased muscle spasms, Impaired perceived functional ability, Impaired flexibility, Improper body mechanics, Pain  Visit Diagnosis: Pain in thoracic spine  Muscle weakness (generalized)     Problem List Patient Active Problem List   Diagnosis Date Noted  . Hypertension, essential 10/30/2014  . Other and unspecified hyperlipidemia 11/06/2012  . Unspecified essential hypertension 11/06/2012   . Type II or unspecified type diabetes mellitus without mention of complication, uncontrolled 10/23/2012     Talbot Grumbling PT, DPT 11/06/18, 10:28 AM [place], [place], [address] Phone: [phone]   Fax:  [phone]  Name: Sandra Washington MRN: [id number] Date of Birth: 1940/11/02

## 2018-11-08 ENCOUNTER — Other Ambulatory Visit: Payer: Self-pay

## 2018-11-08 ENCOUNTER — Ambulatory Visit (HOSPITAL_COMMUNITY): Payer: Medicare HMO

## 2018-11-08 ENCOUNTER — Encounter (HOSPITAL_COMMUNITY): Payer: Self-pay

## 2018-11-08 DIAGNOSIS — M546 Pain in thoracic spine: Secondary | ICD-10-CM

## 2018-11-08 DIAGNOSIS — M6281 Muscle weakness (generalized): Secondary | ICD-10-CM | POA: Diagnosis not present

## 2018-11-08 NOTE — Therapy (Signed)
[place], [place], [address] Phone: [phone]   Fax:  [phone]  Physical Therapy Treatment  Patient Details  Name: Sandra Washington MRN: [id number] Date of Birth: [birth year] Referring Provider (PT): Lucianne Lei, MD   Encounter Date: 11/08/2018  PT End of Session - 11/08/18 1441    Visit Number  6    Number of Visits  8    Date for PT Re-Evaluation  11/22/18    Authorization Type  [employer]   no visit limit, $40 copay   Authorization Time Period  10/22/18 to 11/22/18    Authorization - Visit Number  6    Authorization - Number of Visits  10    PT Start Time  [id number]    PT Stop Time  1513    PT Time Calculation (min)  42 min    Activity Tolerance  Patient tolerated treatment well    Behavior During Therapy  [facility] for tasks assessed/performed       Past Medical History:  Diagnosis Date  . Chronic kidney disease    kidney stone  . Diabetes mellitus without complication ([place])   . Hypertension   . Stroke [facility])     Past Surgical History:  Procedure Laterality Date  . BREAST BIOPSY Left   . CATARACT EXTRACTION Bilateral 2018   Dr. Tommy Rainwater  . HAND SURGERY    . NO PAST SURGERIES      There were no vitals filed for this visit.  Subjective Assessment - 11/08/18 1432    Subjective  Pt reports not much pain. Pt reports using TENS machine yesterday due to soreness from prone exercises. Pt reports using lemon for STM to back.    Pertinent History  stroke (40+ years ago without residual effects), type II diabetes, Chronic kidney disease    Limitations  Sitting;Standing;House hold activities    How long can you sit comfortably?  15-20 minutes    How long can you stand comfortably?  15-20 minutes    How long can you walk comfortably?  15-20 minutes    Diagnostic tests  X-ray negative for thoracic spine    Patient Stated Goals  to get this knot out of my back    Currently in Pain?  Yes    Pain Score   1     Pain Location  Back    Pain Orientation  Right;Mid    Pain Type  Acute pain    Pain Onset  More than a month ago    Pain Frequency  Intermittent    Aggravating Factors   standing, walking, household chores    Pain Relieving Factors  sitting with straight cushioned back, tylenol, ice and heat    Effect of Pain on Daily Activities  increased             [facility] Adult PT Treatment/Exercise - 11/08/18 0001      Neck Exercises: Machines for Strengthening   UBE (Upper Arm Bike)  Level 3, backwards for posture, 4 minutes      Neck Exercises: Standing   Lift / Chop  15 reps    Theraband Level (Lift/Chop)  Level 4 (Blue)    Left / Chop Limitations  chops, both directions    Other Standing Exercises  rows, BTB, 15 reps; shoulder extension, BTB, 15 reps      Manual Therapy   Manual Therapy  Soft tissue mobilization;Joint mobilization    Manual therapy comments  completed separate from rest of treatment interventions    Joint Mobilization  gentle PAs for pain reduction and to decrease muscle tension, T4-12    Soft tissue mobilization  Pt prone, R thoracic paraspinals, rhomboid/mid trap region to reduce increased muscle tension      Neck Exercises: Stretches   Other Neck Stretches   Seated R/L lumbar flex/lats stretch with large physioball 10x10" fwd/R/L    Other Neck Stretches  seated thoracic rotation with bolster between knees and dowel at chest, 10x10 sec holds             PT Education - 11/08/18 1440    Education Details  Exercise technique, updated HEP, tennis ball self massage    Person(s) Educated  Patient    Methods  Explanation;Demonstration;Handout    Comprehension  Verbalized understanding;Returned demonstration       PT Short Term Goals - 10/24/18 1158      PT SHORT TERM GOAL #1   Title  Pt will be indpendent with HEP, perform consistently and update PRN.    Time  2    Period  Weeks    Status  On-going    Target Date  11/05/18      PT SHORT TERM GOAL  #2   Title  Pt will perform household chores for 30 minutes with 5/10 mid back pain at worst to improve QoL.    Time  2    Period  Weeks    Status  On-going        PT Long Term Goals - 10/24/18 1158      PT LONG TERM GOAL #1   Title  Pt will perform household chores for 60 minutes with 2/10 mid back pain at worst to improve QoL.    Time  4    Period  Weeks    Status  On-going      PT LONG TERM GOAL #2   Title  Pt will improve BLE strength by 1 grade to improve ability to perform functional tasks without pain.    Time  4    Period  Weeks    Status  On-going      PT LONG TERM GOAL #3   Title  Pt will improve thoracic AROM to WNL to improve ability to perform household chores and recreational activities.    Time  4    Period  Weeks    Status  On-going      PT LONG TERM GOAL #4   Title  Pt will report no sleep disturbances due to mid back pain.    Time  4    Period  Weeks    Status  On-going      PT LONG TERM GOAL #5   Title  Pt will report no limitations with prolonged sitting or standing to demo reduction in palpable restrictions and improvement in pain throughout R thoracic paraspinals.    Time  4    Period  Weeks    Status  On-going            Plan - 11/08/18 1441    Clinical Impression Statement  Continued with postural strengthening and ROM exercises this date. Pt able to maintain 13.5 speed on UBE this date on level 3 demonstrating improved endurance and strength, occasionally dipping to 11, but able to increase with verbal cues. Continued with stretching over physioball for flexion mobility and lat stretch and added thoracic rotation with dowel this date with positive results. Pt  tolerates increase in resistance with postural strengthening exercises this date and issued them to HEP this date with GTB and door hanger. Avoided prone exercises this date due to increased low back pain after last session.  Ended with STM to R thoracic paraspinals and gentle PAs for  pain reduction and due to increased muscle tension still noted. Pt reports improvement in pain at EOS. Continue to progress as able.    Personal Factors and Comorbidities  Age;Comorbidity 1    Comorbidities  HTN    Examination-Activity Limitations  Carry;Lift;Reach Overhead;Sleep;Stand;Locomotion Level    Examination-Participation Restrictions  Cleaning;Driving    Stability/Clinical Decision Making  Stable/Uncomplicated    Rehab Potential  Good    PT Frequency  2x / week    PT Duration  4 weeks    PT Treatment/Interventions  ADLs/Self Care Home Management;Aquatic Therapy;Biofeedback;Cryotherapy;Electrical Stimulation;Iontophoresis 4mg /ml Dexamethasone;Moist Heat;Traction;Ultrasound;DME Instruction;Gait training;Stair training;Functional mobility training;Therapeutic activities;Therapeutic exercise;Balance training;Neuromuscular re-education;Patient/family education;Orthotic Fit/Training;Manual techniques;Passive range of motion;Dry needling;Taping;Joint Manipulations    PT Next Visit Plan  Finalize HEP and prepare for d/c over last visits. Continue to progress postural strength strengthening and thoracic stretches. Continue forward stretching/thoracic mobility exercises. Continue manual PRN for pain releief and to reduce muscle spasm.    PT Home Exercise Plan  Eval: seated thoracic rotation, STS; 9/11: rows, shoulder extension/lat pull down, chops with GTB    Consulted and Agree with Plan of Care  Patient       Patient will benefit from skilled therapeutic intervention in order to improve the following deficits and impairments:  Decreased balance, Decreased range of motion, Decreased strength, Difficulty walking, Increased fascial restricitons, Increased muscle spasms, Impaired perceived functional ability, Impaired flexibility, Improper body mechanics, Pain  Visit Diagnosis: Pain in thoracic spine  Muscle weakness (generalized)     Problem List Patient Active Problem List   Diagnosis  Date Noted  . Hypertension, essential 10/30/2014  . Other and unspecified hyperlipidemia 11/06/2012  . Unspecified essential hypertension 11/06/2012  . Type II or unspecified type diabetes mellitus without mention of complication, uncontrolled 10/23/2012     Talbot Grumbling PT, DPT 11/08/18, 3:18 PM [place], [place], [address] Phone: [phone]   Fax:  [phone]  Name: Sandra Washington MRN: [id number] Date of Birth: Mar 21, 1940

## 2018-11-11 DIAGNOSIS — E1169 Type 2 diabetes mellitus with other specified complication: Secondary | ICD-10-CM | POA: Diagnosis not present

## 2018-11-11 DIAGNOSIS — M546 Pain in thoracic spine: Secondary | ICD-10-CM | POA: Diagnosis not present

## 2018-11-11 DIAGNOSIS — I10 Essential (primary) hypertension: Secondary | ICD-10-CM | POA: Diagnosis not present

## 2018-11-12 ENCOUNTER — Other Ambulatory Visit: Payer: Self-pay

## 2018-11-12 ENCOUNTER — Ambulatory Visit (HOSPITAL_COMMUNITY): Payer: Medicare HMO | Admitting: Physical Therapy

## 2018-11-12 DIAGNOSIS — M546 Pain in thoracic spine: Secondary | ICD-10-CM

## 2018-11-12 DIAGNOSIS — M6281 Muscle weakness (generalized): Secondary | ICD-10-CM | POA: Diagnosis not present

## 2018-11-12 NOTE — Therapy (Signed)
[place], [place], [address] Phone: [phone]   Fax:  [phone]  Physical Therapy Treatment  Patient Details  Name: Sandra Washington MRN: [id number] Date of Birth: 1940/10/16 Referring Provider (PT): Lucianne Lei, MD   Encounter Date: 11/12/2018  PT End of Session - 11/12/18 1207    Visit Number  7    Number of Visits  8    Date for PT Re-Evaluation  11/22/18    Authorization Type  [employer]   no visit limit, $40 copay   Authorization Time Period  10/22/18 to 11/22/18    Authorization - Visit Number  7    Authorization - Number of Visits  10    PT Start Time  1132    PT Stop Time  1210    PT Time Calculation (min)  38 min    Activity Tolerance  Patient tolerated treatment well    Behavior During Therapy  [facility] for tasks assessed/performed       Past Medical History:  Diagnosis Date  . Chronic kidney disease    kidney stone  . Diabetes mellitus without complication ([place])   . Hypertension   . Stroke [facility])     Past Surgical History:  Procedure Laterality Date  . BREAST BIOPSY Left   . CATARACT EXTRACTION Bilateral 2018   Dr. Tommy Rainwater  . HAND SURGERY    . NO PAST SURGERIES      There were no vitals filed for this visit.  Subjective Assessment - 11/12/18 1137    Subjective  pt states she conitues to do well.  Only 1/10 pain today.    Currently in Pain?  Yes    Pain Score  1     Pain Location  Back    Pain Orientation  Right;Mid                       [facility] Adult PT Treatment/Exercise - 11/12/18 0001      Neck Exercises: Machines for Strengthening   UBE (Upper Arm Bike)  Level 3, backwards for posture, 4 minutes      Neck Exercises: Standing   Other Standing Exercises  rows, BTB, 15 reps; shoulder extension, BTB, 15 reps      Neck Exercises: Prone   Shoulder Extension  10 reps    Rows  10 reps    Other Prone Exercise  Prone T to Y, 10 reps each                PT Short Term Goals - 10/24/18 1158      PT SHORT TERM GOAL #1   Title  Pt will be indpendent with HEP, perform consistently and update PRN.    Time  2    Period  Weeks    Status  On-going    Target Date  11/05/18      PT SHORT TERM GOAL #2   Title  Pt will perform household chores for 30 minutes with 5/10 mid back pain at worst to improve QoL.    Time  2    Period  Weeks    Status  On-going        PT Long Term Goals - 10/24/18 1158      PT LONG TERM GOAL #1   Title  Pt will perform household chores for 60 minutes with 2/10 mid back pain at worst to improve QoL.    Time  4  Period  Weeks    Status  On-going      PT LONG TERM GOAL #2   Title  Pt will improve BLE strength by 1 grade to improve ability to perform functional tasks without pain.    Time  4    Period  Weeks    Status  On-going      PT LONG TERM GOAL #3   Title  Pt will improve thoracic AROM to WNL to improve ability to perform household chores and recreational activities.    Time  4    Period  Weeks    Status  On-going      PT LONG TERM GOAL #4   Title  Pt will report no sleep disturbances due to mid back pain.    Time  4    Period  Weeks    Status  On-going      PT LONG TERM GOAL #5   Title  Pt will report no limitations with prolonged sitting or standing to demo reduction in palpable restrictions and improvement in pain throughout R thoracic paraspinals.    Time  4    Period  Weeks    Status  On-going            Plan - 11/12/18 1211    Clinical Impression Statement  pt doing well overall, needing minimal cues to complete therex in correct form.  Plans to stop at [facility] and see about gettin back into the pool for exercise and getting regular massages.  No questions regarding her HEP and general activity.   Pt given tennis ball to use for trigger pont massage at home.  Continues to have large spasm at Rt UT region.    Personal Factors and Comorbidities  Age;Comorbidity 1     Comorbidities  HTN    Examination-Activity Limitations  Carry;Lift;Reach Overhead;Sleep;Stand;Locomotion Level    Examination-Participation Restrictions  Cleaning;Driving    Stability/Clinical Decision Making  Stable/Uncomplicated    Rehab Potential  Good    PT Frequency  2x / week    PT Duration  4 weeks    PT Treatment/Interventions  ADLs/Self Care Home Management;Aquatic Therapy;Biofeedback;Cryotherapy;Electrical Stimulation;Iontophoresis 4mg /ml Dexamethasone;Moist Heat;Traction;Ultrasound;DME Instruction;Gait training;Stair training;Functional mobility training;Therapeutic activities;Therapeutic exercise;Balance training;Neuromuscular re-education;Patient/family education;Orthotic Fit/Training;Manual techniques;Passive range of motion;Dry needling;Taping;Joint Manipulations    PT Next Visit Plan  re-evaluate next session with probable discharge.    PT Home Exercise Plan  Eval: seated thoracic rotation, STS; 9/11: rows, shoulder extension/lat pull down, chops with GTB    Consulted and Agree with Plan of Care  Patient       Patient will benefit from skilled therapeutic intervention in order to improve the following deficits and impairments:  Decreased balance, Decreased range of motion, Decreased strength, Difficulty walking, Increased fascial restricitons, Increased muscle spasms, Impaired perceived functional ability, Impaired flexibility, Improper body mechanics, Pain  Visit Diagnosis: Muscle weakness (generalized)  Pain in thoracic spine     Problem List Patient Active Problem List   Diagnosis Date Noted  . Hypertension, essential 10/30/2014  . Other and unspecified hyperlipidemia 11/06/2012  . Unspecified essential hypertension 11/06/2012  . Type II or unspecified type diabetes mellitus without mention of complication, uncontrolled 10/23/2012   Teena Irani, PTA/CLT [phone]  Teena Irani 11/12/2018, 12:14 PM  [place], [place], [address] Phone: [phone]   Fax:  [phone]  Name: Sandra Washington MRN: [id number] Date of Birth: [birth year]

## 2018-11-15 ENCOUNTER — Ambulatory Visit (HOSPITAL_COMMUNITY): Payer: Medicare HMO

## 2018-11-15 ENCOUNTER — Encounter (HOSPITAL_COMMUNITY): Payer: Self-pay

## 2018-11-15 ENCOUNTER — Other Ambulatory Visit: Payer: Self-pay

## 2018-11-15 DIAGNOSIS — M6281 Muscle weakness (generalized): Secondary | ICD-10-CM

## 2018-11-15 DIAGNOSIS — M546 Pain in thoracic spine: Secondary | ICD-10-CM | POA: Diagnosis not present

## 2018-11-15 NOTE — Therapy (Signed)
[place] [address] [place], [place], [address] Phone: [phone]   Fax:  [phone]   PHYSICAL THERAPY DISCHARGE SUMMARY  Visits from Start of Care: 8  Current functional level related to goals / functional outcomes: See below   Remaining deficits: See below   Education / Equipment: See below Plan: Patient agrees to discharge.  Patient goals were partially met. Patient is being discharged due to being pleased with the current functional level.  ?????       Physical Therapy Treatment  Patient Details  Name: Sandra Washington MRN: [id number] Date of Birth: 24-Dec-1940 Referring Provider (PT): Lucianne Lei, MD   Encounter Date: 11/15/2018  PT End of Session - 11/15/18 0953    Visit Number  8    Number of Visits  8    Date for PT Re-Evaluation  11/22/18    Authorization Type  [employer]   no visit limit, $40 copay   Authorization Time Period  10/22/18 to 11/22/18    Authorization - Visit Number  8    Authorization - Number of Visits  10    PT Start Time  0945    PT Stop Time  1015    PT Time Calculation (min)  30 min    Activity Tolerance  Patient tolerated treatment well    Behavior During Therapy  [facility] for tasks assessed/performed       Past Medical History:  Diagnosis Date  . Chronic kidney disease    kidney stone  . Diabetes mellitus without complication ([place])   . Hypertension   . Stroke [facility])     Past Surgical History:  Procedure Laterality Date  . BREAST BIOPSY Left   . CATARACT EXTRACTION Bilateral 2018   Dr. Tommy Rainwater  . HAND SURGERY    . NO PAST SURGERIES      There were no vitals filed for this visit.  Subjective Assessment - 11/15/18 0947    Subjective  Pt reports exercises on stomach hurts her back. Pt reports her pain today isn't bad, just about a 1, don't really feel it.    Pertinent History  stroke (40+ years ago without residual effects), type II diabetes, Chronic kidney disease     Limitations  Sitting;Standing;House hold activities    How long can you sit comfortably?  15-20 minutes    How long can you stand comfortably?  15-20 minutes    How long can you walk comfortably?  15-20 minutes    Diagnostic tests  X-ray negative for thoracic spine    Patient Stated Goals  to get this knot out of my back    Currently in Pain?  Yes    Pain Score  1     Pain Location  Back    Pain Orientation  Mid;Right    Pain Descriptors / Indicators  Aching    Pain Type  Acute pain    Pain Onset  More than a month ago    Pain Frequency  Intermittent    Aggravating Factors   standing, walking, household chores    Pain Relieving Factors  sitting with straight cushioned back, tylenol, ice and heat    Effect of Pain on Daily Activities  increased         [facility] PT Assessment - 11/15/18 0001      Assessment   Medical Diagnosis  Back Pain    Referring Provider (PT)  Lucianne Lei, MD    Onset Date/Surgical Date  --  about 6 weeks ago   Next MD Visit  Week of Sept 21    Prior Therapy  Yes, low back pain with + results      Observation/Other Assessments   Focus on Therapeutic Outcomes (FOTO)   45% limited   was 49%     Sensation   Light Touch  Appears Intact      Functional Tests   Functional tests  --      Sit to Stand   Comments  --      Posture/Postural Control   Posture/Postural Control  No significant limitations    Posture Comments  slightly decreased thoracic curve   unchanged     AROM   Overall AROM Comments  thoracic spine maintains flatened curve with all mobility    Lumbar Flexion  WNL   was WNL   Lumbar Extension  WNL   was WNL   Lumbar - Right Side Bend  jt line   was jt line   Lumbar - Left Side Bend  jt line   was jt line   Lumbar - Right Rotation  50%  limited   was 50% limited   Lumbar - Left Rotation  50% limited   was 50% limited   Thoracic Flexion  75% limited   was 75% limited; continues to maintain flat back   Thoracic Extension  75%  limited   was 75% limited; continues to maintain flat back   Thoracic - Right Side Bend  25% limited   was 50% limited   Thoracic - Left Side Bend  25% limited   was 50% limited   Thoracic - Right Rotation  50% limited   was 50% limited   Thoracic - Left Rotation  50% limited   was 50% limited     Strength   Right Hip Flexion  4/5   was 3+   Right Hip Extension  3+/5   was 3   Right Hip ABduction  3+/5   was 3+   Left Hip Flexion  4/5   was 4   Left Hip Extension  3+/5   was 3   Left Hip ABduction  4/5   was 4   Right Knee Flexion  4/5   was 4   Right Knee Extension  5/5   was 5   Left Knee Flexion  4/5   was 4   Left Knee Extension  5/5   was 5   Right Ankle Dorsiflexion  5/5   was 5   Left Ankle Dorsiflexion  5/5   was 5     Palpation   Spinal mobility  normal mobility throughout thoracic and lumbar spine   unchanged   Palpation comment  pt denies tenderness/pain with palpation   was increased pain, restrictions R paraspinals T7-12            PT Education - 11/15/18 0948    Education Details  Reassessment findings, d/c to HEP    Person(s) Educated  Patient    Methods  Explanation;Demonstration;Handout    Comprehension  Verbalized understanding       PT Short Term Goals - 11/15/18 0949      PT SHORT TERM GOAL #1   Title  Pt will be indpendent with HEP, perform consistently and update PRN.    Baseline  9/18: reports compliance    Time  2    Period  Weeks    Status  Achieved    Target Date  11/05/18      PT SHORT TERM GOAL #2   Title  Pt will perform household chores for 30 minutes with 5/10 mid back pain at worst to improve QoL.    Baseline  9/18: pt reports 30 minutes with 1/10 pain " i feel that knot"    Time  2    Period  Weeks    Status  Achieved        PT Long Term Goals - 11/15/18 0950      PT LONG TERM GOAL #1   Title  Pt will perform household chores for 60 minutes with 2/10 mid back pain at worst to improve QoL.    Baseline   9/18: pt reports 60 minutes with 1/10 "i just feel that knot, I just keep going"    Time  4    Period  Weeks    Status  Achieved      PT LONG TERM GOAL #2   Title  Pt will improve BLE strength by 1 grade to improve ability to perform functional tasks without pain.    Baseline  9/18: see MMT    Time  4    Period  Weeks    Status  Partially Met      PT LONG TERM GOAL #3   Title  Pt will improve thoracic AROM to WNL to improve ability to perform household chores and recreational activities.    Baseline  9/18: see ROM    Time  4    Period  Weeks    Status  Partially Met      PT LONG TERM GOAL #4   Title  Pt will report no sleep disturbances due to mid back pain.    Baseline  9/18: pt reports it doesn't wake her up, but if she does wake up to go to the bathroom she can feel it    Time  4    Period  Weeks    Status  Achieved      PT LONG TERM GOAL #5   Title  Pt will report no limitations with prolonged sitting or standing to demo reduction in palpable restrictions and improvement in pain throughout R thoracic paraspinals.    Baseline  9/18: pt reports standing/sitting still for long time, she can feel it so avoids prolonged positioning    Time  4    Period  Weeks    Status  Not Met            Plan - 11/15/18 1020    Clinical Impression Statement  Pt due for reassessment of goals and other objective measures this date due to being at end of POC. Subjectively, pt with improvements in pain, able to return to daily activities and using personal TENS unit for pain management when needed. Objectively, pt with minimal improvements in strength per MMT and AROM since initial evaluation. Pt is independent with HEP and confident that she can continue to perform consistently. Also, pt reports beginning at [facility] in coming weeks to further progress her activity level and return to normal exercise program since covid-19 shut down.  Pt is appropriate to d/c at this date due to being pleased with  functional status and able to continue progressing independently.    Personal Factors and Comorbidities  Age;Comorbidity 1    Comorbidities  HTN    Examination-Activity Limitations  Carry;Lift;Reach Overhead;Sleep;Stand;Locomotion Level    Examination-Participation Restrictions  Cleaning;Driving    Stability/Clinical Decision Making  Stable/Uncomplicated  Rehab Potential  Good    PT Frequency  2x / week    PT Duration  4 weeks    PT Treatment/Interventions  ADLs/Self Care Home Management;Aquatic Therapy;Biofeedback;Cryotherapy;Electrical Stimulation;Iontophoresis 52m/ml Dexamethasone;Moist Heat;Traction;Ultrasound;DME Instruction;Gait training;Stair training;Functional mobility training;Therapeutic activities;Therapeutic exercise;Balance training;Neuromuscular re-education;Patient/family education;Orthotic Fit/Training;Manual techniques;Passive range of motion;Dry needling;Taping;Joint Manipulations    PT Next Visit Plan  d/c to HEP and [employer] exercise    PT Home Exercise Plan  Eval: seated thoracic rotation, STS; 9/11: rows, shoulder extension/lat pull down, chops with GTB    Consulted and Agree with Plan of Care  Patient       Patient will benefit from skilled therapeutic intervention in order to improve the following deficits and impairments:  Decreased balance, Decreased range of motion, Decreased strength, Difficulty walking, Increased fascial restricitons, Increased muscle spasms, Impaired perceived functional ability, Impaired flexibility, Improper body mechanics, Pain  Visit Diagnosis: Pain in thoracic spine  Muscle weakness (generalized)     Problem List Patient Active Problem List   Diagnosis Date Noted  . Hypertension, essential 10/30/2014  . Other and unspecified hyperlipidemia 11/06/2012  . Unspecified essential hypertension 11/06/2012  . Type II or unspecified type diabetes mellitus without mention of complication, uncontrolled 10/23/2012     TTalbot GrumblingPT,  DPT 11/15/18, 10:25 AM [address] [address]Phone: [phone]  Fax:  [phone] Name: CWEN MUNFORDMRN: [id number] of Birth: [birth year]

## 2018-11-15 NOTE — Patient Instructions (Signed)
  Exercises Single Arm Low Trap Setting at [place] 10 reps - 3 sets - 1x daily - 7x weekly Wall [place] - 10 reps - 3 sets - 1x daily - 7x weekly Seated Thoracic Flexion and Rotation with Arms Crossed - 10 reps - 3 sets - 1x daily - 7x weekly Seated Thoracic Lumbar Extension - 10 reps - 3 sets - 1x daily - 7x weekly Seated Thoracic Flexion and Rotation with Swiss Ball - 10 reps - 3 sets - 10 seconds hold - 1x daily - 7x weekly

## 2018-11-18 DIAGNOSIS — Z961 Presence of intraocular lens: Secondary | ICD-10-CM | POA: Diagnosis not present

## 2018-11-18 DIAGNOSIS — E119 Type 2 diabetes mellitus without complications: Secondary | ICD-10-CM | POA: Diagnosis not present

## 2018-11-18 DIAGNOSIS — H18411 Arcus senilis, right eye: Secondary | ICD-10-CM | POA: Diagnosis not present

## 2018-11-18 DIAGNOSIS — H40013 Open angle with borderline findings, low risk, bilateral: Secondary | ICD-10-CM | POA: Diagnosis not present

## 2018-11-19 ENCOUNTER — Ambulatory Visit (HOSPITAL_COMMUNITY): Payer: Medicare HMO

## 2018-11-21 ENCOUNTER — Encounter (HOSPITAL_COMMUNITY): Payer: Medicare HMO | Admitting: Physical Therapy

## 2018-12-05 ENCOUNTER — Other Ambulatory Visit: Payer: Self-pay | Admitting: Gastroenterology

## 2018-12-05 ENCOUNTER — Other Ambulatory Visit (HOSPITAL_COMMUNITY): Payer: Self-pay | Admitting: Gastroenterology

## 2018-12-05 DIAGNOSIS — Z86010 Personal history of colonic polyps: Secondary | ICD-10-CM | POA: Diagnosis not present

## 2018-12-05 DIAGNOSIS — K219 Gastro-esophageal reflux disease without esophagitis: Secondary | ICD-10-CM | POA: Diagnosis not present

## 2018-12-05 DIAGNOSIS — R112 Nausea with vomiting, unspecified: Secondary | ICD-10-CM

## 2018-12-05 DIAGNOSIS — R110 Nausea: Secondary | ICD-10-CM | POA: Diagnosis not present

## 2018-12-12 ENCOUNTER — Other Ambulatory Visit: Payer: Self-pay

## 2018-12-12 ENCOUNTER — Ambulatory Visit (HOSPITAL_COMMUNITY)
Admission: RE | Admit: 2018-12-12 | Discharge: 2018-12-12 | Disposition: A | Discharge status: Home / Self Care | Payer: Medicare HMO | Source: Ambulatory Visit | Attending: Gastroenterology | Admitting: Gastroenterology

## 2018-12-12 DIAGNOSIS — K8020 Calculus of gallbladder without cholecystitis without obstruction: Secondary | ICD-10-CM | POA: Diagnosis not present

## 2018-12-12 DIAGNOSIS — R112 Nausea with vomiting, unspecified: Secondary | ICD-10-CM | POA: Insufficient documentation

## 2018-12-13 ENCOUNTER — Other Ambulatory Visit (INDEPENDENT_AMBULATORY_CARE_PROVIDER_SITE_OTHER): Payer: Medicare HMO

## 2018-12-13 ENCOUNTER — Other Ambulatory Visit: Payer: Self-pay

## 2018-12-13 DIAGNOSIS — E1165 Type 2 diabetes mellitus with hyperglycemia: Secondary | ICD-10-CM

## 2018-12-13 DIAGNOSIS — Z794 Long term (current) use of insulin: Secondary | ICD-10-CM | POA: Diagnosis not present

## 2018-12-13 LAB — LIPID PANEL
Cholesterol: 158 mg/dL (ref 0–200)
HDL: 39.7 mg/dL (ref >39.00)
LDL Cholesterol: 100 mg/dL — ABNORMAL HIGH (ref 0–99)
NonHDL: 118.44
Total CHOL/HDL Ratio: 4
Triglycerides: 93 mg/dL (ref 0.0–149.0)
VLDL: 18.6 mg/dL (ref 0.0–40.0)

## 2018-12-13 LAB — COMPREHENSIVE METABOLIC PANEL
ALT: 12 U/L (ref 0–35)
AST: 13 U/L (ref 0–37)
Albumin: 4.2 g/dL (ref 3.5–5.2)
Alkaline Phosphatase: 53 U/L (ref 39–117)
BUN: 13 mg/dL (ref 6–23)
CO2: 29 mEq/L (ref 19–32)
Calcium: 9.7 mg/dL (ref 8.4–10.5)
Chloride: 106 mEq/L (ref 96–112)
Creatinine, Ser: 0.71 mg/dL (ref 0.40–1.20)
GFR: 95.86 mL/min (ref >60.00)
Glucose, Bld: 161 mg/dL — ABNORMAL HIGH (ref 70–99)
Potassium: 4.6 mEq/L (ref 3.5–5.1)
Sodium: 141 mEq/L (ref 135–145)
Total Bilirubin: 0.8 mg/dL (ref 0.2–1.2)
Total Protein: 6.7 g/dL (ref 6.0–8.3)

## 2018-12-13 LAB — HEMOGLOBIN A1C: Hgb A1c MFr Bld: 6.7 % — ABNORMAL HIGH (ref 4.6–6.5)

## 2018-12-16 NOTE — Progress Notes (Signed)
Patient ID: Sandra Washington, female   DOB: November 10, 1940, 78 y.o.   MRN: [id number]   Reason for Appointment: Diabetes follow-up   History of Present Illness   Diagnosis: Type 2 DIABETES MELITUS, date of diagnosis:   1995       She has been on insulin almost since her diagnosis and has had various regimens over the years More recently has been on basal bolus insulin with Lantus and Humalog with variable control, depending on her compliance with diet and exercise She has generally been having difficulty with consistent compliance in the last few months because of taking care of her husband  Recent history:  Insulin regimen:  N insulin 8 units a.m.--12 at bedtime,  Novolin R 10-05-14 ac tid   Her A1c is surprisingly better at 6.7 compared to 6 7 and has gradually come down  She has not been seen in follow-up since 01/2018   Current blood sugar patterns, management and problems:  She says she is not checking her sugars regularly at various times because of not being able to go out to get her test strips  However given that she was doing readings after meals consistently she is only doing some readings fasting  These readings are excellent despite taking NPH insulin  Also no hypoglycemia compared to previous visit  She is not eating out and generally eating healthy meals  Her weight is down about 8 pounds  Although she is not going out to exercise she is trying to walk or do other activities indoors for exercise  She thinks she is taking her mealtime insulin 20 minutes before eating although not clear if she does this consistently at lunch and dinner  Insulin dose has not been changed for some time  Oral hypoglycemic drugs:  metformin  1g bid     Side effects from medications: None              Monitors blood glucose: Once a day or less .    Glucometer:  Accu-Chek  Blood Glucose readings:    PRE-MEAL Fasting Lunch Dinner Bedtime Overall  Glucose range:   84-177   99    Mean/median:  108    108   Previous data:  PRE-MEAL Fasting Lunch Dinner Bedtime Overall  Glucose range: ?      Mean/median:     103   POST-MEAL PC Breakfast PC Lunch PC Dinner  Glucose range:  64-113  57-173  52, 111  Mean/median:              Meals: 3 meals per day.  breakfast is: Cereal or egg; eating out usually having sandwiches. Lunch  12:30pm              Dietician visit: Most recent: 03/2008           Wt Readings from Last 3 Encounters:  12/17/18 186 lb 12.8 oz (84.7 kg)  02/05/18 194 lb 9.6 oz (88.3 kg)  05/01/17 193 lb 3.2 oz (87.6 kg)    Lab Results  Component Value Date   HGBA1C 6.7 (H) 12/13/2018   HGBA1C 7.0 (H) 02/01/2018   HGBA1C 7.4 (H) 04/26/2017   Lab Results  Component Value Date   MICROALBUR <0.7 02/01/2018   LDLCALC 100 (H) 12/13/2018   CREATININE 0.71 12/13/2018    OTHER problems discussed today: See review of systems   Lab on 12/13/2018  Component Date Value Ref Range Status  . Cholesterol 12/13/2018 158  0 -  200 mg/dL Final   ATP III Classification       Desirable:  < 200 mg/dL               Borderline High:  200 - 239 mg/dL          High:  > = 240 mg/dL  . Triglycerides 12/13/2018 93.0  0.0 - 149.0 mg/dL Final   Normal:  <150 mg/dLBorderline High:  150 - 199 mg/dL  . HDL 12/13/2018 39.70  >39.00 mg/dL Final  . VLDL 12/13/2018 18.6  0.0 - 40.0 mg/dL Final  . LDL Cholesterol 12/13/2018 100* 0 - 99 mg/dL Final  . Total CHOL/HDL Ratio 12/13/2018 4   Final                  Men          Women1/2 Average Risk     3.4          3.3Average Risk          5.0          4.42X Average Risk          9.6          7.13X Average Risk          15.0          11.0                      . NonHDL 12/13/2018 118.44   Final   NOTE:  Non-HDL goal should be 30 mg/dL higher than patient's LDL goal (i.e. LDL goal of < 70 mg/dL, would have non-HDL goal of < 100 mg/dL)  . Sodium 12/13/2018 141  135 - 145 mEq/L Final  . Potassium 12/13/2018 4.6  3.5 -  5.1 mEq/L Final  . Chloride 12/13/2018 106  96 - 112 mEq/L Final  . CO2 12/13/2018 29  19 - 32 mEq/L Final  . Glucose, Bld 12/13/2018 161* 70 - 99 mg/dL Final  . BUN 12/13/2018 13  6 - 23 mg/dL Final  . Creatinine, Ser 12/13/2018 0.71  0.40 - 1.20 mg/dL Final  . Total Bilirubin 12/13/2018 0.8  0.2 - 1.2 mg/dL Final  . Alkaline Phosphatase 12/13/2018 53  39 - 117 U/L Final  . AST 12/13/2018 13  0 - 37 U/L Final  . ALT 12/13/2018 12  0 - 35 U/L Final  . Total Protein 12/13/2018 6.7  6.0 - 8.3 g/dL Final  . Albumin 12/13/2018 4.2  3.5 - 5.2 g/dL Final  . Calcium 12/13/2018 9.7  8.4 - 10.5 mg/dL Final  . GFR 12/13/2018 95.86  >60.00 mL/min Final  . Hgb A1c MFr Bld 12/13/2018 6.7* 4.6 - 6.5 % Final   Glycemic Control Guidelines for People with Diabetes:Non Diabetic:  <6%Goal of Therapy: <7%Additional Action Suggested:  >8%     Allergies as of 12/17/2018   No Known Allergies     Medication List       Accurate as of December 17, 2018  8:26 AM. If you have any questions, ask your nurse or doctor.        STOP taking these medications   aspirin EC 81 MG tablet Stopped by: Elayne Snare, MD   cetirizine 10 MG tablet Commonly known as: ZYRTEC Stopped by: Elayne Snare, MD   losartan-hydrochlorothiazide 100-25 MG tablet Commonly known as: HYZAAR Stopped by: Elayne Snare, MD   prednisoLONE acetate 1 % ophthalmic suspension Commonly known as: PRED FORTE Stopped by: Vicenta Aly  Dwyane Dee, MD     TAKE these medications   Accu-Chek Aviva Plus w/Device Kit Use to check blood sugar 3-4 times per day.   Accu-Chek Softclix Lancets lancets Use bid   amLODipine 5 MG tablet Commonly known as: NORVASC Take 5 mg by mouth 2 (two) times daily. Take 1 tablets by mouth twice daily.   atorvastatin 10 MG tablet Commonly known as: LIPITOR Take 1 tablet (10 mg total) by mouth daily.   bisacodyl 5 MG EC tablet Commonly known as: DULCOLAX Take 5 mg by mouth daily.   glucose blood test strip Commonly known  as: Accu-Chek Guide 1 each by Other route 2 (two) times daily. Use as instructed   Accu-Chek Aviva Plus test strip Generic drug: glucose blood USE 1 STRIP TO CHECK GLUCOSE 4 TIMES DAILY   metFORMIN 1000 MG tablet Commonly known as: GLUCOPHAGE Take 1 tablet in the morning and 1 tablets in the evening   niacin 500 MG tablet Commonly known as: SLO-NIACIN Take 500 mg by mouth at bedtime.   NovoLIN N ReliOn 100 UNIT/ML injection Generic drug: insulin NPH Human INJECT 8 UNITS SUBCUTANEOUSLY IN THE MORNING AND 12 UNITS IN THE EVENING   NovoLIN R ReliOn 100 units/mL injection Generic drug: insulin regular INJECT 8 UNITS SUBCUTANEOUSLY WITH BREAKFAST AND LUNCH, AND 16 UNITS WITH DINNER   omeprazole 40 MG capsule Commonly known as: PRILOSEC Take 40 mg by mouth daily.   valsartan-hydrochlorothiazide 160-12.5 MG tablet Commonly known as: DIOVAN-HCT Take 1 tablet by mouth daily. Take 1 tablet by mouth once daily.   Vitamin D3 125 MCG (5000 UT) Caps Take 5,000 Units by mouth.       Allergies: No Known Allergies  Past Medical History:  Diagnosis Date  . Chronic kidney disease    kidney stone  . Diabetes mellitus without complication ([place])   . Hypertension   . Stroke [facility])     Past Surgical History:  Procedure Laterality Date  . BREAST BIOPSY Left   . CATARACT EXTRACTION Bilateral 2018   Dr. Tommy Rainwater  . HAND SURGERY    . NO PAST SURGERIES      History reviewed. No pertinent family history.  Social History:  reports that she has quit smoking. She has never used smokeless tobacco. She reports that she does not drink alcohol or use drugs.  Review of Systems:   HYPERTENSION:  she is on valsartan HCTZ and amlodipine Followed by PCP  HYPERLIPIDEMIA:  Fairly well controlled with Lipitor 10 mg and niacin 500 mg, Niacin has been prescribed by her PCP However LDL is consistently below 100 She thinks she has not missed any doses of Lipitor Risk factors: May have had a  history of stroke  Lab Results  Component Value Date   CHOL 158 12/13/2018   CHOL 164 04/26/2017   CHOL 166 01/11/2016   Lab Results  Component Value Date   HDL 39.70 12/13/2018   HDL 54.10 04/26/2017   HDL 57.80 01/11/2016   Lab Results  Component Value Date   LDLCALC 100 (H) 12/13/2018   [place] 99 04/26/2017   LDLCALC 98 01/11/2016   Lab Results  Component Value Date   TRIG 93.0 12/13/2018   TRIG 58.0 04/26/2017   TRIG 52.0 01/11/2016   Lab Results  Component Value Date   CHOLHDL 4 12/13/2018   CHOLHDL 3 04/26/2017   CHOLHDL 3 01/11/2016   No results found for: LDLDIRECT  Last diabetic foot exam in 12/19  She had diabetic eye  exam in 10/2018, report not available  She has had chronic episodes of diarrhea every few weeks but not consistently and she has been on Metformin for several years without side effect She is going to see a general surgeon because of finding of multiple gallstones      Examination:   BP 130/62 (BP Location: Left Arm, Patient Position: Sitting, Cuff Size: Normal)   Pulse 85   Ht 6' (1.829 m)   Wt 186 lb 12.8 oz (84.7 kg)   SpO2 98%   BMI 25.33 kg/m   Body mass index is 25.33 kg/m.     ASSESSMENT/ PLAN:   Diabetes type 2  See history of present illness for detailed discussion of  current management, blood sugar patterns and problems identified  She is again coming back irregularly for follow-up  A1c is 6.7 % and improving  With some weight loss and likely better diet overall blood sugars are better controlled Likely has better readings after meals also compared to before even though she has not checked readings after meals She is trying to be active also Discussed timing of taking her mealtime insulin She thinks she can try to be consistent with taking it 20 to 30 minutes before the meals and not afterwards We will give her new prescription for strips so that she can try to test at least some readings after meals consistently  To let us know if she has consistently high readings at any given time Also to let us know if blood sugars are getting low  More regular follow-up, scheduled for 4 months  HYPERTENSION: Well controlled  Lipids: LDL is 100 and because of multiple risk factors will increase her Lipitor to 20 mg  We will get report of her eye exam  There are no Patient Instructions on file for this visit.     Elayne Snare 12/17/2018, 8:26 AM   Note: This office note was prepared with Dragon voice recognition system technology. Any transcriptional errors that result from this process are unintentional.

## 2018-12-17 ENCOUNTER — Encounter: Payer: Self-pay | Admitting: Endocrinology

## 2018-12-17 ENCOUNTER — Ambulatory Visit (INDEPENDENT_AMBULATORY_CARE_PROVIDER_SITE_OTHER): Payer: Medicare HMO | Admitting: Endocrinology

## 2018-12-17 ENCOUNTER — Other Ambulatory Visit: Payer: Self-pay

## 2018-12-17 VITALS — BP 130/62 | HR 85 | Ht 72.0 in | Wt 186.8 lb

## 2018-12-17 DIAGNOSIS — E7800 Pure hypercholesterolemia, unspecified: Secondary | ICD-10-CM

## 2018-12-17 DIAGNOSIS — Z794 Long term (current) use of insulin: Secondary | ICD-10-CM

## 2018-12-17 DIAGNOSIS — E1165 Type 2 diabetes mellitus with hyperglycemia: Secondary | ICD-10-CM | POA: Diagnosis not present

## 2018-12-17 MED ORDER — METFORMIN HCL 1000 MG PO TABS: ORAL_TABLET | ORAL | 1 refills | Status: DC

## 2018-12-17 MED ORDER — ACCU-CHEK GUIDE VI STRP: 1.0000 | ORAL_STRIP | Freq: Two times a day (BID) | 3 refills | Status: DC

## 2018-12-17 MED ORDER — ATORVASTATIN CALCIUM 20 MG PO TABS: 20.0000 mg | ORAL_TABLET | Freq: Every day | ORAL | 3 refills | Status: AC

## 2018-12-17 NOTE — Patient Instructions (Signed)
Check blood sugars on waking up 3 days a week  Also check blood sugars about 2 hours after meals and do this after different meals by rotation  Recommended blood sugar levels on waking up are 90-130 and about 2 hours after meal is 130-160  Please bring your blood sugar monitor to each visit, thank you   

## 2018-12-23 ENCOUNTER — Other Ambulatory Visit: Payer: Self-pay

## 2018-12-23 MED ORDER — ACCU-CHEK GUIDE ME W/DEVICE KIT: 1.0000 | PACK | Freq: Two times a day (BID) | 0 refills | Status: AC

## 2018-12-25 ENCOUNTER — Telehealth: Payer: Self-pay | Admitting: Endocrinology

## 2018-12-25 ENCOUNTER — Other Ambulatory Visit: Payer: Self-pay

## 2018-12-25 NOTE — Telephone Encounter (Signed)
MEDICATION: Accu-Chek Plus test strip  PHARMACY:  [facility]  IS THIS A 90 DAY SUPPLY : yes  IS PATIENT OUT OF MEDICATION: no  IF NOT; HOW MUCH IS LEFT: 25 strips left  LAST APPOINTMENT DATE: @10 /26/2020  NEXT APPOINTMENT DATE:@2 /22/2021  DO WE HAVE YOUR PERMISSION TO LEAVE A DETAILED MESSAGE: yes [id number]  OTHER COMMENTS:    **Let patient know to contact pharmacy at the end of the day to make sure medication is ready. **  ** Please notify patient to allow 48-72 hours to process**  **Encourage patient to contact the pharmacy for refills or they can request refills through [facility]**

## 2018-12-25 NOTE — Telephone Encounter (Signed)
Pt is no longer using accu chek plus test strips. She is using Guide test strips and Rx was sent on 12/17/2018.

## 2018-12-30 ENCOUNTER — Other Ambulatory Visit: Payer: Self-pay | Admitting: Surgery

## 2018-12-30 DIAGNOSIS — K8020 Calculus of gallbladder without cholecystitis without obstruction: Secondary | ICD-10-CM | POA: Diagnosis not present

## 2019-01-13 DIAGNOSIS — I10 Essential (primary) hypertension: Secondary | ICD-10-CM | POA: Diagnosis not present

## 2019-01-13 DIAGNOSIS — E782 Mixed hyperlipidemia: Secondary | ICD-10-CM | POA: Diagnosis not present

## 2019-01-13 DIAGNOSIS — I6522 Occlusion and stenosis of left carotid artery: Secondary | ICD-10-CM | POA: Diagnosis not present

## 2019-01-13 DIAGNOSIS — E1169 Type 2 diabetes mellitus with other specified complication: Secondary | ICD-10-CM | POA: Diagnosis not present

## 2019-02-03 ENCOUNTER — Other Ambulatory Visit (HOSPITAL_COMMUNITY): Payer: Self-pay | Admitting: Family Medicine

## 2019-02-03 DIAGNOSIS — Z1231 Encounter for screening mammogram for malignant neoplasm of breast: Secondary | ICD-10-CM

## 2019-02-05 ENCOUNTER — Other Ambulatory Visit: Payer: Self-pay

## 2019-02-05 ENCOUNTER — Ambulatory Visit (HOSPITAL_COMMUNITY)
Admission: RE | Admit: 2019-02-05 | Discharge: 2019-02-05 | Disposition: A | Discharge status: Home / Self Care | Payer: Medicare HMO | Source: Ambulatory Visit | Attending: Family Medicine | Admitting: Family Medicine

## 2019-02-05 DIAGNOSIS — Z1231 Encounter for screening mammogram for malignant neoplasm of breast: Secondary | ICD-10-CM

## 2019-04-21 ENCOUNTER — Other Ambulatory Visit (INDEPENDENT_AMBULATORY_CARE_PROVIDER_SITE_OTHER): Payer: Medicare HMO

## 2019-04-21 ENCOUNTER — Other Ambulatory Visit: Payer: Self-pay

## 2019-04-21 DIAGNOSIS — E7800 Pure hypercholesterolemia, unspecified: Secondary | ICD-10-CM

## 2019-04-21 DIAGNOSIS — E1165 Type 2 diabetes mellitus with hyperglycemia: Secondary | ICD-10-CM

## 2019-04-21 DIAGNOSIS — Z794 Long term (current) use of insulin: Secondary | ICD-10-CM

## 2019-04-21 LAB — COMPREHENSIVE METABOLIC PANEL
ALT: 12 U/L (ref 0–35)
AST: 14 U/L (ref 0–37)
Albumin: 4.1 g/dL (ref 3.5–5.2)
Alkaline Phosphatase: 55 U/L (ref 39–117)
BUN: 15 mg/dL (ref 6–23)
CO2: 26 mEq/L (ref 19–32)
Calcium: 9.6 mg/dL (ref 8.4–10.5)
Chloride: 107 mEq/L (ref 96–112)
Creatinine, Ser: 0.68 mg/dL (ref 0.40–1.20)
GFR: 100.67 mL/min (ref >60.00)
Glucose, Bld: 107 mg/dL — ABNORMAL HIGH (ref 70–99)
Potassium: 3.9 mEq/L (ref 3.5–5.1)
Sodium: 142 mEq/L (ref 135–145)
Total Bilirubin: 0.4 mg/dL (ref 0.2–1.2)
Total Protein: 6.6 g/dL (ref 6.0–8.3)

## 2019-04-21 LAB — HEMOGLOBIN A1C: Hgb A1c MFr Bld: 6.6 % — ABNORMAL HIGH (ref 4.6–6.5)

## 2019-04-21 LAB — LIPID PANEL
Cholesterol: 142 mg/dL (ref 0–200)
HDL: 52.2 mg/dL (ref >39.00)
LDL Cholesterol: 78 mg/dL (ref 0–99)
NonHDL: 89.73
Total CHOL/HDL Ratio: 3
Triglycerides: 57 mg/dL (ref 0.0–149.0)
VLDL: 11.4 mg/dL (ref 0.0–40.0)

## 2019-04-21 LAB — MICROALBUMIN / CREATININE URINE RATIO
Creatinine,U: 72.3 mg/dL
Microalb Creat Ratio: 1 mg/g (ref 0.0–30.0)
Microalb, Ur: <0.7 mg/dL (ref 0.0–1.9)

## 2019-04-24 ENCOUNTER — Ambulatory Visit: Payer: Medicare HMO | Admitting: Endocrinology

## 2019-04-24 ENCOUNTER — Encounter: Payer: Self-pay | Admitting: Endocrinology

## 2019-04-24 ENCOUNTER — Other Ambulatory Visit: Payer: Self-pay

## 2019-04-24 VITALS — BP 140/62 | HR 75 | Ht 72.0 in | Wt 185.2 lb

## 2019-04-24 DIAGNOSIS — Z794 Long term (current) use of insulin: Secondary | ICD-10-CM

## 2019-04-24 DIAGNOSIS — I10 Essential (primary) hypertension: Secondary | ICD-10-CM | POA: Diagnosis not present

## 2019-04-24 DIAGNOSIS — E7800 Pure hypercholesterolemia, unspecified: Secondary | ICD-10-CM | POA: Diagnosis not present

## 2019-04-24 DIAGNOSIS — E1165 Type 2 diabetes mellitus with hyperglycemia: Secondary | ICD-10-CM | POA: Diagnosis not present

## 2019-04-24 NOTE — Patient Instructions (Signed)
N insulin: 10 at bedtime,  No insulin at lunch if only having snack  12 R at supper for lo carb or lite meals

## 2019-04-24 NOTE — Progress Notes (Signed)
Patient ID: Sandra Washington, female   DOB: 05/11/1940, 79 y.o.   MRN: [id number]   Reason for Appointment: Diabetes follow-up   History of Present Illness   Diagnosis: Type 2 DIABETES MELITUS, date of diagnosis:   1995       She has been on insulin almost since her diagnosis and has had various regimens over the years More recently has been on basal bolus insulin with Lantus and Humalog with variable control, depending on her compliance with diet and exercise She was subsequently changed to NPH and regular insulin because of cost  Recent history:  Insulin regimen: Novolin N insulin 8 units a.m.--12 at bedtime,  Novolin R 10-05-14 ac tid   Her A1c is improved again at 6.6, previously 6.7  Last visit was 4 months ago   Current blood sugar patterns, management and problems:  She checks blood sugars infrequently and appears to be checking more regularly in the last 2 weeks only  Most of her blood sugars are near normal or low  She does not adjust her regular insulin based on her meal size  With this she has had a low sugar late afternoon months and also at bedtime when she was either not eating a full meal or eating a small meal  Has only 1 unusually high reading of 188 after dinner  FASTING blood sugars have not been checked and only checking readings after breakfast in the mornings  No overnight hypoglycemia  Fasting glucose was 107  Most of her readings after breakfast are in the normal range  She is trying to walk around her home and not able to do much formal exercise  Her weight has stayed the same, previously had lost weight  Has been regular with her Metformin without side effects  Oral hypoglycemic drugs:  metformin  1g bid     Side effects from medications: None              Monitors blood glucose: Once a day or less .    Glucometer:  Accu-Chek  Blood Glucose readings:    PRE-MEAL Fasting Lunch Dinner Bedtime Overall  Glucose range:  74    66  54, 134   Mean/median:      102   POST-MEAL PC Breakfast PC Lunch PC Dinner  Glucose range:  74-129  74, 125  133, 188  Mean/median:  97      PREVIOUS readings:  PRE-MEAL Fasting Lunch Dinner Bedtime Overall  Glucose range:  84-177   99    Mean/median:  108    108           Meals: 3 meals per day.  breakfast is usually at 6-7 AM: Cereal or egg/toast.  Lunch  12:30pm             Dietician visit: Most recent: 03/2008           Wt Readings from Last 3 Encounters:  04/24/19 185 lb 3.2 oz (84 kg)  12/17/18 186 lb 12.8 oz (84.7 kg)  02/05/18 194 lb 9.6 oz (88.3 kg)    Lab Results  Component Value Date   HGBA1C 6.6 (H) 04/21/2019   HGBA1C 6.7 (H) 12/13/2018   HGBA1C 7.0 (H) 02/01/2018   Lab Results  Component Value Date   MICROALBUR <0.7 04/21/2019   LDLCALC 78 04/21/2019   CREATININE 0.68 04/21/2019    OTHER problems discussed today: See review of systems   Lab on 04/21/2019  Component Date  Value Ref Range Status  . Cholesterol 04/21/2019 142  0 - 200 mg/dL Final   ATP III Classification       Desirable:  < 200 mg/dL               Borderline High:  200 - 239 mg/dL          High:  > = 240 mg/dL  . Triglycerides 04/21/2019 57.0  0.0 - 149.0 mg/dL Final   Normal:  <150 mg/dLBorderline High:  150 - 199 mg/dL  . HDL 04/21/2019 52.20  >39.00 mg/dL Final  . VLDL 04/21/2019 11.4  0.0 - 40.0 mg/dL Final  . LDL Cholesterol 04/21/2019 78  0 - 99 mg/dL Final  . Total CHOL/HDL Ratio 04/21/2019 3   Final                  Men          Women1/2 Average Risk     3.4          3.3Average Risk          5.0          4.42X Average Risk          9.6          7.13X Average Risk          15.0          11.0                      . NonHDL 04/21/2019 89.73   Final   NOTE:  Non-HDL goal should be 30 mg/dL higher than patient's LDL goal (i.e. LDL goal of < 70 mg/dL, would have non-HDL goal of < 100 mg/dL)  . Microalb, Ur 04/21/2019 <0.7  0.0 - 1.9 mg/dL Final  . Creatinine,U 04/21/2019 72.3   mg/dL Final  . Microalb Creat Ratio 04/21/2019 1.0  0.0 - 30.0 mg/g Final  . Sodium 04/21/2019 142  135 - 145 mEq/L Final  . Potassium 04/21/2019 3.9  3.5 - 5.1 mEq/L Final  . Chloride 04/21/2019 107  96 - 112 mEq/L Final  . CO2 04/21/2019 26  19 - 32 mEq/L Final  . Glucose, Bld 04/21/2019 107* 70 - 99 mg/dL Final  . BUN 04/21/2019 15  6 - 23 mg/dL Final  . Creatinine, Ser 04/21/2019 0.68  0.40 - 1.20 mg/dL Final  . Total Bilirubin 04/21/2019 0.4  0.2 - 1.2 mg/dL Final  . Alkaline Phosphatase 04/21/2019 55  39 - 117 U/L Final  . AST 04/21/2019 14  0 - 37 U/L Final  . ALT 04/21/2019 12  0 - 35 U/L Final  . Total Protein 04/21/2019 6.6  6.0 - 8.3 g/dL Final  . Albumin 04/21/2019 4.1  3.5 - 5.2 g/dL Final  . GFR 04/21/2019 100.67  >60.00 mL/min Final  . Calcium 04/21/2019 9.6  8.4 - 10.5 mg/dL Final  . Hgb A1c MFr Bld 04/21/2019 6.6* 4.6 - 6.5 % Final   Glycemic Control Guidelines for People with Diabetes:Non Diabetic:  <6%Goal of Therapy: <7%Additional Action Suggested:  >8%     Allergies as of 04/24/2019   No Known Allergies     Medication List       Accurate as of April 24, 2019  8:38 AM. If you have any questions, ask your nurse or doctor.        Accu-Chek Guide Me w/Device Kit 1 each by Does not apply route 2 (two) times daily.  Use accu chek guide me device to check blood sugar twice daily.DX:E11.65   Accu-Chek Guide test strip Generic drug: glucose blood 1 each by Other route 2 (two) times daily. Use as instructed   Accu-Chek Softclix Lancets lancets Use bid   amLODipine 5 MG tablet Commonly known as: NORVASC Take 5 mg by mouth 2 (two) times daily. Take 1 tablets by mouth twice daily.   atorvastatin 20 MG tablet Commonly known as: LIPITOR Take 1 tablet (20 mg total) by mouth daily.   bisacodyl 5 MG EC tablet Commonly known as: DULCOLAX Take 5 mg by mouth daily.   metFORMIN 1000 MG tablet Commonly known as: GLUCOPHAGE Take 1 tablet in the morning and 1  tablets in the evening   niacin 500 MG tablet Commonly known as: SLO-NIACIN Take 500 mg by mouth at bedtime.   NovoLIN N ReliOn 100 UNIT/ML injection Generic drug: insulin NPH Human INJECT 8 UNITS SUBCUTANEOUSLY IN THE MORNING AND 12 UNITS IN THE EVENING   NovoLIN R ReliOn 100 units/mL injection Generic drug: insulin regular INJECT 8 UNITS SUBCUTANEOUSLY WITH BREAKFAST AND LUNCH, AND 16 UNITS WITH DINNER   omeprazole 40 MG capsule Commonly known as: PRILOSEC Take 40 mg by mouth daily.   valsartan-hydrochlorothiazide 160-12.5 MG tablet Commonly known as: DIOVAN-HCT Take 1 tablet by mouth daily. Take 1 tablet by mouth once daily.   Vitamin D3 125 MCG (5000 UT) Caps Take 5,000 Units by mouth.       Allergies: No Known Allergies  Past Medical History:  Diagnosis Date  . Chronic kidney disease    kidney stone  . Diabetes mellitus without complication ([place])   . Hypertension   . Stroke [facility])     Past Surgical History:  Procedure Laterality Date  . BREAST BIOPSY Left   . CATARACT EXTRACTION Bilateral 2018   Dr. Tommy Rainwater  . HAND SURGERY    . NO PAST SURGERIES      History reviewed. No pertinent family history.  Social History:  reports that she has quit smoking. She has never used smokeless tobacco. She reports that she does not drink alcohol or use drugs.  Review of Systems:   HYPERTENSION:  she is on valsartan HCTZ and amlodipine Followed by PCP  HYPERLIPIDEMIA:  Previously controlled with Lipitor 10 mg and niacin 500 mg, Niacin has been prescribed by her PCP Since LDL was higher on her last visit she is now taking 20 mg of atorvastatin without difficulties LDL is significantly better  Risk factors: May have had a history of stroke  Lab Results  Component Value Date   CHOL 142 04/21/2019   CHOL 158 12/13/2018   CHOL 164 04/26/2017   Lab Results  Component Value Date   HDL 52.20 04/21/2019   HDL 39.70 12/13/2018   HDL 54.10 04/26/2017   Lab Results   Component Value Date   LDLCALC 78 04/21/2019   [place] 100 (H) 12/13/2018   [place] 99 04/26/2017   Lab Results  Component Value Date   TRIG 57.0 04/21/2019   TRIG 93.0 12/13/2018   TRIG 58.0 04/26/2017   Lab Results  Component Value Date   CHOLHDL 3 04/21/2019   CHOLHDL 4 12/13/2018   CHOLHDL 3 04/26/2017   No results found for: LDLDIRECT  Last diabetic foot exam in 12/19  She had diabetic eye exam in 10/2018 with only a few microaneurysms present       Examination:   BP 140/62 (BP Location: Left Arm, Patient Position: Sitting, Cuff  Size: Normal)   Pulse 75   Ht 6' (1.829 m)   Wt 185 lb 3.2 oz (84 kg)   SpO2 97%   BMI 25.12 kg/m   Body mass index is 25.12 kg/m.     ASSESSMENT/ PLAN:   Diabetes type 2  See history of present illness for detailed discussion of  current management, blood sugar patterns and problems identified  She is on insulin and Metformin  A1c is 6.7 % and consistently improved  Blood sugars at home are fairly close to normal and also occasionally has hypoglycemia Although fasting readings are not being monitored much they have been as low as 74 Most of her monitoring after breakfast and not clear if she has any fluctuation of her blood sugars after lunch and dinner She is trying to take her regular insulin before eating instead of after and directed on her last visit  Recommendations:  Reduce bedtime NPH to 10 units instead of 12  Again reminded her to try and take her regular insulin 20 to 30 minutes before planning to eat  She can skip the lunchtime dose if tolerating a snack and not a proper meal  She will also reduce her suppertime dose to about 12 units if eating only a small meal or less carbohydrate such as with salads  Start walking outside or at the gym when able to  More blood sugars fasting and after lunch and dinner  HYPERTENSION: Well controlled on amlodipine and valsartan HCT Microalbumin normal  Lipids: LDL is 78  and she will continue with the higher dose of Lipitor   We will get report of her eye exam  Patient Instructions  N insulin: 10 at bedtime,  No insulin at lunch if only having snack  12 R at supper for lo carb or lite meals      Elayne Snare 04/24/2019, 8:38 AM   Note: This office note was prepared with Dragon voice recognition system technology. Any transcriptional errors that result from this process are unintentional.

## 2019-07-25 DIAGNOSIS — E1169 Type 2 diabetes mellitus with other specified complication: Secondary | ICD-10-CM | POA: Diagnosis not present

## 2019-07-25 DIAGNOSIS — R197 Diarrhea, unspecified: Secondary | ICD-10-CM | POA: Diagnosis not present

## 2019-07-25 DIAGNOSIS — I10 Essential (primary) hypertension: Secondary | ICD-10-CM | POA: Diagnosis not present

## 2019-07-28 DIAGNOSIS — I10 Essential (primary) hypertension: Secondary | ICD-10-CM | POA: Diagnosis not present

## 2019-07-28 DIAGNOSIS — E1100 Type 2 diabetes mellitus with hyperosmolarity without nonketotic hyperglycemic-hyperosmolar coma (NKHHC): Secondary | ICD-10-CM | POA: Diagnosis not present

## 2019-07-28 DIAGNOSIS — E783 Hyperchylomicronemia: Secondary | ICD-10-CM | POA: Diagnosis not present

## 2019-07-28 DIAGNOSIS — Z794 Long term (current) use of insulin: Secondary | ICD-10-CM | POA: Diagnosis not present

## 2019-07-31 NOTE — Progress Notes (Signed)
[facility] Note  08/05/2019     CHIEF COMPLAINT Patient presents for Retina Follow Up   HISTORY OF PRESENT ILLNESS: Sandra Washington is a 79 y.o. female who presents to the clinic today for:   HPI    Retina Follow Up    Patient presents with  Diabetic Retinopathy.  In both eyes.  This started 9 months ago.  Severity is moderate.  I, the attending physician,  performed the HPI with the patient and updated documentation appropriately.          Comments    Patient here for 9 months retina follow up for NPDR OU. Patient states vision can tell a little change. Not a whole lot. No eye pain.       Last edited by Bernarda Caffey, MD on 08/05/2019  4:37 PM. (History)    pt states she is doing well, her BP and blood sugar are under control, her A1c was 6.6 in February, she sees Dr. Jorja Loa on June 18 for a routine eye exam   Referring physician: Madelin Headings, DO [address] [place],  [place] [address]  HISTORICAL INFORMATION:   Selected notes from the MEDICAL RECORD NUMBER Referred by Dr. Madelin Headings for concern of diabetic retinopathy    CURRENT MEDICATIONS: No current outpatient medications on file. (Ophthalmic Drugs)   No current facility-administered medications for this visit. (Ophthalmic Drugs)   Current Outpatient Medications (Other)  Medication Sig   ACCU-CHEK SOFTCLIX LANCETS lancets Use bid   amLODipine (NORVASC) 5 MG tablet Take 5 mg by mouth 2 (two) times daily. Take 1 tablets by mouth twice daily.   atorvastatin (LIPITOR) 20 MG tablet Take 1 tablet (20 mg total) by mouth daily.   bisacodyl (DULCOLAX) 5 MG EC tablet Take 5 mg by mouth daily.   Blood Glucose Monitoring Suppl (ACCU-CHEK GUIDE ME) w/Device KIT 1 each by Does not apply route 2 (two) times daily. Use accu chek guide me device to check blood sugar twice daily.DX:E11.65   Cholecalciferol (VITAMIN D3) 5000 UNITS CAPS Take 5,000 Units by mouth.   glucose blood  (ACCU-CHEK GUIDE) test strip 1 each by Other route 2 (two) times daily. Use as instructed   metFORMIN (GLUCOPHAGE) 1000 MG tablet Take 1 tablet in the morning and 1 tablets in the evening   niacin (SLO-NIACIN) 500 MG tablet Take 500 mg by mouth at bedtime.   NOVOLIN N RELION 100 UNIT/ML injection INJECT 8 UNITS SUBCUTANEOUSLY IN THE MORNING AND 12 UNITS IN THE EVENING   NOVOLIN R RELION 100 UNIT/ML injection  INJECT 8 UNITS SUBCUTANEOUSLY WITH BREAKFAST AND LUNCH, AND 16 UNITS WITH DINNER   omeprazole (PRILOSEC) 40 MG capsule Take 40 mg by mouth daily.   valsartan-hydrochlorothiazide (DIOVAN-HCT) 160-12.5 MG tablet Take 1 tablet by mouth daily. Take 1 tablet by mouth once daily.   No current facility-administered medications for this visit. (Other)      REVIEW OF SYSTEMS: ROS    Positive for: Musculoskeletal, Endocrine, Eyes   Negative for: Constitutional, Gastrointestinal, Neurological, Skin, Genitourinary, HENT, Cardiovascular, Respiratory, Psychiatric, Allergic/Imm, Heme/Lymph   Last edited by Theodore Demark, COA on 08/05/2019  8:56 AM. (History)       ALLERGIES No Known Allergies  PAST MEDICAL HISTORY Past Medical History:  Diagnosis Date   Chronic kidney disease    kidney stone   Diabetes mellitus without complication ([place])    Hypertension    Stroke [facility])    Past Surgical History:  Procedure Laterality Date   BREAST BIOPSY Left    CATARACT EXTRACTION Bilateral 2018   Dr. Tommy Rainwater   HAND SURGERY     NO PAST SURGERIES      FAMILY HISTORY History reviewed. No pertinent family history.  SOCIAL HISTORY Social History   Tobacco Use   Smoking status: Former Smoker   Smokeless tobacco: Never Used  Substance Use Topics   Alcohol use: No   Drug use: No         OPHTHALMIC EXAM:  Base Eye Exam    Visual Acuity (Snellen - Linear)      Right Left   Dist cc 20/20 -2 20/25 -1   Dist ph cc  20/25 +1   Correction: Glasses       Tonometry  (Tonopen, 8:53 AM)      Right Left   Pressure 14 12       Pupils      Dark Light Shape React APD   Right 3 2 Round Brisk None   Left 3 2 Round Brisk None       Visual Fields (Counting fingers)      Left Right    Full Full       Extraocular Movement      Right Left    Full, Ortho Full, Ortho       Neuro/Psych    Oriented x3: Yes   Mood/Affect: Normal       Dilation    Right eye: 1.0% Mydriacyl, 2.5% Phenylephrine @ 8:53 AM        Slit Lamp and Fundus Exam    Slit Lamp Exam      Right Left   Lids/Lashes Dermatochalasis - upper lid, Meibomian gland dysfunction Dermatochalasis - upper lid, Meibomian gland dysfunction   Conjunctiva/Sclera nasal and temporal Pinguecula, Melanosis nasal and temporal Pinguecula, Melanosis   Cornea Arcus, 1+ Punctate epithelial erosions, Well healed temporal cataract wounds Arcus, 1+ Punctate epithelial erosions, Well healed temporal cataract wounds   Anterior Chamber Deep and clear, Narrow angles moderate depth   Iris Round and moderately dilated, focal iris atrophy at 1100  --  PI not open Round and moderately dilated, small patent PI at 0200, mild bowing    Lens PC IOL in good position with open PC, mild elschnig pearls superiorly/inferiorly PC IOL in good position with open PC   Vitreous Vitreous syneresis Vitreous syneresis       Fundus Exam      Right Left   Disc Pink and Sharp Pink and Sharp   C/D Ratio 0.2 0.2   Macula Flat, Blunted foveal reflex, rare Microaneurysms, focal drusen SN to fovea, no edema Flat, Blunted foveal reflex, scattered Microaneurysms, no edema   Vessels mild Vascular attenuation mild Vascular attenuation   Periphery Attached, scattered IRH Attached, scattered MA        Refraction    Wearing Rx      Sphere Cylinder Axis Add   Right -2.25 +2.25 180 +2.50   Left -1.00 +1.50 175 +2.50   Type: prog          IMAGING AND PROCEDURES  Imaging and Procedures for '@TODAY' @  OCT, Retina - OU - Both Eyes        Right Eye Quality was good. Central Foveal Thickness: 260. Progression has been stable. Findings include normal foveal contour, no SRF, no IRF (Trace cystic changes; mild vitreous opacities).   Left Eye Quality was good. Central Foveal Thickness: 239. Progression has been stable.  Findings include normal foveal contour, no SRF, intraretinal hyper-reflective material, no IRF (stable improvement in cystic changes and IRHM).   Notes *Images captured and stored on drive  Diagnosis / Impression:  OD: Trace cystic changes; mild vitreous opacities OS: stable improvement in cystic changes and IRHM   Clinical management:  See below  Abbreviations: NFP - Normal foveal profile. CME - cystoid macular edema. PED - pigment epithelial detachment. IRF - intraretinal fluid. SRF - subretinal fluid. EZ - ellipsoid zone. ERM - epiretinal membrane. ORA - outer retinal atrophy. ORT - outer retinal tubulation. SRHM - subretinal hyper-reflective material                 ASSESSMENT/PLAN:    ICD-10-CM   1. Moderate nonproliferative diabetic retinopathy of both eyes without macular edema associated with type 2 diabetes mellitus ([place])  O75.6433   2. Retinal edema  H35.81 OCT, Retina - OU - Both Eyes  3. Essential hypertension  I10   4. Hypertensive retinopathy of both eyes  H35.033   5. Pseudophakia of both eyes  Z96.1   6. Anatomical narrow angle  H40.039     1,2. Moderate nonproliferative diabetic retinopathy w/o DME, OU  - exam with scattered MA OU  - BCVA good, stable at 20/20 OD, 20/25 OS  - OCT with stable improvement in tr cystic changes OU; no frank diabetic macular edema OU  - discussed findings, prognosis  - recommend continued monitoring  - f/u in 9-12 mos, sooner prn -- DFE/OCT  3,4. Hypertensive retinopathy OU  - discussed importance of tight BP control  - monitor  5. Pseudophakia OU  - s/p CE/IOL OU  - beautiful surgeries by Dr. Talbert Forest, doing well  - monitor  6.  Anatomical Narrow Angles OU  - s/p LPI OU by Dr. Talbert Forest  - OS patent LPI and open angle  - OD w/ closed PI  - saw Dr. Nancy Fetter on 9.21.20 -- no intervention recommended    Ophthalmic Meds Ordered this visit:  No orders of the defined types were placed in this encounter.      Return for f/u 9-12 months, NPDR OU, DFE, OCT.  There are no Patient Instructions on file for this visit.   Explained the diagnoses, plan, and follow up with the patient and they expressed understanding.  Patient expressed understanding of the importance of proper follow up care.   This document serves as a record of services personally performed by Gardiner Sleeper, MD, PhD. It was created on their behalf by Estill Bakes, COT an ophthalmic technician. The creation of this record is the provider's dictation and/or activities during the visit.    Electronically signed by: Estill Bakes, COT 07/31/19 @ 4:59 PM   This document serves as a record of services personally performed by Gardiner Sleeper, MD, PhD. It was created on their behalf by Ernest Mallick, OA, an ophthalmic assistant. The creation of this record is the provider's dictation and/or activities during the visit.    Electronically signed by: Ernest Mallick, OA 06.08.2021 4:59 PM  Gardiner Sleeper, M.D., Ph.D. Diseases & Surgery of the Retina and [place] 08/05/2019   I have reviewed the above documentation for accuracy and completeness, and I agree with the above. Gardiner Sleeper, M.D., Ph.D. 08/05/19 4:59 PM   Abbreviations: M myopia (nearsighted); A astigmatism; H hyperopia (farsighted); P presbyopia; Mrx spectacle prescription;  CTL contact lenses; OD right eye; OS left eye; OU both eyes  XT exotropia; ET esotropia; PEK punctate epithelial keratitis; PEE punctate epithelial erosions; DES dry eye syndrome; MGD meibomian gland dysfunction; ATs artificial tears; PFAT's preservative free artificial tears; [place] nuclear sclerotic  cataract; PSC posterior subcapsular cataract; ERM epi-retinal membrane; PVD posterior vitreous detachment; RD retinal detachment; DM diabetes mellitus; DR diabetic retinopathy; NPDR non-proliferative diabetic retinopathy; PDR proliferative diabetic retinopathy; CSME clinically significant macular edema; DME diabetic macular edema; dbh dot blot hemorrhages; CWS cotton wool spot; POAG primary open angle glaucoma; C/D cup-to-disc ratio; HVF humphrey visual field; GVF goldmann visual field; OCT optical coherence tomography; IOP intraocular pressure; BRVO Branch retinal vein occlusion; CRVO central retinal vein occlusion; CRAO central retinal artery occlusion; BRAO branch retinal artery occlusion; RT retinal tear; SB scleral buckle; PPV pars plana vitrectomy; VH Vitreous hemorrhage; PRP panretinal laser photocoagulation; IVK intravitreal kenalog; VMT vitreomacular traction; MH Macular hole;  NVD neovascularization of the disc; NVE neovascularization elsewhere; AREDS age related eye disease study; ARMD age related macular degeneration; POAG primary open angle glaucoma; EBMD epithelial/anterior basement membrane dystrophy; ACIOL anterior chamber intraocular lens; IOL intraocular lens; PCIOL posterior chamber intraocular lens; Phaco/IOL phacoemulsification with intraocular lens placement; [place] photorefractive keratectomy; LASIK laser assisted in situ keratomileusis; HTN hypertension; DM diabetes mellitus; COPD chronic obstructive pulmonary disease

## 2019-08-05 ENCOUNTER — Other Ambulatory Visit: Payer: Self-pay

## 2019-08-05 ENCOUNTER — Ambulatory Visit (INDEPENDENT_AMBULATORY_CARE_PROVIDER_SITE_OTHER): Payer: Medicare HMO | Admitting: Ophthalmology

## 2019-08-05 ENCOUNTER — Encounter (INDEPENDENT_AMBULATORY_CARE_PROVIDER_SITE_OTHER): Payer: Self-pay | Admitting: Ophthalmology

## 2019-08-05 DIAGNOSIS — H3581 Retinal edema: Secondary | ICD-10-CM

## 2019-08-05 DIAGNOSIS — H40039 Anatomical narrow angle, unspecified eye: Secondary | ICD-10-CM

## 2019-08-05 DIAGNOSIS — Z961 Presence of intraocular lens: Secondary | ICD-10-CM

## 2019-08-05 DIAGNOSIS — E113393 Type 2 diabetes mellitus with moderate nonproliferative diabetic retinopathy without macular edema, bilateral: Secondary | ICD-10-CM | POA: Diagnosis not present

## 2019-08-05 DIAGNOSIS — I10 Essential (primary) hypertension: Secondary | ICD-10-CM

## 2019-08-05 DIAGNOSIS — H35033 Hypertensive retinopathy, bilateral: Secondary | ICD-10-CM

## 2019-08-08 ENCOUNTER — Other Ambulatory Visit: Payer: Self-pay | Admitting: Endocrinology

## 2019-08-15 ENCOUNTER — Other Ambulatory Visit (INDEPENDENT_AMBULATORY_CARE_PROVIDER_SITE_OTHER): Payer: Medicare HMO

## 2019-08-15 ENCOUNTER — Other Ambulatory Visit: Payer: Self-pay

## 2019-08-15 DIAGNOSIS — E1165 Type 2 diabetes mellitus with hyperglycemia: Secondary | ICD-10-CM

## 2019-08-15 DIAGNOSIS — Z794 Long term (current) use of insulin: Secondary | ICD-10-CM | POA: Diagnosis not present

## 2019-08-15 LAB — BASIC METABOLIC PANEL
BUN: 16 mg/dL (ref 6–23)
CO2: 29 mEq/L (ref 19–32)
Calcium: 9.6 mg/dL (ref 8.4–10.5)
Chloride: 106 mEq/L (ref 96–112)
Creatinine, Ser: 0.68 mg/dL (ref 0.40–1.20)
GFR: 100.59 mL/min (ref >60.00)
Glucose, Bld: 107 mg/dL — ABNORMAL HIGH (ref 70–99)
Potassium: 4 mEq/L (ref 3.5–5.1)
Sodium: 142 mEq/L (ref 135–145)

## 2019-08-15 LAB — HEMOGLOBIN A1C: Hgb A1c MFr Bld: 7.3 % — ABNORMAL HIGH (ref 4.6–6.5)

## 2019-08-18 ENCOUNTER — Other Ambulatory Visit: Payer: Medicare HMO

## 2019-08-21 ENCOUNTER — Ambulatory Visit: Payer: Medicare HMO | Admitting: Endocrinology

## 2019-08-21 ENCOUNTER — Other Ambulatory Visit: Payer: Self-pay

## 2019-08-21 ENCOUNTER — Encounter: Payer: Self-pay | Admitting: Endocrinology

## 2019-08-21 VITALS — BP 122/70 | HR 71 | Ht 72.0 in | Wt 190.2 lb

## 2019-08-21 DIAGNOSIS — I10 Essential (primary) hypertension: Secondary | ICD-10-CM | POA: Diagnosis not present

## 2019-08-21 DIAGNOSIS — Z794 Long term (current) use of insulin: Secondary | ICD-10-CM | POA: Diagnosis not present

## 2019-08-21 DIAGNOSIS — E1165 Type 2 diabetes mellitus with hyperglycemia: Secondary | ICD-10-CM

## 2019-08-21 DIAGNOSIS — E7800 Pure hypercholesterolemia, unspecified: Secondary | ICD-10-CM

## 2019-08-21 LAB — GLUCOSE, POCT (MANUAL RESULT ENTRY): POC Glucose: 176 mg/dl — AB (ref 70–99)

## 2019-08-21 MED ORDER — METFORMIN HCL ER 500 MG PO TB24: 1500.0000 mg | ORAL_TABLET | Freq: Every day | ORAL | 3 refills | Status: DC

## 2019-08-21 NOTE — Patient Instructions (Signed)
Start taking Metformin 500 mg, 1 tablet with your main meal for 5 days. Occasionally this may initially cause loose stools or nausea.  If  tolerating well after 5 days add a second Metformin tablet (500 mg)    Continue adding another tablet after 5 days days if no persistent nausea or diarrhea until reaching the maximum tolerated dose or the full dose of 3   Check blood sugars on waking up 3-4 days a week  Also check blood sugars about 2 hours after meals and do this after different meals by rotation  Recommended blood sugar levels on waking up are 90-130 and about 2 hours after meal is 130-160  Please bring your blood sugar monitor to each visit, thank you  Take Novolin R BEFORE MEALS

## 2019-08-21 NOTE — Progress Notes (Signed)
Patient ID: Sandra Washington, female   DOB: Jan 11, 1941, 79 y.o.   MRN: [id number]   Reason for Appointment: Diabetes follow-up   History of Present Illness   Diagnosis: Type 2 DIABETES MELITUS, date of diagnosis:   1995       She has been on insulin almost since her diagnosis and has had various regimens over the years More recently has been on basal bolus insulin with Lantus and Humalog with variable control, depending on her compliance with diet and exercise She was subsequently changed to NPH and regular insulin because of cost  Recent history:  Insulin regimen: Novolin N insulin 8 units a.m.-- 10 units at bedtime,  Novolin R 10-05-14 ac tid   Oral hypoglycemic drugs:  metformin, see below    Side effects from medications:  Diarrhea from Metformin  Her A1c is relatively higher at 7.3  Last visit was 4 months ago   Current blood sugar patterns, management and problems:  She did not bring her monitor for download  Although she has been on Metformin for several years she apparently has had diarrhea for the last couple of months  She tried cutting back her Metformin to once a day with improvement in her diarrhea but for the last week has not taken any because of periodic diarrhea and now her diarrhea has resolved  She thinks that she only has occasional high readings, some of these are related to eating too much food like cantaloupes  Today she did not take her insulin before eating  She now reveals that she is taking her NOVOLIN R after eating instead of before  However with reducing her Novolin NPH at night her blood sugars are not low overnight  She has not increased walking as yet and does this only occasionally  Weight is about 5 pounds  Blood sugar today is higher because of not taking her Novolin today              Monitors blood glucose: Once a day or less .    Glucometer:  Accu-Chek  Blood Glucose readings by recall:    PRE-MEAL Fasting Lunch  Dinner Bedtime Overall  Glucose range: 97-117      Mean/median:        POST-MEAL PC Breakfast PC Lunch PC Dinner  Glucose range:   Up to 200  140-180,  Mean/median:      Previous data:  PRE-MEAL Fasting Lunch Dinner Bedtime Overall  Glucose range:  74   66  54, 134   Mean/median:      102   POST-MEAL PC Breakfast PC Lunch PC Dinner  Glucose range:  74-129  74, 125  133, 188  Mean/median:  97             Meals: 3 meals per day.  breakfast is usually at 6-7 AM: Cereal or egg/toast.  Lunch  12:30pm             Dietician visit: Most recent: 03/2008           Wt Readings from Last 3 Encounters:  08/21/19 190 lb 3.2 oz (86.3 kg)  04/24/19 185 lb 3.2 oz (84 kg)  12/17/18 186 lb 12.8 oz (84.7 kg)    Lab Results  Component Value Date   HGBA1C 7.3 (H) 08/15/2019   HGBA1C 6.6 (H) 04/21/2019   HGBA1C 6.7 (H) 12/13/2018   Lab Results  Component Value Date   MICROALBUR <0.7 04/21/2019   [place] 78 04/21/2019  CREATININE 0.68 08/15/2019    OTHER problems discussed today: See review of systems   Office Visit on 08/21/2019  Component Date Value Ref Range Status  . POC Glucose 08/21/2019 176* 70 - 99 mg/dl Final  Lab on 08/15/2019  Component Date Value Ref Range Status  . Sodium 08/15/2019 142  135 - 145 mEq/L Final  . Potassium 08/15/2019 4.0  3.5 - 5.1 mEq/L Final  . Chloride 08/15/2019 106  96 - 112 mEq/L Final  . CO2 08/15/2019 29  19 - 32 mEq/L Final  . Glucose, Bld 08/15/2019 107* 70 - 99 mg/dL Final  . BUN 08/15/2019 16  6 - 23 mg/dL Final  . Creatinine, Ser 08/15/2019 0.68  0.40 - 1.20 mg/dL Final  . GFR 08/15/2019 100.59  >60.00 mL/min Final  . Calcium 08/15/2019 9.6  8.4 - 10.5 mg/dL Final  . Hgb A1c MFr Bld 08/15/2019 7.3* 4.6 - 6.5 % Final   Glycemic Control Guidelines for People with Diabetes:Non Diabetic:  <6%Goal of Therapy: <7%Additional Action Suggested:  >8%     Allergies as of 08/21/2019   No Known Allergies     Medication List       Accurate  as of August 21, 2019  9:08 AM. If you have any questions, ask your nurse or doctor.        STOP taking these medications   metFORMIN 1000 MG tablet Commonly known as: GLUCOPHAGE Stopped by: Elayne Snare, MD     TAKE these medications   Accu-Chek Guide Me w/Device Kit 1 each by Does not apply route 2 (two) times daily. Use accu chek guide me device to check blood sugar twice daily.DX:E11.65   Accu-Chek Guide test strip Generic drug: glucose blood 1 each by Other route 2 (two) times daily. Use as instructed   Accu-Chek Softclix Lancets lancets Use bid   amLODipine 5 MG tablet Commonly known as: NORVASC Take 5 mg by mouth 2 (two) times daily. Take 1 tablets by mouth twice daily.   atorvastatin 20 MG tablet Commonly known as: LIPITOR Take 1 tablet (20 mg total) by mouth daily.   bisacodyl 5 MG EC tablet Commonly known as: DULCOLAX Take 5 mg by mouth daily.   niacin 500 MG tablet Commonly known as: SLO-NIACIN Take 500 mg by mouth at bedtime.   NovoLIN N ReliOn 100 UNIT/ML injection Generic drug: insulin NPH Human INJECT 8 UNITS SUBCUTANEOUSLY IN THE MORNING AND 12 UNITS IN THE EVENING   NovoLIN R ReliOn 100 units/mL injection Generic drug: insulin regular INJECT 8 UNITS SUBCUTANEOUSLY WITH BREAKFAST AND LUNCH, AND 16 UNITS WITH DINNER   omeprazole 40 MG capsule Commonly known as: PRILOSEC Take 40 mg by mouth daily.   valsartan-hydrochlorothiazide 160-12.5 MG tablet Commonly known as: DIOVAN-HCT Take 1 tablet by mouth daily. Take 1 tablet by mouth once daily.   Vitamin D3 125 MCG (5000 UT) Caps Take 5,000 Units by mouth.       Allergies: No Known Allergies  Past Medical History:  Diagnosis Date  . Chronic kidney disease    kidney stone  . Diabetes mellitus without complication ([place])   . Hypertension   . Stroke [facility])     Past Surgical History:  Procedure Laterality Date  . BREAST BIOPSY Left   . CATARACT EXTRACTION Bilateral 2018   Dr. Tommy Rainwater  . HAND  SURGERY    . NO PAST SURGERIES      History reviewed. No pertinent family history.  Social History:  reports that she  has quit smoking. She has never used smokeless tobacco. She reports that she does not drink alcohol and does not use drugs.  Review of Systems:   HYPERTENSION:  she is on valsartan HCTZ and amlodipine Followed by PCP  HYPERLIPIDEMIA:  Taking Lipitor and niacin 500 mg, Niacin has been prescribed by her PCP She is now taking 20 mg of atorvastatin   LDL history as follows  Risk factors: May have had a history of stroke  Lab Results  Component Value Date   CHOL 142 04/21/2019   CHOL 158 12/13/2018   CHOL 164 04/26/2017   Lab Results  Component Value Date   HDL 52.20 04/21/2019   HDL 39.70 12/13/2018   HDL 54.10 04/26/2017   Lab Results  Component Value Date   LDLCALC 78 04/21/2019   [place] 100 (H) 12/13/2018   [place] 99 04/26/2017   Lab Results  Component Value Date   TRIG 57.0 04/21/2019   TRIG 93.0 12/13/2018   TRIG 58.0 04/26/2017   Lab Results  Component Value Date   CHOLHDL 3 04/21/2019   CHOLHDL 4 12/13/2018   CHOLHDL 3 04/26/2017   No results found for: LDLDIRECT  Last diabetic foot exam in 6/21 No symptoms of numbness or tingling in the feet No claudication on walking  She had diabetic eye exam in 10/2018 with only a few microaneurysms present       Examination:   BP 122/70 (BP Location: Left Arm, Patient Position: Sitting, Cuff Size: Normal)   Pulse 71   Ht 6' (1.829 m)   Wt 190 lb 3.2 oz (86.3 kg)   SpO2 99%   BMI 25.80 kg/m   Body mass index is 25.8 kg/m.   Diabetic Foot Exam - Simple   Simple Foot Form Diabetic Foot exam was performed with the following findings: Yes   Visual Inspection No deformities, no ulcerations, no other skin breakdown bilaterally: Yes Sensation Testing Intact to touch and monofilament testing bilaterally: Yes Pulse Check Posterior Tibialis and Dorsalis pulse intact bilaterally:  Yes Comments       ASSESSMENT/ PLAN:   Diabetes type 2, nonobese on insulin  See history of present illness for detailed discussion of  current management, blood sugar patterns and problems identified  She is on insulin and Metformin  A1c is higher than expected at 7.3, previously 6.7 %    Her blood sugar may be higher from her stopping Metformin Also she is not having hypoglycemia with reducing NPH at night Although fasting readings are not being monitored much they have been as low as 74 Most of her monitoring after breakfast and not clear if she has any fluctuation of her blood sugars after lunch and dinner She is trying to take her regular insulin before eating instead of after and directed on her last visit  Recommendations: Trial of Metformin ER if tolerated starting with 500 mg and gradually working up to maximum tolerated dose or 3 tablets Otherwise she will let us know if she has any side effects with this She will bring her monitor for download when she is able to No change in insulin at this time Emphasized the need to check blood sugars after meals consistently Also make sure she takes her regular insulin 15 to 30 minutes before eating To call if she gets hypoglycemia again  HYPERTENSION: Well controlled on amlodipine and valsartan HCT  Renal function stable Potassium normal   Patient Instructions  Start taking Metformin 500 mg, 1 tablet with your  main meal for 5 days. Occasionally this may initially cause loose stools or nausea.  If  tolerating well after 5 days add a second Metformin tablet (500 mg)    Continue adding another tablet after 5 days days if no persistent nausea or diarrhea until reaching the maximum tolerated dose or the full dose of 3   Check blood sugars on waking up 3-4 days a week  Also check blood sugars about 2 hours after meals and do this after different meals by rotation  Recommended blood sugar levels on waking up are 90-130 and about  2 hours after meal is 130-160  Please bring your blood sugar monitor to each visit, thank you  Take Novolin R BEFORE MEALS        Elayne Snare 08/21/2019, 9:08 AM   Note: This office note was prepared with Dragon voice recognition system technology. Any transcriptional errors that result from this process are unintentional.

## 2019-08-22 ENCOUNTER — Ambulatory Visit: Payer: Medicare HMO | Admitting: Endocrinology

## 2019-08-27 DIAGNOSIS — H5210 Myopia, unspecified eye: Secondary | ICD-10-CM | POA: Diagnosis not present

## 2019-08-27 DIAGNOSIS — E783 Hyperchylomicronemia: Secondary | ICD-10-CM | POA: Diagnosis not present

## 2019-08-27 DIAGNOSIS — E1100 Type 2 diabetes mellitus with hyperosmolarity without nonketotic hyperglycemic-hyperosmolar coma (NKHHC): Secondary | ICD-10-CM | POA: Diagnosis not present

## 2019-08-27 DIAGNOSIS — Z794 Long term (current) use of insulin: Secondary | ICD-10-CM | POA: Diagnosis not present

## 2019-08-27 DIAGNOSIS — I10 Essential (primary) hypertension: Secondary | ICD-10-CM | POA: Diagnosis not present

## 2019-09-26 DIAGNOSIS — E783 Hyperchylomicronemia: Secondary | ICD-10-CM | POA: Diagnosis not present

## 2019-09-26 DIAGNOSIS — I10 Essential (primary) hypertension: Secondary | ICD-10-CM | POA: Diagnosis not present

## 2019-09-26 DIAGNOSIS — E1100 Type 2 diabetes mellitus with hyperosmolarity without nonketotic hyperglycemic-hyperosmolar coma (NKHHC): Secondary | ICD-10-CM | POA: Diagnosis not present

## 2019-09-26 DIAGNOSIS — Z794 Long term (current) use of insulin: Secondary | ICD-10-CM | POA: Diagnosis not present

## 2019-10-06 DIAGNOSIS — I10 Essential (primary) hypertension: Secondary | ICD-10-CM | POA: Diagnosis not present

## 2019-10-06 DIAGNOSIS — E1169 Type 2 diabetes mellitus with other specified complication: Secondary | ICD-10-CM | POA: Diagnosis not present

## 2019-10-06 DIAGNOSIS — M130 Polyarthritis, unspecified: Secondary | ICD-10-CM | POA: Diagnosis not present

## 2019-10-06 DIAGNOSIS — I6522 Occlusion and stenosis of left carotid artery: Secondary | ICD-10-CM | POA: Diagnosis not present

## 2019-10-15 DIAGNOSIS — Z1211 Encounter for screening for malignant neoplasm of colon: Secondary | ICD-10-CM | POA: Diagnosis not present

## 2019-10-15 DIAGNOSIS — Z1212 Encounter for screening for malignant neoplasm of rectum: Secondary | ICD-10-CM | POA: Diagnosis not present

## 2019-10-22 LAB — COLOGUARD: COLOGUARD: NEGATIVE

## 2019-10-28 DIAGNOSIS — E783 Hyperchylomicronemia: Secondary | ICD-10-CM | POA: Diagnosis not present

## 2019-10-28 DIAGNOSIS — E1100 Type 2 diabetes mellitus with hyperosmolarity without nonketotic hyperglycemic-hyperosmolar coma (NKHHC): Secondary | ICD-10-CM | POA: Diagnosis not present

## 2019-10-28 DIAGNOSIS — Z794 Long term (current) use of insulin: Secondary | ICD-10-CM | POA: Diagnosis not present

## 2019-10-28 DIAGNOSIS — I10 Essential (primary) hypertension: Secondary | ICD-10-CM | POA: Diagnosis not present

## 2019-11-04 ENCOUNTER — Other Ambulatory Visit: Payer: Self-pay

## 2019-11-04 ENCOUNTER — Other Ambulatory Visit (INDEPENDENT_AMBULATORY_CARE_PROVIDER_SITE_OTHER): Payer: Medicare HMO

## 2019-11-04 DIAGNOSIS — E1165 Type 2 diabetes mellitus with hyperglycemia: Secondary | ICD-10-CM

## 2019-11-04 DIAGNOSIS — Z794 Long term (current) use of insulin: Secondary | ICD-10-CM | POA: Diagnosis not present

## 2019-11-04 LAB — BASIC METABOLIC PANEL
BUN: 11 mg/dL (ref 6–23)
CO2: 29 mEq/L (ref 19–32)
Calcium: 9.6 mg/dL (ref 8.4–10.5)
Chloride: 106 mEq/L (ref 96–112)
Creatinine, Ser: 0.69 mg/dL (ref 0.40–1.20)
GFR: 98.85 mL/min (ref >60.00)
Glucose, Bld: 116 mg/dL — ABNORMAL HIGH (ref 70–99)
Potassium: 3.9 mEq/L (ref 3.5–5.1)
Sodium: 143 mEq/L (ref 135–145)

## 2019-11-05 LAB — FRUCTOSAMINE: Fructosamine: 286 umol/L — ABNORMAL HIGH (ref 0–285)

## 2019-11-05 NOTE — Progress Notes (Signed)
Patient ID: Sandra Washington, female   DOB: 08/05/1940, 79 y.o.   MRN: [id number]   Reason for Appointment: Diabetes follow-up   History of Present Illness   Diagnosis: Type 2 DIABETES MELITUS, date of diagnosis:   1995       She has been on insulin almost since her diagnosis and has had various regimens over the years More recently has been on basal bolus insulin with Lantus and Humalog with variable control, depending on her compliance with diet and exercise She was subsequently changed to NPH and regular insulin because of cost  Recent history:  Insulin regimen: Novolin N insulin 8 units a.m.-- 10 units at bedtime,  Novolin R 10-05-14 ac tid   Oral hypoglycemic drugs:none     Side effects from medications:  Diarrhea from Metformin  Her A1c is in the last visit higher at 7.3    Current blood sugar patterns, management and problems:  She did bring her monitor for download, previously had not done so  She is mostly checking her blood sugars at noontime and difficult to know when she is not having any high sugars  She does have a few significantly high readings over 200 which she thinks is from going off her diet or eating too much fruits like watermelon, sometimes fried food  Previously had gained weight and this has leveled off  She could not tolerate even 500 mg of Metformin ER that was recommended on the last visit  She was also told to take her premeal insulin before eating instead of after which she was doing, she thinks she is remembering this most of the time  She is trying to do a little walking almost every day  Fasting glucose in the lab was 116; she does eat breakfast at 6 AM   Monitors blood glucose: Once a day or less .    Glucometer:  Accu-Chek  Blood Glucose readings and averages   PRE-MEAL Fasting Lunch Dinner Bedtime Overall  Glucose range: ?   76-122  212, 223   Mean/median:   98    133   POST-MEAL PC Breakfast PC Lunch PC Dinner   Glucose range:   71-245   Mean/median:      Previous readings:  PRE-MEAL Fasting Lunch Dinner Bedtime Overall  Glucose range: 97-117      Mean/median:        POST-MEAL PC Breakfast PC Lunch PC Dinner  Glucose range:   Up to 200  140-180,  Mean/median:             Meals: 3 meals per day.  breakfast is usually at 6-7 AM: Cereal or egg/toast.  Lunch  12:30pm             Dietician visit: Most recent: 03/2008           Wt Readings from Last 3 Encounters:  11/06/19 191 lb 9.6 oz (86.9 kg)  08/21/19 190 lb 3.2 oz (86.3 kg)  04/24/19 185 lb 3.2 oz (84 kg)    Lab Results  Component Value Date   HGBA1C 7.3 (H) 08/15/2019   HGBA1C 6.6 (H) 04/21/2019   HGBA1C 6.7 (H) 12/13/2018   Lab Results  Component Value Date   MICROALBUR <0.7 04/21/2019   LDLCALC 78 04/21/2019   CREATININE 0.69 11/04/2019    OTHER problems discussed today: See review of systems   Lab on 11/04/2019  Component Date Value Ref Range Status  . Sodium 11/04/2019 143  135 -  145 mEq/L Final  . Potassium 11/04/2019 3.9  3.5 - 5.1 mEq/L Final  . Chloride 11/04/2019 106  96 - 112 mEq/L Final  . CO2 11/04/2019 29  19 - 32 mEq/L Final  . Glucose, Bld 11/04/2019 116* 70 - 99 mg/dL Final  . BUN 11/04/2019 11  6 - 23 mg/dL Final  . Creatinine, Ser 11/04/2019 0.69  0.40 - 1.20 mg/dL Final  . GFR 11/04/2019 98.85  >60.00 mL/min Final  . Calcium 11/04/2019 9.6  8.4 - 10.5 mg/dL Final  . Fructosamine 11/04/2019 286* 0 - 285 umol/L Final   Comment: Published reference interval for apparently healthy subjects between age 47 and 90 is 61 - 285 umol/L and in a poorly controlled diabetic population is 228 - 563 umol/L with a mean of 396 umol/L.     Allergies as of 11/06/2019   No Known Allergies     Medication List       Accurate as of November 06, 2019  8:33 AM. If you have any questions, ask your nurse or doctor.        Accu-Chek Guide Me w/Device Kit 1 each by Does not apply route 2 (two) times daily. Use  accu chek guide me device to check blood sugar twice daily.DX:E11.65   Accu-Chek Guide test strip Generic drug: glucose blood 1 each by Other route 2 (two) times daily. Use as instructed   Accu-Chek Softclix Lancets lancets Use bid   amLODipine 5 MG tablet Commonly known as: NORVASC Take 5 mg by mouth 2 (two) times daily. Take 1 tablets by mouth twice daily.   atorvastatin 20 MG tablet Commonly known as: LIPITOR Take 1 tablet (20 mg total) by mouth daily.   bisacodyl 5 MG EC tablet Commonly known as: DULCOLAX Take 5 mg by mouth daily.   clotrimazole-betamethasone cream Commonly known as: LOTRISONE Apply 1 application topically 2 (two) times daily.   metFORMIN 500 MG 24 hr tablet Commonly known as: GLUCOPHAGE-XR Take 3 tablets (1,500 mg total) by mouth daily with supper.   niacin 500 MG tablet Commonly known as: SLO-NIACIN Take 500 mg by mouth at bedtime.   NovoLIN N ReliOn 100 UNIT/ML injection Generic drug: insulin NPH Human INJECT 8 UNITS SUBCUTANEOUSLY IN THE MORNING AND 12 UNITS IN THE EVENING   NovoLIN R ReliOn 100 units/mL injection Generic drug: insulin regular INJECT 8 UNITS SUBCUTANEOUSLY WITH BREAKFAST AND LUNCH, AND 16 UNITS WITH DINNER   omeprazole 40 MG capsule Commonly known as: PRILOSEC Take 40 mg by mouth daily.   valsartan-hydrochlorothiazide 160-12.5 MG tablet Commonly known as: DIOVAN-HCT Take 1 tablet by mouth daily. Take 1 tablet by mouth once daily.   Vitamin D3 125 MCG (5000 UT) Caps Take 5,000 Units by mouth.       Allergies: No Known Allergies  Past Medical History:  Diagnosis Date  . Chronic kidney disease    kidney stone  . Diabetes mellitus without complication ([place])   . Hypertension   . Stroke [facility])     Past Surgical History:  Procedure Laterality Date  . BREAST BIOPSY Left   . CATARACT EXTRACTION Bilateral 2018   Dr. Tommy Rainwater  . HAND SURGERY    . NO PAST SURGERIES      No family history on file.  Social History:   reports that she has quit smoking. She has never used smokeless tobacco. She reports that she does not drink alcohol and does not use drugs.  Review of Systems:   HYPERTENSION:  she  is on valsartan HCTZ and amlodipine Followed by PCP also  HYPERLIPIDEMIA:  Taking Lipitor and niacin 500 mg, Niacin has been prescribed by her PCP She is taking 20 mg of atorvastatin   LDL history as follows  Risk factors: May have had a history of stroke  Lab Results  Component Value Date   CHOL 142 04/21/2019   CHOL 158 12/13/2018   CHOL 164 04/26/2017   Lab Results  Component Value Date   HDL 52.20 04/21/2019   HDL 39.70 12/13/2018   HDL 54.10 04/26/2017   Lab Results  Component Value Date   LDLCALC 78 04/21/2019   [place] 100 (H) 12/13/2018   [place] 99 04/26/2017   Lab Results  Component Value Date   TRIG 57.0 04/21/2019   TRIG 93.0 12/13/2018   TRIG 58.0 04/26/2017   Lab Results  Component Value Date   CHOLHDL 3 04/21/2019   CHOLHDL 4 12/13/2018   CHOLHDL 3 04/26/2017   No results found for: LDLDIRECT  Last diabetic foot exam in 6/21  No symptoms of numbness or tingling in the feet No claudication on walking  She had diabetic eye exam in 6/21 with only a few microaneurysms present   Examination:   BP (!) 130/58 (BP Location: Left Arm, Patient Position: Sitting, Cuff Size: Normal)   Pulse 66   Wt 191 lb 9.6 oz (86.9 kg)   SpO2 99%   BMI 25.99 kg/m   Body mass index is 25.99 kg/m.      ASSESSMENT/ PLAN:   Diabetes type 2, nonobese on insulin  See history of present illness for detailed discussion of  current management, blood sugar patterns and problems identified  She is on insulin using basal bolus generic insulin preparations Metformin had to be stopped because of diarrhea  A1c was previously higher with stopping Metformin but not clear if her sugars are still high since she is only monitoring blood sugars at lunchtime Occasionally will have a high reading  over 200 based on her diet Fasting lab glucose was okay but she does not monitor at home Also not hypoglycemia   Recommendations: She will stay on insulin alone However discussed importance of checking readings after meals and occasionally in the morning fasting also to help adjust her mealtime dose If she is eating more foods or fried food she will need to increase her regular insulin 2 to 4 units Also continue to try and take insulin at mealtime before eating Regular walking  HYPERTENSION: Well controlled on amlodipine and valsartan HCT   There are no Patient Instructions on file for this visit.     Elayne Snare 11/06/2019, 8:33 AM   Note: This office note was prepared with Dragon voice recognition system technology. Any transcriptional errors that result from this process are unintentional.

## 2019-11-06 ENCOUNTER — Encounter: Payer: Self-pay | Admitting: Endocrinology

## 2019-11-06 ENCOUNTER — Other Ambulatory Visit: Payer: Self-pay

## 2019-11-06 ENCOUNTER — Ambulatory Visit: Payer: Medicare HMO | Admitting: Endocrinology

## 2019-11-06 VITALS — BP 130/58 | HR 66 | Wt 191.6 lb

## 2019-11-06 DIAGNOSIS — E1165 Type 2 diabetes mellitus with hyperglycemia: Secondary | ICD-10-CM

## 2019-11-06 DIAGNOSIS — Z794 Long term (current) use of insulin: Secondary | ICD-10-CM | POA: Diagnosis not present

## 2019-11-06 DIAGNOSIS — E7800 Pure hypercholesterolemia, unspecified: Secondary | ICD-10-CM | POA: Diagnosis not present

## 2019-11-06 MED ORDER — INSULIN NPH (HUMAN) (ISOPHANE) 100 UNIT/ML ~~LOC~~ SUSP: SUBCUTANEOUS | 5 refills | Status: AC

## 2019-11-06 MED ORDER — INSULIN REGULAR HUMAN 100 UNIT/ML IJ SOLN: INTRAMUSCULAR | 5 refills | Status: AC

## 2019-11-06 NOTE — Patient Instructions (Addendum)
Check blood sugars on waking up 3 days a week ° °Also check blood sugars about 2 hours after meals and do this after different meals by rotation ° °Recommended blood sugar levels on waking up are 90-130 and about 2 hours after meal is 130-180 ° °Please bring your blood sugar monitor to each visit, thank you ° °

## 2019-11-27 DIAGNOSIS — Z7984 Long term (current) use of oral hypoglycemic drugs: Secondary | ICD-10-CM | POA: Diagnosis not present

## 2019-11-27 DIAGNOSIS — E1100 Type 2 diabetes mellitus with hyperosmolarity without nonketotic hyperglycemic-hyperosmolar coma (NKHHC): Secondary | ICD-10-CM | POA: Diagnosis not present

## 2019-11-27 DIAGNOSIS — I10 Essential (primary) hypertension: Secondary | ICD-10-CM | POA: Diagnosis not present

## 2019-12-27 DIAGNOSIS — Z794 Long term (current) use of insulin: Secondary | ICD-10-CM | POA: Diagnosis not present

## 2019-12-27 DIAGNOSIS — E783 Hyperchylomicronemia: Secondary | ICD-10-CM | POA: Diagnosis not present

## 2019-12-27 DIAGNOSIS — I10 Essential (primary) hypertension: Secondary | ICD-10-CM | POA: Diagnosis not present

## 2019-12-27 DIAGNOSIS — E1100 Type 2 diabetes mellitus with hyperosmolarity without nonketotic hyperglycemic-hyperosmolar coma (NKHHC): Secondary | ICD-10-CM | POA: Diagnosis not present

## 2019-12-30 ENCOUNTER — Other Ambulatory Visit (HOSPITAL_COMMUNITY): Payer: Self-pay | Admitting: Family Medicine

## 2019-12-30 DIAGNOSIS — Z1231 Encounter for screening mammogram for malignant neoplasm of breast: Secondary | ICD-10-CM

## 2020-01-08 ENCOUNTER — Other Ambulatory Visit: Payer: Self-pay

## 2020-01-08 ENCOUNTER — Other Ambulatory Visit (INDEPENDENT_AMBULATORY_CARE_PROVIDER_SITE_OTHER): Payer: Medicare HMO

## 2020-01-08 DIAGNOSIS — Z794 Long term (current) use of insulin: Secondary | ICD-10-CM | POA: Diagnosis not present

## 2020-01-08 DIAGNOSIS — E7800 Pure hypercholesterolemia, unspecified: Secondary | ICD-10-CM

## 2020-01-08 DIAGNOSIS — E1165 Type 2 diabetes mellitus with hyperglycemia: Secondary | ICD-10-CM | POA: Diagnosis not present

## 2020-01-08 LAB — LIPID PANEL
Cholesterol: 138 mg/dL (ref 0–200)
HDL: 56.5 mg/dL (ref >39.00)
LDL Cholesterol: 72 mg/dL (ref 0–99)
NonHDL: 81.8
Total CHOL/HDL Ratio: 2
Triglycerides: 48 mg/dL (ref 0.0–149.0)
VLDL: 9.6 mg/dL (ref 0.0–40.0)

## 2020-01-08 LAB — BASIC METABOLIC PANEL
BUN: 13 mg/dL (ref 6–23)
CO2: 29 mEq/L (ref 19–32)
Calcium: 9.3 mg/dL (ref 8.4–10.5)
Chloride: 107 mEq/L (ref 96–112)
Creatinine, Ser: 0.71 mg/dL (ref 0.40–1.20)
GFR: 79.98 mL/min (ref >60.00)
Glucose, Bld: 95 mg/dL (ref 70–99)
Potassium: 4.2 mEq/L (ref 3.5–5.1)
Sodium: 141 mEq/L (ref 135–145)

## 2020-01-08 LAB — HEMOGLOBIN A1C: Hgb A1c MFr Bld: 6.9 % — ABNORMAL HIGH (ref 4.6–6.5)

## 2020-01-13 ENCOUNTER — Ambulatory Visit: Payer: Medicare HMO | Admitting: Endocrinology

## 2020-01-15 ENCOUNTER — Other Ambulatory Visit: Payer: Self-pay

## 2020-01-15 ENCOUNTER — Ambulatory Visit: Payer: Medicare HMO | Admitting: Endocrinology

## 2020-01-15 VITALS — BP 138/68 | HR 68 | Ht 67.25 in | Wt 192.0 lb

## 2020-01-15 DIAGNOSIS — E113299 Type 2 diabetes mellitus with mild nonproliferative diabetic retinopathy without macular edema, unspecified eye: Secondary | ICD-10-CM | POA: Diagnosis not present

## 2020-01-15 DIAGNOSIS — E119 Type 2 diabetes mellitus without complications: Secondary | ICD-10-CM | POA: Diagnosis not present

## 2020-01-15 DIAGNOSIS — I10 Essential (primary) hypertension: Secondary | ICD-10-CM

## 2020-01-15 NOTE — Progress Notes (Signed)
Patient ID: Sandra Washington, female   DOB: 09-26-1940, 79 y.o.   MRN: [id number]   Reason for Appointment: Diabetes follow-up   History of Present Illness   Diagnosis: Type 2 DIABETES MELITUS, date of diagnosis:   1995       She has been on insulin almost since her diagnosis and has had various regimens over the years More recently has been on basal bolus insulin with Lantus and Humalog with variable control, depending on her compliance with diet and exercise She was subsequently changed to NPH and regular insulin because of cost  Recent history:  Insulin regimen: Novolin N insulin 8 units a.m.-- 10 units at bedtime,  Novolin R 10-05-14 ac tid   Oral hypoglycemic drugs:none     Side effects from medications:  Diarrhea from Metformin  Her A1c is now 6.5 compared to 7.3   Current blood sugar patterns, management and problems:  She did bring her monitor for download, however checking blood sugars infrequently  Previously had stopped Metformin which she had difficulty tolerating even at 500 mg  She has only occasional blood sugars in the mornings, some after breakfast and also a few after dinner but none after lunch  Compared to previous visits her blood sugars recently are looking very consistently near normal  She did feel a little hypoglycemic after dinner with a blood sugar of 68, likely from a smaller meal  She is sometimes getting more carbohydrate at breakfast with cereal or oatmeal but all other days will have eggs and toast; however does not adjust her regular insulin based on her meals  Blood sugars after dinner are recently fairly stable and likely not getting as much fluid intake such as watermelons as before  She thinks she is trying to take her regular insulin before starting to eat as directed now  She is trying to be active taking care of her grandchild but not doing a lot of formal walking  Fasting glucose in the lab was 95 and also fairly good  at home   Monitors blood glucose: Once a day or less .    Glucometer:  Accu-Chek  Blood Glucose readings and averages   PRE-MEAL Fasting Lunch Dinner Bedtime Overall  Glucose range:  91, 117    68  Mean/median:        POST-MEAL PC Breakfast PC Lunch PC Dinner  Glucose range:  96-139   68-110  Mean/median:     \ Previously  PRE-MEAL Fasting Lunch Dinner Bedtime Overall  Glucose range: ?   76-122  212, 223   Mean/median:   98    133   POST-MEAL PC Breakfast PC Lunch PC Dinner  Glucose range:   71-245   Mean/median:              Meals: 3 meals per day.  breakfast is usually at 6-7 AM: Cereal or egg/toast.  Lunch  12:30pm             Dietician visit: Most recent: 03/2008           Wt Readings from Last 3 Encounters:  01/15/20 192 lb (87.1 kg)  11/06/19 191 lb 9.6 oz (86.9 kg)  08/21/19 190 lb 3.2 oz (86.3 kg)    Lab Results  Component Value Date   HGBA1C 6.9 (H) 01/08/2020   HGBA1C 7.3 (H) 08/15/2019   HGBA1C 6.6 (H) 04/21/2019   Lab Results  Component Value Date   MICROALBUR <0.7 04/21/2019   [place]  72 01/08/2020   CREATININE 0.71 01/08/2020    OTHER problems discussed today: See review of systems   No visits with results within 1 Week(s) from this visit.  Latest known visit with results is:  Lab on 01/08/2020  Component Date Value Ref Range Status  . Cholesterol 01/08/2020 138  0 - 200 mg/dL Final   ATP III Classification       Desirable:  < 200 mg/dL               Borderline High:  200 - 239 mg/dL          High:  > = 240 mg/dL  . Triglycerides 01/08/2020 48.0  0 - 149 mg/dL Final   Normal:  <150 mg/dLBorderline High:  150 - 199 mg/dL  . HDL 01/08/2020 56.50  >39.00 mg/dL Final  . VLDL 01/08/2020 9.6  0.0 - 40.0 mg/dL Final  . LDL Cholesterol 01/08/2020 72  0 - 99 mg/dL Final  . Total CHOL/HDL Ratio 01/08/2020 2   Final                  Men          Women1/2 Average Risk     3.4          3.3Average Risk          5.0          4.42X Average Risk           9.6          7.13X Average Risk          15.0          11.0                      . NonHDL 01/08/2020 81.80   Final   NOTE:  Non-HDL goal should be 30 mg/dL higher than patient's LDL goal (i.e. LDL goal of < 70 mg/dL, would have non-HDL goal of < 100 mg/dL)  . Sodium 01/08/2020 141  135 - 145 mEq/L Final  . Potassium 01/08/2020 4.2  3.5 - 5.1 mEq/L Final  . Chloride 01/08/2020 107  96 - 112 mEq/L Final  . CO2 01/08/2020 29  19 - 32 mEq/L Final  . Glucose, Bld 01/08/2020 95  70 - 99 mg/dL Final  . BUN 01/08/2020 13  6 - 23 mg/dL Final  . Creatinine, Ser 01/08/2020 0.71  0.40 - 1.20 mg/dL Final  . GFR 01/08/2020 79.98  >60.00 mL/min Final   Calculated using the CKD-EPI Creatinine Equation (2021)  . Calcium 01/08/2020 9.3  8.4 - 10.5 mg/dL Final  . Hgb A1c MFr Bld 01/08/2020 6.9* 4.6 - 6.5 % Final   Glycemic Control Guidelines for People with Diabetes:Non Diabetic:  <6%Goal of Therapy: <7%Additional Action Suggested:  >8%     Allergies as of 01/15/2020   No Known Allergies     Medication List       Accurate as of January 15, 2020  4:13 PM. If you have any questions, ask your nurse or doctor.        Accu-Chek Guide Me w/Device Kit 1 each by Does not apply route 2 (two) times daily. Use accu chek guide me device to check blood sugar twice daily.DX:E11.65   Accu-Chek Guide test strip Generic drug: glucose blood 1 each by Other route 2 (two) times daily. Use as instructed   Accu-Chek Softclix Lancets lancets Use bid   amLODipine 5 MG tablet  Commonly known as: NORVASC Take 5 mg by mouth 2 (two) times daily. Take 1 tablets by mouth twice daily.   atorvastatin 20 MG tablet Commonly known as: LIPITOR Take 1 tablet (20 mg total) by mouth daily.   bisacodyl 5 MG EC tablet Commonly known as: DULCOLAX Take 5 mg by mouth daily.   clotrimazole-betamethasone cream Commonly known as: LOTRISONE Apply 1 application topically 2 (two) times daily.   insulin NPH Human 100 UNIT/ML  injection Commonly known as: NovoLIN N ReliOn 8 U in am and 12 U hs   insulin regular 100 units/mL injection Commonly known as: NovoLIN R ReliOn INJECT 8 UNITS SUBCUTANEOUSLY WITH BREAKFAST AND LUNCH, AND 16 UNITS WITH DINNER   niacin 500 MG tablet Commonly known as: SLO-NIACIN Take 500 mg by mouth at bedtime.   omeprazole 40 MG capsule Commonly known as: PRILOSEC Take 40 mg by mouth daily.   valsartan-hydrochlorothiazide 160-12.5 MG tablet Commonly known as: DIOVAN-HCT Take 1 tablet by mouth daily. Take 1 tablet by mouth once daily.   Vitamin D3 125 MCG (5000 UT) Caps Take 5,000 Units by mouth.       Allergies: No Known Allergies  Past Medical History:  Diagnosis Date  . Chronic kidney disease    kidney stone  . Diabetes mellitus without complication ([place])   . Hypertension   . Stroke [facility])     Past Surgical History:  Procedure Laterality Date  . BREAST BIOPSY Left   . CATARACT EXTRACTION Bilateral 2018   Dr. Tommy Rainwater  . HAND SURGERY    . NO PAST SURGERIES      No family history on file.  Social History:  reports that she has quit smoking. She has never used smokeless tobacco. She reports that she does not drink alcohol and does not use drugs.  Review of Systems:   HYPERTENSION:  she is on valsartan HCTZ and amlodipine Followed by PCP also She thinks her blood pressure is higher today because of driving in traffic, blood pressure was checked twice  BP Readings from Last 3 Encounters:  01/15/20 (!) 142/68  11/06/19 (!) 130/58  08/21/19 122/70     HYPERLIPIDEMIA:  Taking Lipitor and niacin 500 mg, Niacin has been prescribed by her PCP She is taking 20 mg of atorvastatin   LDL well controlled  Risk factors: May have had a history of stroke  Lab Results  Component Value Date   CHOL 138 01/08/2020   CHOL 142 04/21/2019   CHOL 158 12/13/2018   Lab Results  Component Value Date   HDL 56.50 01/08/2020   HDL 52.20 04/21/2019   HDL 39.70 12/13/2018    Lab Results  Component Value Date   LDLCALC 72 01/08/2020   LDLCALC 78 04/21/2019   LDLCALC 100 (H) 12/13/2018   Lab Results  Component Value Date   TRIG 48.0 01/08/2020   TRIG 57.0 04/21/2019   TRIG 93.0 12/13/2018   Lab Results  Component Value Date   CHOLHDL 2 01/08/2020   CHOLHDL 3 04/21/2019   CHOLHDL 4 12/13/2018   No results found for: LDLDIRECT  Last diabetic foot exam in 6/21  No symptoms of numbness or tingling in the feet No claudication on walking  She had diabetic eye exam in 6/21 with only a few microaneurysms present   Examination:   BP (!) 142/68   Pulse 68   Ht 5' 7.25" (1.708 m)   Wt 192 lb (87.1 kg)   SpO2 99%   BMI 29.85 kg/m  Body mass index is 29.85 kg/m.      ASSESSMENT/ PLAN:   Diabetes type 2, nonobese on insulin  See history of present illness for detailed discussion of  current management, blood sugar patterns and problems identified  She is on insulin using basal bolus generic NPH and regular preparations  Even without taking Metformin or other non-insulin hypoglycemic drugs her blood sugars are excellent now A1c is down to 6.5  Recently most of her sugars are doing well and likely she is watching her diet and avoiding high carbohydrate foods and a lot of fruits  Recommendations: She can now cut back on her mealtime insulin as follows At breakfast if eating eggs and toast she can take 6 units If eating oatmeal and cereal she will take 8 units In the evening at dinnertime she can try taking only 14 units of insulin instead of 16 No change in NPH  HYPERTENSION: She is on amlodipine and valsartan HCT Blood pressure high normal for her today but may be related to anxiety and she can continue follow-up with PCP   There are no Patient Instructions on file for this visit.     Elayne Snare 01/15/2020, 4:13 PM   Note: This office note was prepared with Dragon voice recognition system technology. Any transcriptional errors  that result from this process are unintentional.

## 2020-01-15 NOTE — Patient Instructions (Signed)
R insulin take 6 in am if eating egg/toast in am

## 2020-01-27 DIAGNOSIS — I10 Essential (primary) hypertension: Secondary | ICD-10-CM | POA: Diagnosis not present

## 2020-01-27 DIAGNOSIS — E1100 Type 2 diabetes mellitus with hyperosmolarity without nonketotic hyperglycemic-hyperosmolar coma (NKHHC): Secondary | ICD-10-CM | POA: Diagnosis not present

## 2020-01-27 DIAGNOSIS — E783 Hyperchylomicronemia: Secondary | ICD-10-CM | POA: Diagnosis not present

## 2020-01-27 DIAGNOSIS — Z794 Long term (current) use of insulin: Secondary | ICD-10-CM | POA: Diagnosis not present

## 2020-02-11 ENCOUNTER — Ambulatory Visit (HOSPITAL_COMMUNITY)
Admission: RE | Admit: 2020-02-11 | Discharge: 2020-02-11 | Disposition: A | Discharge status: Home / Self Care | Payer: Medicare HMO | Source: Ambulatory Visit | Attending: Family Medicine | Admitting: Family Medicine

## 2020-02-11 ENCOUNTER — Other Ambulatory Visit: Payer: Self-pay

## 2020-02-11 DIAGNOSIS — Z1231 Encounter for screening mammogram for malignant neoplasm of breast: Secondary | ICD-10-CM | POA: Insufficient documentation

## 2020-02-19 DIAGNOSIS — I10 Essential (primary) hypertension: Secondary | ICD-10-CM | POA: Diagnosis not present

## 2020-02-19 DIAGNOSIS — I6529 Occlusion and stenosis of unspecified carotid artery: Secondary | ICD-10-CM | POA: Diagnosis not present

## 2020-02-19 DIAGNOSIS — E1169 Type 2 diabetes mellitus with other specified complication: Secondary | ICD-10-CM | POA: Diagnosis not present

## 2020-02-19 DIAGNOSIS — E782 Mixed hyperlipidemia: Secondary | ICD-10-CM | POA: Diagnosis not present

## 2020-02-27 DIAGNOSIS — I10 Essential (primary) hypertension: Secondary | ICD-10-CM | POA: Diagnosis not present

## 2020-02-27 DIAGNOSIS — E783 Hyperchylomicronemia: Secondary | ICD-10-CM | POA: Diagnosis not present

## 2020-02-27 DIAGNOSIS — Z794 Long term (current) use of insulin: Secondary | ICD-10-CM | POA: Diagnosis not present

## 2020-02-27 DIAGNOSIS — E1100 Type 2 diabetes mellitus with hyperosmolarity without nonketotic hyperglycemic-hyperosmolar coma (NKHHC): Secondary | ICD-10-CM | POA: Diagnosis not present

## 2020-03-27 DIAGNOSIS — E1100 Type 2 diabetes mellitus with hyperosmolarity without nonketotic hyperglycemic-hyperosmolar coma (NKHHC): Secondary | ICD-10-CM | POA: Diagnosis not present

## 2020-03-27 DIAGNOSIS — Z794 Long term (current) use of insulin: Secondary | ICD-10-CM | POA: Diagnosis not present

## 2020-03-27 DIAGNOSIS — E783 Hyperchylomicronemia: Secondary | ICD-10-CM | POA: Diagnosis not present

## 2020-03-27 DIAGNOSIS — I10 Essential (primary) hypertension: Secondary | ICD-10-CM | POA: Diagnosis not present

## 2020-04-26 DIAGNOSIS — E783 Hyperchylomicronemia: Secondary | ICD-10-CM | POA: Diagnosis not present

## 2020-04-26 DIAGNOSIS — Z794 Long term (current) use of insulin: Secondary | ICD-10-CM | POA: Diagnosis not present

## 2020-04-26 DIAGNOSIS — E1100 Type 2 diabetes mellitus with hyperosmolarity without nonketotic hyperglycemic-hyperosmolar coma (NKHHC): Secondary | ICD-10-CM | POA: Diagnosis not present

## 2020-04-26 DIAGNOSIS — I10 Essential (primary) hypertension: Secondary | ICD-10-CM | POA: Diagnosis not present

## 2020-05-10 ENCOUNTER — Ambulatory Visit: Payer: Medicare HMO | Admitting: Endocrinology

## 2020-05-10 ENCOUNTER — Encounter: Payer: Self-pay | Admitting: Endocrinology

## 2020-05-10 ENCOUNTER — Other Ambulatory Visit: Payer: Self-pay

## 2020-05-10 VITALS — BP 138/62 | HR 72 | Ht 66.5 in | Wt 194.0 lb

## 2020-05-10 DIAGNOSIS — E7800 Pure hypercholesterolemia, unspecified: Secondary | ICD-10-CM

## 2020-05-10 DIAGNOSIS — I10 Essential (primary) hypertension: Secondary | ICD-10-CM | POA: Diagnosis not present

## 2020-05-10 DIAGNOSIS — Z794 Long term (current) use of insulin: Secondary | ICD-10-CM

## 2020-05-10 DIAGNOSIS — E1165 Type 2 diabetes mellitus with hyperglycemia: Secondary | ICD-10-CM

## 2020-05-10 LAB — COMPREHENSIVE METABOLIC PANEL
ALT: 13 U/L (ref 0–35)
AST: 15 U/L (ref 0–37)
Albumin: 4 g/dL (ref 3.5–5.2)
Alkaline Phosphatase: 61 U/L (ref 39–117)
BUN: 11 mg/dL (ref 6–23)
CO2: 29 mEq/L (ref 19–32)
Calcium: 9.6 mg/dL (ref 8.4–10.5)
Chloride: 105 mEq/L (ref 96–112)
Creatinine, Ser: 0.65 mg/dL (ref 0.40–1.20)
GFR: 82.63 mL/min (ref >60.00)
Glucose, Bld: 118 mg/dL — ABNORMAL HIGH (ref 70–99)
Potassium: 3.8 mEq/L (ref 3.5–5.1)
Sodium: 142 mEq/L (ref 135–145)
Total Bilirubin: 0.8 mg/dL (ref 0.2–1.2)
Total Protein: 6.9 g/dL (ref 6.0–8.3)

## 2020-05-10 LAB — GLUCOSE, POCT (MANUAL RESULT ENTRY): POC Glucose: 109 mg/dl — AB (ref 70–99)

## 2020-05-10 LAB — POCT GLYCOSYLATED HEMOGLOBIN (HGB A1C): Hemoglobin A1C: 6.4 % — AB (ref 4.0–5.6)

## 2020-05-10 LAB — TSH: TSH: 1.66 u[IU]/mL (ref 0.35–4.50)

## 2020-05-10 NOTE — Progress Notes (Signed)
Patient ID: Sandra Washington, female   DOB: [birth year], 80 y.o.   MRN: [id number]   Reason for Appointment: Diabetes follow-up   History of Present Illness   Diagnosis: Type 2 DIABETES MELITUS, date of diagnosis:   1995       She has been on insulin almost since her diagnosis and has had various regimens over the years More recently has been on basal bolus insulin with Lantus and Humalog with variable control, depending on her compliance with diet and exercise She was subsequently changed to NPH and regular insulin because of cost  Recent history:  Insulin regimen: Novolin N insulin 8 units a.m.-- 12 units at bedtime,  Novolin R 10-05-14 ac tid   Oral hypoglycemic drugs:none     Side effects from medications:  Diarrhea from Metformin  Her A1c is now 6.4 and progressively improved   Current blood sugar patterns, management and problems:  She did not bring her monitor for download, however she is usually checking blood sugars infrequently  Previously had been advised to take only 6 units at breakfast for regular insulin after eating a higher protein meal such as eggs but she is still doing the same thing  Also she is still taking 12 units NPH at bedtime instead of 10 units that was recommended because of the way the prescription is written  She thinks he may feel a little hypoglycemic at lunchtime but only a sensation of slight weakness or hunger  According to recall her blood sugars are fairly normal throughout the day but not clear how often she checks after meals  She thinks she is trying to eat smaller meals at lunch and more vegetables and overall still eating healthy meals   She is trying to walk indoors or do chair exercises but planning to start going to the water exercises  Fasting glucose today in the office is 109   Monitors blood glucose: Once a day or less .    Glucometer:  Accu-Chek  Blood Glucose readings    PRE-MEAL Fasting Lunch Dinner  Bedtime Overall  Glucose range: 80-110      Mean/median:     ?   POST-MEAL PC Breakfast PC Lunch PC Dinner  Glucose range:  110-120 130  Mean/median:      Prior   PRE-MEAL Fasting Lunch Dinner Bedtime Overall  Glucose range:  91, 117    68  Mean/median:        POST-MEAL PC Breakfast PC Lunch PC Dinner  Glucose range:  96-139   68-110  Mean/median:             Meals: 3 meals per day.  breakfast is usually at 6-7 AM: Cereal or egg/toast.  Lunch  12:30pm             Dietician visit: Most recent: 03/2008           Wt Readings from Last 3 Encounters:  05/10/20 194 lb (88 kg)  01/15/20 192 lb (87.1 kg)  11/06/19 191 lb 9.6 oz (86.9 kg)    Lab Results  Component Value Date   HGBA1C 6.4 (A) 05/10/2020   HGBA1C 6.9 (H) 01/08/2020   HGBA1C 7.3 (H) 08/15/2019   Lab Results  Component Value Date   MICROALBUR <0.7 04/21/2019   LDLCALC 72 01/08/2020   CREATININE 0.71 01/08/2020    OTHER problems discussed today: See review of systems   Office Visit on 05/10/2020  Component Date Value Ref Range Status  .  Hemoglobin A1C 05/10/2020 6.4* 4.0 - 5.6 % Final  . POC Glucose 05/10/2020 109* 70 - 99 mg/dl Final    Allergies as of 05/10/2020   No Known Allergies     Medication List       Accurate as of May 10, 2020  9:37 AM. If you have any questions, ask your nurse or doctor.        Accu-Chek Guide Me w/Device Kit 1 each by Does not apply route 2 (two) times daily. Use accu chek guide me device to check blood sugar twice daily.DX:E11.65   Accu-Chek Guide test strip Generic drug: glucose blood 1 each by Other route 2 (two) times daily. Use as instructed   Accu-Chek Softclix Lancets lancets Use bid   amLODipine 5 MG tablet Commonly known as: NORVASC Take 5 mg by mouth 2 (two) times daily. Take 1 tablets by mouth twice daily.   atorvastatin 20 MG tablet Commonly known as: LIPITOR Take 1 tablet (20 mg total) by mouth daily.   bisacodyl 5 MG EC tablet Commonly  known as: DULCOLAX Take 5 mg by mouth daily.   clotrimazole-betamethasone cream Commonly known as: LOTRISONE Apply 1 application topically 2 (two) times daily.   insulin NPH Human 100 UNIT/ML injection Commonly known as: NovoLIN N ReliOn 8 U in am and 12 U hs   insulin regular 100 units/mL injection Commonly known as: NovoLIN R ReliOn INJECT 8 UNITS SUBCUTANEOUSLY WITH BREAKFAST AND LUNCH, AND 16 UNITS WITH DINNER   niacin 500 MG tablet Commonly known as: SLO-NIACIN Take 500 mg by mouth at bedtime.   omeprazole 40 MG capsule Commonly known as: PRILOSEC Take 40 mg by mouth daily.   valsartan-hydrochlorothiazide 160-12.5 MG tablet Commonly known as: DIOVAN-HCT Take 1 tablet by mouth daily. Take 1 tablet by mouth once daily.   Vitamin D3 125 MCG (5000 UT) Caps Take 5,000 Units by mouth.       Allergies: No Known Allergies  Past Medical History:  Diagnosis Date  . Chronic kidney disease    kidney stone  . Diabetes mellitus without complication (HCC)   . Hypertension   . Stroke (HCC)     Past Surgical History:  Procedure Laterality Date  . BREAST BIOPSY Left   . CATARACT EXTRACTION Bilateral 2018   Dr. Beavis  . HAND SURGERY    . NO PAST SURGERIES      No family history on file.  Social History:  reports that she has quit smoking. She has never used smokeless tobacco. She reports that she does not drink alcohol and does not use drugs.  Review of Systems:   HYPERTENSION:  she is on valsartan HCTZ and amlodipine Followed by PCP also No recent changes made in medications by PCP No history of microalbuminuria, does need follow-up  BP Readings from Last 3 Encounters:  05/10/20 138/62  01/15/20 138/68  11/06/19 (!) 130/58     HYPERLIPIDEMIA:  Taking 20 mg atorvastatin and niacin 500 mg, Niacin has been prescribed by her PCP Last labs normal  Risk factors: May have had a history of stroke  Lab Results  Component Value Date   CHOL 138 01/08/2020    CHOL 142 04/21/2019   CHOL 158 12/13/2018   Lab Results  Component Value Date   HDL 56.50 01/08/2020   HDL 52.20 04/21/2019   HDL 39.70 12/13/2018   Lab Results  Component Value Date   LDLCALC 72 01/08/2020   LDLCALC 78 04/21/2019   LDLCALC 100 (H) 12/13/2018     Lab Results  Component Value Date   TRIG 48.0 01/08/2020   TRIG 57.0 04/21/2019   TRIG 93.0 12/13/2018   Lab Results  Component Value Date   CHOLHDL 2 01/08/2020   CHOLHDL 3 04/21/2019   CHOLHDL 4 12/13/2018   No results found for: LDLDIRECT  Last diabetic foot exam in 6/21  No symptoms of numbness or tingling in the feet  She had diabetic eye exam in 6/21 with only a few microaneurysms present   Examination:   BP 138/62   Pulse 72   Ht 5' 6.5" (1.689 m)   Wt 194 lb (88 kg)   SpO2 99%   BMI 30.84 kg/m   Body mass index is 30.84 kg/m.      ASSESSMENT/ PLAN:   Diabetes type 2, nonobese on insulin  See history of present illness for detailed discussion of  current management, blood sugar patterns and problems identified  She is on insulin using basal bolus generic NPH and regular preparations  A1c is excellent at 6.4  Unable to download her meter However she may be having low normal readings on waking up and before lunchtime Likely taking more insulin than needed including NPH and also morning regular when she is not eating a carbohydrate rich meal  Recommendations: Reduce NPH by 2 units twice daily, try to follow written instructions instead of previous prescription  At breakfast if eating eggs and toast she can take 6 units If eating oatmeal and cereal she will take 8 units Try to check blood sugars after meals especially after breakfast more consistently and bring monitor for the download on each visit Check urine microalbumin  HYPERTENSION: She is on amlodipine and valsartan HCT Blood pressure is well controlled, adequate considering her age    Patient Instructions  Reduce N insulin  to 6 in am and 10 at bedtime  Take 6 R in am for eggs in am  Check blood sugars on waking up 3-4  days a week  Also check blood sugars about 2 hours after meals and do this after different meals by rotation  Recommended blood sugar levels on waking up are 90-130 and about 2 hours after meal is 130-160  Please bring your blood sugar monitor to each visit, thank you        Ajay Kumar 05/10/2020, 9:37 AM   Note: This office note was prepared with Dragon voice recognition system technology. Any transcriptional errors that result from this process are unintentional.  

## 2020-05-10 NOTE — Patient Instructions (Addendum)
Reduce N insulin to 6 in am and 10 at bedtime  Take 6 R in am for eggs in am  Check blood sugars on waking up 3-4  days a week  Also check blood sugars about 2 hours after meals and do this after different meals by rotation  Recommended blood sugar levels on waking up are 90-130 and about 2 hours after meal is 130-160  Please bring your blood sugar monitor to each visit, thank you

## 2020-05-10 NOTE — Addendum Note (Signed)
Addended by: Kaylyn Lim I on: 05/10/2020 09:47 AM   Modules accepted: Orders

## 2020-05-20 ENCOUNTER — Ambulatory Visit: Payer: Medicare HMO | Admitting: Endocrinology

## 2020-05-27 DIAGNOSIS — Z794 Long term (current) use of insulin: Secondary | ICD-10-CM | POA: Diagnosis not present

## 2020-05-27 DIAGNOSIS — I10 Essential (primary) hypertension: Secondary | ICD-10-CM | POA: Diagnosis not present

## 2020-05-27 DIAGNOSIS — E1100 Type 2 diabetes mellitus with hyperosmolarity without nonketotic hyperglycemic-hyperosmolar coma (NKHHC): Secondary | ICD-10-CM | POA: Diagnosis not present

## 2020-06-24 DIAGNOSIS — I10 Essential (primary) hypertension: Secondary | ICD-10-CM | POA: Diagnosis not present

## 2020-06-24 DIAGNOSIS — Z0001 Encounter for general adult medical examination with abnormal findings: Secondary | ICD-10-CM | POA: Diagnosis not present

## 2020-06-24 DIAGNOSIS — Z794 Long term (current) use of insulin: Secondary | ICD-10-CM | POA: Diagnosis not present

## 2020-06-24 DIAGNOSIS — M130 Polyarthritis, unspecified: Secondary | ICD-10-CM | POA: Diagnosis not present

## 2020-06-24 DIAGNOSIS — E1169 Type 2 diabetes mellitus with other specified complication: Secondary | ICD-10-CM | POA: Diagnosis not present

## 2020-06-26 DIAGNOSIS — Z794 Long term (current) use of insulin: Secondary | ICD-10-CM | POA: Diagnosis not present

## 2020-06-26 DIAGNOSIS — I10 Essential (primary) hypertension: Secondary | ICD-10-CM | POA: Diagnosis not present

## 2020-06-26 DIAGNOSIS — E1100 Type 2 diabetes mellitus with hyperosmolarity without nonketotic hyperglycemic-hyperosmolar coma (NKHHC): Secondary | ICD-10-CM | POA: Diagnosis not present

## 2020-06-29 ENCOUNTER — Inpatient Hospital Stay (HOSPITAL_COMMUNITY)
Admission: EM | Admit: 2020-06-29 | Discharge: 2020-07-03 | DRG: 418 | Disposition: A | Discharge status: Home / Self Care | Payer: Medicare HMO | Attending: Internal Medicine | Admitting: Internal Medicine

## 2020-06-29 ENCOUNTER — Encounter (HOSPITAL_COMMUNITY): Payer: Self-pay | Admitting: Emergency Medicine

## 2020-06-29 ENCOUNTER — Other Ambulatory Visit: Payer: Self-pay

## 2020-06-29 DIAGNOSIS — E1169 Type 2 diabetes mellitus with other specified complication: Secondary | ICD-10-CM

## 2020-06-29 DIAGNOSIS — E669 Obesity, unspecified: Secondary | ICD-10-CM | POA: Diagnosis present

## 2020-06-29 DIAGNOSIS — M545 Low back pain, unspecified: Secondary | ICD-10-CM | POA: Diagnosis not present

## 2020-06-29 DIAGNOSIS — K810 Acute cholecystitis: Secondary | ICD-10-CM | POA: Diagnosis present

## 2020-06-29 DIAGNOSIS — E11649 Type 2 diabetes mellitus with hypoglycemia without coma: Secondary | ICD-10-CM | POA: Diagnosis not present

## 2020-06-29 DIAGNOSIS — I129 Hypertensive chronic kidney disease with stage 1 through stage 4 chronic kidney disease, or unspecified chronic kidney disease: Secondary | ICD-10-CM | POA: Diagnosis present

## 2020-06-29 DIAGNOSIS — E1165 Type 2 diabetes mellitus with hyperglycemia: Secondary | ICD-10-CM | POA: Diagnosis not present

## 2020-06-29 DIAGNOSIS — E871 Hypo-osmolality and hyponatremia: Secondary | ICD-10-CM | POA: Diagnosis not present

## 2020-06-29 DIAGNOSIS — K8000 Calculus of gallbladder with acute cholecystitis without obstruction: Principal | ICD-10-CM | POA: Diagnosis present

## 2020-06-29 DIAGNOSIS — Z20822 Contact with and (suspected) exposure to covid-19: Secondary | ICD-10-CM | POA: Diagnosis present

## 2020-06-29 DIAGNOSIS — Z87891 Personal history of nicotine dependence: Secondary | ICD-10-CM | POA: Diagnosis not present

## 2020-06-29 DIAGNOSIS — R52 Pain, unspecified: Secondary | ICD-10-CM

## 2020-06-29 DIAGNOSIS — K8020 Calculus of gallbladder without cholecystitis without obstruction: Secondary | ICD-10-CM | POA: Diagnosis not present

## 2020-06-29 DIAGNOSIS — E785 Hyperlipidemia, unspecified: Secondary | ICD-10-CM | POA: Diagnosis not present

## 2020-06-29 DIAGNOSIS — Z6830 Body mass index (BMI) 30.0-30.9, adult: Secondary | ICD-10-CM | POA: Diagnosis not present

## 2020-06-29 DIAGNOSIS — E1122 Type 2 diabetes mellitus with diabetic chronic kidney disease: Secondary | ICD-10-CM | POA: Diagnosis present

## 2020-06-29 DIAGNOSIS — K82A1 Gangrene of gallbladder in cholecystitis: Secondary | ICD-10-CM | POA: Diagnosis not present

## 2020-06-29 DIAGNOSIS — R1011 Right upper quadrant pain: Secondary | ICD-10-CM | POA: Diagnosis not present

## 2020-06-29 DIAGNOSIS — N1831 Chronic kidney disease, stage 3a: Secondary | ICD-10-CM | POA: Diagnosis present

## 2020-06-29 DIAGNOSIS — Z794 Long term (current) use of insulin: Secondary | ICD-10-CM

## 2020-06-29 DIAGNOSIS — D6489 Other specified anemias: Secondary | ICD-10-CM | POA: Diagnosis present

## 2020-06-29 DIAGNOSIS — Z79899 Other long term (current) drug therapy: Secondary | ICD-10-CM

## 2020-06-29 DIAGNOSIS — E876 Hypokalemia: Secondary | ICD-10-CM | POA: Diagnosis present

## 2020-06-29 DIAGNOSIS — Z8673 Personal history of transient ischemic attack (TIA), and cerebral infarction without residual deficits: Secondary | ICD-10-CM | POA: Diagnosis not present

## 2020-06-29 DIAGNOSIS — K219 Gastro-esophageal reflux disease without esophagitis: Secondary | ICD-10-CM | POA: Diagnosis present

## 2020-06-29 DIAGNOSIS — D649 Anemia, unspecified: Secondary | ICD-10-CM | POA: Diagnosis not present

## 2020-06-29 DIAGNOSIS — R110 Nausea: Secondary | ICD-10-CM

## 2020-06-29 DIAGNOSIS — I10 Essential (primary) hypertension: Secondary | ICD-10-CM | POA: Diagnosis not present

## 2020-06-29 DIAGNOSIS — D72829 Elevated white blood cell count, unspecified: Secondary | ICD-10-CM | POA: Diagnosis not present

## 2020-06-29 DIAGNOSIS — R109 Unspecified abdominal pain: Secondary | ICD-10-CM | POA: Diagnosis not present

## 2020-06-29 LAB — CBG MONITORING, ED: Glucose-Capillary: 278 mg/dL — ABNORMAL HIGH (ref 70–99)

## 2020-06-29 MED ORDER — ONDANSETRON HCL 4 MG/2ML IJ SOLN
4.0000 mg | Freq: Once | INTRAMUSCULAR | Status: AC | PRN
  Administered 2020-06-30: 4 mg via INTRAVENOUS
  Filled 2020-06-29: qty 2

## 2020-06-29 NOTE — ED Triage Notes (Signed)
Pt c/o abd pain with n/v; also c/o right flank and back pain from a fall on Fri

## 2020-06-30 ENCOUNTER — Encounter (HOSPITAL_COMMUNITY): Payer: Self-pay | Admitting: Family Medicine

## 2020-06-30 ENCOUNTER — Emergency Department (HOSPITAL_COMMUNITY): Payer: Medicare HMO

## 2020-06-30 ENCOUNTER — Inpatient Hospital Stay (HOSPITAL_COMMUNITY): Payer: Medicare HMO

## 2020-06-30 DIAGNOSIS — E1169 Type 2 diabetes mellitus with other specified complication: Secondary | ICD-10-CM

## 2020-06-30 DIAGNOSIS — K810 Acute cholecystitis: Secondary | ICD-10-CM | POA: Diagnosis not present

## 2020-06-30 DIAGNOSIS — M545 Low back pain, unspecified: Secondary | ICD-10-CM | POA: Diagnosis not present

## 2020-06-30 DIAGNOSIS — R109 Unspecified abdominal pain: Secondary | ICD-10-CM | POA: Diagnosis not present

## 2020-06-30 DIAGNOSIS — E669 Obesity, unspecified: Secondary | ICD-10-CM

## 2020-06-30 DIAGNOSIS — E785 Hyperlipidemia, unspecified: Secondary | ICD-10-CM

## 2020-06-30 DIAGNOSIS — I10 Essential (primary) hypertension: Secondary | ICD-10-CM

## 2020-06-30 DIAGNOSIS — K8020 Calculus of gallbladder without cholecystitis without obstruction: Secondary | ICD-10-CM | POA: Diagnosis not present

## 2020-06-30 LAB — COMPREHENSIVE METABOLIC PANEL
ALT: 17 U/L (ref 0–44)
AST: 15 U/L (ref 15–41)
Albumin: 4.1 g/dL (ref 3.5–5.0)
Alkaline Phosphatase: 70 U/L (ref 38–126)
Anion gap: 9 (ref 5–15)
BUN: 14 mg/dL (ref 8–23)
CO2: 25 mmol/L (ref 22–32)
Calcium: 9.6 mg/dL (ref 8.9–10.3)
Chloride: 103 mmol/L (ref 98–111)
Creatinine, Ser: 0.75 mg/dL (ref 0.44–1.00)
GFR, Estimated: >60 mL/min (ref >60)
Glucose, Bld: 306 mg/dL — ABNORMAL HIGH (ref 70–99)
Potassium: 3.6 mmol/L (ref 3.5–5.1)
Sodium: 137 mmol/L (ref 135–145)
Total Bilirubin: 1 mg/dL (ref 0.3–1.2)
Total Protein: 7.2 g/dL (ref 6.5–8.1)

## 2020-06-30 LAB — URINALYSIS, ROUTINE W REFLEX MICROSCOPIC
Bacteria, UA: NONE SEEN
Bilirubin Urine: NEGATIVE
Glucose, UA: >=500 mg/dL — AB
Ketones, ur: 20 mg/dL — AB
Leukocytes,Ua: NEGATIVE
Nitrite: NEGATIVE
Protein, ur: 30 mg/dL — AB
Specific Gravity, Urine: 1.023 (ref 1.005–1.030)
pH: 7 (ref 5.0–8.0)

## 2020-06-30 LAB — CBC
HCT: 40.4 % (ref 36.0–46.0)
Hemoglobin: 12.5 g/dL (ref 12.0–15.0)
MCH: 27.2 pg (ref 26.0–34.0)
MCHC: 30.9 g/dL (ref 30.0–36.0)
MCV: 87.8 fL (ref 80.0–100.0)
Platelets: 236 10*3/uL (ref 150–400)
RBC: 4.6 MIL/uL (ref 3.87–5.11)
RDW: 13.6 % (ref 11.5–15.5)
WBC: 11.2 10*3/uL — ABNORMAL HIGH (ref 4.0–10.5)
nRBC: 0 % (ref 0.0–0.2)

## 2020-06-30 LAB — CBG MONITORING, ED: Glucose-Capillary: 272 mg/dL — ABNORMAL HIGH (ref 70–99)

## 2020-06-30 LAB — GLUCOSE, CAPILLARY
Glucose-Capillary: 248 mg/dL — ABNORMAL HIGH (ref 70–99)
Glucose-Capillary: 259 mg/dL — ABNORMAL HIGH (ref 70–99)

## 2020-06-30 LAB — LIPASE, BLOOD: Lipase: 27 U/L (ref 11–51)

## 2020-06-30 LAB — SARS CORONAVIRUS 2 (TAT 6-24 HRS): SARS Coronavirus 2: NEGATIVE

## 2020-06-30 MED ORDER — MORPHINE SULFATE (PF) 4 MG/ML IV SOLN
4.0000 mg | Freq: Once | INTRAVENOUS | Status: AC
  Administered 2020-06-30: 4 mg via INTRAVENOUS
  Filled 2020-06-30: qty 1

## 2020-06-30 MED ORDER — SODIUM CHLORIDE 0.9 % IV BOLUS
500.0000 mL | Freq: Once | INTRAVENOUS | Status: AC
  Administered 2020-06-30: 500 mL via INTRAVENOUS

## 2020-06-30 MED ORDER — ACETAMINOPHEN 650 MG RE SUPP: 650.0000 mg | Freq: Four times a day (QID) | RECTAL | Status: DC | PRN

## 2020-06-30 MED ORDER — CHLORHEXIDINE GLUCONATE CLOTH 2 % EX PADS
6.0000 | MEDICATED_PAD | Freq: Once | CUTANEOUS | Status: AC
  Administered 2020-06-30: 6 via TOPICAL

## 2020-06-30 MED ORDER — HEPARIN SODIUM (PORCINE) 5000 UNIT/ML IJ SOLN
5000.0000 [IU] | Freq: Three times a day (TID) | INTRAMUSCULAR | Status: DC
  Administered 2020-06-30 – 2020-07-01 (×4): 5000 [IU] via SUBCUTANEOUS
  Filled 2020-06-30 (×4): qty 1

## 2020-06-30 MED ORDER — ONDANSETRON HCL 4 MG/2ML IJ SOLN
4.0000 mg | Freq: Four times a day (QID) | INTRAMUSCULAR | Status: DC | PRN
Start: 2020-06-30 — End: 2020-07-03

## 2020-06-30 MED ORDER — INSULIN DETEMIR 100 UNIT/ML ~~LOC~~ SOLN
10.0000 [IU] | Freq: Every day | SUBCUTANEOUS | Status: DC
  Administered 2020-06-30: 10 [IU] via SUBCUTANEOUS
  Filled 2020-06-30 (×2): qty 0.1

## 2020-06-30 MED ORDER — ATORVASTATIN CALCIUM 20 MG PO TABS
20.0000 mg | ORAL_TABLET | Freq: Every day | ORAL | Status: DC
  Administered 2020-06-30 – 2020-07-03 (×3): 20 mg via ORAL
  Filled 2020-06-30: qty 1
  Filled 2020-06-30: qty 2
  Filled 2020-06-30: qty 1

## 2020-06-30 MED ORDER — INSULIN ASPART 100 UNIT/ML IJ SOLN
0.0000 [IU] | Freq: Every day | INTRAMUSCULAR | Status: DC
Start: 2020-06-30 — End: 2020-07-03
  Administered 2020-06-30: 3 [IU] via SUBCUTANEOUS

## 2020-06-30 MED ORDER — HYDROCHLOROTHIAZIDE 12.5 MG PO CAPS
12.5000 mg | ORAL_CAPSULE | Freq: Every day | ORAL | Status: DC
  Administered 2020-06-30: 12.5 mg via ORAL
  Filled 2020-06-30: qty 1

## 2020-06-30 MED ORDER — SODIUM CHLORIDE 0.9 % IV SOLN: INTRAVENOUS | Status: DC

## 2020-06-30 MED ORDER — OXYCODONE HCL 5 MG PO TABS
5.0000 mg | ORAL_TABLET | ORAL | Status: DC | PRN
  Administered 2020-06-30: 5 mg via ORAL
  Filled 2020-06-30: qty 1

## 2020-06-30 MED ORDER — IOHEXOL 300 MG/ML  SOLN
100.0000 mL | Freq: Once | INTRAMUSCULAR | Status: AC | PRN
  Administered 2020-06-30: 100 mL via INTRAVENOUS

## 2020-06-30 MED ORDER — VALSARTAN-HYDROCHLOROTHIAZIDE 160-12.5 MG PO TABS: 1.0000 | ORAL_TABLET | Freq: Every day | ORAL | Status: DC

## 2020-06-30 MED ORDER — PIPERACILLIN-TAZOBACTAM 3.375 G IVPB 30 MIN: 3.3750 g | Freq: Three times a day (TID) | INTRAVENOUS | Status: DC

## 2020-06-30 MED ORDER — AMLODIPINE BESYLATE 5 MG PO TABS
5.0000 mg | ORAL_TABLET | Freq: Two times a day (BID) | ORAL | Status: DC
  Administered 2020-06-30 – 2020-07-03 (×6): 5 mg via ORAL
  Filled 2020-06-30 (×6): qty 1

## 2020-06-30 MED ORDER — IRBESARTAN 150 MG PO TABS
150.0000 mg | ORAL_TABLET | Freq: Every day | ORAL | Status: DC
  Administered 2020-06-30 – 2020-07-03 (×3): 150 mg via ORAL
  Filled 2020-06-30 (×3): qty 1

## 2020-06-30 MED ORDER — INSULIN ASPART 100 UNIT/ML IJ SOLN
0.0000 [IU] | Freq: Three times a day (TID) | INTRAMUSCULAR | Status: DC
  Administered 2020-06-30: 5 [IU] via SUBCUTANEOUS
  Administered 2020-06-30: 8 [IU] via SUBCUTANEOUS
  Administered 2020-07-01: 5 [IU] via SUBCUTANEOUS
  Administered 2020-07-01: 8 [IU] via SUBCUTANEOUS
  Administered 2020-07-02: 5 [IU] via SUBCUTANEOUS
  Administered 2020-07-02: 3 [IU] via SUBCUTANEOUS
  Administered 2020-07-03: 2 [IU] via SUBCUTANEOUS
  Filled 2020-06-30: qty 1

## 2020-06-30 MED ORDER — HYDROMORPHONE HCL 1 MG/ML IJ SOLN
1.0000 mg | Freq: Once | INTRAMUSCULAR | Status: AC
  Administered 2020-06-30: 1 mg via INTRAVENOUS
  Filled 2020-06-30: qty 1

## 2020-06-30 MED ORDER — ACETAMINOPHEN 325 MG PO TABS
650.0000 mg | ORAL_TABLET | Freq: Four times a day (QID) | ORAL | Status: DC | PRN
  Administered 2020-06-30: 650 mg via ORAL
  Filled 2020-06-30: qty 2

## 2020-06-30 MED ORDER — PANTOPRAZOLE SODIUM 40 MG IV SOLR
40.0000 mg | INTRAVENOUS | Status: DC
  Administered 2020-06-30 – 2020-07-02 (×3): 40 mg via INTRAVENOUS
  Filled 2020-06-30 (×3): qty 40

## 2020-06-30 MED ORDER — SODIUM CHLORIDE 0.9 % IV SOLN
2.0000 g | INTRAVENOUS | Status: DC
  Filled 2020-06-30: qty 2

## 2020-06-30 MED ORDER — MORPHINE SULFATE (PF) 2 MG/ML IV SOLN
2.0000 mg | INTRAVENOUS | Status: DC | PRN
  Administered 2020-06-30: 2 mg via INTRAVENOUS
  Filled 2020-06-30: qty 1

## 2020-06-30 MED ORDER — ONDANSETRON HCL 4 MG/2ML IJ SOLN
4.0000 mg | Freq: Once | INTRAMUSCULAR | Status: AC
  Administered 2020-06-30: 4 mg via INTRAVENOUS
  Filled 2020-06-30: qty 2

## 2020-06-30 MED ORDER — PIPERACILLIN-TAZOBACTAM 3.375 G IVPB
3.3750 g | Freq: Three times a day (TID) | INTRAVENOUS | Status: DC
  Administered 2020-06-30 – 2020-07-03 (×10): 3.375 g via INTRAVENOUS
  Filled 2020-06-30 (×10): qty 50

## 2020-06-30 MED ORDER — METOCLOPRAMIDE HCL 5 MG/ML IJ SOLN
5.0000 mg | Freq: Once | INTRAMUSCULAR | Status: AC
  Administered 2020-06-30: 5 mg via INTRAVENOUS
  Filled 2020-06-30: qty 2

## 2020-06-30 MED ORDER — ONDANSETRON HCL 4 MG PO TABS: 4.0000 mg | ORAL_TABLET | Freq: Four times a day (QID) | ORAL | Status: DC | PRN

## 2020-06-30 NOTE — H&P (View-Only) (Signed)
[facility] Consult  Reason for Consult: Acute cholecystitis  Referring Physician:  Dr. Alfredia Ferguson  Chief Complaint    Abdominal Pain      HPI: Sandra Washington is a 80 y.o. female with CKD, DM, HTN who comes in with abdominal pain, nausea / vomiting and some diarrhea that started Saturday and got worse on Tuesday. She says that she had fallen and thought the pain was related to that but then she ate some oysters and that she has had issues since that time. She was even worried about food poisoning but things continued. CT was done that questioned cholecystitis as well as as [place] that questions some thickening consistent with cholecystitis and stones.   Past Medical History:  Diagnosis Date  . Chronic kidney disease    kidney stone  . Diabetes mellitus without complication ([place])   . Hypertension   . Stroke [facility])     Past Surgical History:  Procedure Laterality Date  . BREAST BIOPSY Left   . CATARACT EXTRACTION Bilateral 2018   Dr. Tommy Rainwater  . HAND SURGERY    . NO PAST SURGERIES      History reviewed. No pertinent family history.  Social History   Tobacco Use  . Smoking status: Former Research scientist (life sciences)  . Smokeless tobacco: Never Used  Substance Use Topics  . Alcohol use: No  . Drug use: No    Medications:  I have reviewed the patient's current medications. Prior to Admission: (Not in a hospital admission)  Scheduled: . amLODipine  5 mg Oral BID  . atorvastatin  20 mg Oral Daily  . heparin  5,000 Units Subcutaneous Q8H  . insulin aspart  0-15 Units Subcutaneous TID WC  . insulin aspart  0-5 Units Subcutaneous QHS  . insulin detemir  10 Units Subcutaneous QHS  . irbesartan  150 mg Oral Daily  . pantoprazole (PROTONIX) IV  40 mg Intravenous Q24H   Continuous: . sodium chloride 75 mL/hr at 06/30/20 0956  . piperacillin-tazobactam (ZOSYN)  IV 3.375 g (06/30/20 1412)   [id number] **OR** acetaminophen, morphine injection, ondansetron **OR**  ondansetron (ZOFRAN) IV, oxyCODONE  No Known Allergies   ROS:  A comprehensive review of systems was negative except for: Gastrointestinal: positive for abdominal pain, diarrhea, nausea and vomiting  Blood pressure (!) 138/48, pulse 92, temperature 98.2 F (36.8 C), temperature source Oral, resp. rate 16, height 5\' 6"  (1.676 m), weight 87.1 kg, SpO2 94 %. Physical Exam Vitals reviewed.  Constitutional:      Appearance: She is well-developed.  HENT:     Head: Normocephalic.  Cardiovascular:     Rate and Rhythm: Normal rate.  Pulmonary:     Effort: Pulmonary effort is normal.     Breath sounds: Normal breath sounds.  Abdominal:     General: There is no distension.     Palpations: Abdomen is soft.     Tenderness: There is abdominal tenderness.     Hernia: No hernia is present.  Musculoskeletal:     Comments: Moves all extremities   Skin:    General: Skin is warm.  Neurological:     General: No focal deficit present.     Mental Status: She is alert and oriented to person, place, and time.  Psychiatric:        Mood and Affect: Mood normal.        Behavior: Behavior normal.     Results: Results for orders placed or performed during the hospital encounter of 06/29/20 (from the past  48 hour(s))  CBG monitoring, ED     Status: Abnormal   Collection Time: 06/29/20 11:04 PM  Result Value Ref Range   Glucose-Capillary 278 (H) 70 - 99 mg/dL    Comment: Glucose reference range applies only to samples taken after fasting for at least 8 hours.  Lipase, blood     Status: None   Collection Time: 06/30/20 12:14 AM  Result Value Ref Range   Lipase 27 11 - 51 U/L    Comment: Performed at [facility], [address], [place], [place] [address]  Comprehensive metabolic panel     Status: Abnormal   Collection Time: 06/30/20 12:14 AM  Result Value Ref Range   Sodium 137 135 - 145 mmol/L   Potassium 3.6 3.5 - 5.1 mmol/L   Chloride 103 98 - 111 mmol/L   CO2 25 22 - 32 mmol/L   Glucose,  Bld 306 (H) 70 - 99 mg/dL    Comment: Glucose reference range applies only to samples taken after fasting for at least 8 hours.   BUN 14 8 - 23 mg/dL   Creatinine, Ser 0.75 0.44 - 1.00 mg/dL   Calcium 9.6 8.9 - 10.3 mg/dL   Total Protein 7.2 6.5 - 8.1 g/dL   Albumin 4.1 3.5 - 5.0 g/dL   AST 15 15 - 41 U/L   ALT 17 0 - 44 U/L   Alkaline Phosphatase 70 38 - 126 U/L   Total Bilirubin 1.0 0.3 - 1.2 mg/dL   GFR, Estimated >60 >60 mL/min    Comment: (NOTE) Calculated using the CKD-EPI Creatinine Equation (2021)    Anion gap 9 5 - 15    Comment: Performed at [facility], [address] [place], [place] [address]  CBC     Status: Abnormal   Collection Time: 06/30/20 12:14 AM  Result Value Ref Range   WBC 11.2 (H) 4.0 - 10.5 K/uL   RBC 4.60 3.87 - 5.11 MIL/uL   Hemoglobin 12.5 12.0 - 15.0 g/dL   HCT 40.4 36.0 - 46.0 %   MCV 87.8 80.0 - 100.0 fL   MCH 27.2 26.0 - 34.0 pg   MCHC 30.9 30.0 - 36.0 g/dL   RDW 13.6 11.5 - 15.5 %   Platelets 236 150 - 400 K/uL   nRBC 0.0 0.0 - 0.2 %    Comment: Performed at [facility], [address], [place], [place] [address]  Urinalysis, Routine w reflex microscopic     Status: Abnormal   Collection Time: 06/30/20  3:40 AM  Result Value Ref Range   Color, Urine COLORLESS (A) YELLOW   APPearance CLEAR CLEAR   Specific Gravity, Urine 1.023 1.005 - 1.030   pH 7.0 5.0 - 8.0   Glucose, UA >=500 (A) NEGATIVE mg/dL   Hgb urine dipstick SMALL (A) NEGATIVE   Bilirubin Urine NEGATIVE NEGATIVE   Ketones, ur 20 (A) NEGATIVE mg/dL   Protein, ur 30 (A) NEGATIVE mg/dL   Nitrite NEGATIVE NEGATIVE   Leukocytes,Ua NEGATIVE NEGATIVE   RBC / HPF 0-5 0 - 5 RBC/hpf   WBC, UA 0-5 0 - 5 WBC/hpf   Bacteria, UA NONE SEEN NONE SEEN    Comment: Performed at [facility], [address], [place], [place] [address]  CBG monitoring, ED     Status: Abnormal   Collection Time: 06/30/20 11:59 AM  Result Value Ref Range   Glucose-Capillary 272 (H) 70 - 99 mg/dL    Comment:  Glucose reference range applies only to  samples taken after fasting for at least 8 hours.   Personally reviewed- thickened gallbladder, distended gallbladder, stones   CT Abdomen Pelvis W Contrast  Result Date: 06/30/2020 CLINICAL DATA:  Acute abdominal pain EXAM: CT ABDOMEN AND PELVIS WITH CONTRAST TECHNIQUE: Multidetector CT imaging of the abdomen and pelvis was performed using the standard protocol following bolus administration of intravenous contrast. CONTRAST:  120mL OMNIPAQUE IOHEXOL 300 MG/ML  SOLN COMPARISON:  04/09/2012 FINDINGS: LOWER CHEST: Normal. HEPATOBILIARY: Distended gallbladder with cholelithiasis. Mild intrahepatic biliary dilatation . no focal liver abnormality. Common bile duct is normal caliber. PANCREAS: Normal pancreas. No ductal dilatation or peripancreatic fluid collection. SPLEEN: Normal. ADRENALS/URINARY TRACT: The adrenal glands are normal. No hydronephrosis, nephroureterolithiasis or solid renal mass. The urinary bladder is normal for degree of distention STOMACH/BOWEL: No hiatal hernia. There is a diverticulum of the third segment of the duodenum. No small bowel dilatation or inflammation. No focal colonic abnormality. Normal appendix. VASCULAR/LYMPHATIC: There is calcific atherosclerosis of the abdominal aorta. No lymphadenopathy. REPRODUCTIVE: Multiple calcified uterine fibroids. MUSCULOSKELETAL. Multilevel degenerative disc disease and facet arthrosis. No bony spinal canal stenosis. OTHER: None. IMPRESSION: 1. Distended gallbladder with cholelithiasis and mild intrahepatic biliary dilatation. If there is concern for acute cholecystitis, consider right upper quadrant ultrasound. 2. Fibroid uterus. Aortic atherosclerosis (ICD10-I70.0). Electronically Signed   By: Ulyses Jarred M.D.   On: 06/30/2020 03:21   CT L-SPINE NO CHARGE  Result Date: 06/30/2020 CLINICAL DATA:  Right flank and back pain after fall EXAM: CT LUMBAR SPINE WITHOUT CONTRAST TECHNIQUE: Multidetector CT  imaging of the lumbar spine was performed without intravenous contrast administration. Multiplanar CT image reconstructions were also generated. COMPARISON:  None. FINDINGS: Segmentation: 5 lumbar type vertebrae. Alignment: Normal. Vertebrae: No acute fracture or focal pathologic process. Paraspinal and other soft tissues: Calcific aortic atherosclerosis. Uterine fibroids. Disc levels: There is mild spinal canal stenosis at L4-5. There is severe bilateral L5-S1 neural foraminal stenosis. IMPRESSION: 1. No acute fracture or static subluxation of the lumbar spine. 2. Severe bilateral L5-S1 neural foraminal stenosis. Aortic Atherosclerosis (ICD10-I70.0). Electronically Signed   By: Ulyses Jarred M.D.   On: 06/30/2020 02:57   US Abdomen Limited RUQ (LIVER/GB)  Result Date: 06/30/2020 CLINICAL DATA:  Upper abdominal pain EXAM: ULTRASOUND ABDOMEN LIMITED RIGHT UPPER QUADRANT COMPARISON:  CT abdomen and pelvis Jun 30, 2020 FINDINGS: Gallbladder: Within the gallbladder, there are echogenic foci which move and shadow consistent with cholelithiasis. Largest gallstone measures 9 mm in length. There is gallbladder wall thickening. There is also sludge in the gallbladder. No pericholecystic fluid. Patient is focally tender over the gallbladder. Common bile duct: Diameter: 2 mm. No evident intrahepatic or extrahepatic biliary duct dilatation. Liver: No focal lesion identified. Liver echogenicity overall increased. Portal vein is patent on color Doppler imaging with normal direction of blood flow towards the liver. Other: None. IMPRESSION: 1. Cholelithiasis and sludge in gallbladder with gallbladder wall thickening and focal tenderness over the gallbladder. These are findings raising concern for acute cholecystitis. 2. Increased liver echogenicity, a finding likely indicative of hepatic steatosis. No focal liver lesions evident. Note that sensitivity of ultrasound for detection of focal liver lesions is diminished in this  circumstance. Electronically Signed   By: Lowella Grip III M.D.   On: 06/30/2020 08:17     Assessment & Plan:  Sandra Washington is a 80 y.o. female with what looks like cholecystitis on imaging with RUQ pain.  -PLAN: I counseled the patient about the indication, risks and benefits of laparoscopic cholecystectomy.  She understands there is a very small chance for bleeding, infection, injury to normal structures (including common bile duct), conversion to open surgery, persistent symptoms, evolution of postcholecystectomy diarrhea, need for secondary interventions, anesthesia reaction, cardiopulmonary issues and other risks not specifically detailed here. I described the expected recovery, the plan for follow-up and the restrictions during the recovery phase.  All questions were answered.  -NPO at midnight, ok to have clears nwo -IV antibiotics for now  -COVID pending   All questions were answered to the satisfaction of the patient.    Virl Cagey 06/30/2020, 4:32 PM

## 2020-06-30 NOTE — H&P (Signed)
TRH H&P    Patient Demographics:    Sandra Washington, is a 80 y.o. female  MRN: [id number]  DOB - 04-27-1940  Admit Date - 06/29/2020  Referring MD/NP/PA: Dina Rich  Outpatient Primary MD for the patient is Lucianne Lei, MD  Patient coming from: Home  Chief complaint- N/v   HPI:    Sandra Washington  is a 80 y.o. female, with history of CVA, HTN, DMII, and CKD presents to the ED with a chief complaint of n/v. Patient reports that the symptoms started last week.  They have been intermittent and unpredictable.  They do not seem to be related to mealtime.  She reports that she has had her esophagus stretched in the past and she thought that she was just having symptoms from that.  Then she reports she had some oysters and she thought maybe she was just having some food poisoning.  And yesterday the symptoms came on with more intensity.  She had severe right upper quadrant pain that was sharp and pressure.  She has nausea and vomiting that appeared as undigested food.  She had diarrhea without blood.  She reports her last normal meal was dinner, and her appetite has been intact.  She had oatmeal toast, burger for lunch, and a salad for dinner.  She reports that the burger did not seem to affect her pain anymore than any the other foods which did not seem to have effect either.  Patient denies any fevers.  She reports that the active vomiting helped relieve the abdominal pain, but then it would come right back.  She denies other symptoms at this time.  Patient does not smoke, does not drink, does not use illicit drugs.  She is vaccinated for COVID.  She is full code.  In the ED Temp 98.5, heart rate 98, respiratory 17, blood pressure 132/87, satting at 98% Leukocytosis with white blood cell count of 11.2, hemoglobin 12.5 Chemistry panel is mostly unremarkable with a hyperglycemia of 306 CT abdomen pelvis shows  distended gallbladder with cholelithiasis and mild intrahepatic biliary dilatation and possibly acute cholecystitis Right upper quadrant ultrasound was recommended CT L-spine was also done that showed no acute fracture or static subluxation.  L5-S1 neural foraminal stenosis.  Patient was given Dilaudid, Reglan, morphine, Zofran, normal saline 500 mL bolus     Review of systems:    In addition to the HPI above,  No Fever-chills, No Headache, No changes with Vision or hearing, No problems swallowing food or Liquids, No Chest pain, Cough or Shortness of Breath, No Blood in stool or Urine, No dysuria, No new skin rashes or bruises, No new joints pains-aches,  No new weakness, tingling, numbness in any extremity, No recent weight gain or loss, No polyuria, polydypsia or polyphagia, No significant Mental Stressors.  All other systems reviewed and are negative.    Past History of the following :    Past Medical History:  Diagnosis Date  . Chronic kidney disease    kidney stone  . Diabetes mellitus without complication ([place])   .  Hypertension   . Stroke [facility])       Past Surgical History:  Procedure Laterality Date  . BREAST BIOPSY Left   . CATARACT EXTRACTION Bilateral 2018   Dr. Tommy Rainwater  . HAND SURGERY    . NO PAST SURGERIES        Social History:      Social History   Tobacco Use  . Smoking status: Former Research scientist (life sciences)  . Smokeless tobacco: Never Used  Substance Use Topics  . Alcohol use: No       Family History :    No family history on file. Family history hypertension   Home Medications:   Prior to Admission medications   Medication Sig Start Date End Date Taking? Authorizing Provider  ACCU-CHEK SOFTCLIX LANCETS lancets Use bid 05/01/17   Elayne Snare, MD  amLODipine (NORVASC) 5 MG tablet Take 5 mg by mouth 2 (two) times daily. Take 1 tablets by mouth twice daily. 10/19/14   [provider]  atorvastatin (LIPITOR) 20 MG tablet Take 1 tablet (20 mg  total) by mouth daily. 12/17/18   Elayne Snare, MD  bisacodyl (DULCOLAX) 5 MG EC tablet Take 5 mg by mouth daily.    [provider]  Blood Glucose Monitoring Suppl (ACCU-CHEK GUIDE ME) w/Device KIT 1 each by Does not apply route 2 (two) times daily. Use accu chek guide me device to check blood sugar twice daily.DX:E11.65 12/23/18   Elayne Snare, MD  Cholecalciferol (VITAMIN D3) 5000 UNITS CAPS Take 5,000 Units by mouth.    [provider]  clotrimazole-betamethasone (LOTRISONE) cream Apply 1 application topically 2 (two) times daily.    [provider]  glucose blood (ACCU-CHEK GUIDE) test strip 1 each by Other route 2 (two) times daily. Use as instructed 12/17/18   Elayne Snare, MD  insulin NPH Human (NOVOLIN N RELION) 100 UNIT/ML injection 8 U in am and 12 U hs 11/06/19   Elayne Snare, MD  insulin regular (NOVOLIN R RELION) 100 units/mL injection INJECT 8 UNITS SUBCUTANEOUSLY WITH BREAKFAST AND LUNCH, AND 16 UNITS WITH DINNER 11/06/19   Elayne Snare, MD  niacin (SLO-NIACIN) 500 MG tablet Take 500 mg by mouth at bedtime.    [provider]  omeprazole (PRILOSEC) 40 MG capsule Take 40 mg by mouth daily.    [provider]  valsartan-hydrochlorothiazide (DIOVAN-HCT) 160-12.5 MG tablet Take 1 tablet by mouth daily. Take 1 tablet by mouth once daily.    [provider]     Allergies:    No Known Allergies   Physical Exam:   Vitals  Blood pressure 132/87, pulse 98, temperature 98.5 F (36.9 C), temperature source Oral, resp. rate 17, height _0  (1.676 m), weight 87.1 kg, SpO2 98 %.  1.  General: Patient lying supine in bed in no acute distress  2. Psychiatric: Mood and behavior normal for situation, patient is alert and oriented x3, pleasant and cooperative with exam  3. Neurologic: Face is symmetric, speech and language are normal, moves all 4 extremities voluntarily, no acute deficit on limited exam  4. HEENMT:  Head is atraumatic,  normocephalic, pupils are reactive to light, neck is supple, trachea is midline, mucous membranes are moist  5. Respiratory : Lungs are clear to auscultation bilaterally without wheezes, rhonchi, rales, no increased work of breathing  6. Cardiovascular : Heart rate is normal, rhythm is regular, no murmurs rubs or gallops, no JVD  7. Gastrointestinal:  Abdomen is soft, mildly tender in the right upper quadrant,  nondistended, no palpable masses, bowel sounds normal  8. Skin:  Skin is warm dry and intact without acute lesion on limited exam  9.Musculoskeletal:  No calf tenderness, no acute deformity, peripheral pulses palpated    Data Review:    CBC Recent Labs  Lab 06/30/20 0014  WBC 11.2*  HGB 12.5  HCT 40.4  PLT 236  MCV 87.8  MCH 27.2  MCHC 30.9  RDW 13.6   ------------------------------------------------------------------------------------------------------------------  Results for orders placed or performed during the hospital encounter of 06/29/20 (from the past 48 hour(s))  CBG monitoring, ED     Status: Abnormal   Collection Time: 06/29/20 11:04 PM  Result Value Ref Range   Glucose-Capillary 278 (H) 70 - 99 mg/dL    Comment: Glucose reference range applies only to samples taken after fasting for at least 8 hours.  Lipase, blood     Status: None   Collection Time: 06/30/20 12:14 AM  Result Value Ref Range   Lipase 27 11 - 51 U/L    Comment: Performed at [facility], [address] [place], [place] [address]  Comprehensive metabolic panel     Status: Abnormal   Collection Time: 06/30/20 12:14 AM  Result Value Ref Range   Sodium 137 135 - 145 mmol/L   Potassium 3.6 3.5 - 5.1 mmol/L   Chloride 103 98 - 111 mmol/L   CO2 25 22 - 32 mmol/L   Glucose, Bld 306 (H) 70 - 99 mg/dL    Comment: Glucose reference range applies only to samples taken after fasting for at least 8 hours.   BUN 14 8 - 23 mg/dL   Creatinine, Ser 0.75 0.44 - 1.00 mg/dL   Calcium 9.6 8.9 -  10.3 mg/dL   Total Protein 7.2 6.5 - 8.1 g/dL   Albumin 4.1 3.5 - 5.0 g/dL   AST 15 15 - 41 U/L   ALT 17 0 - 44 U/L   Alkaline Phosphatase 70 38 - 126 U/L   Total Bilirubin 1.0 0.3 - 1.2 mg/dL   GFR, Estimated >60 >60 mL/min    Comment: (NOTE) Calculated using the CKD-EPI Creatinine Equation (2021)    Anion gap 9 5 - 15    Comment: Performed at [facility], [address] [place], [place] [address]  CBC     Status: Abnormal   Collection Time: 06/30/20 12:14 AM  Result Value Ref Range   WBC 11.2 (H) 4.0 - 10.5 K/uL   RBC 4.60 3.87 - 5.11 MIL/uL   Hemoglobin 12.5 12.0 - 15.0 g/dL   HCT 40.4 36.0 - 46.0 %   MCV 87.8 80.0 - 100.0 fL   MCH 27.2 26.0 - 34.0 pg   MCHC 30.9 30.0 - 36.0 g/dL   RDW 13.6 11.5 - 15.5 %   Platelets 236 150 - 400 K/uL   nRBC 0.0 0.0 - 0.2 %    Comment: Performed at [facility], [address] [place], [place] [address]  Urinalysis, Routine w reflex microscopic     Status: Abnormal   Collection Time: 06/30/20  3:40 AM  Result Value Ref Range   Color, Urine COLORLESS (A) YELLOW   APPearance CLEAR CLEAR   Specific Gravity, Urine 1.023 1.005 - 1.030   pH 7.0 5.0 - 8.0   Glucose, UA >=500 (A) NEGATIVE mg/dL   Hgb urine dipstick SMALL (A) NEGATIVE   Bilirubin Urine NEGATIVE NEGATIVE   Ketones, ur 20 (A) NEGATIVE mg/dL   Protein, ur 30 (A) NEGATIVE mg/dL  Nitrite NEGATIVE NEGATIVE   Leukocytes,Ua NEGATIVE NEGATIVE   RBC / HPF 0-5 0 - 5 RBC/hpf   WBC, UA 0-5 0 - 5 WBC/hpf   Bacteria, UA NONE SEEN NONE SEEN    Comment: Performed at [facility], [address], [place], [place] [address]    Chemistries  Recent Labs  Lab 06/30/20 0014  NA 137  K 3.6  CL 103  CO2 25  GLUCOSE 306*  BUN 14  CREATININE 0.75  CALCIUM 9.6  AST 15  ALT 17  ALKPHOS 70  BILITOT 1.0    ------------------------------------------------------------------------------------------------------------------  ------------------------------------------------------------------------------------------------------------------ GFR: Estimated Creatinine Clearance: 61.3 mL/min (by C-G formula based on SCr of 0.75 mg/dL). Liver Function Tests: Recent Labs  Lab 06/30/20 0014  AST 15  ALT 17  ALKPHOS 70  BILITOT 1.0  PROT 7.2  ALBUMIN 4.1   Recent Labs  Lab 06/30/20 0014  LIPASE 27   No results for input(s): AMMONIA in the last 168 hours. Coagulation Profile: No results for input(s): INR, PROTIME in the last 168 hours. Cardiac Enzymes: No results for input(s): CKTOTAL, CKMB, CKMBINDEX, TROPONINI in the last 168 hours. BNP (last 3 results) No results for input(s): PROBNP in the last 8760 hours. HbA1C: No results for input(s): HGBA1C in the last 72 hours. CBG: Recent Labs  Lab 06/29/20 2304  GLUCAP 278*   Lipid Profile: No results for input(s): CHOL, HDL, LDLCALC, TRIG, CHOLHDL, LDLDIRECT in the last 72 hours. Thyroid Function Tests: No results for input(s): TSH, T4TOTAL, FREET4, T3FREE, THYROIDAB in the last 72 hours. Anemia Panel: No results for input(s): VITAMINB12, FOLATE, FERRITIN, TIBC, IRON, RETICCTPCT in the last 72 hours.  --------------------------------------------------------------------------------------------------------------- Urine analysis:    Component Value Date/Time   COLORURINE COLORLESS (A) 06/30/2020 0340   APPEARANCEUR CLEAR 06/30/2020 0340   LABSPEC 1.023 06/30/2020 0340   PHURINE 7.0 06/30/2020 0340   GLUCOSEU >=500 (A) 06/30/2020 0340   GLUCOSEU NEGATIVE 10/19/2015 0834   HGBUR SMALL (A) 06/30/2020 0340   BILIRUBINUR NEGATIVE 06/30/2020 0340   KETONESUR 20 (A) 06/30/2020 0340   PROTEINUR 30 (A) 06/30/2020 0340   UROBILINOGEN 0.2 10/19/2015 0834   NITRITE NEGATIVE 06/30/2020 0340   LEUKOCYTESUR NEGATIVE 06/30/2020 0340       Imaging Results:    CT Abdomen Pelvis W Contrast  Result Date: 06/30/2020 CLINICAL DATA:  Acute abdominal pain EXAM: CT ABDOMEN AND PELVIS WITH CONTRAST TECHNIQUE: Multidetector CT imaging of the abdomen and pelvis was performed using the standard protocol following bolus administration of intravenous contrast. CONTRAST:  19m OMNIPAQUE IOHEXOL 300 MG/ML  SOLN COMPARISON:  04/09/2012 FINDINGS: LOWER CHEST: Normal. HEPATOBILIARY: Distended gallbladder with cholelithiasis. Mild intrahepatic biliary dilatation . no focal liver abnormality. Common bile duct is normal caliber. PANCREAS: Normal pancreas. No ductal dilatation or peripancreatic fluid collection. SPLEEN: Normal. ADRENALS/URINARY TRACT: The adrenal glands are normal. No hydronephrosis, nephroureterolithiasis or solid renal mass. The urinary bladder is normal for degree of distention STOMACH/BOWEL: No hiatal hernia. There is a diverticulum of the third segment of the duodenum. No small bowel dilatation or inflammation. No focal colonic abnormality. Normal appendix. VASCULAR/LYMPHATIC: There is calcific atherosclerosis of the abdominal aorta. No lymphadenopathy. REPRODUCTIVE: Multiple calcified uterine fibroids. MUSCULOSKELETAL. Multilevel degenerative disc disease and facet arthrosis. No bony spinal canal stenosis. OTHER: None. IMPRESSION: 1. Distended gallbladder with cholelithiasis and mild intrahepatic biliary dilatation. If there is concern for acute cholecystitis, consider right upper quadrant ultrasound. 2. Fibroid uterus. Aortic atherosclerosis (ICD10-I70.0). Electronically Signed   By: KCletus GashD.  On: 06/30/2020 03:21   CT L-SPINE NO CHARGE  Result Date: 06/30/2020 CLINICAL DATA:  Right flank and back pain after fall EXAM: CT LUMBAR SPINE WITHOUT CONTRAST TECHNIQUE: Multidetector CT imaging of the lumbar spine was performed without intravenous contrast administration. Multiplanar CT image reconstructions were also generated. COMPARISON:   None. FINDINGS: Segmentation: 5 lumbar type vertebrae. Alignment: Normal. Vertebrae: No acute fracture or focal pathologic process. Paraspinal and other soft tissues: Calcific aortic atherosclerosis. Uterine fibroids. Disc levels: There is mild spinal canal stenosis at L4-5. There is severe bilateral L5-S1 neural foraminal stenosis. IMPRESSION: 1. No acute fracture or static subluxation of the lumbar spine. 2. Severe bilateral L5-S1 neural foraminal stenosis. Aortic Atherosclerosis (ICD10-I70.0). Electronically Signed   By: Ulyses Jarred M.D.   On: 06/30/2020 02:57       Assessment & Plan:    Active Problems:   Acute cholecystitis   1. Acute cholecystitis 1. With nausea, vomiting, abdominal pain, diarrhea, leukocytosis, CT abdomen evidence of possible cholecystitis 2. General surgery request right upper quadrant ultrasound in a.m. and " meds" per ED provider report 3. Start Zosyn 4. Pain scale 5. Admit to MedSurg 2. Hypertension 1. Blood pressure is well controlled in the ED 2. Continue Norvasc and Diovan 3. Hyperlipidemia 1. Continue statin 4. Diabetes mellitus type 2 1. Patient takes 8 units of insulin every 12 hours 2. Here we will give 10 units of insulin daily sliding scale coverage 3. Patient currently n.p.o.   DVT Prophylaxis-   Heparin- SCDs   AM Labs Ordered, also please review Full Orders  Family Communication: Admission, patients condition and plan of care including tests being ordered have been discussed with the patient and daughter who indicate understanding and agree with the plan and Code Status.  Code Status: Full Admission status: Inpatient :The appropriate admission status for this patient is INPATIENT. Inpatient status is judged to be reasonable and necessary in order to provide the required intensity of service to ensure the patient's safety. The patient's presenting symptoms, physical exam findings, and initial radiographic and laboratory data in the context  of their chronic comorbidities is felt to place them at high risk for further clinical deterioration. Furthermore, it is not anticipated that the patient will be medically stable for discharge from the hospital within 2 midnights of admission. The following factors support the admission status of inpatient.     The patient's presenting symptoms include n/v The worrisome physical exam findings include RUQ tenderness. The initial radiographic and laboratory data are worrisome because of cholecystitis on CT. The chronic co-morbidities include DMII, HTN, HLD.       * I certify that at the point of admission it is my clinical judgment that the patient will require inpatient hospital care spanning beyond 2 midnights from the point of admission due to high intensity of service, high risk for further deterioration and high frequency of surveillance required.*  Time spent in minutes : [place]

## 2020-06-30 NOTE — Progress Notes (Addendum)
Care started prior to midnight in the emergency room and patient was admitted early this morning after midnight by Dr. Carlynn Purl and I am in current agreement with her assessment and plan.  Additional changes to the plan of care been made accordingly.  The patient is an 80 year old obese African-American female with a past medical history significant for but not limited to CVA, history of hypertension, diabetes mellitus type 2, CKD stage IIIa, as well as other comorbidities who presented with a chief complaint of nausea vomiting.  She reports that her symptoms started last weekend and been admitted and unpredictable.  She feels that they are not related to mealtime and she reports that she has had her esophagus stretched in the past and she thought she was having symptoms from that.  She reported that she had some moisture and she thought maybe she had pulled poisoning and yesterday her symptoms came on with more intensity and have severe right upper quadrant pain that was sharp in nature and also described as a pressure.  She had nausea and vomiting in the.  As undigested food and also had diarrhea without blood.  She last reported her normal meal was dinner and that her appetite has been intact.  In the ED she came in and she had an elevated leukocytosis of 11.2 and chemistry panel unremarkable.  CT scan abdomen pelvis did show distended gallbladder with cholelithiasis and mild intrahepatic biliary dilatation and possibly acute cholecystitis.  Right upper quadrant ultrasound was recommended and ordered.  She did also have a CT of the L-spine which showed no acute fracture or static subluxation of the lumbar spine but did show severe bilateral L5-S1 neuroforaminal stenosis.  He is currently being admitted and treated for the following but not limited to  Suspected Acute Cholecystitis -She presented with nausea, vomiting and abdominal pain as well as diarrhea and had a leukocytosis -CT of the abdomen  pelvis done and showed possible cholecystitis given that she had a distal gallbladder with cholelithiasis and mild intrahepatic biliary dilatation -Right upper quadrant ultrasound was ordered and done and showed "Cholelithiasis and sludge in gallbladder with gallbladder wall thickening and focal tenderness over the gallbladder. These are findings raising concern for acute cholecystitis. Increased liver echogenicity, a finding likely indicative of hepatic steatosis. No focal liver lesions evident. Note that sensitivity of ultrasound for detection of focal liver lesions is diminished in this circumstance." -SHe was started on IV Zosyn and will continue  -SARS-CoV-2 Testing pending  -WBC was 11.2 -Blood cultures x2 were not obtained prior to admission -U/A was done and showed a clear appearance with greater than 500 glucose, small hemoglobin, 20 ketones, negative leukocytes, negative nitrites and no bacteria seen -Continue normal saline at 75 MLS per hour and will discontinue her hydrochlorothiazide -continue with pain control and patient has oxycodone IR 5 mg p.o. every 4h as needed for moderate pain and IV morphine 2 mg every 2 as needed severe pain -Continue supportive care and also continue with antiemetics 4 mg p.o./IV every 6 as needed nausea -Appreciate surgery recommendations -Currently patient is n.p.o. except meds and sips  Hypertension -Continue with home amlodipine 5 mg p.o. twice daily, and irbesartan 150 mg p.o. daily -We will hold home hydrochlorothiazide given that she is getting IV fluid hydration -Continue to monitor blood pressures per protocol -Last blood pressure reading was 148/53  HLD -Continue Atorvastatin 20 mg p.o. daily  Diabetes Mellitus Type II -Continue with Levemir 10 units sq qHS and with  moderate NovoLog sliding scale insulin before meals and at bedtime  -Last HbA1c was 6.4 a few months ago -CBG's range from 272-278   CKD Stage 3a -Patient's BUN/Cr is stable  at 14/0.75 -Avoid nephrotoxic medications, contrast dyes, hypotension and renally dose medications -Continue to Monitor and Trend Renal Fxn -Repeat CMP in the AM   Hx of CVA -C/w Atorvastatin 20 mg po Daily  Obesity -Complicates overall prognosis and care -Estimated body mass index is 30.99 kg/m as calculated from the following:   Height as of this encounter: 5\' 6"  (1.676 m).   Weight as of this encounter: 87.1 kg. -Weight Loss and Dietary Counseling given   We will continue to monitor the patient's clinical response to intervention and repeat blood work in the a.m. and follow-up on surgery recommendations.

## 2020-06-30 NOTE — Consult Note (Signed)
[facility] Consult  Reason for Consult: Acute cholecystitis  Referring Physician:  Dr. Alfredia Ferguson  Chief Complaint    Abdominal Pain      HPI: Sandra Washington is a 80 y.o. female with CKD, DM, HTN who comes in with abdominal pain, nausea / vomiting and some diarrhea that started Saturday and got worse on Tuesday. She says that she had fallen and thought the pain was related to that but then she ate some oysters and that she has had issues since that time. She was even worried about food poisoning but things continued. CT was done that questioned cholecystitis as well as as [place] that questions some thickening consistent with cholecystitis and stones.   Past Medical History:  Diagnosis Date  . Chronic kidney disease    kidney stone  . Diabetes mellitus without complication ([place])   . Hypertension   . Stroke [facility])     Past Surgical History:  Procedure Laterality Date  . BREAST BIOPSY Left   . CATARACT EXTRACTION Bilateral 2018   Dr. Tommy Rainwater  . HAND SURGERY    . NO PAST SURGERIES      History reviewed. No pertinent family history.  Social History   Tobacco Use  . Smoking status: Former Research scientist (life sciences)  . Smokeless tobacco: Never Used  Substance Use Topics  . Alcohol use: No  . Drug use: No    Medications:  I have reviewed the patient's current medications. Prior to Admission: (Not in a hospital admission)  Scheduled: . amLODipine  5 mg Oral BID  . atorvastatin  20 mg Oral Daily  . heparin  5,000 Units Subcutaneous Q8H  . insulin aspart  0-15 Units Subcutaneous TID WC  . insulin aspart  0-5 Units Subcutaneous QHS  . insulin detemir  10 Units Subcutaneous QHS  . irbesartan  150 mg Oral Daily  . pantoprazole (PROTONIX) IV  40 mg Intravenous Q24H   Continuous: . sodium chloride 75 mL/hr at 06/30/20 0956  . piperacillin-tazobactam (ZOSYN)  IV 3.375 g (06/30/20 1412)   [id number] **OR** acetaminophen, morphine injection, ondansetron **OR**  ondansetron (ZOFRAN) IV, oxyCODONE  No Known Allergies   ROS:  A comprehensive review of systems was negative except for: Gastrointestinal: positive for abdominal pain, diarrhea, nausea and vomiting  Blood pressure (!) 138/48, pulse 92, temperature 98.2 F (36.8 C), temperature source Oral, resp. rate 16, height 5\' 6"  (1.676 m), weight 87.1 kg, SpO2 94 %. Physical Exam Vitals reviewed.  Constitutional:      Appearance: She is well-developed.  HENT:     Head: Normocephalic.  Cardiovascular:     Rate and Rhythm: Normal rate.  Pulmonary:     Effort: Pulmonary effort is normal.     Breath sounds: Normal breath sounds.  Abdominal:     General: There is no distension.     Palpations: Abdomen is soft.     Tenderness: There is abdominal tenderness.     Hernia: No hernia is present.  Musculoskeletal:     Comments: Moves all extremities   Skin:    General: Skin is warm.  Neurological:     General: No focal deficit present.     Mental Status: She is alert and oriented to person, place, and time.  Psychiatric:        Mood and Affect: Mood normal.        Behavior: Behavior normal.     Results: Results for orders placed or performed during the hospital encounter of 06/29/20 (from the past  48 hour(s))  CBG monitoring, ED     Status: Abnormal   Collection Time: 06/29/20 11:04 PM  Result Value Ref Range   Glucose-Capillary 278 (H) 70 - 99 mg/dL    Comment: Glucose reference range applies only to samples taken after fasting for at least 8 hours.  Lipase, blood     Status: None   Collection Time: 06/30/20 12:14 AM  Result Value Ref Range   Lipase 27 11 - 51 U/L    Comment: Performed at [facility], [address], [place], [place] [address]  Comprehensive metabolic panel     Status: Abnormal   Collection Time: 06/30/20 12:14 AM  Result Value Ref Range   Sodium 137 135 - 145 mmol/L   Potassium 3.6 3.5 - 5.1 mmol/L   Chloride 103 98 - 111 mmol/L   CO2 25 22 - 32 mmol/L   Glucose,  Bld 306 (H) 70 - 99 mg/dL    Comment: Glucose reference range applies only to samples taken after fasting for at least 8 hours.   BUN 14 8 - 23 mg/dL   Creatinine, Ser 0.75 0.44 - 1.00 mg/dL   Calcium 9.6 8.9 - 10.3 mg/dL   Total Protein 7.2 6.5 - 8.1 g/dL   Albumin 4.1 3.5 - 5.0 g/dL   AST 15 15 - 41 U/L   ALT 17 0 - 44 U/L   Alkaline Phosphatase 70 38 - 126 U/L   Total Bilirubin 1.0 0.3 - 1.2 mg/dL   GFR, Estimated >60 >60 mL/min    Comment: (NOTE) Calculated using the CKD-EPI Creatinine Equation (2021)    Anion gap 9 5 - 15    Comment: Performed at [facility], [address] [place], [place] [address]  CBC     Status: Abnormal   Collection Time: 06/30/20 12:14 AM  Result Value Ref Range   WBC 11.2 (H) 4.0 - 10.5 K/uL   RBC 4.60 3.87 - 5.11 MIL/uL   Hemoglobin 12.5 12.0 - 15.0 g/dL   HCT 40.4 36.0 - 46.0 %   MCV 87.8 80.0 - 100.0 fL   MCH 27.2 26.0 - 34.0 pg   MCHC 30.9 30.0 - 36.0 g/dL   RDW 13.6 11.5 - 15.5 %   Platelets 236 150 - 400 K/uL   nRBC 0.0 0.0 - 0.2 %    Comment: Performed at [facility], [address], [place], [place] [address]  Urinalysis, Routine w reflex microscopic     Status: Abnormal   Collection Time: 06/30/20  3:40 AM  Result Value Ref Range   Color, Urine COLORLESS (A) YELLOW   APPearance CLEAR CLEAR   Specific Gravity, Urine 1.023 1.005 - 1.030   pH 7.0 5.0 - 8.0   Glucose, UA >=500 (A) NEGATIVE mg/dL   Hgb urine dipstick SMALL (A) NEGATIVE   Bilirubin Urine NEGATIVE NEGATIVE   Ketones, ur 20 (A) NEGATIVE mg/dL   Protein, ur 30 (A) NEGATIVE mg/dL   Nitrite NEGATIVE NEGATIVE   Leukocytes,Ua NEGATIVE NEGATIVE   RBC / HPF 0-5 0 - 5 RBC/hpf   WBC, UA 0-5 0 - 5 WBC/hpf   Bacteria, UA NONE SEEN NONE SEEN    Comment: Performed at [facility], [address], [place], [place] [address]  CBG monitoring, ED     Status: Abnormal   Collection Time: 06/30/20 11:59 AM  Result Value Ref Range   Glucose-Capillary 272 (H) 70 - 99 mg/dL    Comment:  Glucose reference range applies only to  samples taken after fasting for at least 8 hours.   Personally reviewed- thickened gallbladder, distended gallbladder, stones   CT Abdomen Pelvis W Contrast  Result Date: 06/30/2020 CLINICAL DATA:  Acute abdominal pain EXAM: CT ABDOMEN AND PELVIS WITH CONTRAST TECHNIQUE: Multidetector CT imaging of the abdomen and pelvis was performed using the standard protocol following bolus administration of intravenous contrast. CONTRAST:  120mL OMNIPAQUE IOHEXOL 300 MG/ML  SOLN COMPARISON:  04/09/2012 FINDINGS: LOWER CHEST: Normal. HEPATOBILIARY: Distended gallbladder with cholelithiasis. Mild intrahepatic biliary dilatation . no focal liver abnormality. Common bile duct is normal caliber. PANCREAS: Normal pancreas. No ductal dilatation or peripancreatic fluid collection. SPLEEN: Normal. ADRENALS/URINARY TRACT: The adrenal glands are normal. No hydronephrosis, nephroureterolithiasis or solid renal mass. The urinary bladder is normal for degree of distention STOMACH/BOWEL: No hiatal hernia. There is a diverticulum of the third segment of the duodenum. No small bowel dilatation or inflammation. No focal colonic abnormality. Normal appendix. VASCULAR/LYMPHATIC: There is calcific atherosclerosis of the abdominal aorta. No lymphadenopathy. REPRODUCTIVE: Multiple calcified uterine fibroids. MUSCULOSKELETAL. Multilevel degenerative disc disease and facet arthrosis. No bony spinal canal stenosis. OTHER: None. IMPRESSION: 1. Distended gallbladder with cholelithiasis and mild intrahepatic biliary dilatation. If there is concern for acute cholecystitis, consider right upper quadrant ultrasound. 2. Fibroid uterus. Aortic atherosclerosis (ICD10-I70.0). Electronically Signed   By: Ulyses Jarred M.D.   On: 06/30/2020 03:21   CT L-SPINE NO CHARGE  Result Date: 06/30/2020 CLINICAL DATA:  Right flank and back pain after fall EXAM: CT LUMBAR SPINE WITHOUT CONTRAST TECHNIQUE: Multidetector CT  imaging of the lumbar spine was performed without intravenous contrast administration. Multiplanar CT image reconstructions were also generated. COMPARISON:  None. FINDINGS: Segmentation: 5 lumbar type vertebrae. Alignment: Normal. Vertebrae: No acute fracture or focal pathologic process. Paraspinal and other soft tissues: Calcific aortic atherosclerosis. Uterine fibroids. Disc levels: There is mild spinal canal stenosis at L4-5. There is severe bilateral L5-S1 neural foraminal stenosis. IMPRESSION: 1. No acute fracture or static subluxation of the lumbar spine. 2. Severe bilateral L5-S1 neural foraminal stenosis. Aortic Atherosclerosis (ICD10-I70.0). Electronically Signed   By: Ulyses Jarred M.D.   On: 06/30/2020 02:57   US Abdomen Limited RUQ (LIVER/GB)  Result Date: 06/30/2020 CLINICAL DATA:  Upper abdominal pain EXAM: ULTRASOUND ABDOMEN LIMITED RIGHT UPPER QUADRANT COMPARISON:  CT abdomen and pelvis Jun 30, 2020 FINDINGS: Gallbladder: Within the gallbladder, there are echogenic foci which move and shadow consistent with cholelithiasis. Largest gallstone measures 9 mm in length. There is gallbladder wall thickening. There is also sludge in the gallbladder. No pericholecystic fluid. Patient is focally tender over the gallbladder. Common bile duct: Diameter: 2 mm. No evident intrahepatic or extrahepatic biliary duct dilatation. Liver: No focal lesion identified. Liver echogenicity overall increased. Portal vein is patent on color Doppler imaging with normal direction of blood flow towards the liver. Other: None. IMPRESSION: 1. Cholelithiasis and sludge in gallbladder with gallbladder wall thickening and focal tenderness over the gallbladder. These are findings raising concern for acute cholecystitis. 2. Increased liver echogenicity, a finding likely indicative of hepatic steatosis. No focal liver lesions evident. Note that sensitivity of ultrasound for detection of focal liver lesions is diminished in this  circumstance. Electronically Signed   By: Lowella Grip III M.D.   On: 06/30/2020 08:17     Assessment & Plan:  Sandra Washington is a 80 y.o. female with what looks like cholecystitis on imaging with RUQ pain.  -PLAN: I counseled the patient about the indication, risks and benefits of laparoscopic cholecystectomy.  She understands there is a very small chance for bleeding, infection, injury to normal structures (including common bile duct), conversion to open surgery, persistent symptoms, evolution of postcholecystectomy diarrhea, need for secondary interventions, anesthesia reaction, cardiopulmonary issues and other risks not specifically detailed here. I described the expected recovery, the plan for follow-up and the restrictions during the recovery phase.  All questions were answered.  -NPO at midnight, ok to have clears nwo -IV antibiotics for now  -COVID pending   All questions were answered to the satisfaction of the patient.    Virl Cagey 06/30/2020, 4:32 PM

## 2020-06-30 NOTE — ED Provider Notes (Signed)
[facility] EMERGENCY DEPARTMENT Provider Note   CSN: [id number] Arrival date & time: 06/29/20  2249     History Chief Complaint  Patient presents with  . Abdominal Pain    Sandra Washington is a 80 y.o. female.  HPI   80 year old female with past medical history of DM, HTN, CKD presents the emergency department with concern for abdominal pain, nausea/vomiting/diarrhea as well as back pain.  Patient states about 5 days ago she had a mechanical fall down onto her right hip.  Since then she has been having mid/right lower back pain.  She has been ambulatory, did not initially get evaluated for this fall.  Denies any head injury/loss of consciousness, hip pain.  About 2 days ago patient developed nausea and over the last day has been experiencing vomiting, diarrhea both of which are nonbloody.  She is now having right flank and diffuse abdominal pain.  Denies any fever/chills.  Past Medical History:  Diagnosis Date  . Chronic kidney disease    kidney stone  . Diabetes mellitus without complication ([place])   . Hypertension   . Stroke [facility])     Patient Active Problem List   Diagnosis Date Noted  . Background diabetic retinopathy ([place]) 01/15/2020  . Hypertension, essential 10/30/2014  . Hypercholesterolemia 11/06/2012  . Unspecified essential hypertension 11/06/2012  . Type II or unspecified type diabetes mellitus without mention of complication, uncontrolled 10/23/2012    Past Surgical History:  Procedure Laterality Date  . BREAST BIOPSY Left   . CATARACT EXTRACTION Bilateral 2018   Dr. Tommy Rainwater  . HAND SURGERY    . NO PAST SURGERIES       OB History   No obstetric history on file.     No family history on file.  Social History   Tobacco Use  . Smoking status: Former Research scientist (life sciences)  . Smokeless tobacco: Never Used  Substance Use Topics  . Alcohol use: No  . Drug use: No    Home Medications Prior to Admission medications   Medication Sig Start Date End Date Taking?  Authorizing Provider  ACCU-CHEK SOFTCLIX LANCETS lancets Use bid 05/01/17   Elayne Snare, MD  amLODipine (NORVASC) 5 MG tablet Take 5 mg by mouth 2 (two) times daily. Take 1 tablets by mouth twice daily. 10/19/14   [provider]  atorvastatin (LIPITOR) 20 MG tablet Take 1 tablet (20 mg total) by mouth daily. 12/17/18   Elayne Snare, MD  bisacodyl (DULCOLAX) 5 MG EC tablet Take 5 mg by mouth daily.    [provider]  Blood Glucose Monitoring Suppl (ACCU-CHEK GUIDE ME) w/Device KIT 1 each by Does not apply route 2 (two) times daily. Use accu chek guide me device to check blood sugar twice daily.DX:E11.65 12/23/18   Elayne Snare, MD  Cholecalciferol (VITAMIN D3) 5000 UNITS CAPS Take 5,000 Units by mouth.    [provider]  clotrimazole-betamethasone (LOTRISONE) cream Apply 1 application topically 2 (two) times daily.    [provider]  glucose blood (ACCU-CHEK GUIDE) test strip 1 each by Other route 2 (two) times daily. Use as instructed 12/17/18   Elayne Snare, MD  insulin NPH Human (NOVOLIN N RELION) 100 UNIT/ML injection 8 U in am and 12 U hs 11/06/19   Elayne Snare, MD  insulin regular (NOVOLIN R RELION) 100 units/mL injection INJECT 8 UNITS SUBCUTANEOUSLY WITH BREAKFAST AND LUNCH, AND 16 UNITS WITH DINNER 11/06/19   Elayne Snare, MD  niacin (SLO-NIACIN) 500 MG tablet Take 500 mg  by mouth at bedtime.    [provider]  omeprazole (PRILOSEC) 40 MG capsule Take 40 mg by mouth daily.    [provider]  valsartan-hydrochlorothiazide (DIOVAN-HCT) 160-12.5 MG tablet Take 1 tablet by mouth daily. Take 1 tablet by mouth once daily.    [provider]    Allergies    Patient has no known allergies.  Review of Systems   Review of Systems  Constitutional: Positive for fatigue. Negative for chills and fever.  HENT: Negative for congestion.   Eyes: Negative for visual disturbance.  Respiratory: Negative for shortness of breath.   Cardiovascular:  Negative for chest pain.  Gastrointestinal: Positive for abdominal pain, diarrhea, nausea and vomiting.  Genitourinary: Positive for flank pain. Negative for dysuria and hematuria.  Musculoskeletal: Positive for back pain.  Skin: Negative for rash.  Neurological: Negative for headaches.    Physical Exam Updated Vital Signs BP 139/68 (BP Location: Left Arm)   Pulse 100   Temp 98.5 F (36.9 C) (Oral)   Resp 20   Ht _0  (1.676 m)   Wt 87.1 kg   SpO2 98%   BMI 30.99 kg/m   Physical Exam Vitals and nursing note reviewed.  Constitutional:      Appearance: Normal appearance.  HENT:     Head: Normocephalic.     Mouth/Throat:     Mouth: Mucous membranes are moist.  Cardiovascular:     Rate and Rhythm: Normal rate.  Pulmonary:     Effort: Pulmonary effort is normal. No respiratory distress.  Abdominal:     Palpations: Abdomen is soft. There is no mass.     Tenderness: There is generalized abdominal tenderness. There is guarding. There is no rebound.     Hernia: No hernia is present.  Musculoskeletal:     Comments: Tenderness to palpation of the right lower lumbar area into the SI region with some flank pain  Skin:    General: Skin is warm.  Neurological:     Mental Status: She is alert and oriented to person, place, and time. Mental status is at baseline.  Psychiatric:        Mood and Affect: Mood normal.     ED Results / Procedures / Treatments   Labs (all labs ordered are listed, but only abnormal results are displayed) Labs Reviewed  COMPREHENSIVE METABOLIC PANEL - Abnormal; Notable for the following components:      Result Value   Glucose, Bld 306 (*)    All other components within normal limits  CBC - Abnormal; Notable for the following components:   WBC 11.2 (*)    All other components within normal limits  CBG MONITORING, ED - Abnormal; Notable for the following components:   Glucose-Capillary 278 (*)    All other components within normal limits  LIPASE,  BLOOD  URINALYSIS, ROUTINE W REFLEX MICROSCOPIC    EKG None  Radiology No results found.  Procedures Procedures   Medications Ordered in ED Medications  sodium chloride 0.9 % bolus 500 mL (has no administration in time range)  morphine 4 MG/ML injection 4 mg (has no administration in time range)  metoCLOPramide (REGLAN) injection 5 mg (has no administration in time range)  ondansetron (ZOFRAN) injection 4 mg (4 mg Intravenous Given 06/30/20 0021)    ED Course  I have reviewed the triage vital signs and the nursing notes.  Pertinent labs & imaging results that were available during my care of the patient were reviewed by me and  considered in my medical decision making (see chart for details).    MDM Rules/Calculators/A&P                          80 year old female presents the emergency department with lower back pain stemming from a mechanical fall couple days ago along with 2 days of diffuse abdominal pain, nausea/vomiting/diarrhea.  Blood work shows a mild leukocytosis, CAT scan shows a distended gallbladder with cholelithiasis and intrahepatic duct dilation, suspicious for cholecystitis.  Ultrasound recommended.  General surgery contacted.  Patient is requiring further pain medicine and nausea medicine.  They recommend medical admit and ultrasound in the morning for further evaluation.  Patients evaluation and results requires admission for further treatment and care. Patient agrees with admission plan, offers no new complaints and is stable/unchanged at time of admit.  Final Clinical Impression(s) / ED Diagnoses Final diagnoses:  Pain    Rx / DC Orders ED Discharge Orders    None       Lorelle Gibbs, DO 06/30/20 0522

## 2020-06-30 NOTE — Progress Notes (Signed)
Pt arrived to room 315 via [facility] from ED. Pt ambulatory from [facility] to bed without difficulty. Call bell in reach, safety procedures explained to pt with stated understanding. IV pump beeping, IV site in right AC assessed, flushes easily with good blood return. IV pump restarted.

## 2020-07-01 ENCOUNTER — Observation Stay (HOSPITAL_COMMUNITY): Payer: Medicare HMO | Admitting: Anesthesiology

## 2020-07-01 ENCOUNTER — Encounter (HOSPITAL_COMMUNITY): Payer: Self-pay | Admitting: Internal Medicine

## 2020-07-01 ENCOUNTER — Encounter (HOSPITAL_COMMUNITY)
Admission: EM | Disposition: A | Discharge status: Home / Self Care | Payer: Self-pay | Source: Home / Self Care | Attending: Internal Medicine

## 2020-07-01 DIAGNOSIS — Z794 Long term (current) use of insulin: Secondary | ICD-10-CM | POA: Diagnosis not present

## 2020-07-01 DIAGNOSIS — Z8673 Personal history of transient ischemic attack (TIA), and cerebral infarction without residual deficits: Secondary | ICD-10-CM | POA: Diagnosis not present

## 2020-07-01 DIAGNOSIS — Z20822 Contact with and (suspected) exposure to covid-19: Secondary | ICD-10-CM | POA: Diagnosis not present

## 2020-07-01 DIAGNOSIS — E876 Hypokalemia: Secondary | ICD-10-CM | POA: Diagnosis not present

## 2020-07-01 DIAGNOSIS — I129 Hypertensive chronic kidney disease with stage 1 through stage 4 chronic kidney disease, or unspecified chronic kidney disease: Secondary | ICD-10-CM | POA: Diagnosis not present

## 2020-07-01 DIAGNOSIS — N1831 Chronic kidney disease, stage 3a: Secondary | ICD-10-CM | POA: Diagnosis present

## 2020-07-01 DIAGNOSIS — E871 Hypo-osmolality and hyponatremia: Secondary | ICD-10-CM | POA: Diagnosis not present

## 2020-07-01 DIAGNOSIS — D72829 Elevated white blood cell count, unspecified: Secondary | ICD-10-CM | POA: Diagnosis not present

## 2020-07-01 DIAGNOSIS — D649 Anemia, unspecified: Secondary | ICD-10-CM | POA: Diagnosis not present

## 2020-07-01 DIAGNOSIS — E11649 Type 2 diabetes mellitus with hypoglycemia without coma: Secondary | ICD-10-CM | POA: Diagnosis not present

## 2020-07-01 DIAGNOSIS — Z79899 Other long term (current) drug therapy: Secondary | ICD-10-CM | POA: Diagnosis not present

## 2020-07-01 DIAGNOSIS — I10 Essential (primary) hypertension: Secondary | ICD-10-CM | POA: Diagnosis not present

## 2020-07-01 DIAGNOSIS — E785 Hyperlipidemia, unspecified: Secondary | ICD-10-CM | POA: Diagnosis not present

## 2020-07-01 DIAGNOSIS — K82A1 Gangrene of gallbladder in cholecystitis: Secondary | ICD-10-CM | POA: Diagnosis not present

## 2020-07-01 DIAGNOSIS — D6489 Other specified anemias: Secondary | ICD-10-CM | POA: Diagnosis present

## 2020-07-01 DIAGNOSIS — Z87891 Personal history of nicotine dependence: Secondary | ICD-10-CM | POA: Diagnosis not present

## 2020-07-01 DIAGNOSIS — K810 Acute cholecystitis: Secondary | ICD-10-CM | POA: Diagnosis present

## 2020-07-01 DIAGNOSIS — K8000 Calculus of gallbladder with acute cholecystitis without obstruction: Secondary | ICD-10-CM | POA: Diagnosis not present

## 2020-07-01 DIAGNOSIS — E1122 Type 2 diabetes mellitus with diabetic chronic kidney disease: Secondary | ICD-10-CM | POA: Diagnosis present

## 2020-07-01 DIAGNOSIS — E1165 Type 2 diabetes mellitus with hyperglycemia: Secondary | ICD-10-CM | POA: Diagnosis not present

## 2020-07-01 DIAGNOSIS — Z6830 Body mass index (BMI) 30.0-30.9, adult: Secondary | ICD-10-CM | POA: Diagnosis not present

## 2020-07-01 DIAGNOSIS — K219 Gastro-esophageal reflux disease without esophagitis: Secondary | ICD-10-CM | POA: Diagnosis present

## 2020-07-01 DIAGNOSIS — E669 Obesity, unspecified: Secondary | ICD-10-CM | POA: Diagnosis present

## 2020-07-01 HISTORY — PX: CHOLECYSTECTOMY: SHX55

## 2020-07-01 LAB — CBC WITH DIFFERENTIAL/PLATELET
Abs Immature Granulocytes: 0.15 10*3/uL — ABNORMAL HIGH (ref 0.00–0.07)
Basophils Absolute: 0.1 10*3/uL (ref 0.0–0.1)
Basophils Relative: 0 %
Eosinophils Absolute: 0.1 10*3/uL (ref 0.0–0.5)
Eosinophils Relative: 0 %
HCT: 38.5 % (ref 36.0–46.0)
Hemoglobin: 12 g/dL (ref 12.0–15.0)
Immature Granulocytes: 1 %
Lymphocytes Relative: 6 %
Lymphs Abs: 1.2 10*3/uL (ref 0.7–4.0)
MCH: 27.4 pg (ref 26.0–34.0)
MCHC: 31.2 g/dL (ref 30.0–36.0)
MCV: 87.9 fL (ref 80.0–100.0)
Monocytes Absolute: 1.4 10*3/uL — ABNORMAL HIGH (ref 0.1–1.0)
Monocytes Relative: 7 %
Neutro Abs: 16.7 10*3/uL — ABNORMAL HIGH (ref 1.7–7.7)
Neutrophils Relative %: 86 %
Platelets: 220 10*3/uL (ref 150–400)
RBC: 4.38 MIL/uL (ref 3.87–5.11)
RDW: 14.1 % (ref 11.5–15.5)
WBC: 19.6 10*3/uL — ABNORMAL HIGH (ref 4.0–10.5)
nRBC: 0 % (ref 0.0–0.2)

## 2020-07-01 LAB — GLUCOSE, CAPILLARY
Glucose-Capillary: 215 mg/dL — ABNORMAL HIGH (ref 70–99)
Glucose-Capillary: 217 mg/dL — ABNORMAL HIGH (ref 70–99)
Glucose-Capillary: 245 mg/dL — ABNORMAL HIGH (ref 70–99)
Glucose-Capillary: 233 mg/dL — ABNORMAL HIGH (ref 70–99)
Glucose-Capillary: 172 mg/dL — ABNORMAL HIGH (ref 70–99)

## 2020-07-01 LAB — COMPREHENSIVE METABOLIC PANEL
ALT: 13 U/L (ref 0–44)
AST: 13 U/L — ABNORMAL LOW (ref 15–41)
Albumin: 3.2 g/dL — ABNORMAL LOW (ref 3.5–5.0)
Alkaline Phosphatase: 56 U/L (ref 38–126)
Anion gap: 8 (ref 5–15)
BUN: 19 mg/dL (ref 8–23)
CO2: 26 mmol/L (ref 22–32)
Calcium: 8.9 mg/dL (ref 8.9–10.3)
Chloride: 101 mmol/L (ref 98–111)
Creatinine, Ser: 0.84 mg/dL (ref 0.44–1.00)
GFR, Estimated: >60 mL/min (ref >60)
Glucose, Bld: 226 mg/dL — ABNORMAL HIGH (ref 70–99)
Potassium: 3.4 mmol/L — ABNORMAL LOW (ref 3.5–5.1)
Sodium: 135 mmol/L (ref 135–145)
Total Bilirubin: 1.2 mg/dL (ref 0.3–1.2)
Total Protein: 6.3 g/dL — ABNORMAL LOW (ref 6.5–8.1)

## 2020-07-01 LAB — MAGNESIUM: Magnesium: 1.7 mg/dL (ref 1.7–2.4)

## 2020-07-01 LAB — PHOSPHORUS: Phosphorus: 2.2 mg/dL — ABNORMAL LOW (ref 2.5–4.6)

## 2020-07-01 LAB — MRSA PCR SCREENING: MRSA by PCR: NEGATIVE

## 2020-07-01 SURGERY — LAPAROSCOPIC CHOLECYSTECTOMY
Anesthesia: General | Site: Abdomen

## 2020-07-01 MED ORDER — FENTANYL CITRATE (PF) 100 MCG/2ML IJ SOLN
25.0000 ug | INTRAMUSCULAR | Status: DC | PRN
  Administered 2020-07-01 (×2): 50 ug via INTRAVENOUS
  Filled 2020-07-01: qty 2

## 2020-07-01 MED ORDER — OXYCODONE HCL 5 MG PO TABS
5.0000 mg | ORAL_TABLET | ORAL | Status: DC | PRN
  Administered 2020-07-01 (×3): 10 mg via ORAL
  Administered 2020-07-02: 5 mg via ORAL
  Filled 2020-07-01 (×3): qty 2
  Filled 2020-07-01: qty 1

## 2020-07-01 MED ORDER — CHLORHEXIDINE GLUCONATE 0.12 % MT SOLN
15.0000 mL | Freq: Once | OROMUCOSAL | Status: AC
  Administered 2020-07-01: 15 mL via OROMUCOSAL

## 2020-07-01 MED ORDER — PROPOFOL 10 MG/ML IV BOLUS
INTRAVENOUS | Status: DC | PRN
  Administered 2020-07-01: 80 mg via INTRAVENOUS

## 2020-07-01 MED ORDER — FENTANYL CITRATE (PF) 100 MCG/2ML IJ SOLN
INTRAMUSCULAR | Status: DC | PRN
  Administered 2020-07-01 (×4): 50 ug via INTRAVENOUS

## 2020-07-01 MED ORDER — POTASSIUM PHOSPHATES 15 MMOLE/5ML IV SOLN
20.0000 mmol | Freq: Once | INTRAVENOUS | Status: DC
  Filled 2020-07-01: qty 6.67

## 2020-07-01 MED ORDER — LIDOCAINE HCL (PF) 2 % IJ SOLN
INTRAMUSCULAR | Status: AC
  Filled 2020-07-01: qty 5

## 2020-07-01 MED ORDER — HEPARIN SODIUM (PORCINE) 5000 UNIT/ML IJ SOLN
5000.0000 [IU] | Freq: Three times a day (TID) | INTRAMUSCULAR | Status: DC
  Administered 2020-07-02 – 2020-07-03 (×4): 5000 [IU] via SUBCUTANEOUS
  Filled 2020-07-01 (×4): qty 1

## 2020-07-01 MED ORDER — INSULIN DETEMIR 100 UNIT/ML ~~LOC~~ SOLN
8.0000 [IU] | Freq: Two times a day (BID) | SUBCUTANEOUS | Status: DC
  Administered 2020-07-01 – 2020-07-02 (×3): 8 [IU] via SUBCUTANEOUS
  Filled 2020-07-01 (×8): qty 0.08

## 2020-07-01 MED ORDER — POTASSIUM PHOSPHATES 15 MMOLE/5ML IV SOLN
20.0000 mmol | Freq: Once | INTRAVENOUS | Status: AC
  Administered 2020-07-01: 20 mmol via INTRAVENOUS
  Filled 2020-07-01: qty 6.67

## 2020-07-01 MED ORDER — LACTATED RINGERS IV SOLN: INTRAVENOUS | Status: DC

## 2020-07-01 MED ORDER — SODIUM CHLORIDE 0.9 % IR SOLN
Status: DC | PRN
  Administered 2020-07-01: 3000 mL

## 2020-07-01 MED ORDER — ROCURONIUM BROMIDE 10 MG/ML (PF) SYRINGE
PREFILLED_SYRINGE | INTRAVENOUS | Status: AC
  Filled 2020-07-01: qty 10

## 2020-07-01 MED ORDER — BUPIVACAINE HCL (PF) 0.5 % IJ SOLN
INTRAMUSCULAR | Status: AC
  Filled 2020-07-01: qty 30

## 2020-07-01 MED ORDER — ORAL CARE MOUTH RINSE: 15.0000 mL | Freq: Once | OROMUCOSAL | Status: AC

## 2020-07-01 MED ORDER — ONDANSETRON HCL 4 MG/2ML IJ SOLN
INTRAMUSCULAR | Status: AC
  Filled 2020-07-01: qty 2

## 2020-07-01 MED ORDER — LIDOCAINE HCL (CARDIAC) PF 50 MG/5ML IV SOSY
PREFILLED_SYRINGE | INTRAVENOUS | Status: DC | PRN
  Administered 2020-07-01: 50 mg via INTRAVENOUS

## 2020-07-01 MED ORDER — PROPOFOL 10 MG/ML IV BOLUS
INTRAVENOUS | Status: AC
  Filled 2020-07-01: qty 20

## 2020-07-01 MED ORDER — HEMOSTATIC AGENTS (NO CHARGE) OPTIME
TOPICAL | Status: DC | PRN
  Administered 2020-07-01 (×2): 1 via TOPICAL

## 2020-07-01 MED ORDER — MAGNESIUM SULFATE 2 GM/50ML IV SOLN
2.0000 g | Freq: Once | INTRAVENOUS | Status: AC
  Administered 2020-07-01: 2 g via INTRAVENOUS
  Filled 2020-07-01 (×2): qty 50

## 2020-07-01 MED ORDER — ROCURONIUM 10MG/ML (10ML) SYRINGE FOR MEDFUSION PUMP - OPTIME
INTRAVENOUS | Status: DC | PRN
  Administered 2020-07-01: 40 mg via INTRAVENOUS

## 2020-07-01 MED ORDER — ACETAMINOPHEN 500 MG PO TABS
1000.0000 mg | ORAL_TABLET | Freq: Four times a day (QID) | ORAL | Status: DC
  Administered 2020-07-01 – 2020-07-02 (×7): 1000 mg via ORAL
  Filled 2020-07-01 (×9): qty 2

## 2020-07-01 MED ORDER — EPHEDRINE SULFATE 50 MG/ML IJ SOLN
INTRAMUSCULAR | Status: DC | PRN
  Administered 2020-07-01 (×2): 10 mg via INTRAVENOUS

## 2020-07-01 MED ORDER — ONDANSETRON HCL 4 MG/2ML IJ SOLN
INTRAMUSCULAR | Status: DC | PRN
  Administered 2020-07-01: 4 mg via INTRAVENOUS

## 2020-07-01 MED ORDER — POTASSIUM CHLORIDE 10 MEQ/100ML IV SOLN
10.0000 meq | INTRAVENOUS | Status: AC
  Administered 2020-07-01 (×3): 10 meq via INTRAVENOUS
  Filled 2020-07-01 (×6): qty 100

## 2020-07-01 MED ORDER — SODIUM CHLORIDE 0.9 % IR SOLN
Status: DC | PRN
  Administered 2020-07-01: 1000 mL

## 2020-07-01 MED ORDER — CHLORHEXIDINE GLUCONATE 0.12 % MT SOLN
OROMUCOSAL | Status: AC
  Filled 2020-07-01: qty 15

## 2020-07-01 MED ORDER — BUPIVACAINE HCL (PF) 0.5 % IJ SOLN
INTRAMUSCULAR | Status: DC | PRN
  Administered 2020-07-01: 10 mL

## 2020-07-01 MED ORDER — FENTANYL CITRATE (PF) 250 MCG/5ML IJ SOLN
INTRAMUSCULAR | Status: AC
  Filled 2020-07-01: qty 5

## 2020-07-01 MED ORDER — SUGAMMADEX SODIUM 200 MG/2ML IV SOLN
INTRAVENOUS | Status: DC | PRN
  Administered 2020-07-01: 200 mg via INTRAVENOUS

## 2020-07-01 SURGICAL SUPPLY — 47 items
ADH SKN CLS APL DERMABOND .7 (GAUZE/BANDAGES/DRESSINGS) ×1
APL PRP STRL LF DISP 70% ISPRP (MISCELLANEOUS) ×1
APL SRG 38 LTWT LNG FL B (MISCELLANEOUS) ×1
APPLICATOR ARISTA FLEXITIP XL (MISCELLANEOUS) ×1 IMPLANT
APPLIER CLIP ROT 10 11.4 M/L (STAPLE) ×2
APR CLP MED LRG 11.4X10 (STAPLE) ×1
BAG RETRIEVAL 10 (BASKET) ×1
BLADE SURG 15 STRL LF DISP TIS (BLADE) ×1 IMPLANT
BLADE SURG 15 STRL SS (BLADE) ×2
CHLORAPREP W/TINT 26 (MISCELLANEOUS) ×2 IMPLANT
CLIP APPLIE ROT 10 11.4 M/L (STAPLE) ×1 IMPLANT
CLOTH BEACON ORANGE TIMEOUT ST (SAFETY) ×2 IMPLANT
COVER LIGHT HANDLE STERIS (MISCELLANEOUS) ×4 IMPLANT
COVER WAND RF STERILE (DRAPES) ×2 IMPLANT
DECANTER SPIKE VIAL GLASS SM (MISCELLANEOUS) ×2 IMPLANT
DERMABOND ADVANCED (GAUZE/BANDAGES/DRESSINGS) ×1
DERMABOND ADVANCED .7 DNX12 (GAUZE/BANDAGES/DRESSINGS) ×1 IMPLANT
ELECT REM PT RETURN 9FT ADLT (ELECTROSURGICAL) ×2
ELECTRODE REM PT RTRN 9FT ADLT (ELECTROSURGICAL) ×1 IMPLANT
GLOVE SURG ENC MOIS LTX SZ6.5 (GLOVE) ×2 IMPLANT
GLOVE SURG UNDER POLY LF SZ6.5 (GLOVE) ×2 IMPLANT
GLOVE SURG UNDER POLY LF SZ7 (GLOVE) ×7 IMPLANT
GOWN STRL REUS W/TWL LRG LVL3 (GOWN DISPOSABLE) ×6 IMPLANT
HEMOSTAT ARISTA ABSORB 3G PWDR (HEMOSTASIS) ×1 IMPLANT
HEMOSTAT SNOW SURGICEL 2X4 (HEMOSTASIS) ×2 IMPLANT
INST SET LAPROSCOPIC AP (KITS) ×2 IMPLANT
IV NS IRRIG 3000ML ARTHROMATIC (IV SOLUTION) ×1 IMPLANT
KIT TURNOVER KIT A (KITS) ×2 IMPLANT
MANIFOLD NEPTUNE II (INSTRUMENTS) ×2 IMPLANT
NDL INSUFFLATION 14GA 120MM (NEEDLE) ×1 IMPLANT
NEEDLE INSUFFLATION 14GA 120MM (NEEDLE) ×2 IMPLANT
NS IRRIG 1000ML POUR BTL (IV SOLUTION) ×2 IMPLANT
PACK LAP CHOLE LZT030E (CUSTOM PROCEDURE TRAY) ×2 IMPLANT
PAD ARMBOARD 7.5X6 YLW CONV (MISCELLANEOUS) ×3 IMPLANT
SET BASIN LINEN APH (SET/KITS/TRAYS/PACK) ×2 IMPLANT
SET TUBE IRRIG SUCTION NO TIP (IRRIGATION / IRRIGATOR) ×1 IMPLANT
SET TUBE SMOKE EVAC HIGH FLOW (TUBING) ×2 IMPLANT
SLEEVE ENDOPATH XCEL 5M (ENDOMECHANICALS) ×2 IMPLANT
SUT MNCRL AB 4-0 PS2 18 (SUTURE) ×4 IMPLANT
SUT VICRYL 0 UR6 27IN ABS (SUTURE) ×2 IMPLANT
SYS BAG RETRIEVAL 10MM (BASKET) ×1
SYSTEM BAG RETRIEVAL 10MM (BASKET) ×1 IMPLANT
TROCAR ENDO BLADELESS 11MM (ENDOMECHANICALS) ×2 IMPLANT
TROCAR XCEL NON-BLD 5MMX100MML (ENDOMECHANICALS) ×2 IMPLANT
TROCAR XCEL UNIV SLVE 11M 100M (ENDOMECHANICALS) ×2 IMPLANT
TUBE CONNECTING 12X1/4 (SUCTIONS) ×2 IMPLANT
WARMER LAPAROSCOPE (MISCELLANEOUS) ×2 IMPLANT

## 2020-07-01 NOTE — Interval H&P Note (Signed)
History and Physical Interval Note:  07/01/2020 7:22 AM  [name]  has presented today for surgery, with the diagnosis of acute cholecystitis.  The various methods of treatment have been discussed with the patient and family. After consideration of risks, benefits and other options for treatment, the patient has consented to  Procedure(s): LAPAROSCOPIC CHOLECYSTECTOMY (N/A) as a surgical intervention.  The patient's history has been reviewed, patient examined, no change in status, stable for surgery.  I have reviewed the patient's chart and labs.  Questions were answered to the patient's satisfaction.     Sandra Washington

## 2020-07-01 NOTE — Progress Notes (Signed)
PROGRESS NOTE    Sandra Washington  GHW:[id number] DOB: [birth year] DOA: 06/29/2020 PCP: Lucianne Lei, MD  Brief Narrative:  The patient is an 80 year old obese African-American female with a past medical history significant for but not limited to CVA, history of hypertension, diabetes mellitus type 2, CKD stage IIIa, as well as other comorbidities who presented with a chief complaint of nausea vomiting.  She reports that her symptoms started last weekend and been admitted and unpredictable.  She feels that they are not related to mealtime and she reports that she has had her esophagus stretched in the past and she thought she was having symptoms from that.  She reported that she had some moisture and she thought maybe she had pulled poisoning and yesterday her symptoms came on with more intensity and have severe right upper quadrant pain that was sharp in nature and also described as a pressure.  She had nausea and vomiting in the.  As undigested food and also had diarrhea without blood.  She last reported her normal meal was dinner and that her appetite has been intact.  In the ED she came in and she had an elevated leukocytosis of 11.2 and chemistry panel unremarkable.  CT scan abdomen pelvis did show distended gallbladder with cholelithiasis and mild intrahepatic biliary dilatation and possibly acute cholecystitis.  Right upper quadrant ultrasound was recommended and ordered.  She did also have a CT of the L-spine which showed no acute fracture or static subluxation of the lumbar spine but did show severe bilateral L5-S1 neuroforaminal stenosis.  She underwent a right upper quadrant ultrasound which showed findings concerning for acute simple cystitis.  Yesterday evaluated her WBC went from 11.2-19.6.  She was taken for surgical intervention today and was found to have gangrenous cholecystitis.  General surgery recommended 5 days of antibiotics postoperatively and recommended clears today and  keeping on Zosyn while inpatient and transitioning to Augmentin at discharge.  General surgery recommended restarting DVT prophylaxis tomorrow and continuing as needed pain.  We will watch for another 24 to 48 hours pending how her clinical course and goals  Assessment & Plan:   Active Problems:   Acute cholecystitis  Acute Gangrenous Cholecystitis -She presented with nausea, vomiting and abdominal pain as well as diarrhea and had a leukocytosis; leukocytosis worsened and went from 11.2-19.6 as below -CT of the abdomen pelvis done and showed possible cholecystitis given that she had a distal gallbladder with cholelithiasis and mild intrahepatic biliary dilatation -Right upper quadrant ultrasound was ordered and done and showed "Cholelithiasis and sludge in gallbladder with gallbladder wall thickening and focal tenderness over the gallbladder. These are findings raising concern for acute cholecystitis. Increased liver echogenicity, a finding likely indicative of hepatic steatosis. No focal liver lesions evident. Note that sensitivity of ultrasound for detection of focal liver lesions is diminished in this circumstance." -SHe was started on IV Zosyn and will continue  -SARS-CoV-2 Testing Negative  -WBC was 11.2 and worsened to 19.6; She did spike a Temperature of 100.9 yesterday afternoon -Blood cultures x2 were not obtained prior to admission -U/A was done and showed a clear appearance with greater than 500 glucose, small hemoglobin, 20 ketones, negative leukocytes, negative nitrites and no bacteria seen -Continue normal saline at 75 MLS per hour for now and have discontinued her hydrochlorothiazide -continue with pain control and patient has oxycodone IR 5 mg p.o. every 4h as needed for moderate pain and IV morphine 2 mg every 2 as needed severe pain general  surgery has added fentanyl 25 to 50 mcg IV every 5 minutes as needed for pain score greater than 4 for 6 doses in the PACU -Patient also  received preoperative antibiotics with cefotetan -Continue supportive care and also continue with antiemetics 4 mg p.o./IV every 6 as needed nausea -Appreciate surgery recommendations -She was NPO for surgical intervention and she underwent surgical laparoscopic cholecystectomy found to have acute gangrenous cholecystitis -General surgery recommending continuing antibiotics for 5 days postoperatively and continuing IV Zosyn today and transitioning to Augmentin at discharge -Surgery recommending clear liquid diet for now and recommending heparin be resumed in the a.m. for VTE prophylaxis -We will continue pain control as above -General surgery recommends watching the patient 24 to 48 hours pending how the patient is feeling and to ensure that she does not develop a postoperative ileus  Hypertension -Continue with home amlodipine 5 mg p.o. twice daily, and irbesartan 150 mg p.o. daily -We will hold home hydrochlorothiazide given that she is getting IV fluid hydration -Continue to monitor blood pressures per protocol -Last blood pressure reading was 123/65 this morning  HLD -Continue Atorvastatin 20 mg p.o. daily  Diabetes Mellitus Type II -Continue with Levemir 10 units sq qHS and with moderate NovoLog sliding scale insulin before meals and at bedtime  -Last HbA1c was 6.4 a few months ago -CBG's range from 215-278   CKD Stage 3a -Patient's BUN/Cr is stable at 14/0.75 and this AM went to 19/0.84 -Avoid nephrotoxic medications, contrast dyes, hypotension and renally dose medications -Currently getting normal saline at 75 MLS per hour -Continue to Monitor and Trend Renal Fxn -Repeat CMP in the AM   Hx of CVA -C/w Atorvastatin 20 mg po Daily  Hypokalemia -Patient's K+ this AM was 3.4 -Replete with IV KCl 30 mEQ and IV K Phos 20 mmol -Mag Level was 1.7 so she got IV Mag Sulfate 2 grams -Continue to Monitor and Replete as Necessary -Repeat CMP in the  AM  Hypophosphatemia -Patient's Phos Level was 2.2 -Replete with IV K Phos 20 mmol -Continue to Monitor and Replete as Necessary -Repeat Phos Level in the AM   Obesity -Complicates overall prognosis and care -Estimated body mass index is 30.99 kg/m as calculated from the following:   Height as of this encounter: 5\' 6"  (1.676 m).   Weight as of this encounter: 87.1 kg. -Weight Loss and Dietary Counseling given   DVT prophylaxis: Heparin to be resumed in the AM; SCDs today  Code Status: FULL CODE Family Communication: Discussed with family at bedside Disposition Plan: Pending Clinical Improvement and Surgical Clearance as well as evaluation by PT/OT  Status is: Observation  The patient will require care spanning > 2 midnights and should be moved to inpatient because: Unsafe d/c plan, IV treatments appropriate due to intensity of illness or inability to take PO and Inpatient level of care appropriate due to severity of illness  Dispo: The patient is from: Home              Anticipated d/c is to: TBD              Patient currently is not medically stable to d/c.   Difficult to place patient No  Consultants:   General Surgery    Procedures: Laparoscopic Cholecystectomy  Antimicrobials:  Anti-infectives (From admission, onward)   Start     Dose/Rate Route Frequency Ordered Stop   07/01/20 0600  cefoTEtan (CEFOTAN) 2 g in sodium chloride 0.9 % 100 mL IVPB  2 g 200 mL/hr over 30 Minutes Intravenous On call to O.R. 06/30/20 1902 07/02/20 0559   06/30/20 0600  piperacillin-tazobactam (ZOSYN) IVPB 3.375 g  Status:  Discontinued        3.375 g 100 mL/hr over 30 Minutes Intravenous Every 8 hours 06/30/20 0537 06/30/20 0539   06/30/20 0600  [MAR Hold]  piperacillin-tazobactam (ZOSYN) IVPB 3.375 g        (MAR Hold since Thu 07/01/2020 at 0644.Hold Reason: Transfer to a Procedural area.)   3.375 g 12.5 mL/hr over 240 Minutes Intravenous Every 8 hours 06/30/20 0539           Subjective: Seen and examined at bedside and she had just come back from her surgical procedure and was feeling okay.  Was complaining about some indigestion and reflux.  States her belly is sore.  No chest pain or shortness of breath.  No lightheadedness or dizziness.  No other concerns or complaints at this time.  Objective: Vitals:   06/30/20 2147 07/01/20 0307 07/01/20 0637 07/01/20 0700  BP: (!) 126/49 (!) 127/50 (!) 138/55   Pulse: 91 91 87 91  Resp: 20 19 18  (!) 25  Temp: 99.6 F (37.6 C) (!) 97.3 F (36.3 C) 98.8 F (37.1 C)   TempSrc: Oral     SpO2: 94% 96% 94% 92%  Weight:      Height:        Intake/Output Summary (Last 24 hours) at 07/01/2020 7564 Last data filed at 07/01/2020 0400 Gross per 24 hour  Intake 543.45 ml  Output --  Net 543.45 ml   Filed Weights   06/29/20 2304  Weight: 87.1 kg   Examination: Physical Exam:  Constitutional: WN/WD obese African-American female currently in no acute distress appears calm and mildly uncomfortable Eyes: Lids and conjunctivae normal, sclerae anicteric  ENMT: External Ears, Nose appear normal. Grossly normal hearing. Neck: Appears normal, supple, no cervical masses, normal ROM, no appreciable thyromegaly; no JVD Respiratory: Diminished to auscultation bilaterally with coarse breath sounds, no wheezing, rales, rhonchi or crackles. Normal respiratory effort and patient is not tachypenic. No accessory muscle use.  Cardiovascular: RRR, no murmurs / rubs / gallops. S1 and S2 auscultated.  Has trace extremity edema Abdomen: Soft, tender to palpate, distended secondary body habitus.  Abdominal incisions appear clean dry and intact bowel sounds positive.  GU: Deferred. Musculoskeletal: No clubbing / cyanosis of digits/nails. No joint deformity upper and lower extremities.  Skin: No rashes, lesions, ulcers on limited skin evaluation. No induration; Warm and dry.  Neurologic: CN 2-12 grossly intact with no focal deficits. Romberg  sign and cerebellar reflexes not assessed.  Psychiatric: Normal judgment and insight. Alert and oriented x 3. Normal mood and appropriate affect.   Data Reviewed: I have personally reviewed following labs and imaging studies  CBC: Recent Labs  Lab 06/30/20 0014 07/01/20 0611  WBC 11.2* 19.6*  NEUTROABS  --  16.7*  HGB 12.5 12.0  HCT 40.4 38.5  MCV 87.8 87.9  PLT 236 332   Basic Metabolic Panel: Recent Labs  Lab 06/30/20 0014 07/01/20 0611  NA 137 135  K 3.6 3.4*  CL 103 101  CO2 25 26  GLUCOSE 306* 226*  BUN 14 19  CREATININE 0.75 0.84  CALCIUM 9.6 8.9  MG  --  1.7  PHOS  --  2.2*   GFR: Estimated Creatinine Clearance: 58.4 mL/min (by C-G formula based on SCr of 0.84 mg/dL). Liver Function Tests: Recent Labs  Lab 06/30/20 0014  07/01/20 0611  AST 15 13*  ALT 17 13  ALKPHOS 70 56  BILITOT 1.0 1.2  PROT 7.2 6.3*  ALBUMIN 4.1 3.2*   Recent Labs  Lab 06/30/20 0014  LIPASE 27   No results for input(s): AMMONIA in the last 168 hours. Coagulation Profile: No results for input(s): INR, PROTIME in the last 168 hours. Cardiac Enzymes: No results for input(s): CKTOTAL, CKMB, CKMBINDEX, TROPONINI in the last 168 hours. BNP (last 3 results) No results for input(s): PROBNP in the last 8760 hours. HbA1C: No results for input(s): HGBA1C in the last 72 hours. CBG: Recent Labs  Lab 06/29/20 2304 06/30/20 1159 06/30/20 1650 06/30/20 2150 07/01/20 0650  GLUCAP 278* 272* 248* 259* 215*   Lipid Profile: No results for input(s): CHOL, HDL, LDLCALC, TRIG, CHOLHDL, LDLDIRECT in the last 72 hours. Thyroid Function Tests: No results for input(s): TSH, T4TOTAL, FREET4, T3FREE, THYROIDAB in the last 72 hours. Anemia Panel: No results for input(s): VITAMINB12, FOLATE, FERRITIN, TIBC, IRON, RETICCTPCT in the last 72 hours. Sepsis Labs: No results for input(s): PROCALCITON, LATICACIDVEN in the last 168 hours.  Recent Results (from the past 240 hour(s))  SARS  CORONAVIRUS 2 (TAT 6-24 HRS) Nasopharyngeal Nasopharyngeal Swab     Status: None   Collection Time: 06/30/20  5:50 AM   Specimen: Nasopharyngeal Swab  Result Value Ref Range Status   SARS Coronavirus 2 NEGATIVE NEGATIVE Final    Comment: (NOTE) SARS-CoV-2 target nucleic acids are NOT DETECTED.  The SARS-CoV-2 RNA is generally detectable in upper and lower respiratory specimens during the acute phase of infection. Negative results do not preclude SARS-CoV-2 infection, do not rule out co-infections with other pathogens, and should not be used as the sole basis for treatment or other patient management decisions. Negative results must be combined with clinical observations, patient history, and epidemiological information. The expected result is Negative.  Fact Sheet for Patients: SugarRoll.be  Fact Sheet for Healthcare Providers: https://www.woods-mathews.com/  This test is not yet approved or cleared by the [place] FDA and  has been authorized for detection and/or diagnosis of SARS-CoV-2 by FDA under an Emergency Use Authorization (EUA). This EUA will remain  in effect (meaning this test can be used) for the duration of the COVID-19 declaration under Se ction 564(b)(1) of the Act, 21 U.S.C. section 360bbb-3(b)(1), unless the authorization is terminated or revoked sooner.  Performed at [facility], [place] [address] [place], [place] [address]   MRSA PCR Screening     Status: None   Collection Time: 07/01/20  2:57 AM   Specimen: Nasal Mucosa; Nasopharyngeal  Result Value Ref Range Status   MRSA by PCR NEGATIVE NEGATIVE Final    Comment:        The GeneXpert MRSA Assay (FDA approved for NASAL specimens only), is one component of a comprehensive MRSA colonization surveillance program. It is not intended to diagnose MRSA infection nor to guide or monitor treatment for MRSA infections. Performed at [facility], [address] [place], [place] [address]     RN Pressure Injury Documentation:     Estimated body mass index is 30.99 kg/m as calculated from the following:   Height as of this encounter: 5\' 6"  (1.676 m).   Weight as of this encounter: 87.1 kg.  Malnutrition Type:   Malnutrition Characteristics:   Nutrition Interventions:    Radiology Studies: CT Abdomen Pelvis W Contrast  Result Date: 06/30/2020 CLINICAL DATA:  Acute abdominal pain EXAM: CT ABDOMEN AND PELVIS  WITH CONTRAST TECHNIQUE: Multidetector CT imaging of the abdomen and pelvis was performed using the standard protocol following bolus administration of intravenous contrast. CONTRAST:  156mL OMNIPAQUE IOHEXOL 300 MG/ML  SOLN COMPARISON:  04/09/2012 FINDINGS: LOWER CHEST: Normal. HEPATOBILIARY: Distended gallbladder with cholelithiasis. Mild intrahepatic biliary dilatation . no focal liver abnormality. Common bile duct is normal caliber. PANCREAS: Normal pancreas. No ductal dilatation or peripancreatic fluid collection. SPLEEN: Normal. ADRENALS/URINARY TRACT: The adrenal glands are normal. No hydronephrosis, nephroureterolithiasis or solid renal mass. The urinary bladder is normal for degree of distention STOMACH/BOWEL: No hiatal hernia. There is a diverticulum of the third segment of the duodenum. No small bowel dilatation or inflammation. No focal colonic abnormality. Normal appendix. VASCULAR/LYMPHATIC: There is calcific atherosclerosis of the abdominal aorta. No lymphadenopathy. REPRODUCTIVE: Multiple calcified uterine fibroids. MUSCULOSKELETAL. Multilevel degenerative disc disease and facet arthrosis. No bony spinal canal stenosis. OTHER: None. IMPRESSION: 1. Distended gallbladder with cholelithiasis and mild intrahepatic biliary dilatation. If there is concern for acute cholecystitis, consider right upper quadrant ultrasound. 2. Fibroid uterus. Aortic atherosclerosis (ICD10-I70.0). Electronically Signed   By: Ulyses Jarred M.D.   On: 06/30/2020  03:21   CT L-SPINE NO CHARGE  Result Date: 06/30/2020 CLINICAL DATA:  Right flank and back pain after fall EXAM: CT LUMBAR SPINE WITHOUT CONTRAST TECHNIQUE: Multidetector CT imaging of the lumbar spine was performed without intravenous contrast administration. Multiplanar CT image reconstructions were also generated. COMPARISON:  None. FINDINGS: Segmentation: 5 lumbar type vertebrae. Alignment: Normal. Vertebrae: No acute fracture or focal pathologic process. Paraspinal and other soft tissues: Calcific aortic atherosclerosis. Uterine fibroids. Disc levels: There is mild spinal canal stenosis at L4-5. There is severe bilateral L5-S1 neural foraminal stenosis. IMPRESSION: 1. No acute fracture or static subluxation of the lumbar spine. 2. Severe bilateral L5-S1 neural foraminal stenosis. Aortic Atherosclerosis (ICD10-I70.0). Electronically Signed   By: Ulyses Jarred M.D.   On: 06/30/2020 02:57   US Abdomen Limited RUQ (LIVER/GB)  Result Date: 06/30/2020 CLINICAL DATA:  Upper abdominal pain EXAM: ULTRASOUND ABDOMEN LIMITED RIGHT UPPER QUADRANT COMPARISON:  CT abdomen and pelvis Jun 30, 2020 FINDINGS: Gallbladder: Within the gallbladder, there are echogenic foci which move and shadow consistent with cholelithiasis. Largest gallstone measures 9 mm in length. There is gallbladder wall thickening. There is also sludge in the gallbladder. No pericholecystic fluid. Patient is focally tender over the gallbladder. Common bile duct: Diameter: 2 mm. No evident intrahepatic or extrahepatic biliary duct dilatation. Liver: No focal lesion identified. Liver echogenicity overall increased. Portal vein is patent on color Doppler imaging with normal direction of blood flow towards the liver. Other: None. IMPRESSION: 1. Cholelithiasis and sludge in gallbladder with gallbladder wall thickening and focal tenderness over the gallbladder. These are findings raising concern for acute cholecystitis. 2. Increased liver echogenicity, a  finding likely indicative of hepatic steatosis. No focal liver lesions evident. Note that sensitivity of ultrasound for detection of focal liver lesions is diminished in this circumstance. Electronically Signed   By: Lowella Grip III M.D.   On: 06/30/2020 08:17   Scheduled Meds: . [MAR Hold] amLODipine  5 mg Oral BID  . [MAR Hold] atorvastatin  20 mg Oral Daily  . chlorhexidine      . [MAR Hold] heparin  5,000 Units Subcutaneous Q8H  . [MAR Hold] insulin aspart  0-15 Units Subcutaneous TID WC  . [MAR Hold] insulin aspart  0-5 Units Subcutaneous QHS  . [MAR Hold] insulin detemir  10 Units Subcutaneous QHS  . [MAR Hold] irbesartan  150  mg Oral Daily  . [MAR Hold] pantoprazole (PROTONIX) IV  40 mg Intravenous Q24H   Continuous Infusions: . sodium chloride 75 mL/hr at 06/30/20 0956  . cefoTEtan (CEFOTAN) IV    . lactated ringers 50 mL/hr at 07/01/20 0728  . magnesium sulfate bolus IVPB    . [MAR Hold] piperacillin-tazobactam (ZOSYN)  IV 3.375 g (07/01/20 0516)  . potassium chloride    . potassium PHOSPHATE IVPB (in mmol)      LOS: 1 day   Kerney Elbe, DO [facility] PAGER is on [place]  If 7PM-7AM, please contact night-coverage [email or web address]

## 2020-07-01 NOTE — Care Management Obs Status (Signed)
[facility] NOTIFICATION   Patient Details  Name: Sandra Washington MRN: [id number] Date of Birth: June 01, 1940   Medicare Observation Status Notification Given:  Yes    Iona Beard, [place] 07/01/2020, 8:53 AM

## 2020-07-01 NOTE — Progress Notes (Signed)
Inpatient Diabetes Program Recommendations  AACE/ADA: New Consensus Statement on Inpatient Glycemic Control (2015)  Target Ranges:  Prepandial:   less than 140 mg/dL      Peak postprandial:   less than 180 mg/dL (1-2 hours)      Critically ill patients:  140 - 180 mg/dL   Lab Results  Component Value Date   GLUCAP 245 (H) 07/01/2020   HGBA1C 6.4 (A) 05/10/2020    Review of Glycemic Control Results for SERENIDY, WALTZ (MRN [id number]) as of 07/01/2020 13:25  Ref. Range 06/30/2020 16:50 06/30/2020 21:50 07/01/2020 06:50 07/01/2020 09:04 07/01/2020 11:45  Glucose-Capillary Latest Ref Range: 70 - 99 mg/dL 248 (H) 259 (H) 215 (H) 217 (H) 245 (H)   Diabetes history: DM 2 Outpatient Diabetes medications:  NPH 8 units in the AM and NPH 12 units in the PM, Novolin R 8 units with breakfast and lunch, and 16 units with dinner Current orders for Inpatient glycemic control:  Novolog moderate tid with meals and HS Levemir 10 units q HS Inpatient Diabetes Program Recommendations:    Please consider increasing Levemir to 8 units bid.   Thanks  Adah Perl, RN, BC-ADM Inpatient Diabetes Coordinator Pager [phone] (8a-5p)

## 2020-07-01 NOTE — Anesthesia Postprocedure Evaluation (Signed)
Anesthesia Post Note  Patient: Sandra Washington  Procedure(s) Performed: LAPAROSCOPIC CHOLECYSTECTOMY (N/A Abdomen)  Patient location during evaluation: PACU Anesthesia Type: General Level of consciousness: awake and alert, oriented and sedated Pain management: pain level controlled Vital Signs Assessment: post-procedure vital signs reviewed and stable Respiratory status: spontaneous breathing and respiratory function stable Cardiovascular status: blood pressure returned to baseline and stable Postop Assessment: no apparent nausea or vomiting Anesthetic complications: no   No complications documented.   Last Vitals:  Vitals:   07/01/20 0915 07/01/20 0930  BP: (!) 133/59 124/60  Pulse: 92 90  Resp: (!) 23 15  Temp:    SpO2: 96% 95%    Last Pain:  Vitals:   07/01/20 0928  TempSrc:   PainSc: 7                  Lilith Solana C Jaselyn Nahm

## 2020-07-01 NOTE — Anesthesia Preprocedure Evaluation (Signed)
Anesthesia Evaluation  Patient identified by MRN, date of birth, ID band Patient awake    Reviewed: Allergy & Precautions, NPO status , Patient's Chart, lab work & pertinent test results  History of Anesthesia Complications Negative for: history of anesthetic complications  Airway Mallampati: II  TM Distance: >3 FB Neck ROM: Full    Dental  (+) Edentulous Lower, Edentulous Upper   Pulmonary former smoker,    Pulmonary exam normal breath sounds clear to auscultation       Cardiovascular Exercise Tolerance: Good hypertension, Pt. on medications  Rhythm:Regular Rate:Normal + Systolic murmurs    Neuro/Psych CVA, Residual Symptoms    GI/Hepatic negative GI ROS, Neg liver ROS,   Endo/Other  diabetes, Poorly Controlled, Type 2, Oral Hypoglycemic Agents  Renal/GU Renal InsufficiencyRenal disease     Musculoskeletal   Abdominal   Peds  Hematology   Anesthesia Other Findings   Reproductive/Obstetrics                          Anesthesia Physical Anesthesia Plan  ASA: III  Anesthesia Plan: General   Post-op Pain Management:    Induction: Intravenous  PONV Risk Score and Plan: 4 or greater and Ondansetron and Dexamethasone  Airway Management Planned: Oral ETT  Additional Equipment:   Intra-op Plan:   Post-operative Plan: Extubation in OR  Informed Consent: I have reviewed the patients History and Physical, chart, labs and discussed the procedure including the risks, benefits and alternatives for the proposed anesthesia with the patient or authorized representative who has indicated his/her understanding and acceptance.       Plan Discussed with: CRNA and Surgeon  Anesthesia Plan Comments:        Anesthesia Quick Evaluation

## 2020-07-01 NOTE — Anesthesia Procedure Notes (Signed)
Procedure Name: Intubation Date/Time: 07/01/2020 7:40 AM Performed by: Ollen Bowl, CRNA Pre-anesthesia Checklist: Patient identified, Patient being monitored, Timeout performed, Emergency Drugs available and Suction available Patient Re-evaluated:Patient Re-evaluated prior to induction Oxygen Delivery Method: Circle System Utilized Preoxygenation: Pre-oxygenation with 100% oxygen Induction Type: IV induction Ventilation: Mask ventilation without difficulty Laryngoscope Size: Mac and 3 Grade View: Grade I Tube type: Oral Tube size: 7.0 mm Number of attempts: 1 Airway Equipment and Method: Stylet Placement Confirmation: ETT inserted through vocal cords under direct vision,  positive ETCO2 and breath sounds checked- equal and bilateral Secured at: 21 cm Tube secured with: Tape Dental Injury: Teeth and Oropharynx as per pre-operative assessment

## 2020-07-01 NOTE — Transfer of Care (Signed)
Immediate Anesthesia Transfer of Care Note  Patient: Sandra Washington  Procedure(s) Performed: LAPAROSCOPIC CHOLECYSTECTOMY (N/A Abdomen)  Patient Location: PACU  Anesthesia Type:General  Level of Consciousness: awake  Airway & Oxygen Therapy: Patient Spontanous Breathing  Post-op Assessment: Report given to RN and Post -op Vital signs reviewed and stable  Post vital signs: Reviewed and stable  Last Vitals:  Vitals Value Taken Time  BP 145/56 07/01/20 0858  Temp    Pulse 98 07/01/20 0859  Resp 21 07/01/20 0859  SpO2 91 % 07/01/20 0859  Vitals shown include unvalidated device data.  Last Pain:  Vitals:   07/01/20 0637  TempSrc:   PainSc: 8          Complications: No complications documented.

## 2020-07-01 NOTE — Progress Notes (Signed)
EKG done

## 2020-07-01 NOTE — Care Management CC44 (Signed)
Condition Code 44 Documentation Completed  Patient Details  Name: Sandra Washington MRN: [id number] Date of Birth: Dec 02, 1940   Condition Code 44 given:  Yes Patient signature on Condition Code 44 notice:  Yes Documentation of 2 MD's agreement:  Yes Code 44 added to claim:  Yes    Iona Beard, [place] 07/01/2020, 8:53 AM

## 2020-07-01 NOTE — Op Note (Addendum)
Operative Note   Preoperative Diagnosis: Acute cholecystitis    Postoperative Diagnosis: Gangrenous cholecystitis    Procedure(s) Performed: Laparoscopic cholecystectomy   Surgeon: Ria Comment C. Constance Haw, MD   Assistants: Aviva Signs, MD    Anesthesia: General endotracheal   Anesthesiologist: Denese Killings, MD    Specimens: Gallbladder    Estimated Blood Loss: Minimal    Blood Replacement: None    Complications: None    Operative Findings: Gangrenous gallbladder    Procedure: The patient was taken to the operating room and placed supine. General endotracheal anesthesia was induced. Intravenous antibiotics were administered per protocol. An orogastric tube positioned to decompress the stomach. The abdomen was prepared and draped in the usual sterile fashion.    A supraumbilical incision was made and a Veress technique was utilized to achieve pneumoperitoneum to 15 mmHg with carbon dioxide. A 11 mm optiview port was placed through the supraumbilical region, and a 10 mm 0-degree operative laparoscope was introduced. The area underlying the trocar and Veress needle were inspected and without evidence of injury.  Remaining trocars were placed under direct vision. Two 5 mm ports were placed in the right abdomen, between the anterior axillary and midclavicular line.  A final 11 mm port was placed through the mid-epigastrium, near the falciform ligament.    The gallbladder was decompressed and brown cloudy fluid was suctioned.  The gallbladder was very inflamed and had a necrotic posterior wall.  The fundus was elevated cephalad and the infundibulum was retracted to the patient's right. The gallbladder/cystic duct junction was skeletonized. The cystic artery noted in the triangle of Calot and was also skeletonized.  We then continued liberal medial and lateral dissection until the critical view of safety was achieved.    The cystic duct and cystic artery were triply clipped and divided.  A  small vessel in the posterior mesentery was clipped. The gallbladder was then dissected from the liver bed with electrocautery. The specimen was placed in an Endopouch and was retrieved through the epigastric site. Irrigation was performed.    Final inspection revealed acceptable hemostasis. [facility] SNOW was placed in the gallbladder bed.  Trocars were removed and pneumoperitoneum was released.  0 Vicryl fascial sutures were used to close the epigastric and umbilical port sites. Skin incisions were closed with 4-0 Monocryl subcuticular sutures and Dermabond. The patient was awakened from anesthesia and extubated without complication.    Curlene Labrum, MD [address] [place] [facility] [address] [place], [place] [address] [phone] (office)

## 2020-07-01 NOTE — Progress Notes (Signed)
[facility]  Updated Sandra Washington. Gangrenous cholecystitis. Will need to have 5 days post op antibiotics per the STOP IT trial.  Will have clears today, will keep zosyn while inpatient and transition to augmentin at dc. Heparin can start back tomorrow.  Prn for pain. Labs tomorrow.   Maybe home in next 24-48 hours pending how she is feeling. Updated Dr. Tawnya Crook, MD [facility] [address] [place], [place] [address] [phone] (office)

## 2020-07-02 DIAGNOSIS — K810 Acute cholecystitis: Secondary | ICD-10-CM | POA: Diagnosis not present

## 2020-07-02 DIAGNOSIS — E876 Hypokalemia: Secondary | ICD-10-CM

## 2020-07-02 DIAGNOSIS — D649 Anemia, unspecified: Secondary | ICD-10-CM

## 2020-07-02 LAB — CBC WITH DIFFERENTIAL/PLATELET
Abs Immature Granulocytes: 0.06 10*3/uL (ref 0.00–0.07)
Basophils Absolute: 0 10*3/uL (ref 0.0–0.1)
Basophils Relative: 0 %
Eosinophils Absolute: 0.1 10*3/uL (ref 0.0–0.5)
Eosinophils Relative: 1 %
HCT: 35.2 % — ABNORMAL LOW (ref 36.0–46.0)
Hemoglobin: 10.9 g/dL — ABNORMAL LOW (ref 12.0–15.0)
Immature Granulocytes: 1 %
Lymphocytes Relative: 13 %
Lymphs Abs: 1.2 10*3/uL (ref 0.7–4.0)
MCH: 27.9 pg (ref 26.0–34.0)
MCHC: 31 g/dL (ref 30.0–36.0)
MCV: 90.3 fL (ref 80.0–100.0)
Monocytes Absolute: 0.6 10*3/uL (ref 0.1–1.0)
Monocytes Relative: 7 %
Neutro Abs: 7.4 10*3/uL (ref 1.7–7.7)
Neutrophils Relative %: 78 %
Platelets: 171 10*3/uL (ref 150–400)
RBC: 3.9 MIL/uL (ref 3.87–5.11)
RDW: 14.1 % (ref 11.5–15.5)
WBC: 9.4 10*3/uL (ref 4.0–10.5)
nRBC: 0 % (ref 0.0–0.2)

## 2020-07-02 LAB — COMPREHENSIVE METABOLIC PANEL
ALT: 26 U/L (ref 0–44)
AST: 26 U/L (ref 15–41)
Albumin: 2.5 g/dL — ABNORMAL LOW (ref 3.5–5.0)
Alkaline Phosphatase: 48 U/L (ref 38–126)
Anion gap: 5 (ref 5–15)
BUN: 18 mg/dL (ref 8–23)
CO2: 24 mmol/L (ref 22–32)
Calcium: 8.1 mg/dL — ABNORMAL LOW (ref 8.9–10.3)
Chloride: 104 mmol/L (ref 98–111)
Creatinine, Ser: 0.8 mg/dL (ref 0.44–1.00)
GFR, Estimated: >60 mL/min (ref >60)
Glucose, Bld: 179 mg/dL — ABNORMAL HIGH (ref 70–99)
Potassium: 3.8 mmol/L (ref 3.5–5.1)
Sodium: 133 mmol/L — ABNORMAL LOW (ref 135–145)
Total Bilirubin: 0.8 mg/dL (ref 0.3–1.2)
Total Protein: 5.5 g/dL — ABNORMAL LOW (ref 6.5–8.1)

## 2020-07-02 LAB — GLUCOSE, CAPILLARY
Glucose-Capillary: 164 mg/dL — ABNORMAL HIGH (ref 70–99)
Glucose-Capillary: 114 mg/dL — ABNORMAL HIGH (ref 70–99)
Glucose-Capillary: 220 mg/dL — ABNORMAL HIGH (ref 70–99)
Glucose-Capillary: 123 mg/dL — ABNORMAL HIGH (ref 70–99)

## 2020-07-02 LAB — SURGICAL PATHOLOGY

## 2020-07-02 LAB — MAGNESIUM: Magnesium: 2.1 mg/dL (ref 1.7–2.4)

## 2020-07-02 LAB — PHOSPHORUS: Phosphorus: 2.4 mg/dL — ABNORMAL LOW (ref 2.5–4.6)

## 2020-07-02 MED ORDER — DOCUSATE SODIUM 100 MG PO CAPS
100.0000 mg | ORAL_CAPSULE | Freq: Two times a day (BID) | ORAL | Status: DC
  Administered 2020-07-02 – 2020-07-03 (×2): 100 mg via ORAL
  Filled 2020-07-02 (×2): qty 1

## 2020-07-02 NOTE — Progress Notes (Signed)
[facility]  Feeling better but sore. Tolerating diet. No flatus or Bm. Up in the chair.   Antibiotics for 5 days post op. Diet as tolerated Colace added.  Will monitor until eating and having BM.  Discussed with Dr. Alfredia Ferguson.   Sandra Labrum, MD [facility] [address] [place], [place] [address] [phone] (office)

## 2020-07-02 NOTE — Care Management Important Message (Signed)
Important Message  Patient Details  Name: Sandra Washington MRN: [id number] Date of Birth: 04-Apr-1940   Medicare Important Message Given:  Yes     Tommy Medal 07/02/2020, 11:27 AM

## 2020-07-02 NOTE — Progress Notes (Signed)
PROGRESS NOTE    SEE BEHARRY  MWN:[id number] DOB: 11-Jun-1940 DOA: 06/29/2020 PCP: Lucianne Lei, MD  Brief Narrative:  The patient is an 80 year old obese African-American female with a past medical history significant for but not limited to CVA, history of hypertension, diabetes mellitus type 2, CKD stage IIIa, as well as other comorbidities who presented with a chief complaint of nausea vomiting.  She reports that her symptoms started last weekend and been admitted and unpredictable.  She feels that they are not related to mealtime and she reports that she has had her esophagus stretched in the past and she thought she was having symptoms from that.  She reported that she had some moisture and she thought maybe she had pulled poisoning and yesterday her symptoms came on with more intensity and have severe right upper quadrant pain that was sharp in nature and also described as a pressure.  She had nausea and vomiting in the.  As undigested food and also had diarrhea without blood.  She last reported her normal meal was dinner and that her appetite has been intact.  In the ED she came in and she had an elevated leukocytosis of 11.2 and chemistry panel unremarkable.  CT scan abdomen pelvis did show distended gallbladder with cholelithiasis and mild intrahepatic biliary dilatation and possibly acute cholecystitis.  Right upper quadrant ultrasound was recommended and ordered.  She did also have a CT of the L-spine which showed no acute fracture or static subluxation of the lumbar spine but did show severe bilateral L5-S1 neuroforaminal stenosis.  She underwent a right upper quadrant ultrasound which showed findings concerning for acute simple cystitis.  Yesterday evaluated her WBC went from 11.2-19.6.  She was taken for surgical intervention today and was found to have gangrenous cholecystitis.  General surgery recommended 5 days of antibiotics postoperatively and recommended clears today and  keeping on Zosyn while inpatient and transitioning to Augmentin at discharge.  General surgery recommended restarting DVT prophylaxis today and continuing as needed pain control. Will get PT/OT Evaluation.   Assessment & Plan:   Principal Problem:   Gangrenous cholecystitis Active Problems:   Acute gangrenous cholecystitis  Acute Gangrenous Cholecystitis  -She presented with nausea, vomiting and abdominal pain as well as diarrhea and had a leukocytosis; leukocytosis worsened and went from 11.2 ->19.6 -> 9.4 -CT of the abdomen pelvis done and showed possible cholecystitis given that she had a distal gallbladder with cholelithiasis and mild intrahepatic biliary dilatation -Right upper quadrant ultrasound was ordered and done and showed "Cholelithiasis and sludge in gallbladder with gallbladder wall thickening and focal tenderness over the gallbladder. These are findings raising concern for acute cholecystitis. Increased liver echogenicity, a finding likely indicative of hepatic steatosis. No focal liver lesions evident. Note that sensitivity of ultrasound for detection of focal liver lesions is diminished in this circumstance." -SHe was started on IV Zosyn and will continue  -SARS-CoV-2 Testing Negative  -WBC was 11.2 and worsened to 19.6 and now improved to 9.4; She did spike a Temperature of 100.9 early in the hospitalization prior to surgery  -Blood cultures x2 were not obtained prior to admission -U/A was done and showed a clear appearance with greater than 500 glucose, small hemoglobin, 20 ketones, negative leukocytes, negative nitrites and no bacteria seen -Continue normal saline at 75 MLS per hour for now and have discontinued her hydrochlorothiazide -continue with pain control and patient has oxycodone IR 5 mg p.o. every 4h as needed for moderate pain and IV morphine  2 mg every 2 as needed severe pain general surgery has added fentanyl 25 to 50 mcg IV every 5 minutes as needed for pain score  greater than 4 for 6 doses in the PACU -Patient also received preoperative antibiotics with cefotetan -Continue supportive care and also continue with antiemetics 4 mg p.o./IV every 6 as needed nausea -Appreciate surgery recommendations -She was NPO for surgical intervention on 07/01/20 and she underwent surgical laparoscopic cholecystectomy found to have acute gangrenous cholecystitis -General surgery recommending continuing antibiotics for 5 days postoperatively and continuing IV Zosyn while hospitalized and transitioning to Augmentin at discharge -Surgery recommending clear liquid diet for now and will defer Diet Advancement per their recommendations; Patient states she is still not had a bowel movement and has not really passed flatus -VTE prophylaxis with Heparin 5,000 units resumed  -We will continue pain control as above -General surgery yesterday recommends watching the patient 24 to 48 hours pending how the patient is feeling and to ensure that she does not develop a postoperative ileus; Will discharge home when cleared by Surgery -PT/OT to evaluate and treat   Hypertension -Continue with home amlodipine 5 mg p.o. twice daily, and irbesartan 150 mg p.o. daily -We will hold home hydrochlorothiazide given that she is getting IV fluid hydration with NS at 75 mL/hr -Continue to monitor blood pressures per protocol -Last blood pressure reading was 108/55 this morning  HLD -Continue Atorvastatin 20 mg p.o. daily  Diabetes Mellitus Type II -Continue with Levemir 10 units sq qHS and with moderate NovoLog sliding scale insulin before meals and at bedtime  -Last HbA1c was 6.4 a few months ago -CBG's range from 164-245   CKD Stage 3a -Patient's BUN/Cr is stable at 18/0.80 -Avoid nephrotoxic medications, contrast dyes, hypotension and renally dose medications -Currently getting normal saline at 75 MLS per hour -Continue to Monitor and Trend Renal Fxn -Repeat CMP in the AM   Hx of  CVA -C/w Atorvastatin 20 mg po Daily  Normocytic Anemia -? Anemia of CKD vs. Dilutional Drop as patient has been getting IVF or Post-Surgical drop -Patient's Hgb/Hct went from 12.0/38.5 -> 10.9/35.2 -Check Anemia Panel in the AM -Continue to Monitor for S/Sx of Bleeding; No overt bleeding noted -Repeat CBC in the AM  Hyponatremia -Patient's Na+ went from 135 -> 133 -She remains on NS at 75 mL/hr -Continue to Monitor and Trend -Repeat CMP int he AM   Hypokalemia, improved  -Patient's K+ this AM was 3.8 and improved from 3.4 -Replete with IV KCl 30 mEQ and IV K Phos 20 mmol yesterday -Mag Level was 1.7 so she got IV Mag Sulfate 2 grams yesterday as well; Repeat Mag pending  -Continue to Monitor and Replete as Necessary -Repeat CMP in the AM  Hypophosphatemia -Patient's Phos Level was 2.2 and repeat this AM is pending -Replete with IV K Phos 20 mmol yesterday  -Continue to Monitor and Replete as Necessary -Repeat Phos Level in the AM   Obesity -Complicates overall prognosis and care -Estimated body mass index is 30.99 kg/m as calculated from the following:   Height as of this encounter: 5\' 6"  (1.676 m).   Weight as of this encounter: 87.1 kg. -Weight Loss and Dietary Counseling given   DVT prophylaxis: Heparin 5,000 units sq q8h  Code Status: FULL CODE Family Communication: Discussed with family at bedside Disposition Plan: Pending Clinical Improvement and Surgical Clearance as well as evaluation by PT/OT  Status is: Inpatient  Remains inpatient appropriate because:Unsafe d/c plan, IV  treatments appropriate due to intensity of illness or inability to take PO and Inpatient level of care appropriate due to severity of illness   Dispo: The patient is from: Home              Anticipated d/c is to: Home              Patient currently is not medically stable to d/c.   Difficult to place patient No  Consultants:   General Surgery    Procedures: Laparoscopic  Cholecystectomy  Antimicrobials:  Anti-infectives (From admission, onward)   Start     Dose/Rate Route Frequency Ordered Stop   07/01/20 0600  cefoTEtan (CEFOTAN) 2 g in sodium chloride 0.9 % 100 mL IVPB  Status:  Discontinued        2 g 200 mL/hr over 30 Minutes Intravenous On call to O.R. 06/30/20 1902 07/01/20 1007   06/30/20 0600  piperacillin-tazobactam (ZOSYN) IVPB 3.375 g  Status:  Discontinued        3.375 g 100 mL/hr over 30 Minutes Intravenous Every 8 hours 06/30/20 0537 06/30/20 0539   06/30/20 0600  piperacillin-tazobactam (ZOSYN) IVPB 3.375 g        3.375 g 12.5 mL/hr over 240 Minutes Intravenous Every 8 hours 06/30/20 0539          Subjective: Seen and examined at bedside and states she was doing ok.  Her abdomen is still sore.  No nausea or vomiting now.  States that she did not sleep very well last night as she kept getting started in the looking.  No lightheadedness or dizziness.  States that she has not had a bowel movement yet and has not been really passing any flatus.  No other concerns or complaints at this time.  Objective: Vitals:   07/01/20 2021 07/01/20 2027 07/02/20 0444 07/02/20 0803  BP: (!) 117/49 121/60 (!) 124/43 (!) 108/55  Pulse: 89 85 73 70  Resp: 18 18 18    Temp: 98.4 F (36.9 C) 97.9 F (36.6 C) 98.5 F (36.9 C)   TempSrc: Oral  Oral   SpO2: 100% 97% 95%   Weight:      Height:        Intake/Output Summary (Last 24 hours) at 07/02/2020 0813 Last data filed at 07/02/2020 0500 Gross per 24 hour  Intake 3036.82 ml  Output 720 ml  Net 2316.82 ml   Filed Weights   06/29/20 2304  Weight: 87.1 kg   Examination: Physical Exam:  Constitutional: WN/WD obese AAF in NAD appears calm and comfortable  Eyes: Lids and conjunctivae normal, sclerae anicteric  ENMT: External Ears, Nose appear normal. Grossly normal hearing. Neck: Appears normal, supple, no cervical masses, normal ROM, no appreciable thyromegaly; no JVD Respiratory: Diminished to  auscultation bilaterally with coarse breath sounds, no wheezing, rales, rhonchi or crackles. Normal respiratory effort and patient is not tachypenic. No accessory muscle use.  Cardiovascular: RRR, no murmurs / rubs / gallops. S1 and S2 auscultated. No appreciable LE Edema  Abdomen: Soft, mildly tender, Distended 2/2 to body habitus. Bowel sounds positive but slightly diminished.  GU: Deferred. Musculoskeletal: No clubbing / cyanosis of digits/nails. No joint deformity upper and lower extremities.   Skin: No rashes, lesions, ulcers on a limited skin evaluation. No induration; Warm and dry.  Neurologic: CN 2-12 grossly intact with no focal deficits. Romberg sign and cerebellar reflexes not assessed.  Psychiatric: Normal judgment and insight. Alert and oriented x 3. Normal mood and appropriate affect.  Data Reviewed: I have personally reviewed following labs and imaging studies  CBC: Recent Labs  Lab 06/30/20 0014 07/01/20 0611 07/02/20 0536  WBC 11.2* 19.6* 9.4  NEUTROABS  --  16.7* 7.4  HGB 12.5 12.0 10.9*  HCT 40.4 38.5 35.2*  MCV 87.8 87.9 90.3  PLT 236 220 536   Basic Metabolic Panel: Recent Labs  Lab 06/30/20 0014 07/01/20 0611 07/02/20 0536  NA 137 135 133*  K 3.6 3.4* 3.8  CL 103 101 104  CO2 25 26 24   GLUCOSE 306* 226* 179*  BUN 14 19 18   CREATININE 0.75 0.84 0.80  CALCIUM 9.6 8.9 8.1*  MG  --  1.7  --   PHOS  --  2.2*  --    GFR: Estimated Creatinine Clearance: 61.3 mL/min (by C-G formula based on SCr of 0.8 mg/dL). Liver Function Tests: Recent Labs  Lab 06/30/20 0014 07/01/20 0611 07/02/20 0536  AST 15 13* 26  ALT 17 13 26   ALKPHOS 70 56 48  BILITOT 1.0 1.2 0.8  PROT 7.2 6.3* 5.5*  ALBUMIN 4.1 3.2* 2.5*   Recent Labs  Lab 06/30/20 0014  LIPASE 27   No results for input(s): AMMONIA in the last 168 hours. Coagulation Profile: No results for input(s): INR, PROTIME in the last 168 hours. Cardiac Enzymes: No results for input(s): CKTOTAL, CKMB,  CKMBINDEX, TROPONINI in the last 168 hours. BNP (last 3 results) No results for input(s): PROBNP in the last 8760 hours. HbA1C: No results for input(s): HGBA1C in the last 72 hours. CBG: Recent Labs  Lab 07/01/20 0904 07/01/20 1145 07/01/20 1640 07/01/20 2029 07/02/20 0733  GLUCAP 217* 245* 233* 172* 164*   Lipid Profile: No results for input(s): CHOL, HDL, LDLCALC, TRIG, CHOLHDL, LDLDIRECT in the last 72 hours. Thyroid Function Tests: No results for input(s): TSH, T4TOTAL, FREET4, T3FREE, THYROIDAB in the last 72 hours. Anemia Panel: No results for input(s): VITAMINB12, FOLATE, FERRITIN, TIBC, IRON, RETICCTPCT in the last 72 hours. Sepsis Labs: No results for input(s): PROCALCITON, LATICACIDVEN in the last 168 hours.  Recent Results (from the past 240 hour(s))  SARS CORONAVIRUS 2 (TAT 6-24 HRS) Nasopharyngeal Nasopharyngeal Swab     Status: None   Collection Time: 06/30/20  5:50 AM   Specimen: Nasopharyngeal Swab  Result Value Ref Range Status   SARS Coronavirus 2 NEGATIVE NEGATIVE Final    Comment: (NOTE) SARS-CoV-2 target nucleic acids are NOT DETECTED.  The SARS-CoV-2 RNA is generally detectable in upper and lower respiratory specimens during the acute phase of infection. Negative results do not preclude SARS-CoV-2 infection, do not rule out co-infections with other pathogens, and should not be used as the sole basis for treatment or other patient management decisions. Negative results must be combined with clinical observations, patient history, and epidemiological information. The expected result is Negative.  Fact Sheet for Patients: SugarRoll.be  Fact Sheet for Healthcare Providers: https://www.woods-mathews.com/  This test is not yet approved or cleared by the [place] FDA and  has been authorized for detection and/or diagnosis of SARS-CoV-2 by FDA under an Emergency Use Authorization (EUA). This EUA will remain   in effect (meaning this test can be used) for the duration of the COVID-19 declaration under Se ction 564(b)(1) of the Act, 21 U.S.C. section 360bbb-3(b)(1), unless the authorization is terminated or revoked sooner.  Performed at [facility], [place] [address], [place], [place] [address]   MRSA PCR Screening     Status: None   Collection Time: 07/01/20  2:62 AM   Specimen: Nasal Mucosa; Nasopharyngeal  Result Value Ref Range Status   MRSA by PCR NEGATIVE NEGATIVE Final    Comment:        The GeneXpert MRSA Assay (FDA approved for NASAL specimens only), is one component of a comprehensive MRSA colonization surveillance program. It is not intended to diagnose MRSA infection nor to guide or monitor treatment for MRSA infections. Performed at [facility], [address] [place], [place] [address]     RN Pressure Injury Documentation:     Estimated body mass index is 30.99 kg/m as calculated from the following:   Height as of this encounter: 5\' 6"  (1.676 m).   Weight as of this encounter: 87.1 kg.  Malnutrition Type:   Malnutrition Characteristics:   Nutrition Interventions:    Radiology Studies: No results found. Scheduled Meds: . acetaminophen  1,000 mg Oral Q6H  . amLODipine  5 mg Oral BID  . atorvastatin  20 mg Oral Daily  . heparin  5,000 Units Subcutaneous Q8H  . insulin aspart  0-15 Units Subcutaneous TID WC  . insulin aspart  0-5 Units Subcutaneous QHS  . insulin detemir  8 Units Subcutaneous BID  . irbesartan  150 mg Oral Daily  . pantoprazole (PROTONIX) IV  40 mg Intravenous Q24H   Continuous Infusions: . sodium chloride 75 mL/hr at 07/01/20 2037  . piperacillin-tazobactam (ZOSYN)  IV 3.375 g (07/02/20 [id number])    LOS: 2 days   Kerney Elbe, DO [facility] PAGER is on AMION  If 7PM-7AM, please contact night-coverage [email or web address]

## 2020-07-03 DIAGNOSIS — D649 Anemia, unspecified: Secondary | ICD-10-CM | POA: Diagnosis not present

## 2020-07-03 DIAGNOSIS — K810 Acute cholecystitis: Secondary | ICD-10-CM | POA: Diagnosis not present

## 2020-07-03 DIAGNOSIS — E876 Hypokalemia: Secondary | ICD-10-CM | POA: Diagnosis not present

## 2020-07-03 LAB — CBC WITH DIFFERENTIAL/PLATELET
Abs Immature Granulocytes: 0.09 10*3/uL — ABNORMAL HIGH (ref 0.00–0.07)
Basophils Absolute: 0.1 10*3/uL (ref 0.0–0.1)
Basophils Relative: 1 %
Eosinophils Absolute: 0.2 10*3/uL (ref 0.0–0.5)
Eosinophils Relative: 2 %
HCT: 34.7 % — ABNORMAL LOW (ref 36.0–46.0)
Hemoglobin: 10.7 g/dL — ABNORMAL LOW (ref 12.0–15.0)
Immature Granulocytes: 1 %
Lymphocytes Relative: 13 %
Lymphs Abs: 1.3 10*3/uL (ref 0.7–4.0)
MCH: 27.1 pg (ref 26.0–34.0)
MCHC: 30.8 g/dL (ref 30.0–36.0)
MCV: 87.8 fL (ref 80.0–100.0)
Monocytes Absolute: 0.7 10*3/uL (ref 0.1–1.0)
Monocytes Relative: 7 %
Neutro Abs: 7.8 10*3/uL — ABNORMAL HIGH (ref 1.7–7.7)
Neutrophils Relative %: 76 %
Platelets: 205 10*3/uL (ref 150–400)
RBC: 3.95 MIL/uL (ref 3.87–5.11)
RDW: 13.7 % (ref 11.5–15.5)
WBC: 10.1 10*3/uL (ref 4.0–10.5)
nRBC: 0 % (ref 0.0–0.2)

## 2020-07-03 LAB — GLUCOSE, CAPILLARY
Glucose-Capillary: 52 mg/dL — ABNORMAL LOW (ref 70–99)
Glucose-Capillary: 53 mg/dL — ABNORMAL LOW (ref 70–99)
Glucose-Capillary: 116 mg/dL — ABNORMAL HIGH (ref 70–99)
Glucose-Capillary: 70 mg/dL (ref 70–99)
Glucose-Capillary: 135 mg/dL — ABNORMAL HIGH (ref 70–99)

## 2020-07-03 LAB — IRON AND TIBC
Iron: 12 ug/dL — ABNORMAL LOW (ref 28–170)
Saturation Ratios: 7 % — ABNORMAL LOW (ref 10.4–31.8)
TIBC: 181 ug/dL — ABNORMAL LOW (ref 250–450)
UIBC: 169 ug/dL

## 2020-07-03 LAB — COMPREHENSIVE METABOLIC PANEL
ALT: 31 U/L (ref 0–44)
AST: 25 U/L (ref 15–41)
Albumin: 2.5 g/dL — ABNORMAL LOW (ref 3.5–5.0)
Alkaline Phosphatase: 59 U/L (ref 38–126)
Anion gap: 6 (ref 5–15)
BUN: 15 mg/dL (ref 8–23)
CO2: 25 mmol/L (ref 22–32)
Calcium: 8.4 mg/dL — ABNORMAL LOW (ref 8.9–10.3)
Chloride: 106 mmol/L (ref 98–111)
Creatinine, Ser: 0.66 mg/dL (ref 0.44–1.00)
GFR, Estimated: >60 mL/min (ref >60)
Glucose, Bld: 61 mg/dL — ABNORMAL LOW (ref 70–99)
Potassium: 3.2 mmol/L — ABNORMAL LOW (ref 3.5–5.1)
Sodium: 137 mmol/L (ref 135–145)
Total Bilirubin: 0.6 mg/dL (ref 0.3–1.2)
Total Protein: 5.5 g/dL — ABNORMAL LOW (ref 6.5–8.1)

## 2020-07-03 LAB — FERRITIN: Ferritin: 462 ng/mL — ABNORMAL HIGH (ref 11–307)

## 2020-07-03 LAB — RETICULOCYTES
Immature Retic Fract: 18.3 % — ABNORMAL HIGH (ref 2.3–15.9)
RBC.: 3.88 MIL/uL (ref 3.87–5.11)
Retic Count, Absolute: 40.7 10*3/uL (ref 19.0–186.0)
Retic Ct Pct: 1.1 % (ref 0.4–3.1)

## 2020-07-03 LAB — MAGNESIUM: Magnesium: 2 mg/dL (ref 1.7–2.4)

## 2020-07-03 LAB — VITAMIN B12: Vitamin B-12: 1021 pg/mL — ABNORMAL HIGH (ref 180–914)

## 2020-07-03 LAB — FOLATE: Folate: 7.7 ng/mL (ref >5.9)

## 2020-07-03 LAB — PHOSPHORUS: Phosphorus: 1.5 mg/dL — ABNORMAL LOW (ref 2.5–4.6)

## 2020-07-03 MED ORDER — INSULIN DETEMIR 100 UNIT/ML ~~LOC~~ SOLN
4.0000 [IU] | Freq: Every day | SUBCUTANEOUS | Status: DC
  Administered 2020-07-03: 4 [IU] via SUBCUTANEOUS
  Filled 2020-07-03 (×3): qty 0.04

## 2020-07-03 MED ORDER — ONDANSETRON HCL 4 MG PO TABS: 4.0000 mg | ORAL_TABLET | Freq: Four times a day (QID) | ORAL | 0 refills | Status: AC | PRN

## 2020-07-03 MED ORDER — AMOXICILLIN-POT CLAVULANATE 875-125 MG PO TABS: 1.0000 | ORAL_TABLET | Freq: Two times a day (BID) | ORAL | 0 refills | Status: DC

## 2020-07-03 MED ORDER — OXYCODONE HCL 5 MG PO TABS: 5.0000 mg | ORAL_TABLET | ORAL | 0 refills | Status: DC | PRN

## 2020-07-03 MED ORDER — MAGNESIUM SULFATE 2 GM/50ML IV SOLN
2.0000 g | Freq: Once | INTRAVENOUS | Status: AC
  Administered 2020-07-03: 2 g via INTRAVENOUS
  Filled 2020-07-03: qty 50

## 2020-07-03 MED ORDER — DEXTROSE 50 % IV SOLN: 25.0000 mL | Freq: Once | INTRAVENOUS | Status: DC

## 2020-07-03 MED ORDER — DOCUSATE SODIUM 100 MG PO CAPS: 100.0000 mg | ORAL_CAPSULE | Freq: Two times a day (BID) | ORAL | 0 refills | Status: DC

## 2020-07-03 MED ORDER — MAGNESIUM SULFATE 2 GM/50ML IV SOLN: 2.0000 g | Freq: Once | INTRAVENOUS | Status: DC

## 2020-07-03 MED ORDER — POTASSIUM CHLORIDE 20 MEQ PO PACK
40.0000 meq | PACK | Freq: Two times a day (BID) | ORAL | Status: DC
  Administered 2020-07-03: 40 meq via ORAL
  Filled 2020-07-03: qty 2

## 2020-07-03 MED ORDER — MAGNESIUM SULFATE IN D5W 1-5 GM/100ML-% IV SOLN
1.0000 g | Freq: Once | INTRAVENOUS | Status: AC
  Administered 2020-07-03: 1 g via INTRAVENOUS
  Filled 2020-07-03: qty 100

## 2020-07-03 NOTE — Discharge Summary (Addendum)
Physician Discharge Summary  Sandra Washington:[id number] DOB: 06-01-1940 DOA: 06/29/2020  PCP: Lucianne Lei, MD  Admit date: 06/29/2020 Discharge date: 07/03/2020  Admitted From: Home Disposition: Home  Recommendations for Outpatient Follow-up:  1. Follow up with PCP in 1-2 weeks 2. Follow up with General Surgery within 1-2 weeks 3. Please obtain CMP/CBC, Mag, Phosin one week 4. Please follow up on the following pending results:  Home Health: No Equipment/Devices: None    Discharge Condition: Stable  CODE STATUS: FULL CODE Diet recommendation: Heart Healthy Carb Modified Diet   Brief/Interim Summary: The patient is an 80 year old obese African-American female with a past medical history significant for but not limited to CVA, history of hypertension, diabetes mellitus type 2, CKD stage IIIa, as well as other comorbidities who presented with a chief complaint of nausea vomiting. She reports that her symptoms started last weekend and been admitted and unpredictable. She feels that they are not related to mealtime and she reports that she has had her esophagus stretched in the past and she thought she was having symptoms from that. She reported that she had some moisture and she thought maybe she had pulled poisoning and yesterday her symptoms came on with more intensity and have severe right upper quadrant pain that was sharp in nature and also described as a pressure. She had nausea and vomiting in the. As undigested food and also had diarrhea without blood. She last reported her normal meal was dinner and that her appetite has been intact. In the ED she came in and she had an elevated leukocytosis of 11.2 and chemistry panel unremarkable. CT scan abdomen pelvis did show distended gallbladder with cholelithiasis and mild intrahepatic biliary dilatation and possibly acute cholecystitis. Right upper quadrant ultrasound was recommended and ordered. She did also have a CT of the  L-spine which showed no acute fracture or static subluxation of the lumbar spine but did show severe bilateral L5-S1 neuroforaminal stenosis.  She underwent a right upper quadrant ultrasound which showed findings concerning for acute simple cystitis.  Yesterday evaluated her WBC went from 11.2-19.6.  She was taken for surgical intervention today and was found to have gangrenous cholecystitis.  General surgery recommended 5 days of antibiotics postoperatively and recommended clears today and keeping on Zosyn while inpatient and transitioning to Augmentin at discharge.  General surgery recommended restarting DVT prophylaxis today and continuing as needed pain control.  Patient did fine without PT support and ambulated without issues.  She finally had a bowel movement and improved significantly.  General surgery evaluated and her pain was controlled.  Her abdomen is soft and only tender her incisions are.  General surgery recommended discharging home on Augmentin for completion and patient to follow-up with general surgery within 1 to 2 weeks.  She is stable be discharged home as she is improved.  She was a little hypoglycemic this morning but insulin was adjusted and when she is eating her regular diet can go back on her home regimen.  Discharge Diagnoses:  Principal Problem:   Gangrenous cholecystitis Active Problems:   Acute gangrenous cholecystitis  AcuteGangrenous Cholecystitis  -She presented with nausea, vomiting and abdominal pain as well as diarrhea and had a leukocytosis; leukocytosis worsened and went from 11.2 ->19.6 -> 9.4 and today is 10.1 -CT of the abdomen pelvis done and showed possible cholecystitis given that she had a distal gallbladder with cholelithiasis and mild intrahepatic biliary dilatation -Right upper quadrant ultrasound was ordered anddone and showed "Cholelithiasis and sludge ingallbladder  with gallbladder wall thickening and focal tenderness over the gallbladder. These  are findings raising concern for acute cholecystitis. Increased liver echogenicity, a finding likely indicative of hepatic steatosis. No focal liver lesions evident. Note that sensitivity of ultrasound for detection of focal liver lesions is diminished in this circumstance." -SHe was started on IV Zosynand will continue  -SARS-CoV-2 Testing Negative  -WBC was 11.2 and worsened to 19.6 and now improved to 9.4; She did spike a Temperature of 100.9 early in the hospitalization prior to surgery  -Blood cultures x2 were not obtained prior to admission -U/A was done and showeda clear appearance with greater than 500 glucose, small hemoglobin, 20 ketones, negative leukocytes, negative nitrites and no bacteria seen -Continue normal saline at 75 MLS per hour for now and have discontinued her hydrochlorothiazide -continue with pain control and patient has oxycodone IR 5 mg p.o. every 4has needed for moderate pain and IV morphine 2 mg every 2 as needed severe pain general surgery has added fentanyl 25 to 50 mcg IV every 5 minutes as needed for pain score greater than 4 for 6 doses in the PACU -Patient also received preoperative antibiotics with cefotetan -Continue supportive care and also continue with antiemetics 4 mg p.o./IV every 6 as needed nausea -Appreciate surgery recommendations -She was NPO for surgical intervention on 07/01/20 and she underwent surgical laparoscopic cholecystectomy found to have acute gangrenous cholecystitis -General surgery recommending continuing antibiotics for 5 days postoperatively and continuing IV Zosyn while hospitalized and transitioning to Augmentin at discharge; will be written for 3 more days of Augmentin -Diet was advanced to soft diet patient tolerated this well.  She had a bowel movement and is passing flatus. -VTE prophylaxis with Heparin 5,000 units resumed  -We will continue pain control as above -Surgery is cleared the patient for discharge and she can follow-up  within 1 to 2 weeks -PT/OT to evaluate and treat  but patient is self ambulatory and did not require any assistance  Hypertension -Continue with home amlodipine 5 mg p.o. twice daily, and irbesartan 150 mg p.o. daily -We will hold home hydrochlorothiazide given that she is getting IV fluid hydration with NS at 75 mL/hr and recommend discontinuing this at discharge -Continue to monitor blood pressures per protocol -Last blood pressure reading was117/50 this morning  HLD -ContinueAtorvastatin 20 mg p.o. daily  DiabetesMellitusType II -Continue with Levemir 10 units sq qHSand with moderate NovoLog sliding scale insulin before meals and at bedtime but this was changed to 4 units; can resume home insulin regimen at discharge if she is eating better -Last HbA1c was 6.4 a few months ago -CBG's range from  52-135 -Follow-up with PCP for further blood sugar management  CKD Stage 3a -Patient's BUN/Cr is stable at  15/0.66 -Avoid nephrotoxic medications, contrast dyes, hypotension and renally dose medications -Currently getting normal saline at 75 MLS per hour -Continue to Monitor and Trend Renal Fxn -Repeat CMP in the AM   Hx of CVA -C/w Atorvastatin 20 mg po Daily  Normocytic Anemia -? Anemia of CKD vs. Dilutional Drop as patient has been getting IVF or Post-Surgical drop -Patient's Hgb/Hct went from 12.0/38.5 -> 10.9/35.2 -> 10.7/34.7 and stable -Check Anemia Panel and showed an iron level of 12, U IBC 169, TIBC 181, saturation ratios of 7%, ferritin of 462, folate level 7.7 and vitamin B12 1021 -Continue to Monitor for S/Sx of Bleeding; No overt bleeding noted -Repeat CBC within 1 week and have PCP discuss about iron supplementation  Hyponatremia -Patient's  Na+ went from 135 -> 133 -> 137 -She remains on NS at 75 mL/hr while hospitalized  -Continue to Monitor and Trend -Repeat CMP within 1 week  Hypokalemia, improved  -Patient's K+ this AM was 3.2 -Replete with po KCl  40 mEQ BID x2 -Continue to Monitor and Replete as Necessary -Repeat CMP within 1 week  Hypophosphatemia -Patient's Phos Level was 2.2 and repeat this AM was 1.5 -Replete prior to D/C  -Continue to Monitor and Replete as Necessary -Repeat Phos Level within 1 week  Obesity -Complicates overall prognosis and care -Estimated body mass index is 30.99 kg/m as calculated from the following:   Height as of this encounter: _0  (1.676 m).   Weight as of this encounter: 87.1 kg. -Weight Loss and Dietary Counseling given   Discharge Instructions  Discharge Instructions    Call MD for:  difficulty breathing, headache or visual disturbances   Complete by: As directed    Call MD for:  extreme fatigue   Complete by: As directed    Call MD for:  hives   Complete by: As directed    Call MD for:  persistant dizziness or light-headedness   Complete by: As directed    Call MD for:  persistant nausea and vomiting   Complete by: As directed    Call MD for:  redness, tenderness, or signs of infection (pain, swelling, redness, odor or green/yellow discharge around incision site)   Complete by: As directed    Call MD for:  severe uncontrolled pain   Complete by: As directed    Call MD for:  temperature >100.4   Complete by: As directed    Diet - low sodium heart healthy   Complete by: As directed    Diet Carb Modified   Complete by: As directed    Discharge instructions   Complete by: As directed    You were cared for by a hospitalist during your hospital stay. If you have any questions about your discharge medications or the care you received while you were in the hospital after you are discharged, you can call the unit and ask to speak with the hospitalist on call if the hospitalist that took care of you is not available. Once you are discharged, your primary care physician will handle any further medical issues. Please note that NO REFILLS for any discharge medications will be authorized  once you are discharged, as it is imperative that you return to your primary care physician (or establish a relationship with a primary care physician if you do not have one) for your aftercare needs so that they can reassess your need for medications and monitor your lab values.  Follow up with PCP and General Surgery within 1-2 weeks. Take all medications as prescribed. If symptoms change or worsen please return to the ED for evaluation   Discharge wound care:   Complete by: As directed    Per Surgery Reccs   Increase activity slowly   Complete by: As directed      Allergies as of 07/03/2020   No Known Allergies     Medication List    TAKE these medications   Accu-Chek Guide Me w/Device Kit 1 each by Does not apply route 2 (two) times daily. Use accu chek guide me device to check blood sugar twice daily.DX:E11.65   Accu-Chek Guide test strip Generic drug: glucose blood 1 each by Other route 2 (two) times daily. Use as instructed   Accu-Chek Softclix Lancets  lancets Use bid   amLODipine 5 MG tablet Commonly known as: NORVASC Take 5 mg by mouth 2 (two) times daily.   amoxicillin-clavulanate 875-125 MG tablet Commonly known as: Augmentin Take 1 tablet by mouth every 12 (twelve) hours for 3 days.   atorvastatin 20 MG tablet Commonly known as: LIPITOR Take 1 tablet (20 mg total) by mouth daily.   bisacodyl 5 MG EC tablet Commonly known as: DULCOLAX Take 5 mg by mouth daily.   clotrimazole-betamethasone cream Commonly known as: LOTRISONE Apply 1 application topically 2 (two) times daily.   docusate sodium 100 MG capsule Commonly known as: COLACE Take 1 capsule (100 mg total) by mouth 2 (two) times daily.   insulin NPH Human 100 UNIT/ML injection Commonly known as: NovoLIN N ReliOn 8 U in am and 12 U hs What changed:   how much to take  how to take this  when to take this   insulin regular 100 units/mL injection Commonly known as: NovoLIN R ReliOn INJECT 8  UNITS SUBCUTANEOUSLY WITH BREAKFAST AND LUNCH, AND 16 UNITS WITH DINNER What changed:   how much to take  how to take this  when to take this  additional instructions   niacin 500 MG tablet Commonly known as: SLO-NIACIN Take 500 mg by mouth at bedtime.   omeprazole 40 MG capsule Commonly known as: PRILOSEC Take 40 mg by mouth daily.   ondansetron 4 MG tablet Commonly known as: ZOFRAN Take 1 tablet (4 mg total) by mouth every 6 (six) hours as needed for nausea.   oxyCODONE 5 MG immediate release tablet Commonly known as: Oxy IR/ROXICODONE Take 1-2 tablets (5-10 mg total) by mouth every 4 (four) hours as needed for moderate pain.   valsartan-hydrochlorothiazide 160-12.5 MG tablet Commonly known as: DIOVAN-HCT Take 1 tablet by mouth daily.   Vitamin D3 125 MCG (5000 UT) Caps Take 5,000 Units by mouth.            Discharge Care Instructions  (From admission, onward)         Start     Ordered   07/03/20 0000  Discharge wound care:       Comments: Per Surgery Reccs   07/03/20 1152          Follow-up Information    Virl Cagey, MD Follow up on 07/15/2020.   Specialty: General Surgery Why: post op phone call unless you need or want to be seen in person  Contact information: [address] Dr Linna Hoff [facility] [id number] [phone]              No Known Allergies  Consultations:  General Surgery   Procedures/Studies: CT Abdomen Pelvis W Contrast  Result Date: 06/30/2020 CLINICAL DATA:  Acute abdominal pain EXAM: CT ABDOMEN AND PELVIS WITH CONTRAST TECHNIQUE: Multidetector CT imaging of the abdomen and pelvis was performed using the standard protocol following bolus administration of intravenous contrast. CONTRAST:  151m OMNIPAQUE IOHEXOL 300 MG/ML  SOLN COMPARISON:  04/09/2012 FINDINGS: LOWER CHEST: Normal. HEPATOBILIARY: Distended gallbladder with cholelithiasis. Mild intrahepatic biliary dilatation . no focal liver abnormality. Common bile duct  is normal caliber. PANCREAS: Normal pancreas. No ductal dilatation or peripancreatic fluid collection. SPLEEN: Normal. ADRENALS/URINARY TRACT: The adrenal glands are normal. No hydronephrosis, nephroureterolithiasis or solid renal mass. The urinary bladder is normal for degree of distention STOMACH/BOWEL: No hiatal hernia. There is a diverticulum of the third segment of the duodenum. No small bowel dilatation or inflammation. No focal colonic abnormality. Normal appendix. VASCULAR/LYMPHATIC: There  is calcific atherosclerosis of the abdominal aorta. No lymphadenopathy. REPRODUCTIVE: Multiple calcified uterine fibroids. MUSCULOSKELETAL. Multilevel degenerative disc disease and facet arthrosis. No bony spinal canal stenosis. OTHER: None. IMPRESSION: 1. Distended gallbladder with cholelithiasis and mild intrahepatic biliary dilatation. If there is concern for acute cholecystitis, consider right upper quadrant ultrasound. 2. Fibroid uterus. Aortic atherosclerosis (ICD10-I70.0). Electronically Signed   By: Ulyses Jarred M.D.   On: 06/30/2020 03:21   CT L-SPINE NO CHARGE  Result Date: 06/30/2020 CLINICAL DATA:  Right flank and back pain after fall EXAM: CT LUMBAR SPINE WITHOUT CONTRAST TECHNIQUE: Multidetector CT imaging of the lumbar spine was performed without intravenous contrast administration. Multiplanar CT image reconstructions were also generated. COMPARISON:  None. FINDINGS: Segmentation: 5 lumbar type vertebrae. Alignment: Normal. Vertebrae: No acute fracture or focal pathologic process. Paraspinal and other soft tissues: Calcific aortic atherosclerosis. Uterine fibroids. Disc levels: There is mild spinal canal stenosis at L4-5. There is severe bilateral L5-S1 neural foraminal stenosis. IMPRESSION: 1. No acute fracture or static subluxation of the lumbar spine. 2. Severe bilateral L5-S1 neural foraminal stenosis. Aortic Atherosclerosis (ICD10-I70.0). Electronically Signed   By: Ulyses Jarred M.D.   On:  06/30/2020 02:57   US Abdomen Limited RUQ (LIVER/GB)  Result Date: 06/30/2020 CLINICAL DATA:  Upper abdominal pain EXAM: ULTRASOUND ABDOMEN LIMITED RIGHT UPPER QUADRANT COMPARISON:  CT abdomen and pelvis Jun 30, 2020 FINDINGS: Gallbladder: Within the gallbladder, there are echogenic foci which move and shadow consistent with cholelithiasis. Largest gallstone measures 9 mm in length. There is gallbladder wall thickening. There is also sludge in the gallbladder. No pericholecystic fluid. Patient is focally tender over the gallbladder. Common bile duct: Diameter: 2 mm. No evident intrahepatic or extrahepatic biliary duct dilatation. Liver: No focal lesion identified. Liver echogenicity overall increased. Portal vein is patent on color Doppler imaging with normal direction of blood flow towards the liver. Other: None. IMPRESSION: 1. Cholelithiasis and sludge in gallbladder with gallbladder wall thickening and focal tenderness over the gallbladder. These are findings raising concern for acute cholecystitis. 2. Increased liver echogenicity, a finding likely indicative of hepatic steatosis. No focal liver lesions evident. Note that sensitivity of ultrasound for detection of focal liver lesions is diminished in this circumstance. Electronically Signed   By: Lowella Grip III M.D.   On: 06/30/2020 08:17     Subjective: Seen and examined at bedside and is doing well.  Had some incisional pain but had a bowel movement just now.  No lightheadedness or dizziness.  Felt well and ambulatory.  Pain is fairly well controlled.  No other concerns or complaints at this time and general surgery is deemed the patient stable to be discharged from a surgical perspective.  Electrolytes are being repleted prior to discharge.  She will follow-up with PCP as well as general surgery in outpatient setting in 1 to 2 weeks.  Discharge Exam: Vitals:   07/03/20 0422 07/03/20 1359  BP: (!) 129/52 (!) 117/50  Pulse: 70 87  Resp: 16 18   Temp: 98 F (36.7 C) 99.1 F (37.3 C)  SpO2: 100% 97%   Vitals:   07/02/20 1422 07/02/20 2019 07/03/20 0422 07/03/20 1359  BP: (!) 119/51 (!) 137/51 (!) 129/52 (!) 117/50  Pulse: 79 79 70 87  Resp: _0 Temp: 98.4 F (36.9 C) 98 F (36.7 C) 98 F (36.7 C) 99.1 F (37.3 C)  TempSrc: Oral Oral Oral Oral  SpO2: 96% 96% 100% 97%  Weight:      Height:  General: Pt is alert, awake, not in acute distress Cardiovascular: RRR, S1/S2 +, no rubs, no gallops Respiratory: Slightly diminished bilaterally, no wheezing, no rhonchi Abdominal: Soft, mildly tender, distended secondary habitus, bowel sounds + Extremities: no appreciable edema, no cyanosis  The results of significant diagnostics from this hospitalization (including imaging, microbiology, ancillary and laboratory) are listed below for reference.    Microbiology: Recent Results (from the past 240 hour(s))  SARS CORONAVIRUS 2 (TAT 6-24 HRS) Nasopharyngeal Nasopharyngeal Swab     Status: None   Collection Time: 06/30/20  5:50 AM   Specimen: Nasopharyngeal Swab  Result Value Ref Range Status   SARS Coronavirus 2 NEGATIVE NEGATIVE Final    Comment: (NOTE) SARS-CoV-2 target nucleic acids are NOT DETECTED.  The SARS-CoV-2 RNA is generally detectable in upper and lower respiratory specimens during the acute phase of infection. Negative results do not preclude SARS-CoV-2 infection, do not rule out co-infections with other pathogens, and should not be used as the sole basis for treatment or other patient management decisions. Negative results must be combined with clinical observations, patient history, and epidemiological information. The expected result is Negative.  Fact Sheet for Patients: SugarRoll.be  Fact Sheet for Healthcare Providers: https://www.woods-mathews.com/  This test is not yet approved or cleared by the [place] FDA and  has been authorized for  detection and/or diagnosis of SARS-CoV-2 by FDA under an Emergency Use Authorization (EUA). This EUA will remain  in effect (meaning this test can be used) for the duration of the COVID-19 declaration under Se ction 564(b)(1) of the Act, 21 U.S.C. section 360bbb-3(b)(1), unless the authorization is terminated or revoked sooner.  Performed at [facility], [place] [address], [place], [place] [address]   MRSA PCR Screening     Status: None   Collection Time: 07/01/20  2:57 AM   Specimen: Nasal Mucosa; Nasopharyngeal  Result Value Ref Range Status   MRSA by PCR NEGATIVE NEGATIVE Final    Comment:        The GeneXpert MRSA Assay (FDA approved for NASAL specimens only), is one component of a comprehensive MRSA colonization surveillance program. It is not intended to diagnose MRSA infection nor to guide or monitor treatment for MRSA infections. Performed at [facility], [address] [place], [place] [address]     Labs: BNP (last 3 results) No results for input(s): BNP in the last 8760 hours. Basic Metabolic Panel: Recent Labs  Lab 06/30/20 0014 07/01/20 0611 07/02/20 0536 07/03/20 0506  NA 137 135 133* 137  K 3.6 3.4* 3.8 3.2*  CL 103 101 104 106  CO2 _0 GLUCOSE 306* 226* 179* 61*  BUN _1 CREATININE 0.75 0.84 0.80 0.66  CALCIUM 9.6 8.9 8.1* 8.4*  MG  --  1.7 2.1 2.0  PHOS  --  2.2* 2.4* 1.5*   Liver Function Tests: Recent Labs  Lab 06/30/20 0014 07/01/20 0611 07/02/20 0536 07/03/20 0506  AST 15 13* 26 25  ALT _2 ALKPHOS 70 56 48 59  BILITOT 1.0 1.2 0.8 0.6  PROT 7.2 6.3* 5.5* 5.5*  ALBUMIN 4.1 3.2* 2.5* 2.5*   Recent Labs  Lab 06/30/20 0014  LIPASE 27   No results for input(s): AMMONIA in the last 168 hours. CBC: Recent Labs  Lab 06/30/20 0014 07/01/20 0611 07/02/20 0536 07/03/20 0506  WBC 11.2* 19.6* 9.4 10.1  NEUTROABS  --  16.7* 7.4 7.8*  HGB 12.5 12.0 10.9* 10.7*  HCT 40.4 38.5 35.2* 34.7*  MCV 87.8 87.9  90.3 87.8  PLT 236 220 171 205   Cardiac Enzymes: No results for input(s): CKTOTAL, CKMB, CKMBINDEX, TROPONINI in the last 168 hours. BNP: Invalid input(s): POCBNP CBG: Recent Labs  Lab 07/03/20 0734 07/03/20 0755 07/03/20 0832 07/03/20 0923 07/03/20 1129  GLUCAP 53* 52* 70 116* 135*   D-Dimer No results for input(s): DDIMER in the last 72 hours. Hgb A1c No results for input(s): HGBA1C in the last 72 hours. Lipid Profile No results for input(s): CHOL, HDL, LDLCALC, TRIG, CHOLHDL, LDLDIRECT in the last 72 hours. Thyroid function studies No results for input(s): TSH, T4TOTAL, T3FREE, THYROIDAB in the last 72 hours.  Invalid input(s): FREET3 Anemia work up Recent Labs    07/03/20 0506 07/03/20 0507  VITAMINB12 1,021*  --   FOLATE 7.7  --   FERRITIN 462*  --   TIBC 181*  --   IRON 12*  --   RETICCTPCT  --  1.1   Urinalysis    Component Value Date/Time   COLORURINE COLORLESS (A) 06/30/2020 0340   APPEARANCEUR CLEAR 06/30/2020 0340   LABSPEC 1.023 06/30/2020 0340   PHURINE 7.0 06/30/2020 0340   GLUCOSEU >=500 (A) 06/30/2020 0340   GLUCOSEU NEGATIVE 10/19/2015 0834   HGBUR SMALL (A) 06/30/2020 0340   BILIRUBINUR NEGATIVE 06/30/2020 0340   KETONESUR 20 (A) 06/30/2020 0340   PROTEINUR 30 (A) 06/30/2020 0340   UROBILINOGEN 0.2 10/19/2015 0834   NITRITE NEGATIVE 06/30/2020 0340   LEUKOCYTESUR NEGATIVE 06/30/2020 0340   Sepsis Labs Invalid input(s): PROCALCITONIN,  WBC,  LACTICIDVEN Microbiology Recent Results (from the past 240 hour(s))  SARS CORONAVIRUS 2 (TAT 6-24 HRS) Nasopharyngeal Nasopharyngeal Swab     Status: None   Collection Time: 06/30/20  5:50 AM   Specimen: Nasopharyngeal Swab  Result Value Ref Range Status   SARS Coronavirus 2 NEGATIVE NEGATIVE Final    Comment: (NOTE) SARS-CoV-2 target nucleic acids are NOT DETECTED.  The SARS-CoV-2 RNA is generally detectable in upper and lower respiratory specimens during the acute phase of infection.  Negative results do not preclude SARS-CoV-2 infection, do not rule out co-infections with other pathogens, and should not be used as the sole basis for treatment or other patient management decisions. Negative results must be combined with clinical observations, patient history, and epidemiological information. The expected result is Negative.  Fact Sheet for Patients: SugarRoll.be  Fact Sheet for Healthcare Providers: https://www.woods-mathews.com/  This test is not yet approved or cleared by the [place] FDA and  has been authorized for detection and/or diagnosis of SARS-CoV-2 by FDA under an Emergency Use Authorization (EUA). This EUA will remain  in effect (meaning this test can be used) for the duration of the COVID-19 declaration under Se ction 564(b)(1) of the Act, 21 U.S.C. section 360bbb-3(b)(1), unless the authorization is terminated or revoked sooner.  Performed at [facility], [place] [address], [place], [place] [address]   MRSA PCR Screening     Status: None   Collection Time: 07/01/20  2:57 AM   Specimen: Nasal Mucosa; Nasopharyngeal  Result Value Ref Range Status   MRSA by PCR NEGATIVE NEGATIVE Final    Comment:        The GeneXpert MRSA Assay (FDA approved for NASAL specimens only), is one component of a comprehensive MRSA colonization surveillance program. It is not intended to diagnose MRSA infection nor to guide or monitor treatment for MRSA infections. Performed at [facility], [address],  [place],  [address]    Time coordinating discharge: 35 minutes  SIGNED:  Kerney Elbe, DO [facility] 07/03/2020, 4:26 PM Pager is on [place]  If 7PM-7AM, please contact night-coverage [email or web address]

## 2020-07-03 NOTE — Progress Notes (Signed)
[facility]  Had BM. Eating and pain controlled.  Soft, nondistended, appropriately tender, dermabond on port sites, no signs of infection.  Augmentin for home for gangrenous cholecystitis.   Will call 07/15/2020 for post op check, if needs to be seen in person patient can notify office.  Curlene Labrum, MD [facility] [address] [place], [place] [address] [phone] (office)

## 2020-07-03 NOTE — Discharge Instructions (Signed)
Discharge Laparoscopic Surgery Instructions:  Common Complaints: Right shoulder pain is common after laparoscopic surgery. This is secondary to the gas used in the surgery being trapped under the diaphragm.  Walk to help your body absorb the gas. This will improve in a few days. Pain at the port sites are common, especially the larger port sites. This will improve with time.  Some nausea is common and poor appetite. The main goal is to stay hydrated the first few days after surgery.   Diet/ Activity: Diet as tolerated. You may not have an appetite, but it is important to stay hydrated. Drink 64 ounces of water a day. Your appetite will return with time.  Shower per your regular routine daily.  Do not take hot showers. Take warm showers that are less than 10 minutes. Rest and listen to your body, but do not remain in bed all day.  Walk everyday for at least 15-20 minutes. Deep cough and move around every 1-2 hours in the first few days after surgery.  Do not lift > 10 lbs, perform excessive bending, pushing, pulling, squatting for 1-2 weeks after surgery.  Do not pick at the dermabond glue on your incision sites.  This glue film will remain in place for 1-2 weeks and will start to peel off.  Do not place lotions or balms on your incision unless instructed to specifically by Dr. Staton Markey.   Pain Expectations and Narcotics: -After surgery you will have pain associated with your incisions and this is normal. The pain is muscular and nerve pain, and will get better with time. -You are encouraged and expected to take non narcotic medications like tylenol and ibuprofen (when able) to treat pain as multiple modalities can aid with pain treatment. -Narcotics are only used when pain is severe or there is breakthrough pain. -You are not expected to have a pain score of 0 after surgery, as we cannot prevent pain. A pain score of 3-4 that allows you to be functional, move, walk, and tolerate some activity is  the goal. The pain will continue to improve over the days after surgery and is dependent on your surgery. -Due to [place] law, we are only able to give a certain amount of pain medication to treat post operative pain, and we only give additional narcotics on a patient by patient basis.  -For most laparoscopic surgery, studies have shown that the majority of patients only need 10-15 narcotic pills, and for open surgeries most patients only need 15-20.   -Having appropriate expectations of pain and knowledge of pain management with non narcotics is important as we do not want anyone to become addicted to narcotic pain medication.  -Using ice packs in the first 48 hours and heating pads after 48 hours, wearing an abdominal binder (when recommended), and using over the counter medications are all ways to help with pain management.   -Simple acts like meditation and mindfulness practices after surgery can also help with pain control and research has proven the benefit of these practices.  Medication: Take tylenol and ibuprofen as needed for pain control, alternating every 4-6 hours.  Example:  Tylenol 1000mg @ 6am, 12noon, 6pm, 12midnight (Do not exceed 4000mg of tylenol a day). Ibuprofen 800mg @ 9am, 3pm, 9pm, 3am (Do not exceed 3600mg of ibuprofen a day).  Take Roxicodone for breakthrough pain every 4 hours.  Take Colace for constipation related to narcotic pain medication. If you do not have a bowel movement in 2 days, take Miralax   over the counter.  Drink plenty of water to also prevent constipation.   Contact Information: If you have questions or concerns, please call our office, [phone], Monday- Thursday 8AM-5PM and Friday 8AM-12Noon.  If it is after hours or on the weekend, please call [facility] Main [facility], [phone], [phone], and ask to speak to the surgeon on call for Dr. Constance Haw at [facility].    Minimally Invasive Cholecystectomy, Care After This sheet gives you information about how  to care for yourself after your procedure. Your doctor may also give you more specific instructions. If you have problems or questions, contact your doctor. What can I expect after the procedure? After the procedure, it is common:  To have pain at the areas of surgery. You will be given medicines for pain.  To vomit or feel like you may vomit.  To feel fullness in the belly (bloating) or to have pain in the shoulder. This comes from the gas that was used during the surgery. Follow these instructions at home: Medicines  Take over-the-counter and prescription medicines only as told by your doctor.  If you were prescribed an antibiotic medicine, take it as told by your doctor. Do not stop using the antibiotic even if you start to feel better.  Ask your doctor if the medicine prescribed to you: ? Requires you to avoid driving or using machinery. ? Can cause trouble pooping (constipation). You may need to take these actions to prevent or treat trouble pooping:  Drink enough fluid to keep your pee (urine) pale yellow.  Take over-the-counter or prescription medicines.  Eat foods that are high in fiber. These include beans, whole grains, and fresh fruits and vegetables.  Limit foods that are high in fat and sugar. These include fried or sweet foods.  Do not take baths, swim, or use a hot tub until your doctor approves. You may shower.   Check your surgery area every day for signs of infection. Check for: ? More redness, swelling, or pain. ? Fluid or blood. ? Warmth. ? Pus or a bad smell.   Activity  Rest as told by your doctor.  Do not sit for a long time without moving. Get up to take short walks every 1-2 hours. This is important. Ask for help if you feel weak or unsteady.  Do not lift anything that is heavier than 10 lb (4.5 kg), or the limit that you are told, until your doctor says that it is safe.  Do not play contact sports until your doctor says it is okay.  Do not return  to work or school until your doctor says it is okay.  Return to your normal activities as told by your doctor. Ask your doctor what activities are safe for you. General instructions  If you were given a medicine to help you relax (sedative) during your procedure, it can affect you for many hours. Do not drive or use machinery until your doctor says that it is safe.  Keep all follow-up visits as told by your doctor. This is important. Contact a doctor if:  You get a rash.  You have more redness, swelling, or pain around your cuts from surgery.  You have fluid or blood coming from your cuts from surgery.  Your cuts from surgery feel warm to the touch.  You have pus or a bad smell coming from your cuts from surgery.  You have a fever.  One or more of your cuts from surgery breaks open. Get help  right away if:  You have trouble breathing.  You have chest pain.  You have pain that is getting worse in your shoulders.  You faint or feel dizzy when you stand.  You have very bad pain in your belly (abdomen).  You feel like you may vomit or you vomit, and this lasts for more than one day.  You have leg pain. Summary  After your surgery, it is common to have pain at the areas of surgery. You may also have vomiting or fullness in the belly.  Follow your doctor's instructions about medicine, activity restrictions, and caring for your surgery areas. Do not do activities that require a lot of effort.  Contact a doctor if you have a fever or other signs of infection, such as more redness, swelling, or pain around the cuts from surgery.  Get help right away if you have chest pain, increasing pain in the shoulders, or trouble breathing. This information is not intended to replace advice given to you by your health care provider. Make sure you discuss any questions you have with your health care provider. Document Revised: 01/28/2019 Document Reviewed: 01/28/2019 Elsevier Patient  Education  2021 [employer].

## 2020-07-05 ENCOUNTER — Encounter (HOSPITAL_COMMUNITY): Payer: Self-pay | Admitting: General Surgery

## 2020-07-05 ENCOUNTER — Encounter: Payer: Self-pay | Admitting: Family Medicine

## 2020-07-06 ENCOUNTER — Inpatient Hospital Stay (HOSPITAL_COMMUNITY)
Admission: EM | Admit: 2020-07-06 | Discharge: 2020-07-15 | DRG: 863 | Disposition: A | Discharge status: Home / Self Care | Payer: Medicare HMO | Attending: Internal Medicine | Admitting: Internal Medicine

## 2020-07-06 ENCOUNTER — Encounter (HOSPITAL_COMMUNITY): Payer: Self-pay | Admitting: Emergency Medicine

## 2020-07-06 ENCOUNTER — Other Ambulatory Visit: Payer: Self-pay

## 2020-07-06 ENCOUNTER — Emergency Department (HOSPITAL_COMMUNITY): Payer: Medicare HMO

## 2020-07-06 DIAGNOSIS — T8189XA Other complications of procedures, not elsewhere classified, initial encounter: Secondary | ICD-10-CM | POA: Diagnosis not present

## 2020-07-06 DIAGNOSIS — Z9049 Acquired absence of other specified parts of digestive tract: Secondary | ICD-10-CM | POA: Diagnosis not present

## 2020-07-06 DIAGNOSIS — E119 Type 2 diabetes mellitus without complications: Secondary | ICD-10-CM | POA: Diagnosis not present

## 2020-07-06 DIAGNOSIS — K219 Gastro-esophageal reflux disease without esophagitis: Secondary | ICD-10-CM | POA: Diagnosis present

## 2020-07-06 DIAGNOSIS — R109 Unspecified abdominal pain: Secondary | ICD-10-CM | POA: Diagnosis not present

## 2020-07-06 DIAGNOSIS — R7881 Bacteremia: Secondary | ICD-10-CM | POA: Diagnosis not present

## 2020-07-06 DIAGNOSIS — Z79899 Other long term (current) drug therapy: Secondary | ICD-10-CM

## 2020-07-06 DIAGNOSIS — R1011 Right upper quadrant pain: Secondary | ICD-10-CM | POA: Diagnosis not present

## 2020-07-06 DIAGNOSIS — E1165 Type 2 diabetes mellitus with hyperglycemia: Secondary | ICD-10-CM | POA: Diagnosis present

## 2020-07-06 DIAGNOSIS — K651 Peritoneal abscess: Secondary | ICD-10-CM | POA: Diagnosis present

## 2020-07-06 DIAGNOSIS — T8143XA Infection following a procedure, organ and space surgical site, initial encounter: Principal | ICD-10-CM | POA: Diagnosis present

## 2020-07-06 DIAGNOSIS — T8149XA Infection following a procedure, other surgical site, initial encounter: Secondary | ICD-10-CM | POA: Diagnosis not present

## 2020-07-06 DIAGNOSIS — Z978 Presence of other specified devices: Secondary | ICD-10-CM | POA: Diagnosis not present

## 2020-07-06 DIAGNOSIS — E1169 Type 2 diabetes mellitus with other specified complication: Secondary | ICD-10-CM

## 2020-07-06 DIAGNOSIS — K810 Acute cholecystitis: Secondary | ICD-10-CM

## 2020-07-06 DIAGNOSIS — I10 Essential (primary) hypertension: Secondary | ICD-10-CM | POA: Diagnosis not present

## 2020-07-06 DIAGNOSIS — E663 Overweight: Secondary | ICD-10-CM | POA: Diagnosis present

## 2020-07-06 DIAGNOSIS — Z713 Dietary counseling and surveillance: Secondary | ICD-10-CM | POA: Diagnosis not present

## 2020-07-06 DIAGNOSIS — E11319 Type 2 diabetes mellitus with unspecified diabetic retinopathy without macular edema: Secondary | ICD-10-CM | POA: Diagnosis not present

## 2020-07-06 DIAGNOSIS — K750 Abscess of liver: Secondary | ICD-10-CM | POA: Diagnosis not present

## 2020-07-06 DIAGNOSIS — E785 Hyperlipidemia, unspecified: Secondary | ICD-10-CM | POA: Diagnosis not present

## 2020-07-06 DIAGNOSIS — A4189 Other specified sepsis: Secondary | ICD-10-CM | POA: Diagnosis not present

## 2020-07-06 DIAGNOSIS — K838 Other specified diseases of biliary tract: Secondary | ICD-10-CM | POA: Diagnosis not present

## 2020-07-06 DIAGNOSIS — Z87891 Personal history of nicotine dependence: Secondary | ICD-10-CM

## 2020-07-06 DIAGNOSIS — Z794 Long term (current) use of insulin: Secondary | ICD-10-CM | POA: Diagnosis not present

## 2020-07-06 DIAGNOSIS — Z20822 Contact with and (suspected) exposure to covid-19: Secondary | ICD-10-CM | POA: Diagnosis present

## 2020-07-06 DIAGNOSIS — Y838 Other surgical procedures as the cause of abnormal reaction of the patient, or of later complication, without mention of misadventure at the time of the procedure: Secondary | ICD-10-CM | POA: Diagnosis present

## 2020-07-06 DIAGNOSIS — K6811 Postprocedural retroperitoneal abscess: Secondary | ICD-10-CM | POA: Diagnosis not present

## 2020-07-06 DIAGNOSIS — T8141XA Infection following a procedure, superficial incisional surgical site, initial encounter: Secondary | ICD-10-CM | POA: Diagnosis not present

## 2020-07-06 DIAGNOSIS — F064 Anxiety disorder due to known physiological condition: Secondary | ICD-10-CM | POA: Diagnosis not present

## 2020-07-06 DIAGNOSIS — Z6829 Body mass index (BMI) 29.0-29.9, adult: Secondary | ICD-10-CM | POA: Diagnosis not present

## 2020-07-06 DIAGNOSIS — Z8249 Family history of ischemic heart disease and other diseases of the circulatory system: Secondary | ICD-10-CM | POA: Diagnosis not present

## 2020-07-06 DIAGNOSIS — E782 Mixed hyperlipidemia: Secondary | ICD-10-CM | POA: Diagnosis not present

## 2020-07-06 DIAGNOSIS — R531 Weakness: Secondary | ICD-10-CM | POA: Diagnosis not present

## 2020-07-06 DIAGNOSIS — Z8673 Personal history of transient ischemic attack (TIA), and cerebral infarction without residual deficits: Secondary | ICD-10-CM

## 2020-07-06 LAB — COMPREHENSIVE METABOLIC PANEL
ALT: 23 U/L (ref 0–44)
AST: 16 U/L (ref 15–41)
Albumin: 2.9 g/dL — ABNORMAL LOW (ref 3.5–5.0)
Alkaline Phosphatase: 81 U/L (ref 38–126)
Anion gap: 10 (ref 5–15)
BUN: 7 mg/dL — ABNORMAL LOW (ref 8–23)
CO2: 26 mmol/L (ref 22–32)
Calcium: 9.1 mg/dL (ref 8.9–10.3)
Chloride: 97 mmol/L — ABNORMAL LOW (ref 98–111)
Creatinine, Ser: 0.92 mg/dL (ref 0.44–1.00)
GFR, Estimated: >60 mL/min (ref >60)
Glucose, Bld: 144 mg/dL — ABNORMAL HIGH (ref 70–99)
Potassium: 3.2 mmol/L — ABNORMAL LOW (ref 3.5–5.1)
Sodium: 133 mmol/L — ABNORMAL LOW (ref 135–145)
Total Bilirubin: 0.9 mg/dL (ref 0.3–1.2)
Total Protein: 6.5 g/dL (ref 6.5–8.1)

## 2020-07-06 LAB — CBC WITH DIFFERENTIAL/PLATELET
Abs Immature Granulocytes: 0.65 10*3/uL — ABNORMAL HIGH (ref 0.00–0.07)
Basophils Absolute: 0.1 10*3/uL (ref 0.0–0.1)
Basophils Relative: 1 %
Eosinophils Absolute: 0.1 10*3/uL (ref 0.0–0.5)
Eosinophils Relative: 1 %
HCT: 39.1 % (ref 36.0–46.0)
Hemoglobin: 12.3 g/dL (ref 12.0–15.0)
Immature Granulocytes: 6 %
Lymphocytes Relative: 21 %
Lymphs Abs: 2.4 10*3/uL (ref 0.7–4.0)
MCH: 27 pg (ref 26.0–34.0)
MCHC: 31.5 g/dL (ref 30.0–36.0)
MCV: 85.9 fL (ref 80.0–100.0)
Monocytes Absolute: 1.2 10*3/uL — ABNORMAL HIGH (ref 0.1–1.0)
Monocytes Relative: 10 %
Neutro Abs: 7.1 10*3/uL (ref 1.7–7.7)
Neutrophils Relative %: 61 %
Platelets: 381 10*3/uL (ref 150–400)
RBC: 4.55 MIL/uL (ref 3.87–5.11)
RDW: 13.7 % (ref 11.5–15.5)
WBC: 11.6 10*3/uL — ABNORMAL HIGH (ref 4.0–10.5)
nRBC: 0 % (ref 0.0–0.2)

## 2020-07-06 LAB — LIPASE, BLOOD: Lipase: 43 U/L (ref 11–51)

## 2020-07-06 MED ORDER — MORPHINE SULFATE (PF) 2 MG/ML IV SOLN
2.0000 mg | INTRAVENOUS | Status: DC | PRN
  Administered 2020-07-07: 2 mg via INTRAVENOUS
  Filled 2020-07-06: qty 1

## 2020-07-06 MED ORDER — IOHEXOL 300 MG/ML  SOLN
75.0000 mL | Freq: Once | INTRAMUSCULAR | Status: AC | PRN
  Administered 2020-07-06: 75 mL via INTRAVENOUS

## 2020-07-06 MED ORDER — INSULIN ASPART 100 UNIT/ML IJ SOLN
0.0000 [IU] | Freq: Three times a day (TID) | INTRAMUSCULAR | Status: DC
  Administered 2020-07-08 (×2): 3 [IU] via SUBCUTANEOUS
  Administered 2020-07-09: 1 [IU] via SUBCUTANEOUS
  Administered 2020-07-09: 5 [IU] via SUBCUTANEOUS
  Administered 2020-07-09: 1 [IU] via SUBCUTANEOUS
  Administered 2020-07-10 – 2020-07-11 (×4): 2 [IU] via SUBCUTANEOUS
  Administered 2020-07-12: 3 [IU] via SUBCUTANEOUS
  Administered 2020-07-13: 1 [IU] via SUBCUTANEOUS
  Administered 2020-07-13 – 2020-07-14 (×2): 2 [IU] via SUBCUTANEOUS
  Administered 2020-07-14: 1 [IU] via SUBCUTANEOUS
  Administered 2020-07-14: 2 [IU] via SUBCUTANEOUS
  Administered 2020-07-15: 3 [IU] via SUBCUTANEOUS
  Administered 2020-07-15: 2 [IU] via SUBCUTANEOUS

## 2020-07-06 MED ORDER — POTASSIUM CHLORIDE 2 MEQ/ML IV SOLN
INTRAVENOUS | Status: AC
  Filled 2020-07-06 (×2): qty 1000

## 2020-07-06 MED ORDER — SENNOSIDES-DOCUSATE SODIUM 8.6-50 MG PO TABS
2.0000 | ORAL_TABLET | Freq: Every day | ORAL | Status: DC
  Administered 2020-07-07 – 2020-07-14 (×2): 2 via ORAL
  Filled 2020-07-06 (×7): qty 2

## 2020-07-06 MED ORDER — PANTOPRAZOLE SODIUM 40 MG PO TBEC
40.0000 mg | DELAYED_RELEASE_TABLET | Freq: Every day | ORAL | Status: DC
  Administered 2020-07-08 – 2020-07-15 (×8): 40 mg via ORAL
  Filled 2020-07-06: qty 2
  Filled 2020-07-06 (×8): qty 1

## 2020-07-06 MED ORDER — FENTANYL CITRATE (PF) 100 MCG/2ML IJ SOLN
50.0000 ug | Freq: Once | INTRAMUSCULAR | Status: AC
  Administered 2020-07-06: 50 ug via INTRAVENOUS
  Filled 2020-07-06: qty 2

## 2020-07-06 MED ORDER — ACETAMINOPHEN 325 MG PO TABS
650.0000 mg | ORAL_TABLET | Freq: Four times a day (QID) | ORAL | Status: DC | PRN
  Administered 2020-07-11: 650 mg via ORAL
  Filled 2020-07-06: qty 2

## 2020-07-06 MED ORDER — AMLODIPINE BESYLATE 5 MG PO TABS
5.0000 mg | ORAL_TABLET | Freq: Every day | ORAL | Status: DC
  Administered 2020-07-08 – 2020-07-15 (×8): 5 mg via ORAL
  Filled 2020-07-06 (×9): qty 1

## 2020-07-06 MED ORDER — ATORVASTATIN CALCIUM 10 MG PO TABS
20.0000 mg | ORAL_TABLET | Freq: Every day | ORAL | Status: DC
  Administered 2020-07-08 – 2020-07-15 (×8): 20 mg via ORAL
  Filled 2020-07-06 (×9): qty 2

## 2020-07-06 MED ORDER — SODIUM CHLORIDE 0.9 % IV BOLUS
1000.0000 mL | Freq: Once | INTRAVENOUS | Status: AC
  Administered 2020-07-06: 1000 mL via INTRAVENOUS

## 2020-07-06 MED ORDER — PIPERACILLIN-TAZOBACTAM 3.375 G IVPB 30 MIN
3.3750 g | Freq: Once | INTRAVENOUS | Status: AC
  Administered 2020-07-06: 3.375 g via INTRAVENOUS
  Filled 2020-07-06: qty 50

## 2020-07-06 MED ORDER — OXYCODONE HCL 5 MG PO TABS
5.0000 mg | ORAL_TABLET | ORAL | Status: DC | PRN
  Administered 2020-07-07 – 2020-07-11 (×9): 5 mg via ORAL
  Filled 2020-07-06 (×10): qty 1

## 2020-07-06 MED ORDER — ONDANSETRON HCL 4 MG/2ML IJ SOLN
4.0000 mg | INTRAMUSCULAR | Status: AC
  Administered 2020-07-06: 4 mg via INTRAVENOUS
  Filled 2020-07-06: qty 2

## 2020-07-06 MED ORDER — ENOXAPARIN SODIUM 40 MG/0.4ML IJ SOSY
40.0000 mg | PREFILLED_SYRINGE | Freq: Every day | INTRAMUSCULAR | Status: DC
  Administered 2020-07-06: 40 mg via SUBCUTANEOUS
  Filled 2020-07-06: qty 0.4

## 2020-07-06 NOTE — ED Notes (Signed)
Per lab, urine sample was not enough to complete urinalysis, will need repeat.

## 2020-07-06 NOTE — ED Triage Notes (Signed)
Pt sent by her PCP for further evaluation, pt had gallbladder removed last week, pt has continued abdominal pain, nausea, vomiting and weakness.

## 2020-07-06 NOTE — ED Notes (Signed)
Patient placed on pure wick °

## 2020-07-06 NOTE — ED Provider Notes (Signed)
Emergency Medicine Provider Triage Evaluation Note  Sandra Washington , a 80 y.o. female  was evaluated in triage.  Pt complains of RUQ abdominal pain, nausea, vomiting, diarrhea, generalized malaise. Had cholecystectomy last week. Seen by Dr Criss Rosales who told her to come to ER.  Review of Systems  Positive: Chills, nausea, vomiting, diarrhea, abdominal pain  Negative: CP cough   Physical Exam  BP 115/71 (BP Location: Right Arm)   Pulse 89   Temp 99.4 F (37.4 C) (Oral)   Resp 14   SpO2 100%  Gen:   Awake, no distress   Resp:  Normal effort  MSK:   Moves extremities without difficulty  Abd:  RUQ abdominal tenderness  Medical Decision Making  Medically screening exam initiated at 6:03 PM.  Appropriate orders placed.  Sandra Washington was informed that the remainder of the evaluation will be completed by another provider, this initial triage assessment does not replace that evaluation, and the importance of remaining in the ED until their evaluation is complete.    Sandra Feil, PA-C 07/06/20 1804    Sandra Chapel, MD 07/06/20 2101

## 2020-07-06 NOTE — ED Provider Notes (Signed)
[facility] EMERGENCY DEPARTMENT Provider Note   CSN: [id number] Arrival date & time: 07/06/20  1748     History Chief Complaint  Patient presents with  . Post-op Problem    Sandra Washington is a 80 y.o. female.  HPI   This patient is an 80 year old female, she has diabetes, hypertension has had a prior stroke and according to the medical record the patient was admitted to the hospital on May 3 during which time she underwent a laparoscopic cholecystectomy by Dr. Constance Haw, she was released from the hospital a couple of days later and initially did well.  Unfortunately over the last couple of days she has developed a feeling of subjective fevers and chills, she has developed a feeling of nausea and vomiting and increasing abdominal pain in the right side of her abdomen as well as the right upper quadrant.  She denies abnormal bowel movements until just prior to arrival when she had a loose stool.  No swelling in the legs, no headache, no cough, no shortness of breath, the symptoms are gradually worsening persistent and prompted her visit to the emergency department when they became more severe this evening.   Past Medical History:  Diagnosis Date  . Chronic kidney disease    kidney stone  . Diabetes mellitus without complication ([place])   . Hypertension   . Stroke Sandra Washington)     Patient Active Problem List   Diagnosis Date Noted  . Acute gangrenous cholecystitis 07/01/2020  . Gangrenous cholecystitis 06/30/2020  . Background diabetic retinopathy ([place]) 01/15/2020  . Hypertension, essential 10/30/2014  . Hypercholesterolemia 11/06/2012  . Unspecified essential hypertension 11/06/2012  . Type II or unspecified type diabetes mellitus without mention of complication, uncontrolled 10/23/2012    Past Surgical History:  Procedure Laterality Date  . BREAST BIOPSY Left   . CATARACT EXTRACTION Bilateral 2018   Dr. Tommy Washington  . CHOLECYSTECTOMY N/A 07/01/2020   Procedure:  LAPAROSCOPIC CHOLECYSTECTOMY;  Surgeon: Sandra Cagey, MD;  Location: [facility];  Service: General;  Laterality: N/A;  . HAND SURGERY    . NO PAST SURGERIES       OB History   No obstetric history on file.     No family history on file.  Social History   Tobacco Use  . Smoking status: Former Research scientist (life sciences)  . Smokeless tobacco: Never Used  Substance Use Topics  . Alcohol use: No  . Drug use: No    Home Medications Prior to Admission medications   Medication Sig Start Date End Date Taking? Authorizing Provider  ACCU-CHEK SOFTCLIX LANCETS lancets Use bid 05/01/17   Elayne Snare, MD  amLODipine (NORVASC) 5 MG tablet Take 5 mg by mouth 2 (two) times daily. 10/19/14   [provider]  amoxicillin-clavulanate (AUGMENTIN) 875-125 MG tablet Take 1 tablet by mouth every 12 (twelve) hours for 3 days. 07/03/20 07/06/20  Raiford Noble Latif, DO  atorvastatin (LIPITOR) 20 MG tablet Take 1 tablet (20 mg total) by mouth daily. 12/17/18   Elayne Snare, MD  bisacodyl (DULCOLAX) 5 MG EC tablet Take 5 mg by mouth daily.    [provider]  Blood Glucose Monitoring Suppl (ACCU-CHEK GUIDE ME) w/Device KIT 1 each by Does not apply route 2 (two) times daily. Use accu chek guide me device to check blood sugar twice daily.DX:E11.65 12/23/18   Elayne Snare, MD  Cholecalciferol (VITAMIN D3) 5000 UNITS CAPS Take 5,000 Units by mouth.    [provider]  clotrimazole-betamethasone (LOTRISONE) cream Apply  1 application topically 2 (two) times daily.    [provider]  docusate sodium (COLACE) 100 MG capsule Take 1 capsule (100 mg total) by mouth 2 (two) times daily. 07/03/20   Raiford Noble Latif, DO  glucose blood (ACCU-CHEK GUIDE) test strip 1 each by Other route 2 (two) times daily. Use as instructed 12/17/18   Elayne Snare, MD  insulin NPH Human (NOVOLIN N RELION) 100 UNIT/ML injection 8 U in am and 12 U hs Patient taking differently: Inject 8-12 Units into the skin in the morning and at  bedtime. 8 U in am and 12 U hs 11/06/19   Elayne Snare, MD  insulin regular (NOVOLIN R RELION) 100 units/mL injection INJECT 8 UNITS SUBCUTANEOUSLY WITH BREAKFAST AND LUNCH, AND 16 UNITS WITH DINNER Patient taking differently: Inject 8-16 Units into the skin 3 (three) times daily before meals. 8u with breakfast and lunch, and 16u with dinner 11/06/19   Elayne Snare, MD  niacin (SLO-NIACIN) 500 MG tablet Take 500 mg by mouth at bedtime.    [provider]  omeprazole (PRILOSEC) 40 MG capsule Take 40 mg by mouth daily.    [provider]  ondansetron (ZOFRAN) 4 MG tablet Take 1 tablet (4 mg total) by mouth every 6 (six) hours as needed for nausea. 07/03/20   Raiford Noble Latif, DO  oxyCODONE (OXY IR/ROXICODONE) 5 MG immediate release tablet Take 1-2 tablets (5-10 mg total) by mouth every 4 (four) hours as needed for moderate pain. 07/03/20   Sandra Washington, Omair Latif, DO  valsartan-hydrochlorothiazide (DIOVAN-HCT) 160-12.5 MG tablet Take 1 tablet by mouth daily.    [provider]    Allergies    Patient has no known allergies.  Review of Systems   Review of Systems  All other systems reviewed and are negative.   Physical Exam Updated Vital Signs BP (!) 134/55   Pulse 91   Temp 99.4 F (37.4 C) (Oral)   Resp 16   SpO2 100%   Physical Exam Vitals and nursing note reviewed.  Constitutional:      General: She is not in acute distress.    Appearance: She is well-developed.  HENT:     Head: Normocephalic and atraumatic.     Mouth/Throat:     Mouth: Mucous membranes are dry.     Pharynx: No oropharyngeal exudate.  Eyes:     General: No scleral icterus.       Right eye: No discharge.        Left eye: No discharge.     Conjunctiva/sclera: Conjunctivae normal.     Pupils: Pupils are equal, round, and reactive to light.  Neck:     Thyroid: No thyromegaly.     Vascular: No JVD.  Cardiovascular:     Rate and Rhythm: Normal rate and regular rhythm.     Heart sounds:  Normal heart sounds. No murmur heard. No friction rub. No gallop.   Pulmonary:     Effort: Pulmonary effort is normal. No respiratory distress.     Breath sounds: Normal breath sounds. No wheezing or rales.  Abdominal:     General: Bowel sounds are normal. There is no distension.     Palpations: Abdomen is soft. There is no mass.     Tenderness: There is abdominal tenderness.     Comments: There is tenderness to palpation in the right upper quadrant of the right mid abdomen, there is no tenderness on the left side, there is no peritoneal signs but there  are mild guarding findings in the right side.  Musculoskeletal:        General: No tenderness. Normal range of motion.     Cervical back: Normal range of motion and neck supple.  Lymphadenopathy:     Cervical: No cervical adenopathy.  Skin:    General: Skin is warm and dry.     Findings: No erythema or rash.  Neurological:     Mental Status: She is alert.     Coordination: Coordination normal.  Psychiatric:        Behavior: Behavior normal.     ED Results / Procedures / Treatments   Labs (all labs ordered are listed, but only abnormal results are displayed) Labs Reviewed  CBC WITH DIFFERENTIAL/PLATELET - Abnormal; Notable for the following components:      Result Value   WBC 11.6 (*)    Monocytes Absolute 1.2 (*)    Abs Immature Granulocytes 0.65 (*)    All other components within normal limits  COMPREHENSIVE METABOLIC PANEL - Abnormal; Notable for the following components:   Sodium 133 (*)    Potassium 3.2 (*)    Chloride 97 (*)    Glucose, Bld 144 (*)    BUN 7 (*)    Albumin 2.9 (*)    All other components within normal limits  LIPASE, BLOOD    EKG None  Radiology CT ABDOMEN PELVIS W CONTRAST  Result Date: 07/06/2020 CLINICAL DATA:  Nonlocalized acute abdominal pain. EXAM: CT ABDOMEN AND PELVIS WITH CONTRAST TECHNIQUE: Multidetector CT imaging of the abdomen and pelvis was performed using the standard protocol  following bolus administration of intravenous contrast. CONTRAST:  29m OMNIPAQUE IOHEXOL 300 MG/ML  SOLN COMPARISON:  CT abdomen pelvis 06/30/2020 FINDINGS: Lower chest: Trace right pleural effusion. Right lower lobe passive atelectasis. Coronary artery calcifications. Hepatobiliary: No focal liver abnormality is seen. Status post cholecystectomy with interval development of a 7.6 x 6.7 x 7.2 cm gallbladder fossa fluid and gas collection (5:34). Slight peripheral enhancement noted. No biliary dilatation. Pancreas: No focal lesion. Normal pancreatic contour. No surrounding inflammatory changes. No main pancreatic ductal dilatation. Spleen: Normal in size without focal abnormality. Adrenals/Urinary Tract: No adrenal nodule bilaterally. Bilateral kidneys enhance symmetrically. No hydronephrosis. No hydroureter. The urinary bladder is unremarkable. Stomach/Bowel: Stomach is within normal limits. No evidence of bowel wall thickening or dilatation. Redemonstration of a third portion of the duodenum diverticulum (3:35). Fluid density within the lumen of the large bowel. Appendix appears normal. Vascular/Lymphatic: No abdominal aorta or iliac aneurysm. Mild-to-moderate atherosclerotic plaque of the aorta and its branches. No abdominal, pelvic, or inguinal lymphadenopathy. Reproductive: Multiple coarsely calcified lesions within the uterus likely represents degenerative uterine fibroids. Other: Trace peripancreatic free fluid. No intraperitoneal free gas. Musculoskeletal: No abdominal wall hernia or abnormality. No suspicious lytic or blastic osseous lesions. No acute displaced fracture. Multilevel degenerative changes of the spine. IMPRESSION: 1. Status post cholecystectomy with interval development of a 7.6 x 6.7 cm gallbladder fossa abscess . 2. Trace right pleural effusion. 3. Multiple degenerative uterine fibroids. 4. Fast-incision state. Electronically Signed   By: MIven FinnM.D.   On: 07/06/2020 22:18     Procedures Procedures   Medications Ordered in ED Medications  piperacillin-tazobactam (ZOSYN) IVPB 3.375 g (has no administration in time range)  ondansetron (ZOFRAN) injection 4 mg (4 mg Intravenous Given 07/06/20 2133)  sodium chloride 0.9 % bolus 1,000 mL (0 mLs Intravenous Stopped 07/06/20 2233)  fentaNYL (SUBLIMAZE) injection 50 mcg (50 mcg Intravenous Given 07/06/20 2134)  iohexol (OMNIPAQUE) 300 MG/ML solution 75 mL (75 mLs Intravenous Contrast Given 07/06/20 2202)    ED Course  I have reviewed the triage vital signs and the nursing notes.  Pertinent labs & imaging results that were available during my care of the patient were reviewed by me and considered in my medical decision making (see chart for details).    MDM Rules/Calculators/A&P                          Postoperative pain status postcholecystectomy with subjective fevers and chills.  White count is 11,600, abdominal pain is suggestive of a possible postoperative complication such as infection though this could just be normal leukocytosis resolving.  We will obtain a CT scan of the abdomen and pelvis to make sure there is no signs of postop abscess, ileus, obstruction, adhesions or other postoperative complication.  The patient's vital signs are reflective of a normal blood pressure at 112/51.  She is agreeable to fluids and antiemetics.  I discussed the care with the general surgeon Dr. Amado Coe who is agreeable to see the patient in consultation, I also discussed the care with the hospitalist Dr. Nevada Crane who has been kind enough to admit.  The patient will likely need eventual radiology drainage because of the CT scan confirmed postoperative abscess in the surgical bed.  No tachycardia, no hypotension, no fever, this patient is otherwise stable for admission  Final Clinical Impression(s) / ED Diagnoses Final diagnoses:  Post-operative wound abscess      Sandra Chapel, MD 07/06/20 2253

## 2020-07-06 NOTE — H&P (Addendum)
History and Physical  [name] KPT:[id number] DOB: Sep 18, 1940 DOA: 07/06/2020  Referring physician: Dr. Sabra Heck, [place]. PCP: Lucianne Lei, MD  Outpatient Specialists: None. Patient coming from: Home.  Chief Complaint: Right upper quadrant abdominal pain, nausea and vomiting.  HPI: Sandra Washington is a 80 y.o. female with medical history significant for essential hypertension, type 2 diabetes, hyperlipidemia, recent admission for acute gangrenous cholecystitis status postcholecystectomy on 07/01/2020 by Dr. Constance Haw who presented to [facility] ED due to severe right upper quadrant abdominal pain associated with intermittent nausea, vomiting and diarrhea x3 days.  Admits to chills.  She presented to the ED for further evaluation.  CT abdomen and pelvis with contrast revealed interval development of a 7.6 x 6.7 cm gallbladder fossa abscess.  Was started on broad-spectrum IV antibiotics empirically in the ED.  EDP contacted general surgery.  TRH, hospitalist team, was asked to admit.    ED Course: T-max 99.4.  BP 111/52, pulse 84, respiration rate 19, O2 saturation 94% on room air.  Lab studies remarkable for serum sodium 133, potassium 3.2, BUN 7, creatinine 0.92, glucose 144, LFTs normal.  WBC 11.6, hemoglobin 12.3, platelet count 381.  Review of Systems: Review of systems as noted in the HPI. All other systems reviewed and are negative.   Past Medical History:  Diagnosis Date  . Chronic kidney disease    kidney stone  . Diabetes mellitus without complication ([place])   . Hypertension   . Stroke [facility])    Past Surgical History:  Procedure Laterality Date  . BREAST BIOPSY Left   . CATARACT EXTRACTION Bilateral 2018   Dr. Tommy Rainwater  . CHOLECYSTECTOMY N/A 07/01/2020   Procedure: LAPAROSCOPIC CHOLECYSTECTOMY;  Surgeon: Virl Cagey, MD;  Location: [facility] ORS;  Service: General;  Laterality: N/A;  . HAND SURGERY    . NO PAST SURGERIES      Social History:  reports that she has quit  smoking. She has never used smokeless tobacco. She reports that she does not drink alcohol and does not use drugs.   No Known Allergies  Family history: Mother with history of hypertension.  Prior to Admission medications   Medication Sig Start Date End Date Taking? Authorizing Provider  ACCU-CHEK SOFTCLIX LANCETS lancets Use bid 05/01/17   Elayne Snare, MD  amLODipine (NORVASC) 5 MG tablet Take 5 mg by mouth 2 (two) times daily. 10/19/14   [provider]  amoxicillin-clavulanate (AUGMENTIN) 875-125 MG tablet Take 1 tablet by mouth every 12 (twelve) hours for 3 days. 07/03/20 07/06/20  Raiford Noble Latif, DO  atorvastatin (LIPITOR) 20 MG tablet Take 1 tablet (20 mg total) by mouth daily. 12/17/18   Elayne Snare, MD  bisacodyl (DULCOLAX) 5 MG EC tablet Take 5 mg by mouth daily.    [provider]  Blood Glucose Monitoring Suppl (ACCU-CHEK GUIDE ME) w/Device KIT 1 each by Does not apply route 2 (two) times daily. Use accu chek guide me device to check blood sugar twice daily.DX:E11.65 12/23/18   Elayne Snare, MD  Cholecalciferol (VITAMIN D3) 5000 UNITS CAPS Take 5,000 Units by mouth.    [provider]  clotrimazole-betamethasone (LOTRISONE) cream Apply 1 application topically 2 (two) times daily.    [provider]  docusate sodium (COLACE) 100 MG capsule Take 1 capsule (100 mg total) by mouth 2 (two) times daily. 07/03/20   Raiford Noble Latif, DO  glucose blood (ACCU-CHEK GUIDE) test strip 1 each by Other route 2 (two) times daily. Use as instructed 12/17/18  Elayne Snare, MD  insulin NPH Human (NOVOLIN N RELION) 100 UNIT/ML injection 8 U in am and 12 U hs Patient taking differently: Inject 8-12 Units into the skin in the morning and at bedtime. 8 U in am and 12 U hs 11/06/19   Elayne Snare, MD  insulin regular (NOVOLIN R RELION) 100 units/mL injection INJECT 8 UNITS SUBCUTANEOUSLY WITH BREAKFAST AND LUNCH, AND 16 UNITS WITH DINNER Patient taking differently: Inject  8-16 Units into the skin 3 (three) times daily before meals. 8u with breakfast and lunch, and 16u with dinner 11/06/19   Elayne Snare, MD  niacin (SLO-NIACIN) 500 MG tablet Take 500 mg by mouth at bedtime.    [provider]  omeprazole (PRILOSEC) 40 MG capsule Take 40 mg by mouth daily.    [provider]  ondansetron (ZOFRAN) 4 MG tablet Take 1 tablet (4 mg total) by mouth every 6 (six) hours as needed for nausea. 07/03/20   Raiford Noble Latif, DO  oxyCODONE (OXY IR/ROXICODONE) 5 MG immediate release tablet Take 1-2 tablets (5-10 mg total) by mouth every 4 (four) hours as needed for moderate pain. 07/03/20   Sheikh, Omair Latif, DO  valsartan-hydrochlorothiazide (DIOVAN-HCT) 160-12.5 MG tablet Take 1 tablet by mouth daily.    [provider]    Physical Exam: BP (!) 134/55   Pulse 91   Temp 99.4 F (37.4 C) (Oral)   Resp 16   SpO2 100%   . General: 80 y.o. year-old female well developed well nourished in no acute distress.  Alert and oriented x3. . Cardiovascular: Regular rate and rhythm with no rubs or gallops.  No thyromegaly or JVD noted.  No lower extremity edema. 2/4 pulses in all 4 extremities. Marland Kitchen Respiratory: Clear to auscultation with no wheezes or rales. Good inspiratory effort. . Abdomen: Soft nondistended with normal bowel sounds x4 quadrants.  Lap chole incisions appear clean and healed. . Muskuloskeletal: No cyanosis, clubbing or edema noted bilaterally . Neuro: CN II-XII intact, strength, sensation, reflexes . Skin: No ulcerative lesions noted or rashes . Psychiatry: Judgement and insight appear normal. Mood is appropriate for condition and setting          Labs on Admission:  Basic Metabolic Panel: Recent Labs  Lab 06/30/20 0014 07/01/20 0611 07/02/20 0536 07/03/20 0506 07/06/20 1805  NA 137 135 133* 137 133*  K 3.6 3.4* 3.8 3.2* 3.2*  CL 103 101 104 106 97*  CO2 '25 26 24 25 26  ' GLUCOSE 306* 226* 179* 61* 144*  BUN '14 19 18 15 ' 7*   CREATININE 0.75 0.84 0.80 0.66 0.92  CALCIUM 9.6 8.9 8.1* 8.4* 9.1  MG  --  1.7 2.1 2.0  --   PHOS  --  2.2* 2.4* 1.5*  --    Liver Function Tests: Recent Labs  Lab 06/30/20 0014 07/01/20 0611 07/02/20 0536 07/03/20 0506 07/06/20 1805  AST 15 13* '26 25 16  ' ALT '17 13 26 31 23  ' ALKPHOS 70 56 48 59 81  BILITOT 1.0 1.2 0.8 0.6 0.9  PROT 7.2 6.3* 5.5* 5.5* 6.5  ALBUMIN 4.1 3.2* 2.5* 2.5* 2.9*   Recent Labs  Lab 06/30/20 0014 07/06/20 1805  LIPASE 27 43   No results for input(s): AMMONIA in the last 168 hours. CBC: Recent Labs  Lab 06/30/20 0014 07/01/20 0611 07/02/20 0536 07/03/20 0506 07/06/20 1805  WBC 11.2* 19.6* 9.4 10.1 11.6*  NEUTROABS  --  16.7* 7.4 7.8* 7.1  HGB 12.5 12.0 10.9* 10.7*  12.3  HCT 40.4 38.5 35.2* 34.7* 39.1  MCV 87.8 87.9 90.3 87.8 85.9  PLT 236 220 171 205 381   Cardiac Enzymes: No results for input(s): CKTOTAL, CKMB, CKMBINDEX, TROPONINI in the last 168 hours.  BNP (last 3 results) No results for input(s): BNP in the last 8760 hours.  ProBNP (last 3 results) No results for input(s): PROBNP in the last 8760 hours.  CBG: Recent Labs  Lab 07/03/20 0734 07/03/20 0755 07/03/20 0832 07/03/20 0923 07/03/20 1129  GLUCAP 53* 52* 70 116* 135*    Radiological Exams on Admission: CT ABDOMEN PELVIS W CONTRAST  Result Date: 07/06/2020 CLINICAL DATA:  Nonlocalized acute abdominal pain. EXAM: CT ABDOMEN AND PELVIS WITH CONTRAST TECHNIQUE: Multidetector CT imaging of the abdomen and pelvis was performed using the standard protocol following bolus administration of intravenous contrast. CONTRAST:  63m OMNIPAQUE IOHEXOL 300 MG/ML  SOLN COMPARISON:  CT abdomen pelvis 06/30/2020 FINDINGS: Lower chest: Trace right pleural effusion. Right lower lobe passive atelectasis. Coronary artery calcifications. Hepatobiliary: No focal liver abnormality is seen. Status post cholecystectomy with interval development of a 7.6 x 6.7 x 7.2 cm gallbladder fossa fluid  and gas collection (5:34). Slight peripheral enhancement noted. No biliary dilatation. Pancreas: No focal lesion. Normal pancreatic contour. No surrounding inflammatory changes. No main pancreatic ductal dilatation. Spleen: Normal in size without focal abnormality. Adrenals/Urinary Tract: No adrenal nodule bilaterally. Bilateral kidneys enhance symmetrically. No hydronephrosis. No hydroureter. The urinary bladder is unremarkable. Stomach/Bowel: Stomach is within normal limits. No evidence of bowel wall thickening or dilatation. Redemonstration of a third portion of the duodenum diverticulum (3:35). Fluid density within the lumen of the large bowel. Appendix appears normal. Vascular/Lymphatic: No abdominal aorta or iliac aneurysm. Mild-to-moderate atherosclerotic plaque of the aorta and its branches. No abdominal, pelvic, or inguinal lymphadenopathy. Reproductive: Multiple coarsely calcified lesions within the uterus likely represents degenerative uterine fibroids. Other: Trace peripancreatic free fluid. No intraperitoneal free gas. Musculoskeletal: No abdominal wall hernia or abnormality. No suspicious lytic or blastic osseous lesions. No acute displaced fracture. Multilevel degenerative changes of the spine. IMPRESSION: 1. Status post cholecystectomy with interval development of a 7.6 x 6.7 cm gallbladder fossa abscess . 2. Trace right pleural effusion. 3. Multiple degenerative uterine fibroids. 4. Fast-incision state. Electronically Signed   By: MIven FinnM.D.   On: 07/06/2020 22:18    EKG: I independently viewed the EKG done and my findings are as followed: Not available at the time of this visit.  Assessment/Plan Present on Admission: . Intra-abdominal abscess ([facility]  Active Problems:   Intra-abdominal abscess ([facility]  Gallbladder fossa abscess, post lap chole on 07/01/2020 from acute gangrenous cholecystitis General surgery was contacted by EDP. IR has been consulted for possible intra-abdominal  abscess drainage. Follow blood cultures Follow aerobic/anaerobic cultures from abscess drainage to guide therapy Continue IV Zosyn Management per IR and general surgery Monitor fever curve and WBC Pain control Bowel regimen CBC in the morning  Electrolyte disturbances likely secondary to GI losses with nausea vomiting and diarrhea. Serum sodium 133, continue IV fluid hydration Serum potassium 3.2, repleted intravenously, currently on LR KCl 40 mEq at 50 cc/h x 2 days Goal potassium greater than 4.0 Goal magnesium greater than 2.0. Repeat BMP in the morning.  Intractable nausea and vomiting, diarrhea IV antiemetics as needed Continue IV fluid hydration  Type 2 diabetes with hyperglycemia Last hemoglobin A1c 6.4 on 05/10/2020 Hold off home oral hypoglycemics Start insulin sliding scale.  Essential hypertension BP stable. Continue to monitor vital  signs.  GERD Resume home PPI.  Hyperlipidemia Resume home statin   DVT prophylaxis: Subcu Lovenox daily  Code Status: Full code.  Family Communication: None at bedside.  Disposition Plan: Admit to telemetry surgical.  Consults called: EDP contacted general surgery, IR for possible intra-abdominal abscess drain placement.  Admission status: Inpatient status.  Patient will require at least 2 midnights for further evaluation and treatment of present condition.   Status is: Inpatient    Dispo:  Patient From: Home  Planned Disposition: Home, possibly on 07/09/2020.  Medically stable for discharge: No, ongoing management of right upper quadrant abdominal pain, intra-abdominal abscess.         Kayleen Memos MD [facility] Pager [phone]  If 7PM-7AM, please contact night-coverage [email or web address] Password [facility]  07/06/2020, 11:09 PM

## 2020-07-06 NOTE — Consult Note (Signed)
Surgical Evaluation Requesting provider: Dr. Sabra Heck  Chief Complaint: abdominal pain  HPI: very nice 80yo woman who is POD 5 s/p laparoscopic cholecystectomy for gangrenous cholecystitis by Dr. Constance Haw. She  went home on POD 2 and had been having some nausea, RUQ pain and diarrhea which has worsened over the last couple days. Endorses low-grade fever and chills.   No Known Allergies  Past Medical History:  Diagnosis Date  . Chronic kidney disease    kidney stone  . Diabetes mellitus without complication ([place])   . Hypertension   . Stroke [facility])     Past Surgical History:  Procedure Laterality Date  . BREAST BIOPSY Left   . CATARACT EXTRACTION Bilateral 2018   Dr. Tommy Rainwater  . CHOLECYSTECTOMY N/A 07/01/2020   Procedure: LAPAROSCOPIC CHOLECYSTECTOMY;  Surgeon: Virl Cagey, MD;  Location: [facility] ORS;  Service: General;  Laterality: N/A;  . HAND SURGERY    . NO PAST SURGERIES      No family history on file.  Social History   Socioeconomic History  . Marital status: Married    Spouse name: Not on file  . Number of children: Not on file  . Years of education: Not on file  . Highest education level: Not on file  Occupational History  . Not on file  Tobacco Use  . Smoking status: Former Research scientist (life sciences)  . Smokeless tobacco: Never Used  Substance and Sexual Activity  . Alcohol use: No  . Drug use: No  . Sexual activity: Not on file  Other Topics Concern  . Not on file  Social History Narrative   [facility] [employer]   Caffeine use: no   Social Determinants of Radio broadcast assistant Strain: Not on file  Food Insecurity: Not on file  Transportation Needs: Not on file  Physical Activity: Not on file  Stress: Not on file  Social Connections: Not on file    No current facility-administered medications on file prior to encounter.   Current Outpatient Medications on File Prior to Encounter  Medication Sig Dispense Refill  . ACCU-CHEK SOFTCLIX LANCETS lancets Use  bid 100 each 3  . amLODipine (NORVASC) 5 MG tablet Take 5 mg by mouth 2 (two) times daily.    Marland Kitchen amoxicillin-clavulanate (AUGMENTIN) 875-125 MG tablet Take 1 tablet by mouth every 12 (twelve) hours for 3 days. 6 tablet 0  . atorvastatin (LIPITOR) 20 MG tablet Take 1 tablet (20 mg total) by mouth daily. 90 tablet 3  . bisacodyl (DULCOLAX) 5 MG EC tablet Take 5 mg by mouth daily.    . Blood Glucose Monitoring Suppl (ACCU-CHEK GUIDE ME) w/Device KIT 1 each by Does not apply route 2 (two) times daily. Use accu chek guide me device to check blood sugar twice daily.DX:E11.65 1 kit 0  . Cholecalciferol (VITAMIN D3) 5000 UNITS CAPS Take 5,000 Units by mouth.    . clotrimazole-betamethasone (LOTRISONE) cream Apply 1 application topically 2 (two) times daily.    Marland Kitchen docusate sodium (COLACE) 100 MG capsule Take 1 capsule (100 mg total) by mouth 2 (two) times daily. 10 capsule 0  . glucose blood (ACCU-CHEK GUIDE) test strip 1 each by Other route 2 (two) times daily. Use as instructed 100 each 3  . insulin NPH Human (NOVOLIN N RELION) 100 UNIT/ML injection 8 U in am and 12 U hs (Patient taking differently: Inject 8-12 Units into the skin in the morning and at bedtime. 8 U in am and 12 U hs) 20  mL 5  . insulin regular (NOVOLIN R RELION) 100 units/mL injection INJECT 8 UNITS SUBCUTANEOUSLY WITH BREAKFAST AND LUNCH, AND 16 UNITS WITH DINNER (Patient taking differently: Inject 8-16 Units into the skin 3 (three) times daily before meals. 8u with breakfast and lunch, and 16u with dinner) 20 mL 5  . niacin (SLO-NIACIN) 500 MG tablet Take 500 mg by mouth at bedtime.    Marland Kitchen omeprazole (PRILOSEC) 40 MG capsule Take 40 mg by mouth daily.    . ondansetron (ZOFRAN) 4 MG tablet Take 1 tablet (4 mg total) by mouth every 6 (six) hours as needed for nausea. 20 tablet 0  . oxyCODONE (OXY IR/ROXICODONE) 5 MG immediate release tablet Take 1-2 tablets (5-10 mg total) by mouth every 4 (four) hours as needed for moderate pain. 10 tablet 0   . valsartan-hydrochlorothiazide (DIOVAN-HCT) 160-12.5 MG tablet Take 1 tablet by mouth daily.      Review of Systems: a complete, 10pt review of systems was completed with pertinent positives and negatives as documented in the HPI  Physical Exam: Vitals:   07/06/20 2037 07/06/20 2145  BP: (!) 112/51 (!) 110/58  Pulse: 95 86  Resp: 18   Temp:    SpO2: 98% 96%   Gen: A&Ox3, no distress  Eyes: lids and conjunctivae normal, no icterus. Pupils equally round and reactive to light.  Neck: supple without mass or thyromegaly Chest: respiratory effort is normal. No crepitus or tenderness on palpation of the chest. Breath sounds equal.  Cardiovascular: RRR with palpable distal pulses, no pedal edema Gastrointestinal: soft, nondistended, TTP RUQ and flank. Incisions c/d/i without cellulitis or hematoma Lymphatic: no lymphadenopathy in the neck or groin Muscoloskeletal: no clubbing or cyanosis of the fingers.  Strength is symmetrical throughout.  Range of motion of bilateral upper and lower extremities normal without pain, crepitation or contracture. Neuro: cranial nerves grossly intact.  Sensation intact to light touch diffusely. Psych: appropriate mood and affect, normal insight/judgment intact  Skin: warm and dry   CBC Latest Ref Rng & Units 07/06/2020 07/03/2020 07/02/2020  WBC 4.0 - 10.5 K/uL 11.6(H) 10.1 9.4  Hemoglobin 12.0 - 15.0 g/dL 12.3 10.7(L) 10.9(L)  Hematocrit 36.0 - 46.0 % 39.1 34.7(L) 35.2(L)  Platelets 150 - 400 K/uL 381 205 171    CMP Latest Ref Rng & Units 07/06/2020 07/03/2020 07/02/2020  Glucose 70 - 99 mg/dL 144(H) 61(L) 179(H)  BUN 8 - 23 mg/dL 7(L) 15 18  Creatinine 0.44 - 1.00 mg/dL 0.92 0.66 0.80  Sodium 135 - 145 mmol/L 133(L) 137 133(L)  Potassium 3.5 - 5.1 mmol/L 3.2(L) 3.2(L) 3.8  Chloride 98 - 111 mmol/L 97(L) 106 104  CO2 22 - 32 mmol/L _0 Calcium 8.9 - 10.3 mg/dL 9.1 8.4(L) 8.1(L)  Total Protein 6.5 - 8.1 g/dL 6.5 5.5(L) 5.5(L)  Total Bilirubin 0.3 -  1.2 mg/dL 0.9 0.6 0.8  Alkaline Phos 38 - 126 U/L 81 59 48  AST 15 - 41 U/L _1 ALT 0 - 44 U/L _2 No results found for: INR, PROTIME  Imaging: CT ABDOMEN PELVIS W CONTRAST  Result Date: 07/06/2020 CLINICAL DATA:  Nonlocalized acute abdominal pain. EXAM: CT ABDOMEN AND PELVIS WITH CONTRAST TECHNIQUE: Multidetector CT imaging of the abdomen and pelvis was performed using the standard protocol following bolus administration of intravenous contrast. CONTRAST:  58m OMNIPAQUE IOHEXOL 300 MG/ML  SOLN COMPARISON:  CT abdomen pelvis 06/30/2020 FINDINGS: Lower chest: Trace right pleural effusion. Right lower  lobe passive atelectasis. Coronary artery calcifications. Hepatobiliary: No focal liver abnormality is seen. Status post cholecystectomy with interval development of a 7.6 x 6.7 x 7.2 cm gallbladder fossa fluid and gas collection (5:34). Slight peripheral enhancement noted. No biliary dilatation. Pancreas: No focal lesion. Normal pancreatic contour. No surrounding inflammatory changes. No main pancreatic ductal dilatation. Spleen: Normal in size without focal abnormality. Adrenals/Urinary Tract: No adrenal nodule bilaterally. Bilateral kidneys enhance symmetrically. No hydronephrosis. No hydroureter. The urinary bladder is unremarkable. Stomach/Bowel: Stomach is within normal limits. No evidence of bowel wall thickening or dilatation. Redemonstration of a third portion of the duodenum diverticulum (3:35). Fluid density within the lumen of the large bowel. Appendix appears normal. Vascular/Lymphatic: No abdominal aorta or iliac aneurysm. Mild-to-moderate atherosclerotic plaque of the aorta and its branches. No abdominal, pelvic, or inguinal lymphadenopathy. Reproductive: Multiple coarsely calcified lesions within the uterus likely represents degenerative uterine fibroids. Other: Trace peripancreatic free fluid. No intraperitoneal free gas. Musculoskeletal: No abdominal wall hernia or abnormality.  No suspicious lytic or blastic osseous lesions. No acute displaced fracture. Multilevel degenerative changes of the spine. IMPRESSION: 1. Status post cholecystectomy with interval development of a 7.6 x 6.7 cm gallbladder fossa abscess . 2. Trace right pleural effusion. 3. Multiple degenerative uterine fibroids. 4. Fast-incision state. Electronically Signed   By: Iven Finn M.D.   On: 07/06/2020 22:18     A/P: 7cm GB fossa abscess POD 5 s/p laparoscopic cholecystectomy. Recommend fluid resus, empiric broad spectrum IV ABx, consult IR for drain placement and will order HIDA to r/o bile leak. Surgery will follow.     Patient Active Problem List   Diagnosis Date Noted  . Acute gangrenous cholecystitis 07/01/2020  . Gangrenous cholecystitis 06/30/2020  . Background diabetic retinopathy ([place]) 01/15/2020  . Hypertension, essential 10/30/2014  . Hypercholesterolemia 11/06/2012  . Unspecified essential hypertension 11/06/2012  . Type II or unspecified type diabetes mellitus without mention of complication, uncontrolled 10/23/2012       Romana Juniper, MD [facility], [place]  See AMION to contact appropriate on-call provider

## 2020-07-07 ENCOUNTER — Inpatient Hospital Stay (HOSPITAL_COMMUNITY): Payer: Medicare HMO

## 2020-07-07 ENCOUNTER — Encounter (HOSPITAL_COMMUNITY): Payer: Self-pay | Admitting: Internal Medicine

## 2020-07-07 DIAGNOSIS — E1169 Type 2 diabetes mellitus with other specified complication: Secondary | ICD-10-CM

## 2020-07-07 DIAGNOSIS — K651 Peritoneal abscess: Secondary | ICD-10-CM | POA: Diagnosis not present

## 2020-07-07 DIAGNOSIS — I10 Essential (primary) hypertension: Secondary | ICD-10-CM | POA: Diagnosis not present

## 2020-07-07 DIAGNOSIS — E785 Hyperlipidemia, unspecified: Secondary | ICD-10-CM | POA: Diagnosis not present

## 2020-07-07 LAB — COMPREHENSIVE METABOLIC PANEL
ALT: 19 U/L (ref 0–44)
AST: 13 U/L — ABNORMAL LOW (ref 15–41)
Albumin: 2.4 g/dL — ABNORMAL LOW (ref 3.5–5.0)
Alkaline Phosphatase: 70 U/L (ref 38–126)
Anion gap: 7 (ref 5–15)
BUN: 8 mg/dL (ref 8–23)
CO2: 25 mmol/L (ref 22–32)
Calcium: 8.9 mg/dL (ref 8.9–10.3)
Chloride: 103 mmol/L (ref 98–111)
Creatinine, Ser: 1.06 mg/dL — ABNORMAL HIGH (ref 0.44–1.00)
GFR, Estimated: 53 mL/min — ABNORMAL LOW (ref >60)
Glucose, Bld: 128 mg/dL — ABNORMAL HIGH (ref 70–99)
Potassium: 4.1 mmol/L (ref 3.5–5.1)
Sodium: 135 mmol/L (ref 135–145)
Total Bilirubin: 0.7 mg/dL (ref 0.3–1.2)
Total Protein: 5.3 g/dL — ABNORMAL LOW (ref 6.5–8.1)

## 2020-07-07 LAB — CBC
HCT: 35.1 % — ABNORMAL LOW (ref 36.0–46.0)
Hemoglobin: 10.9 g/dL — ABNORMAL LOW (ref 12.0–15.0)
MCH: 27.1 pg (ref 26.0–34.0)
MCHC: 31.1 g/dL (ref 30.0–36.0)
MCV: 87.3 fL (ref 80.0–100.0)
Platelets: 354 10*3/uL (ref 150–400)
RBC: 4.02 MIL/uL (ref 3.87–5.11)
RDW: 13.9 % (ref 11.5–15.5)
WBC: 10.3 10*3/uL (ref 4.0–10.5)
nRBC: 0 % (ref 0.0–0.2)

## 2020-07-07 LAB — GLUCOSE, CAPILLARY
Glucose-Capillary: 119 mg/dL — ABNORMAL HIGH (ref 70–99)
Glucose-Capillary: 127 mg/dL — ABNORMAL HIGH (ref 70–99)
Glucose-Capillary: 104 mg/dL — ABNORMAL HIGH (ref 70–99)
Glucose-Capillary: 206 mg/dL — ABNORMAL HIGH (ref 70–99)

## 2020-07-07 LAB — MAGNESIUM: Magnesium: 1.8 mg/dL (ref 1.7–2.4)

## 2020-07-07 LAB — RESP PANEL BY RT-PCR (FLU A&B, COVID) ARPGX2
Influenza A by PCR: NEGATIVE
Influenza B by PCR: NEGATIVE
SARS Coronavirus 2 by RT PCR: NEGATIVE

## 2020-07-07 LAB — PROTIME-INR
INR: 1.1 (ref 0.8–1.2)
Prothrombin Time: 13.9 seconds (ref 11.4–15.2)

## 2020-07-07 LAB — PHOSPHORUS: Phosphorus: 3.8 mg/dL (ref 2.5–4.6)

## 2020-07-07 MED ORDER — MIDAZOLAM HCL 2 MG/2ML IJ SOLN
INTRAMUSCULAR | Status: AC | PRN
  Administered 2020-07-07 (×3): 1 mg via INTRAVENOUS

## 2020-07-07 MED ORDER — INSULIN DETEMIR 100 UNIT/ML ~~LOC~~ SOLN
8.0000 [IU] | Freq: Two times a day (BID) | SUBCUTANEOUS | Status: DC
  Administered 2020-07-07 – 2020-07-15 (×16): 8 [IU] via SUBCUTANEOUS
  Filled 2020-07-07 (×18): qty 0.08

## 2020-07-07 MED ORDER — FENTANYL CITRATE (PF) 100 MCG/2ML IJ SOLN
INTRAMUSCULAR | Status: AC
  Filled 2020-07-07: qty 4

## 2020-07-07 MED ORDER — MIDAZOLAM HCL 2 MG/2ML IJ SOLN
INTRAMUSCULAR | Status: AC
  Filled 2020-07-07: qty 4

## 2020-07-07 MED ORDER — PROCHLORPERAZINE EDISYLATE 10 MG/2ML IJ SOLN
10.0000 mg | Freq: Four times a day (QID) | INTRAMUSCULAR | Status: DC | PRN
  Administered 2020-07-08 – 2020-07-14 (×2): 10 mg via INTRAVENOUS
  Filled 2020-07-07 (×2): qty 2

## 2020-07-07 MED ORDER — FENTANYL CITRATE (PF) 100 MCG/2ML IJ SOLN
INTRAMUSCULAR | Status: AC | PRN
  Administered 2020-07-07 (×2): 50 ug via INTRAVENOUS

## 2020-07-07 MED ORDER — ENOXAPARIN SODIUM 40 MG/0.4ML IJ SOSY
40.0000 mg | PREFILLED_SYRINGE | INTRAMUSCULAR | Status: DC
  Administered 2020-07-08 – 2020-07-15 (×8): 40 mg via SUBCUTANEOUS
  Filled 2020-07-07 (×8): qty 0.4

## 2020-07-07 MED ORDER — PIPERACILLIN-TAZOBACTAM 3.375 G IVPB
3.3750 g | Freq: Three times a day (TID) | INTRAVENOUS | Status: DC
  Administered 2020-07-07 – 2020-07-13 (×19): 3.375 g via INTRAVENOUS
  Filled 2020-07-07 (×19): qty 50

## 2020-07-07 MED ORDER — TECHNETIUM TC 99M MEBROFENIN IV KIT
5.4000 | PACK | Freq: Once | INTRAVENOUS | Status: AC | PRN
  Administered 2020-07-07: 5.4 via INTRAVENOUS

## 2020-07-07 MED ORDER — SODIUM CHLORIDE 0.9% FLUSH
5.0000 mL | Freq: Three times a day (TID) | INTRAVENOUS | Status: DC
  Administered 2020-07-07 – 2020-07-15 (×21): 5 mL

## 2020-07-07 NOTE — ED Notes (Signed)
This RN attempted to give report to floor. It was stated that they would need about "an hour or so" before they could get report on this patient. ED charge made aware.

## 2020-07-07 NOTE — Evaluation (Signed)
Occupational Therapy Evaluation Patient Details Name: Sandra Washington MRN: [id number] DOB: October 05, 1940 Today's Date: 07/07/2020    History of Present Illness Pt is an 80 yo female s/p 5/3: postcholecystectomy. Pt with postoperative pain, N/V, fever. Pt PMHx: gallbladder sx, DM, CVA.   Clinical Impression   Pt PTA: Pt independent with ADL and mobility. Pt currently, modified independence with ADL and mobility, but fatigues easily. Pt currently, performing toilet hygiene, grooming at sink and simulating tub transfer with no physical assist, no AD. Pt modified independent at this time. Pt using figure 4 technique for lower body ADL. Pt would benefit from continued OT skilled services. OT following acutely.     Follow Up Recommendations  No OT follow up;Supervision - Intermittent    Equipment Recommendations  3 in 1 bedside commode    Recommendations for Other Services       Precautions / Restrictions Precautions Precautions: Fall Precaution Comments: x1 fall Restrictions Weight Bearing Restrictions: No      Mobility Bed Mobility Overal bed mobility: Modified Independent                  Transfers Overall transfer level: Modified independent Equipment used: None                  Balance Overall balance assessment: No apparent balance deficits (not formally assessed)                                         ADL either performed or assessed with clinical judgement   ADL Overall ADL's : At baseline;Modified independent                                       General ADL Comments: Pt performing toilet hygiene, grooming at sink and simulating tub transfer with no physical assist. Pt modified independent at this time. Pt using figure 4 technique for lower body ADL.     Vision Baseline Vision/History: Wears glasses Wears Glasses: At all times Patient Visual Report: Other (comment) (does not have glasses with her) Vision  Assessment?: Yes Eye Alignment: Within Functional Limits Ocular Range of Motion: Within Functional Limits Alignment/Gaze Preference: Within Defined Limits     Perception     Praxis      Pertinent Vitals/Pain       Hand Dominance Right   Extremity/Trunk Assessment Upper Extremity Assessment Upper Extremity Assessment: Overall WFL for tasks assessed   Lower Extremity Assessment Lower Extremity Assessment: Overall WFL for tasks assessed   Cervical / Trunk Assessment Cervical / Trunk Assessment: Normal   Communication Communication Communication: No difficulties   Cognition Arousal/Alertness: Awake/alert Behavior During Therapy: WFL for tasks assessed/performed Overall Cognitive Status: Within Functional Limits for tasks assessed                                     General Comments  VSS. No c/o nausea. Pt still having diarrhea.    Exercises     Shoulder Instructions      Home Living Family/patient expects to be discharged to:: Private residence Living Arrangements: Alone Available Help at Discharge: Family;Available PRN/intermittently Type of Home: House Home Access: Stairs to enter [employer] of Steps: front 7 steps; 1 in back  Home Layout: One level     Bathroom Shower/Tub: Teacher, early years/pre: Handicapped height     Home Equipment: Grab bars - tub/shower;Shower seat          Prior Functioning/Environment Level of Independence: Independent        Comments: Independent prior to gallbladder sx; pt was assisted with family with sink baths after sx, but mostly independent, but then not able to keep food down.        OT Problem List: Decreased activity tolerance      OT Treatment/Interventions:      OT Goals(Current goals can be found in the care plan section) Acute Rehab OT Goals Patient Stated Goal: to go home OT Goal Formulation: With patient Time For Goal Achievement: 07/21/20 Potential to Achieve  Goals: Good ADL Goals Additional ADL Goal #1: Pt will state 3 energy conservation techniques for OOB ADL and mobility. Additional ADL Goal #2: Pt will perform OOB ADL x15 mins in standing with 1 seated rest break to increase endurance for ADL.  OT Frequency:     Barriers to D/C:            Co-evaluation              AM-PAC OT "6 Clicks" Daily Activity     Outcome Measure Help from another person eating meals?: None Help from another person taking care of personal grooming?: None Help from another person toileting, which includes using toliet, bedpan, or urinal?: None Help from another person bathing (including washing, rinsing, drying)?: None Help from another person to put on and taking off regular upper body clothing?: None Help from another person to put on and taking off regular lower body clothing?: None 6 Click Score: 24   End of Session Nurse Communication: Mobility status;Other (comment) (no need for chair alarm)  Activity Tolerance: Patient tolerated treatment well Patient left: in chair;with call bell/phone within reach  OT Visit Diagnosis: Unsteadiness on feet (R26.81)                Time: 0815-0900 OT Time Calculation (min): 45 min Charges:  OT General Charges $OT Visit: 1 Visit OT Evaluation $OT Eval Moderate Complexity: 1 Mod OT Treatments $Self Care/Home Management : 23-37 mins  Jefferey Pica, OTR/L Acute Rehabilitation Services Pager: [phone] Office: [phone]  Sandra Washington C 07/07/2020, 9:45 AM

## 2020-07-07 NOTE — Plan of Care (Signed)
  Problem: Education: Goal: Knowledge of General Education information will improve Description: Including pain rating scale, medication(s)/side effects and non-pharmacologic comfort measures Outcome: Progressing   Problem: Clinical Measurements: Goal: Respiratory complications will improve Outcome: Progressing   Problem: Activity: Goal: Risk for activity intolerance will decrease Outcome: Progressing   Problem: Nutrition: Goal: Adequate nutrition will be maintained Outcome: Progressing   Problem: Coping: Goal: Level of anxiety will decrease Outcome: Progressing   Problem: Elimination: Goal: Will not experience complications related to bowel motility Outcome: Progressing Goal: Will not experience complications related to urinary retention Outcome: Progressing   Problem: Pain Managment: Goal: General experience of comfort will improve Outcome: Progressing   Problem: Safety: Goal: Ability to remain free from injury will improve Outcome: Progressing

## 2020-07-07 NOTE — Progress Notes (Signed)
PHARMACY - PHYSICIAN COMMUNICATION CRITICAL VALUE ALERT - BLOOD CULTURE IDENTIFICATION (BCID)  Sandra Washington is an 80 y.o. female who presented to [facility] on 07/06/2020 with a chief complaint of intraabdominal abscess.   Name of physician (or Provider) Contacted: [facility]  Current antibiotics: Zosyn 3.375g IV EI q8h  Changes to prescribed antibiotics recommended:  Patient is on recommended antibiotics - No changes needed >> likely contaminant  Results: 1 of 2 gram positive rods (not speciated)  Arrie Senate, PharmD, BCPS, [facility] Clinical Pharmacist [phone] Please check AMION for all [facility] numbers 07/07/2020

## 2020-07-07 NOTE — Progress Notes (Signed)
Subjective/Chief Complaint: Complains of some RUQ pain   Objective: Vital signs in last 24 hours: Temp:  [98 F (36.7 C)-99.4 F (37.4 C)] 98.3 F (36.8 C) (05/11 0805) Pulse Rate:  [84-95] 84 (05/11 0805) Resp:  [14-19] 17 (05/11 0805) BP: (110-134)/(51-78) 130/51 (05/11 0805) SpO2:  [94 %-100 %] 97 % (05/11 0805)    Intake/Output from previous day: No intake/output data recorded. Intake/Output this shift: No intake/output data recorded.  General appearance: alert and cooperative Resp: clear to auscultation bilaterally Cardio: regular rate and rhythm GI: soft, moderate RUQ pain  Lab Results:  Recent Labs    07/06/20 1805 07/07/20 0238  WBC 11.6* 10.3  HGB 12.3 10.9*  HCT 39.1 35.1*  PLT 381 354   BMET Recent Labs    07/06/20 1805 07/07/20 0238  NA 133* 135  K 3.2* 4.1  CL 97* 103  CO2 26 25  GLUCOSE 144* 128*  BUN 7* 8  CREATININE 0.92 1.06*  CALCIUM 9.1 8.9   PT/INR No results for input(s): LABPROT, INR in the last 72 hours. ABG No results for input(s): PHART, HCO3 in the last 72 hours.  Invalid input(s): PCO2, PO2  Studies/Results: CT ABDOMEN PELVIS W CONTRAST  Result Date: 07/06/2020 CLINICAL DATA:  Nonlocalized acute abdominal pain. EXAM: CT ABDOMEN AND PELVIS WITH CONTRAST TECHNIQUE: Multidetector CT imaging of the abdomen and pelvis was performed using the standard protocol following bolus administration of intravenous contrast. CONTRAST:  23mL OMNIPAQUE IOHEXOL 300 MG/ML  SOLN COMPARISON:  CT abdomen pelvis 06/30/2020 FINDINGS: Lower chest: Trace right pleural effusion. Right lower lobe passive atelectasis. Coronary artery calcifications. Hepatobiliary: No focal liver abnormality is seen. Status post cholecystectomy with interval development of a 7.6 x 6.7 x 7.2 cm gallbladder fossa fluid and gas collection (5:34). Slight peripheral enhancement noted. No biliary dilatation. Pancreas: No focal lesion. Normal pancreatic contour. No surrounding  inflammatory changes. No main pancreatic ductal dilatation. Spleen: Normal in size without focal abnormality. Adrenals/Urinary Tract: No adrenal nodule bilaterally. Bilateral kidneys enhance symmetrically. No hydronephrosis. No hydroureter. The urinary bladder is unremarkable. Stomach/Bowel: Stomach is within normal limits. No evidence of bowel wall thickening or dilatation. Redemonstration of a third portion of the duodenum diverticulum (3:35). Fluid density within the lumen of the large bowel. Appendix appears normal. Vascular/Lymphatic: No abdominal aorta or iliac aneurysm. Mild-to-moderate atherosclerotic plaque of the aorta and its branches. No abdominal, pelvic, or inguinal lymphadenopathy. Reproductive: Multiple coarsely calcified lesions within the uterus likely represents degenerative uterine fibroids. Other: Trace peripancreatic free fluid. No intraperitoneal free gas. Musculoskeletal: No abdominal wall hernia or abnormality. No suspicious lytic or blastic osseous lesions. No acute displaced fracture. Multilevel degenerative changes of the spine. IMPRESSION: 1. Status post cholecystectomy with interval development of a 7.6 x 6.7 cm gallbladder fossa abscess . 2. Trace right pleural effusion. 3. Multiple degenerative uterine fibroids. 4. Fast-incision state. Electronically Signed   By: Iven Finn M.D.   On: 07/06/2020 22:18    Anti-infectives: Anti-infectives (From admission, onward)   Start     Dose/Rate Route Frequency Ordered Stop   07/07/20 0600  piperacillin-tazobactam (ZOSYN) IVPB 3.375 g        3.375 g 12.5 mL/hr over 240 Minutes Intravenous Every 8 hours 07/07/20 0152     07/06/20 2230  piperacillin-tazobactam (ZOSYN) IVPB 3.375 g        3.375 g 100 mL/hr over 30 Minutes Intravenous  Once 07/06/20 2225 07/07/20 0002      Assessment/Plan: s/p * No surgery found *  Plan for HIDA to rule out bile leak  Abscess in gallbladder fossa. Will need IR drain today as well Continue abx   LOS: 1 day    Autumn Messing III 07/07/2020

## 2020-07-07 NOTE — Procedures (Signed)
Pre procedural Dx: Post op fluid collection within the gall bladder fossa Post procedural Dx: Same  Technically successful CT guided placed of a 12 Fr drainage catheter placement into the gall bladder fossa yielding 175 cc of bilious appearing fluid.    A representative aspirated sample was capped and sent to the laboratory for analysis.    EBL: Trace Complications: None immediate  Ronny Bacon, MD Pager #: [phone]

## 2020-07-07 NOTE — Progress Notes (Signed)
PROGRESS NOTE    MARRANDA EMMERLING  [id number] DOB: [birth year] DOA: 07/06/2020 PCP: Lucianne Lei, MD   Brief Narrative: Sandra Washington is a 80 y.o. female with history of hypertension, hyperlipidemia, diabetes mellitus type 2, GERD.  Patient presents secondary to right upper quadrant pain, nausea, vomiting and found to have a large intra-abdominal abscess located in her gallbladder fossa.  General surgery and interventional radiology consulted on admission.  Plan for percutaneous drain placement.  IV antibiotics started on admission.   Assessment & Plan:   Principal Problem:   Intra-abdominal abscess ([place]) Active Problems:   Hypertension, essential   Type 2 diabetes mellitus with hyperlipidemia (H[facility])   Intra-abdominal abscess vs bile leak CT abdomen/pelvis suggests large abscess related to recent laparoscopic cholecystectomy. General surgery consulted on admission along with IR. Zosyn IV initiated empirically. Blood cultures obtained on admission -Continue Zosyn IV; follow-up blood cultures -General surgery recommendations: HIDA scan -IR recommendations: plan for percutaneous drain placement today  Intractable nausea/vomiting/diarrhea Symptoms appear to be improved. IV fluids started. Patient currently is NPO for procedure -Continue LR IV fluids while NPO -Continue compazine prn  Diabetes mellitus, type 2 Last hemoglobin A1c of 6.4% from March 2022.  Patient is managed as an outpatient on insulin NPH 8 units in a.m. and 12 units in the evening in addition to insulin regular 8 units with breakfast and lunch in addition to 16 units with dinner.  On admission, patient started on sliding scale. -Start Levemir 8 units twice daily while inpatient and continue sliding scale insulin  Primary hypertension Patient is on valsartan hydrochlorothiazide as an outpatient.  Blood pressure is slightly uncontrolled.  Patient was started on amlodipine 5 mg while  inpatient. -Continue amlodipine for now but will transition to valsartan-hydrochlorothiazide once creatinine/fluid status has stabilized.  GERD -Continue Protonix   DVT prophylaxis: SCDs Code Status:   Code Status: Full Code Family Communication: None at bedside Disposition Plan: Anticipate discharge likely home in 3 to 5 days pending IR and general surgery recommendations/management in addition to transition to oral antibiotics   Consultants:   General surgery  Interventional radiology  Procedures:   None  Antimicrobials:  Zosyn IV   Subjective: Some abdominal pain but no issues overnight. Afebrile.  Objective: Vitals:   07/07/20 0045 07/07/20 0100 07/07/20 0414 07/07/20 0805  BP: (!) 123/56 (!) 117/52 131/78 (!) 130/51  Pulse: 90 84 91 84  Resp: 15 19  17   Temp:   98 F (36.7 C) 98.3 F (36.8 C)  TempSrc:   Oral Oral  SpO2: 95% 94% 98% 97%   No intake or output data in the 24 hours ending 07/07/20 0917 There were no vitals filed for this visit.  Examination:  General exam: Appears calm and comfortable Respiratory system: Clear to auscultation. Respiratory effort normal. Cardiovascular system: S1 & S2 heard, RRR. No murmurs, rubs, gallops or clicks. Gastrointestinal system: Abdomen is nondistended, soft and tender in RUQ/epigastric areas. Laparoscopic incisions are intact and well appearing with no drainage. Normal bowel sounds heard. Central nervous system: Alert and oriented. No focal neurological deficits. Musculoskeletal: No edema. No calf tenderness Skin: No cyanosis. No rashes Psychiatry: Judgement and insight appear normal. Mood & affect appropriate.     Data Reviewed: I have personally reviewed following labs and imaging studies  CBC Lab Results  Component Value Date   WBC 10.3 07/07/2020   RBC 4.02 07/07/2020   HGB 10.9 (L) 07/07/2020   HCT 35.1 (L) 07/07/2020   MCV 87.3  07/07/2020   MCH 27.1 07/07/2020   PLT 354 07/07/2020   MCHC 31.1  07/07/2020   RDW 13.9 07/07/2020   LYMPHSABS 2.4 07/06/2020   MONOABS 1.2 (H) 07/06/2020   EOSABS 0.1 07/06/2020   BASOSABS 0.1 25/06/3974     Last metabolic panel Lab Results  Component Value Date   NA 135 07/07/2020   K 4.1 07/07/2020   CL 103 07/07/2020   CO2 25 07/07/2020   BUN 8 07/07/2020   CREATININE 1.06 (H) 07/07/2020   GLUCOSE 128 (H) 07/07/2020   GFRNONAA 53 (L) 07/07/2020   GFRAA >90 04/09/2012   CALCIUM 8.9 07/07/2020   PHOS 3.8 07/07/2020   PROT 5.3 (L) 07/07/2020   ALBUMIN 2.4 (L) 07/07/2020   BILITOT 0.7 07/07/2020   ALKPHOS 70 07/07/2020   AST 13 (L) 07/07/2020   ALT 19 07/07/2020   ANIONGAP 7 07/07/2020    CBG (last 3)  Recent Labs    07/07/20 0803  GLUCAP 119*     GFR: Estimated Creatinine Clearance: 46.3 mL/min (A) (by C-G formula based on SCr of 1.06 mg/dL (H)).  Coagulation Profile: No results for input(s): INR, PROTIME in the last 168 hours.  Recent Results (from the past 240 hour(s))  SARS CORONAVIRUS 2 (TAT 6-24 HRS) Nasopharyngeal Nasopharyngeal Swab     Status: None   Collection Time: 06/30/20  5:50 AM   Specimen: Nasopharyngeal Swab  Result Value Ref Range Status   SARS Coronavirus 2 NEGATIVE NEGATIVE Final    Comment: (NOTE) SARS-CoV-2 target nucleic acids are NOT DETECTED.  The SARS-CoV-2 RNA is generally detectable in upper and lower respiratory specimens during the acute phase of infection. Negative results do not preclude SARS-CoV-2 infection, do not rule out co-infections with other pathogens, and should not be used as the sole basis for treatment or other patient management decisions. Negative results must be combined with clinical observations, patient history, and epidemiological information. The expected result is Negative.  Fact Sheet for Patients: SugarRoll.be  Fact Sheet for Healthcare Providers: https://www.woods-mathews.com/  This test is not yet approved or cleared  by the [place] FDA and  has been authorized for detection and/or diagnosis of SARS-CoV-2 by FDA under an Emergency Use Authorization (EUA). This EUA will remain  in effect (meaning this test can be used) for the duration of the COVID-19 declaration under Se ction 564(b)(1) of the Act, 21 U.S.C. section 360bbb-3(b)(1), unless the authorization is terminated or revoked sooner.  Performed at [place] [facility], [place] [address] [place], [place] [address]   MRSA PCR Screening     Status: None   Collection Time: 07/01/20  2:57 AM   Specimen: Nasal Mucosa; Nasopharyngeal  Result Value Ref Range Status   MRSA by PCR NEGATIVE NEGATIVE Final    Comment:        The GeneXpert MRSA Assay (FDA approved for NASAL specimens only), is one component of a comprehensive MRSA colonization surveillance program. It is not intended to diagnose MRSA infection nor to guide or monitor treatment for MRSA infections. Performed at [facility], [address] [place], [place] [address]   Resp Panel by RT-PCR (Flu A&B, Covid) Nasopharyngeal Swab     Status: None   Collection Time: 07/06/20 11:53 PM   Specimen: Nasopharyngeal Swab; Nasopharyngeal(NP) swabs in vial transport medium  Result Value Ref Range Status   SARS Coronavirus 2 by RT PCR NEGATIVE NEGATIVE Final    Comment: (NOTE) SARS-CoV-2 target nucleic acids are NOT DETECTED.  The SARS-CoV-2  RNA is generally detectable in upper respiratory specimens during the acute phase of infection. The lowest concentration of SARS-CoV-2 viral copies this assay can detect is 138 copies/mL. A negative result does not preclude SARS-Cov-2 infection and should not be used as the sole basis for treatment or other patient management decisions. A negative result may occur with  improper specimen collection/handling, submission of specimen other than nasopharyngeal swab, presence of viral mutation(s) within the areas targeted by this assay, and inadequate number  of viral copies(<138 copies/mL). A negative result must be combined with clinical observations, patient history, and epidemiological information. The expected result is Negative.  Fact Sheet for Patients:  EntrepreneurPulse.com.au  Fact Sheet for Healthcare Providers:  IncredibleEmployment.be  This test is no t yet approved or cleared by the [place] FDA and  has been authorized for detection and/or diagnosis of SARS-CoV-2 by FDA under an Emergency Use Authorization (EUA). This EUA will remain  in effect (meaning this test can be used) for the duration of the COVID-19 declaration under Section 564(b)(1) of the Act, 21 U.S.C.section 360bbb-3(b)(1), unless the authorization is terminated  or revoked sooner.       Influenza A by PCR NEGATIVE NEGATIVE Final   Influenza B by PCR NEGATIVE NEGATIVE Final    Comment: (NOTE) The Xpert Xpress SARS-CoV-2/FLU/RSV plus assay is intended as an aid in the diagnosis of influenza from Nasopharyngeal swab specimens and should not be used as a sole basis for treatment. Nasal washings and aspirates are unacceptable for Xpert Xpress SARS-CoV-2/FLU/RSV testing.  Fact Sheet for Patients: EntrepreneurPulse.com.au  Fact Sheet for Healthcare Providers: IncredibleEmployment.be  This test is not yet approved or cleared by the [place] FDA and has been authorized for detection and/or diagnosis of SARS-CoV-2 by FDA under an Emergency Use Authorization (EUA). This EUA will remain in effect (meaning this test can be used) for the duration of the COVID-19 declaration under Section 564(b)(1) of the Act, 21 U.S.C. section 360bbb-3(b)(1), unless the authorization is terminated or revoked.  Performed at [facility], [place] [address], [place], [place] [address]         Radiology Studies: CT ABDOMEN PELVIS W CONTRAST  Result Date: 07/06/2020 CLINICAL DATA:   Nonlocalized acute abdominal pain. EXAM: CT ABDOMEN AND PELVIS WITH CONTRAST TECHNIQUE: Multidetector CT imaging of the abdomen and pelvis was performed using the standard protocol following bolus administration of intravenous contrast. CONTRAST:  21mL OMNIPAQUE IOHEXOL 300 MG/ML  SOLN COMPARISON:  CT abdomen pelvis 06/30/2020 FINDINGS: Lower chest: Trace right pleural effusion. Right lower lobe passive atelectasis. Coronary artery calcifications. Hepatobiliary: No focal liver abnormality is seen. Status post cholecystectomy with interval development of a 7.6 x 6.7 x 7.2 cm gallbladder fossa fluid and gas collection (5:34). Slight peripheral enhancement noted. No biliary dilatation. Pancreas: No focal lesion. Normal pancreatic contour. No surrounding inflammatory changes. No main pancreatic ductal dilatation. Spleen: Normal in size without focal abnormality. Adrenals/Urinary Tract: No adrenal nodule bilaterally. Bilateral kidneys enhance symmetrically. No hydronephrosis. No hydroureter. The urinary bladder is unremarkable. Stomach/Bowel: Stomach is within normal limits. No evidence of bowel wall thickening or dilatation. Redemonstration of a third portion of the duodenum diverticulum (3:35). Fluid density within the lumen of the large bowel. Appendix appears normal. Vascular/Lymphatic: No abdominal aorta or iliac aneurysm. Mild-to-moderate atherosclerotic plaque of the aorta and its branches. No abdominal, pelvic, or inguinal lymphadenopathy. Reproductive: Multiple coarsely calcified lesions within the uterus likely represents degenerative uterine fibroids. Other: Trace peripancreatic free fluid. No intraperitoneal free gas.  Musculoskeletal: No abdominal wall hernia or abnormality. No suspicious lytic or blastic osseous lesions. No acute displaced fracture. Multilevel degenerative changes of the spine. IMPRESSION: 1. Status post cholecystectomy with interval development of a 7.6 x 6.7 cm gallbladder fossa abscess .  2. Trace right pleural effusion. 3. Multiple degenerative uterine fibroids. 4. Fast-incision state. Electronically Signed   By: Iven Finn M.D.   On: 07/06/2020 22:18        Scheduled Meds: . amLODipine  5 mg Oral Daily  . atorvastatin  20 mg Oral Daily  . enoxaparin (LOVENOX) injection  40 mg Subcutaneous QHS  . insulin aspart  0-9 Units Subcutaneous TID WC  . pantoprazole  40 mg Oral Daily  . senna-docusate  2 tablet Oral QHS   Continuous Infusions: . lactated ringers with kcl 50 mL/hr at 07/07/20 0036  . piperacillin-tazobactam (ZOSYN)  IV 3.375 g (07/07/20 0626)     LOS: 1 day     Cordelia Poche, MD [facility] 07/07/2020, 9:17 AM  If 7PM-7AM, please contact night-coverage [email or web address]

## 2020-07-07 NOTE — Consult Note (Signed)
Chief Complaint: Patient was seen in consultation today for gall bladder fossa abscess  Referring Physician(s): Dr. Kae Heller  Supervising Physician: Corrie Mckusick  Patient Status: [facility] - In-pt  History of Present Illness: Sandra Washington is a 80 y.o. female with past medical history of laprascopic cholecystectomy for gangrenous cholecystitis 07/01/20.  She tolerated the surgery well and went home on POD 2. Now presents with abdominal pain, nausea, vomiting, intermittent fevers/chills.  CT Abdomen Pelvis performed reveals gall bladder fossa abscess. IR consulted for aspiration and drainage.   Ms. Dicke is assessed at bedside this AM.  She continues to have persistent diarrhea.  She is alert and oriented, although has a legal guardian (shared among her children per her report). Consult interrupted by bouts of diarrhea. States it is ok to contact her son or daughter for all medical needs and concerns. NPO.   Past Medical History:  Diagnosis Date  . Chronic kidney disease    kidney stone  . Diabetes mellitus without complication ([place])   . Hypertension   . Stroke [facility])     Past Surgical History:  Procedure Laterality Date  . BREAST BIOPSY Left   . CATARACT EXTRACTION Bilateral 2018   Dr. Tommy Rainwater  . CHOLECYSTECTOMY N/A 07/01/2020   Procedure: LAPAROSCOPIC CHOLECYSTECTOMY;  Surgeon: Virl Cagey, MD;  Location: [facility];  Service: General;  Laterality: N/A;  . HAND SURGERY    . NO PAST SURGERIES      Allergies: Patient has no known allergies.  Medications: Prior to Admission medications   Medication Sig Start Date End Date Taking? Authorizing Provider  ACCU-CHEK SOFTCLIX LANCETS lancets Use bid 05/01/17   Elayne Snare, MD  amLODipine (NORVASC) 5 MG tablet Take 5 mg by mouth 2 (two) times daily. 10/19/14   [provider]  atorvastatin (LIPITOR) 20 MG tablet Take 1 tablet (20 mg total) by mouth daily. 12/17/18   Elayne Snare, MD  bisacodyl (DULCOLAX) 5 MG EC  tablet Take 5 mg by mouth daily.    [provider]  Blood Glucose Monitoring Suppl (ACCU-CHEK GUIDE ME) w/Device KIT 1 each by Does not apply route 2 (two) times daily. Use accu chek guide me device to check blood sugar twice daily.DX:E11.65 12/23/18   Elayne Snare, MD  Cholecalciferol (VITAMIN D3) 5000 UNITS CAPS Take 5,000 Units by mouth.    [provider]  clotrimazole-betamethasone (LOTRISONE) cream Apply 1 application topically 2 (two) times daily.    [provider]  docusate sodium (COLACE) 100 MG capsule Take 1 capsule (100 mg total) by mouth 2 (two) times daily. 07/03/20   Raiford Noble Latif, DO  glucose blood (ACCU-CHEK GUIDE) test strip 1 each by Other route 2 (two) times daily. Use as instructed 12/17/18   Elayne Snare, MD  insulin NPH Human (NOVOLIN N RELION) 100 UNIT/ML injection 8 U in am and 12 U hs Patient taking differently: Inject 8-12 Units into the skin in the morning and at bedtime. 8 U in am and 12 U hs 11/06/19   Elayne Snare, MD  insulin regular (NOVOLIN R RELION) 100 units/mL injection INJECT 8 UNITS SUBCUTANEOUSLY WITH BREAKFAST AND LUNCH, AND 16 UNITS WITH DINNER Patient taking differently: Inject 8-16 Units into the skin 3 (three) times daily before meals. 8u with breakfast and lunch, and 16u with dinner 11/06/19   Elayne Snare, MD  niacin (SLO-NIACIN) 500 MG tablet Take 500 mg by mouth at bedtime.    [provider]  omeprazole (PRILOSEC) 40 MG capsule  Take 40 mg by mouth daily.    [provider]  ondansetron (ZOFRAN) 4 MG tablet Take 1 tablet (4 mg total) by mouth every 6 (six) hours as needed for nausea. 07/03/20   Raiford Noble Latif, DO  oxyCODONE (OXY IR/ROXICODONE) 5 MG immediate release tablet Take 1-2 tablets (5-10 mg total) by mouth every 4 (four) hours as needed for moderate pain. 07/03/20   Sheikh, Omair Latif, DO  valsartan-hydrochlorothiazide (DIOVAN-HCT) 160-12.5 MG tablet Take 1 tablet by mouth daily.    [provider]     History reviewed. No pertinent family history.  Social History   Socioeconomic History  . Marital status: Married    Spouse name: Not on file  . Number of children: Not on file  . Years of education: Not on file  . Highest education level: Not on file  Occupational History  . Not on file  Tobacco Use  . Smoking status: Former Research scientist (life sciences)  . Smokeless tobacco: Never Used  Substance and Sexual Activity  . Alcohol use: No  . Drug use: No  . Sexual activity: Not on file  Other Topics Concern  . Not on file  Social History Narrative   [facility]   Caffeine use: no   Social Determinants of Radio broadcast assistant Strain: Not on file  Food Insecurity: Not on file  Transportation Needs: Not on file  Physical Activity: Not on file  Stress: Not on file  Social Connections: Not on file     Review of Systems: A 12 point ROS discussed and pertinent positives are indicated in the HPI above.  All other systems are negative.  Review of Systems  Constitutional: Negative for fatigue and fever.  Gastrointestinal: Positive for abdominal pain, diarrhea, nausea and vomiting.  Genitourinary: Negative for dysuria.  Musculoskeletal: Negative for back pain.  Psychiatric/Behavioral: Negative for behavioral problems and confusion.    Vital Signs: BP (!) 130/51 (BP Location: Left Arm)   Pulse 84   Temp 98.3 F (36.8 C) (Oral)   Resp 17   SpO2 97%   Physical Exam Vitals and nursing note reviewed.  Constitutional:      General: She is not in acute distress.    Appearance: Normal appearance. She is not ill-appearing.  HENT:     Mouth/Throat:     Mouth: Mucous membranes are moist.     Pharynx: Oropharynx is clear.  Cardiovascular:     Rate and Rhythm: Normal rate and regular rhythm.  Pulmonary:     Effort: Pulmonary effort is normal.     Breath sounds: Normal breath sounds.  Abdominal:     General: Abdomen is flat. There is no distension.      Palpations: Abdomen is soft.     Tenderness: There is abdominal tenderness (generalized, greater in RUQ).  Skin:    General: Skin is warm and dry.  Neurological:     General: No focal deficit present.     Mental Status: She is alert and oriented to person, place, and time. Mental status is at baseline.  Psychiatric:        Mood and Affect: Mood normal.        Behavior: Behavior normal.        Thought Content: Thought content normal.        Judgment: Judgment normal.      MD Evaluation Airway: WNL Heart: WNL Abdomen: WNL Chest/ Lungs: WNL ASA  Classification: 3 Mallampati/Airway Score: Two  Imaging: CT ABDOMEN PELVIS W CONTRAST  Result Date: 07/06/2020 CLINICAL DATA:  Nonlocalized acute abdominal pain. EXAM: CT ABDOMEN AND PELVIS WITH CONTRAST TECHNIQUE: Multidetector CT imaging of the abdomen and pelvis was performed using the standard protocol following bolus administration of intravenous contrast. CONTRAST:  40m OMNIPAQUE IOHEXOL 300 MG/ML  SOLN COMPARISON:  CT abdomen pelvis 06/30/2020 FINDINGS: Lower chest: Trace right pleural effusion. Right lower lobe passive atelectasis. Coronary artery calcifications. Hepatobiliary: No focal liver abnormality is seen. Status post cholecystectomy with interval development of a 7.6 x 6.7 x 7.2 cm gallbladder fossa fluid and gas collection (5:34). Slight peripheral enhancement noted. No biliary dilatation. Pancreas: No focal lesion. Normal pancreatic contour. No surrounding inflammatory changes. No main pancreatic ductal dilatation. Spleen: Normal in size without focal abnormality. Adrenals/Urinary Tract: No adrenal nodule bilaterally. Bilateral kidneys enhance symmetrically. No hydronephrosis. No hydroureter. The urinary bladder is unremarkable. Stomach/Bowel: Stomach is within normal limits. No evidence of bowel wall thickening or dilatation. Redemonstration of a third portion of the duodenum diverticulum (3:35). Fluid density within the  lumen of the large bowel. Appendix appears normal. Vascular/Lymphatic: No abdominal aorta or iliac aneurysm. Mild-to-moderate atherosclerotic plaque of the aorta and its branches. No abdominal, pelvic, or inguinal lymphadenopathy. Reproductive: Multiple coarsely calcified lesions within the uterus likely represents degenerative uterine fibroids. Other: Trace peripancreatic free fluid. No intraperitoneal free gas. Musculoskeletal: No abdominal wall hernia or abnormality. No suspicious lytic or blastic osseous lesions. No acute displaced fracture. Multilevel degenerative changes of the spine. IMPRESSION: 1. Status post cholecystectomy with interval development of a 7.6 x 6.7 cm gallbladder fossa abscess . 2. Trace right pleural effusion. 3. Multiple degenerative uterine fibroids. 4. Fast-incision state. Electronically Signed   By: MIven FinnM.D.   On: 07/06/2020 22:18   CT Abdomen Pelvis W Contrast  Result Date: 06/30/2020 CLINICAL DATA:  Acute abdominal pain EXAM: CT ABDOMEN AND PELVIS WITH CONTRAST TECHNIQUE: Multidetector CT imaging of the abdomen and pelvis was performed using the standard protocol following bolus administration of intravenous contrast. CONTRAST:  1033mOMNIPAQUE IOHEXOL 300 MG/ML  SOLN COMPARISON:  04/09/2012 FINDINGS: LOWER CHEST: Normal. HEPATOBILIARY: Distended gallbladder with cholelithiasis. Mild intrahepatic biliary dilatation . no focal liver abnormality. Common bile duct is normal caliber. PANCREAS: Normal pancreas. No ductal dilatation or peripancreatic fluid collection. SPLEEN: Normal. ADRENALS/URINARY TRACT: The adrenal glands are normal. No hydronephrosis, nephroureterolithiasis or solid renal mass. The urinary bladder is normal for degree of distention STOMACH/BOWEL: No hiatal hernia. There is a diverticulum of the third segment of the duodenum. No small bowel dilatation or inflammation. No focal colonic abnormality. Normal appendix. VASCULAR/LYMPHATIC: There is calcific  atherosclerosis of the abdominal aorta. No lymphadenopathy. REPRODUCTIVE: Multiple calcified uterine fibroids. MUSCULOSKELETAL. Multilevel degenerative disc disease and facet arthrosis. No bony spinal canal stenosis. OTHER: None. IMPRESSION: 1. Distended gallbladder with cholelithiasis and mild intrahepatic biliary dilatation. If there is concern for acute cholecystitis, consider right upper quadrant ultrasound. 2. Fibroid uterus. Aortic atherosclerosis (ICD10-I70.0). Electronically Signed   By: KeUlyses Jarred.D.   On: 06/30/2020 03:21   CT L-SPINE NO CHARGE  Result Date: 06/30/2020 CLINICAL DATA:  Right flank and back pain after fall EXAM: CT LUMBAR SPINE WITHOUT CONTRAST TECHNIQUE: Multidetector CT imaging of the lumbar spine was performed without intravenous contrast administration. Multiplanar CT image reconstructions were also generated. COMPARISON:  None. FINDINGS: Segmentation: 5 lumbar type vertebrae. Alignment: Normal. Vertebrae: No acute fracture or focal pathologic process. Paraspinal and other soft tissues: Calcific aortic atherosclerosis. Uterine fibroids. Disc levels: There is  mild spinal canal stenosis at L4-5. There is severe bilateral L5-S1 neural foraminal stenosis. IMPRESSION: 1. No acute fracture or static subluxation of the lumbar spine. 2. Severe bilateral L5-S1 neural foraminal stenosis. Aortic Atherosclerosis (ICD10-I70.0). Electronically Signed   By: Ulyses Jarred M.D.   On: 06/30/2020 02:57   US Abdomen Limited RUQ (LIVER/GB)  Result Date: 06/30/2020 CLINICAL DATA:  Upper abdominal pain EXAM: ULTRASOUND ABDOMEN LIMITED RIGHT UPPER QUADRANT COMPARISON:  CT abdomen and pelvis Jun 30, 2020 FINDINGS: Gallbladder: Within the gallbladder, there are echogenic foci which move and shadow consistent with cholelithiasis. Largest gallstone measures 9 mm in length. There is gallbladder wall thickening. There is also sludge in the gallbladder. No pericholecystic fluid. Patient is focally tender  over the gallbladder. Common bile duct: Diameter: 2 mm. No evident intrahepatic or extrahepatic biliary duct dilatation. Liver: No focal lesion identified. Liver echogenicity overall increased. Portal vein is patent on color Doppler imaging with normal direction of blood flow towards the liver. Other: None. IMPRESSION: 1. Cholelithiasis and sludge in gallbladder with gallbladder wall thickening and focal tenderness over the gallbladder. These are findings raising concern for acute cholecystitis. 2. Increased liver echogenicity, a finding likely indicative of hepatic steatosis. No focal liver lesions evident. Note that sensitivity of ultrasound for detection of focal liver lesions is diminished in this circumstance. Electronically Signed   By: Lowella Grip III M.D.   On: 06/30/2020 08:17    Labs:  CBC: Recent Labs    07/02/20 0536 07/03/20 0506 07/06/20 1805 07/07/20 0238  WBC 9.4 10.1 11.6* 10.3  HGB 10.9* 10.7* 12.3 10.9*  HCT 35.2* 34.7* 39.1 35.1*  PLT 171 205 381 354    COAGS: No results for input(s): INR, APTT in the last 8760 hours.  BMP: Recent Labs    07/02/20 0536 07/03/20 0506 07/06/20 1805 07/07/20 0238  NA 133* 137 133* 135  K 3.8 3.2* 3.2* 4.1  CL 104 106 97* 103  CO2 '24 25 26 25  ' GLUCOSE 179* 61* 144* 128*  BUN 18 15 7* 8  CALCIUM 8.1* 8.4* 9.1 8.9  CREATININE 0.80 0.66 0.92 1.06*  GFRNONAA >60 >60 >60 53*    LIVER FUNCTION TESTS: Recent Labs    07/02/20 0536 07/03/20 0506 07/06/20 1805 07/07/20 0238  BILITOT 0.8 0.6 0.9 0.7  AST '26 25 16 ' 13*  ALT '26 31 23 19  ' ALKPHOS 48 59 81 70  PROT 5.5* 5.5* 6.5 5.3*  ALBUMIN 2.5* 2.5* 2.9* 2.4*    TUMOR MARKERS: No results for input(s): AFPTM, CEA, CA199, CHROMGRNA in the last 8760 hours.  Assessment and Plan: Gall bladder fossa abscess Patient s/p lap cholecystectomy 5 days ago now with fever, nausea, vomiting, diarrhea.   CT Abdomen Pelvis shows gall bladder fossa abscess.  WBC 10.3 today.  On  Zosyn.  Case reviewed by Dr. Earleen Newport who approves patient for aspiration and drainage today in [place].  NPO.   Risks and benefits discussed with the patient's family including bleeding, infection, damage to adjacent structures, bowel perforation/fistula connection, and sepsis.  All of the patient's questions were answered, patient is agreeable to proceed. Consent signed and in chart.  Thank you for this interesting consult.  I greatly enjoyed meeting KAREL MOWERS and look forward to participating in their care.  A copy of this report was sent to the requesting provider on this date.  Electronically Signed: Docia Barrier, PA 07/07/2020, 9:27 AM   I spent a total of 40 Minutes  in face to face in clinical consultation, greater than 50% of which was counseling/coordinating care for gall bladder fossa abscess.

## 2020-07-07 NOTE — Progress Notes (Signed)
Pharmacy Antibiotic Note  Sandra Washington is a 80 y.o. female admitted on 07/06/2020 with intra-abdominal infection.  Pharmacy has been consulted for Zosyn dosing. WBC mildly elevated. Renal function good.   Plan: Zosyn 3.375G IV q8h to be infused over 4 hours Trend WBC, temp, renal function  F/U infectious work-up  Temp (24hrs), Avg:99.4 F (37.4 C), Min:99.4 F (37.4 C), Max:99.4 F (37.4 C)  Recent Labs  Lab 07/01/20 0611 07/02/20 0536 07/03/20 0506 07/06/20 1805  WBC 19.6* 9.4 10.1 11.6*  CREATININE 0.84 0.80 0.66 0.92    Estimated Creatinine Clearance: 53.3 mL/min (by C-G formula based on SCr of 0.92 mg/dL).    Narda Bonds, PharmD, BCPS Clinical Pharmacist Phone: [phone]

## 2020-07-07 NOTE — ED Notes (Signed)
Received verbal report from Daija S. RN at this time 

## 2020-07-08 DIAGNOSIS — E785 Hyperlipidemia, unspecified: Secondary | ICD-10-CM | POA: Diagnosis not present

## 2020-07-08 DIAGNOSIS — K651 Peritoneal abscess: Secondary | ICD-10-CM | POA: Diagnosis not present

## 2020-07-08 DIAGNOSIS — E1169 Type 2 diabetes mellitus with other specified complication: Secondary | ICD-10-CM | POA: Diagnosis not present

## 2020-07-08 DIAGNOSIS — I10 Essential (primary) hypertension: Secondary | ICD-10-CM | POA: Diagnosis not present

## 2020-07-08 LAB — COMPREHENSIVE METABOLIC PANEL
ALT: 16 U/L (ref 0–44)
AST: 14 U/L — ABNORMAL LOW (ref 15–41)
Albumin: 2.3 g/dL — ABNORMAL LOW (ref 3.5–5.0)
Alkaline Phosphatase: 61 U/L (ref 38–126)
Anion gap: 9 (ref 5–15)
BUN: 7 mg/dL — ABNORMAL LOW (ref 8–23)
CO2: 24 mmol/L (ref 22–32)
Calcium: 8.7 mg/dL — ABNORMAL LOW (ref 8.9–10.3)
Chloride: 102 mmol/L (ref 98–111)
Creatinine, Ser: 0.78 mg/dL (ref 0.44–1.00)
GFR, Estimated: >60 mL/min (ref >60)
Glucose, Bld: 151 mg/dL — ABNORMAL HIGH (ref 70–99)
Potassium: 3.9 mmol/L (ref 3.5–5.1)
Sodium: 135 mmol/L (ref 135–145)
Total Bilirubin: 0.5 mg/dL (ref 0.3–1.2)
Total Protein: 5.1 g/dL — ABNORMAL LOW (ref 6.5–8.1)

## 2020-07-08 LAB — CBC
HCT: 33.3 % — ABNORMAL LOW (ref 36.0–46.0)
Hemoglobin: 10.4 g/dL — ABNORMAL LOW (ref 12.0–15.0)
MCH: 26.9 pg (ref 26.0–34.0)
MCHC: 31.2 g/dL (ref 30.0–36.0)
MCV: 86 fL (ref 80.0–100.0)
Platelets: 376 10*3/uL (ref 150–400)
RBC: 3.87 MIL/uL (ref 3.87–5.11)
RDW: 14.1 % (ref 11.5–15.5)
WBC: 8.8 10*3/uL (ref 4.0–10.5)
nRBC: 0 % (ref 0.0–0.2)

## 2020-07-08 LAB — GLUCOSE, CAPILLARY
Glucose-Capillary: 106 mg/dL — ABNORMAL HIGH (ref 70–99)
Glucose-Capillary: 202 mg/dL — ABNORMAL HIGH (ref 70–99)
Glucose-Capillary: 210 mg/dL — ABNORMAL HIGH (ref 70–99)
Glucose-Capillary: 202 mg/dL — ABNORMAL HIGH (ref 70–99)

## 2020-07-08 NOTE — Evaluation (Signed)
Physical Therapy Evaluation Patient Details Name: Sandra Washington MRN: [id number] DOB: 07/27/1940 Today's Date: 07/08/2020   History of Present Illness  Pt is an 80 yo female presenting 5/10 from PCP due to continued abdominal pain, nausea, vomiting, and weakness after gallbladder removal on 5/5.  Upon workup, pt found to have gallbladder fossa abcess, started on antibiotics. s/p drain placement 5/11. PMH includes: CKD, DM II, HTN, and stroke.    Clinical Impression  Pt in bed upon arrival of PT, agreeable to evaluation at this time. Prior to original surgery, the pt was completely independent, however, since last discharge she reports she has been mostly bed bound, transferring to make-shift [facility] with assistance of family as needed, but not doing other transfers or mobility. The pt now presents with limitations in functional mobility, strength, power, endurance, and dynamic stability due to above dx, and will continue to benefit from skilled PT to address these deficits. The pt was able to complete bed mobility and transfers with minA to minG for safety with use of RW. However, the pt demos significant deficits in endurance and activity tolerance at this time, requiring increased time and effort for each step, and was limited to 15 ft ambulation in her room. The pt will continue to benefit from skilled PT to progress deficits acutely, as well as HHPT to facilitate return to independence with mobility in the home following d/c.      Follow Up Recommendations Home health PT;Supervision for mobility/OOB    Equipment Recommendations  Rolling walker with 5" wheels;3in1 (PT)    Recommendations for Other Services       Precautions / Restrictions Precautions Precautions: Fall Restrictions Weight Bearing Restrictions: No      Mobility  Bed Mobility Overal bed mobility: Needs Assistance Bed Mobility: Rolling;Sidelying to Sit Rolling: Min assist Sidelying to sit: Min assist;HOB  elevated       General bed mobility comments: attempted to use log roll for abdominal comfort, pt needing increased cues and minA to complete transition    Transfers Overall transfer level: Needs assistance Equipment used: Rolling walker (2 wheeled) Transfers: Sit to/from Stand Sit to Stand: Min guard;From elevated surface         General transfer comment: pt able to power up from elevated surface without assist, no instability with initial stand  Ambulation/Gait Ambulation/Gait assistance: Supervision Gait Distance (Feet): 15 Feet Assistive device: Rolling walker (2 wheeled) Gait Pattern/deviations: Decreased stride length;Shuffle;Step-to pattern Gait velocity: decreased Gait velocity interpretation: <1.31 ft/sec, indicative of household ambulator General Gait Details: pt with short strides, minimal step clearance, increased time between each step. no overt LOB or instability with BUE support, but significantly slowed gait.     Balance Overall balance assessment: Mild deficits observed, not formally tested                                           Pertinent Vitals/Pain Pain Assessment: 0-10 Pain Score: 5  Pain Location: R abdomen, near drain Pain Descriptors / Indicators: Crying;Grimacing Pain Intervention(s): Limited activity within patient's tolerance;Monitored during session;Repositioned    Home Living Family/patient expects to be discharged to:: Private residence Living Arrangements: Alone Available Help at Discharge: Family;Available PRN/intermittently Type of Home: House Home Access: Stairs to enter Entrance Stairs-Rails: None Entrance Stairs-Number of Steps: front 7 steps; 1 in back Home Layout: One level Home Equipment: Grab bars - tub/shower;Shower seat  Prior Function Level of Independence: Needs assistance   Gait / Transfers Assistance Needed: following surgery, pt required assist for all mobility, limited to short distances of  walking in her room  ADL's / Homemaking Assistance Needed: all IADLs completed by children after initial surgery, assist from children  Comments: Independent prior to gallbladder sx; pt was assisted with family with sink baths after sx, but mostly independent, but then not able to keep food down.     Hand Dominance   Dominant Hand: Right    Extremity/Trunk Assessment   Upper Extremity Assessment Upper Extremity Assessment: Defer to OT evaluation;Overall WFL for tasks assessed    Lower Extremity Assessment Lower Extremity Assessment: Generalized weakness (limited endurance, grossly equal bilaterally)    Cervical / Trunk Assessment Cervical / Trunk Assessment: Normal  Communication   Communication: No difficulties  Cognition Arousal/Alertness: Awake/alert Behavior During Therapy: WFL for tasks assessed/performed Overall Cognitive Status: Within Functional Limits for tasks assessed                                        General Comments General comments (skin integrity, edema, etc.): VSS, no nausea this session.    Exercises     Assessment/Plan    PT Assessment Patient needs continued PT services  PT Problem List Decreased strength;Decreased activity tolerance;Decreased balance;Decreased mobility;Decreased safety awareness       PT Treatment Interventions DME instruction;Gait training;Stair training;Functional mobility training;Therapeutic activities;Therapeutic exercise;Balance training;Patient/family education    PT Goals (Current goals can be found in the Care Plan section)  Acute Rehab PT Goals Patient Stated Goal: to go home PT Goal Formulation: With patient Time For Goal Achievement: 07/22/20 Potential to Achieve Goals: Good    Frequency Min 4X/week    AM-PAC PT "6 Clicks" Mobility  Outcome Measure Help needed turning from your back to your side while in a flat bed without using bedrails?: A Little Help needed moving from lying on your  back to sitting on the side of a flat bed without using bedrails?: A Little Help needed moving to and from a bed to a chair (including a wheelchair)?: A Little Help needed standing up from a chair using your arms (e.g., wheelchair or bedside chair)?: A Little Help needed to walk in hospital room?: A Little Help needed climbing 3-5 steps with a railing? : A Little 6 Click Score: 18    End of Session Equipment Utilized During Treatment: Gait belt Activity Tolerance: Patient tolerated treatment well Patient left: in chair;with call bell/phone within reach;with chair alarm set Nurse Communication: Mobility status PT Visit Diagnosis: Other abnormalities of gait and mobility (R26.89);Muscle weakness (generalized) (M62.81);Pain Pain - Right/Left: Right Pain - part of body:  (abdomen)    Time: 0973-5329 PT Time Calculation (min) (ACUTE ONLY): 28 min   Charges:   PT Evaluation $PT Eval Low Complexity: 1 Low PT Treatments $Gait Training: 8-22 mins        Karma Ganja, PT, DPT   Acute Rehabilitation Department Pager #: [phone]  Otho Bellows 07/08/2020, 11:28 AM

## 2020-07-08 NOTE — Progress Notes (Addendum)
Subjective: CC: abdominal pain Feeling better after IR drain placement. IR drain placed yesterday yielding 175 cc of bilious appearing fluid. Drain SS today. HIDA negative for leak. She is still having some soreness of the RUQ but feels pain medication control this. Tolerating solid diet and finished most of her breakfast without increased pain, n/v. She has not been oob since IR procedure yesterday. She lives at home alone.   Objective: Vital signs in last 24 hours: Temp:  [98.5 F (36.9 C)-99 F (37.2 C)] 98.5 F (36.9 C) (05/12 0813) Pulse Rate:  [71-90] 71 (05/12 0813) Resp:  [16-22] 17 (05/12 0813) BP: (126-142)/(47-59) 137/47 (05/12 0813) SpO2:  [94 %-100 %] 94 % (05/12 0813) Last BM Date: 07/07/20  Intake/Output from previous day: 05/11 0701 - 05/12 0700 In: 984.5 [P.O.:240; I.V.:639.5; IV Piggyback:105] Out: 190 [Urine:150; Drains:40] Intake/Output this shift: No intake/output data recorded.  PE: Gen:  Alert, NAD, pleasant, sitting up in bed eating breakfast Card:  Reg Pulm:  Normal rate and effort  Abd: Soft, ND, mild tenderness of the RUQ without peritonitis, +BS, Incisions with glue intact appears well and are without drainage, bleeding, or signs of infection, IR drain w/ gravity bag in place w/ scant SS output Ext:  No LE edema  Psych: A&Ox3  Skin: no rashes noted, warm and dry   Lab Results:  Recent Labs    07/07/20 0238 07/08/20 0128  WBC 10.3 8.8  HGB 10.9* 10.4*  HCT 35.1* 33.3*  PLT 354 376   BMET Recent Labs    07/07/20 0238 07/08/20 0128  NA 135 135  K 4.1 3.9  CL 103 102  CO2 25 24  GLUCOSE 128* 151*  BUN 8 7*  CREATININE 1.06* 0.78  CALCIUM 8.9 8.7*   PT/INR Recent Labs    07/07/20 1336  LABPROT 13.9  INR 1.1   CMP     Component Value Date/Time   NA 135 07/08/2020 0128   K 3.9 07/08/2020 0128   CL 102 07/08/2020 0128   CO2 24 07/08/2020 0128   GLUCOSE 151 (H) 07/08/2020 0128   BUN 7 (L) 07/08/2020 0128    CREATININE 0.78 07/08/2020 0128   CALCIUM 8.7 (L) 07/08/2020 0128   PROT 5.1 (L) 07/08/2020 0128   ALBUMIN 2.3 (L) 07/08/2020 0128   AST 14 (L) 07/08/2020 0128   ALT 16 07/08/2020 0128   ALKPHOS 61 07/08/2020 0128   BILITOT 0.5 07/08/2020 0128   GFRNONAA >60 07/08/2020 0128   GFRAA >90 04/09/2012 1509   Lipase     Component Value Date/Time   LIPASE 43 07/06/2020 1805       Studies/Results: CT ABDOMEN PELVIS W CONTRAST  Result Date: 07/06/2020 CLINICAL DATA:  Nonlocalized acute abdominal pain. EXAM: CT ABDOMEN AND PELVIS WITH CONTRAST TECHNIQUE: Multidetector CT imaging of the abdomen and pelvis was performed using the standard protocol following bolus administration of intravenous contrast. CONTRAST:  32mL OMNIPAQUE IOHEXOL 300 MG/ML  SOLN COMPARISON:  CT abdomen pelvis 06/30/2020 FINDINGS: Lower chest: Trace right pleural effusion. Right lower lobe passive atelectasis. Coronary artery calcifications. Hepatobiliary: No focal liver abnormality is seen. Status post cholecystectomy with interval development of a 7.6 x 6.7 x 7.2 cm gallbladder fossa fluid and gas collection (5:34). Slight peripheral enhancement noted. No biliary dilatation. Pancreas: No focal lesion. Normal pancreatic contour. No surrounding inflammatory changes. No main pancreatic ductal dilatation. Spleen: Normal in size without focal abnormality. Adrenals/Urinary Tract: No adrenal nodule bilaterally. Bilateral kidneys enhance  symmetrically. No hydronephrosis. No hydroureter. The urinary bladder is unremarkable. Stomach/Bowel: Stomach is within normal limits. No evidence of bowel wall thickening or dilatation. Redemonstration of a third portion of the duodenum diverticulum (3:35). Fluid density within the lumen of the large bowel. Appendix appears normal. Vascular/Lymphatic: No abdominal aorta or iliac aneurysm. Mild-to-moderate atherosclerotic plaque of the aorta and its branches. No abdominal, pelvic, or inguinal  lymphadenopathy. Reproductive: Multiple coarsely calcified lesions within the uterus likely represents degenerative uterine fibroids. Other: Trace peripancreatic free fluid. No intraperitoneal free gas. Musculoskeletal: No abdominal wall hernia or abnormality. No suspicious lytic or blastic osseous lesions. No acute displaced fracture. Multilevel degenerative changes of the spine. IMPRESSION: 1. Status post cholecystectomy with interval development of a 7.6 x 6.7 cm gallbladder fossa abscess . 2. Trace right pleural effusion. 3. Multiple degenerative uterine fibroids. 4. Fast-incision state. Electronically Signed   By: Iven Finn M.D.   On: 07/06/2020 22:18   CT IMAGE GUIDED FLUID DRAIN BY CATHETER  Result Date: 07/07/2020 INDICATION: History of cholecystectomy performed on 05/05 by Dr. Constance Haw, now with fluid collection within the gallbladder fossa worrisome for abscess and/or bile leak. Please perform image guided drainage catheter placement for infection source control purposes. EXAM: ULTRASOUND AND CT GUIDED DRAINAGE CATHETER PLACEMENT INTO GALLBLADDER FOSSA FLUID COLLECTION COMPARISON:  CT abdomen pelvis-07/06/2020; nuclear medicine HIDA scan-earlier same day MEDICATIONS: The patient is currently admitted to the hospital and receiving intravenous antibiotics. The antibiotics were administered within an appropriate time frame prior to the initiation of the procedure. ANESTHESIA/SEDATION: Moderate (conscious) sedation was employed during this procedure. A total of Versed 3 mg and Fentanyl 100 mcg was administered intravenously. Moderate Sedation Time: 20 minutes. The patient's level of consciousness and vital signs were monitored continuously by radiology nursing throughout the procedure under my direct supervision. CONTRAST:  None COMPLICATIONS: None immediate. PROCEDURE: Informed written consent was obtained from the patient after a discussion of the risks, benefits and alternatives to treatment. The  patient was placed supine on the CT gantry and a pre procedural CT was performed re-demonstrating the known abscess/fluid collection within the gallbladder fossa with dominant component measuring approximately 8.0 x 6.5 cm (image 38, series 2). The table position was marked and the complex mixed echogenic fluid collection was identified sonographically. The procedure was planned. A timeout was performed prior to the initiation of the procedure. The skin overlying the right upper abdominal quadrant was prepped and draped in the usual sterile fashion. After the overlying soft tissues were anesthetized with 1% lidocaine with epinephrine, an 18 gauge trocar needle was utilized to access the indeterminate fluid collection in the gallbladder fossa under direct ultrasound guidance using a transhepatic approach. Multiple ultrasound images were saved procedural documentation purposes. Appropriate positioning was confirmed with CT imaging. Next, the track was dilated allowing placement of a 12 French percutaneous catheter with end coiled and locked within the medial aspect of the indeterminate fluid collection. Next, approximately 175 cc of bilious appearing fluid was aspirated and postprocedural imaging was obtained The drainage catheter was connected to a gravity bag and secured in place within interrupted suture and a Stat Lock device. A dressing was applied. The patient tolerated the procedure well without immediate postprocedural complication. IMPRESSION: Successful ultrasound and CT guided placement of a 12 French all purpose drain catheter into the gallbladder fossa fluid collection with aspiration of 175 cc of bilious appearing fluid. Samples were sent to the laboratory as requested by the ordering clinical team. Electronically Signed   By: Jenny Reichmann  Watts M.D.   On: 07/07/2020 16:33   NM HEPATOBILIARY LEAK (POST-SURGICAL)  Result Date: 07/07/2020 CLINICAL DATA:  Evaluate for bile leak status post cholecystectomy.  EXAM: NUCLEAR MEDICINE HEPATOBILIARY IMAGING TECHNIQUE: Sequential images of the abdomen were obtained out to 60 minutes following intravenous administration of radiopharmaceutical. RADIOPHARMACEUTICALS:  5.4 mCi Tc-60m  Choletec IV COMPARISON:  CT AP 07/06/2020 FINDINGS: Prompt uptake and biliary excretion of activity by the liver is seen. Biliary activity passes into small bowel, consistent with patent common bile duct. No abnormal radiotracer accumulation identified to suggest bile leak. IMPRESSION: 1. No signs of bile leak. 2. Patent common bile duct with normal biliary to bowel transit. Electronically Signed   By: Kerby Moors M.D.   On: 07/07/2020 13:22    Anti-infectives: Anti-infectives (From admission, onward)   Start     Dose/Rate Route Frequency Ordered Stop   07/07/20 0600  piperacillin-tazobactam (ZOSYN) IVPB 3.375 g        3.375 g 12.5 mL/hr over 240 Minutes Intravenous Every 8 hours 07/07/20 0152     07/06/20 2230  piperacillin-tazobactam (ZOSYN) IVPB 3.375 g        3.375 g 100 mL/hr over 30 Minutes Intravenous  Once 07/06/20 2225 07/07/20 0002       Assessment/Plan HTN HLD DM2 GERD  GB fossa fluid collection Hx of Laparoscopic cholecystectomy for Gangrenous cholecystitis by Dr. Constance Haw at [place] on 5/5 - CT 5/10 w/ 7.6 x 6.7 cm gallbladder fossa abscess . - S/p IR drainage 5/11 yielding 175 cc of bilious appearing fluid. Drain SS today. Cx pending.  - HIDA negative for leak. LFT's wnl.  - Cont IV abx. WBC wnl - Mobilize. PT - We will follow with you. Discussed with TRH in person  FEN - Soft, IVF per TRH VTE - SCDs, lovenox  ID - [email or web address]    LOS: 2 days    Jillyn Ledger , [facility] 07/08/2020, 8:56 AM Please see Amion for pager number during day hours 7:00am-4:30pm

## 2020-07-08 NOTE — TOC Initial Note (Signed)
Transition of Care [facility]) - Initial/Assessment Note    Patient Details  Name: Sandra [place] MRN: [id number] Date of Birth: 1940-05-27  Transition of Care [facility]) CM/SW Contact:    Milinda Antis, [place] Phone Number: 07/08/2020, 1:33 PM  Clinical Narrative:                 CSW received consult for home health at d/c.  CSW spoke with patient. Patient reported that she lives alone, but has children and other family who live nearby and can assist.  Patient did not have an agency preference.    CSW verified the patient's address, phone number, and PCP.  The patient did not report any concerns with affording medications.    CSW discussed insurance authorization process and provided Medicare ratings list. Patient has received the COVID vaccines. Patient expressed being hopeful  to feel better soon. No further questions reported at this time.   CSW spoke with Marzetta Board from [facility] and they have accepted the patient for home health.  Expected Discharge Plan: [place] Barriers to Discharge: Continued Medical Work up   Patient Goals and CMS Choice Patient states their goals for this hospitalization and ongoing recovery are:: To get back to her home CMS Medicare.gov Compare Post Acute Care list provided to:: Patient Choice offered to / list presented to : Patient  Expected Discharge Plan and Services Expected Discharge Plan: [place]       Living arrangements for the past 2 months: Single Family Home                           HH Arranged: RN,PT [place] Agency: [facility] (formerly [employer]) Date [place]: 07/08/20 Time [place]: 1332 Representative spoke with at [place]: [place] Arrangements/Services Living arrangements for the past 2 months: [place] with:: Self Patient language and need for interpreter reviewed:: Yes Do you feel safe going back to the place where you  live?: Yes      Need for Family Participation in Patient Care: Yes (Comment) Care giver support system in place?: Yes (comment)   Criminal Activity/Legal Involvement Pertinent to Current Situation/Hospitalization: No - Comment as needed  Activities of Daily Living Home Assistive Devices/Equipment: None ADL Screening (condition at time of admission) Patient's cognitive ability adequate to safely complete daily activities?: Yes Is the patient deaf or have difficulty hearing?: No Does the patient have difficulty seeing, even when wearing glasses/contacts?: No Does the patient have difficulty concentrating, remembering, or making decisions?: No Patient able to express need for assistance with ADLs?: Yes Does the patient have difficulty dressing or bathing?: No Independently performs ADLs?: Yes (appropriate for developmental age) Does the patient have difficulty walking or climbing stairs?: No Weakness of Legs: Both Weakness of Arms/Hands: None  Permission Sought/Granted   Permission granted to share information with : Yes, Verbal Permission Granted     Permission granted to share info w AGENCY: Home health agencies        Emotional Assessment Appearance:: Appears stated age Attitude/Demeanor/Rapport: Engaged Affect (typically observed): Accepting Orientation: : Oriented to Self,Oriented to Place,Oriented to  Time,Oriented to Situation Alcohol / Substance Use: Not Applicable Psych Involvement: No (comment)  Admission diagnosis:  Intra-abdominal abscess ([place]) [K65.1] Post-operative wound abscess [T81.49XA] Patient Active Problem List   Diagnosis Date Noted  . Type 2 diabetes mellitus with hyperlipidemia ([place]) 07/07/2020  .  Intra-abdominal abscess ([place]) 07/06/2020  . Acute gangrenous cholecystitis 07/01/2020  . Gangrenous cholecystitis 06/30/2020  . Background diabetic retinopathy ([place]) 01/15/2020  . Hypertension, essential 10/30/2014  . Hypercholesterolemia 11/06/2012  .  Unspecified essential hypertension 11/06/2012  . Type II or unspecified type diabetes mellitus without mention of complication, uncontrolled 10/23/2012   PCP:  Lucianne Lei, MD Pharmacy:   [facility] [address], [place] - [place] [address] [place] [address] [place] [address] Phone: [phone] Fax: [phone]     Social Determinants of Health (SDOH) Interventions    Readmission Risk Interventions No flowsheet data found.

## 2020-07-08 NOTE — Progress Notes (Signed)
PROGRESS NOTE    Sandra Washington  [id number] DOB: [birth year] DOA: 07/06/2020 PCP: Lucianne Lei, MD   Brief Narrative: Sandra Washington is a 80 y.o. female with history of hypertension, hyperlipidemia, diabetes mellitus type 2, GERD.  Patient presents secondary to right upper quadrant pain, nausea, vomiting and found to have a large intra-abdominal abscess located in her gallbladder fossa.  General surgery and interventional radiology consulted on admission.  Plan for percutaneous drain placement.  IV antibiotics started on admission.   Assessment & Plan:   Principal Problem:   Intra-abdominal abscess ([place]) Active Problems:   Hypertension, essential   Type 2 diabetes mellitus with hyperlipidemia ([facility])   Intra-abdominal abscess vs bile leak CT abdomen/pelvis suggests large abscess related to recent laparoscopic cholecystectomy. General surgery consulted on admission along with IR. Zosyn IV initiated empirically. Blood cultures obtained on admission.  Drain placed by IR on 5/11 with initial description of bilious fluid.  HIDA scan without suggestion of bile leak. -Continue Zosyn IV; follow-up blood cultures -General surgery recommendations: Signed off  Intractable nausea/vomiting/diarrhea Symptoms appear to be improved. IV fluids started.  Improved. -Continue compazine prn -Advance diet as tolerated  Diabetes mellitus, type 2 Last hemoglobin A1c of 6.4% from March 2022.  Patient is managed as an outpatient on insulin NPH 8 units in a.m. and 12 units in the evening in addition to insulin regular 8 units with breakfast and lunch in addition to 16 units with dinner.  On admission, patient started on sliding scale. -Continue Levemir 8 units twice daily while inpatient and continue sliding scale insulin  Primary hypertension Patient is on valsartan hydrochlorothiazide as an outpatient.  Blood pressure is slightly uncontrolled.  Patient was started on amlodipine 5 mg  while inpatient. -Continue amlodipine for now but will transition to valsartan-hydrochlorothiazide once creatinine/fluid status has stabilized.  GERD -Continue Protonix   DVT prophylaxis: SCDs Code Status:   Code Status: Full Code Family Communication: None at bedside Disposition Plan: Anticipate discharge likely home in 1 to 3 days pending PT recommendations, culture data   Consultants:   General surgery  Interventional radiology  Procedures:   CT GUIDED PLACEMENT OF A 12 FR DRAIN CATHETER (07/07/2020)  Antimicrobials:  Zosyn IV   Subjective: Abdominal pain still present but much improved from yesterday.  No other issues overnight.  Objective: Vitals:   07/07/20 2016 07/08/20 0535 07/08/20 0813 07/08/20 1315  BP: (!) 135/49 (!) 141/52 (!) 137/47 (!) 123/53  Pulse: 84 79 71 75  Resp: 17 16 17 17   Temp: 98.7 F (37.1 C) 99 F (37.2 C) 98.5 F (36.9 C) 98.4 F (36.9 C)  TempSrc: Oral Oral Oral Oral  SpO2: 97% 99% 94% 98%    Intake/Output Summary (Last 24 hours) at 07/08/2020 1411 Last data filed at 07/08/2020 1000 Gross per 24 hour  Intake 703.34 ml  Output 641 ml  Net 62.34 ml   There were no vitals filed for this visit.  Examination:  General exam: Appears calm and comfortable Respiratory system: Clear to auscultation. Respiratory effort normal. Cardiovascular system: S1 & S2 heard, RRR. No murmurs, rubs, gallops or clicks. Gastrointestinal system: Abdomen is nondistended, soft and mildly tender in right upper quadrant. No organomegaly or masses felt. Normal bowel sounds heard. Central nervous system: Alert and oriented. No focal neurological deficits. Musculoskeletal: No edema. No calf tenderness Skin: No cyanosis. No rashes Psychiatry: Judgement and insight appear normal. Mood & affect appropriate.    Data Reviewed: I have personally reviewed following  labs and imaging studies  CBC Lab Results  Component Value Date   WBC 8.8 07/08/2020   RBC  3.87 07/08/2020   HGB 10.4 (L) 07/08/2020   HCT 33.3 (L) 07/08/2020   MCV 86.0 07/08/2020   MCH 26.9 07/08/2020   PLT 376 07/08/2020   MCHC 31.2 07/08/2020   RDW 14.1 07/08/2020   LYMPHSABS 2.4 07/06/2020   MONOABS 1.2 (H) 07/06/2020   EOSABS 0.1 07/06/2020   BASOSABS 0.1 08/23/9483     Last metabolic panel Lab Results  Component Value Date   NA 135 07/08/2020   K 3.9 07/08/2020   CL 102 07/08/2020   CO2 24 07/08/2020   BUN 7 (L) 07/08/2020   CREATININE 0.78 07/08/2020   GLUCOSE 151 (H) 07/08/2020   GFRNONAA >60 07/08/2020   GFRAA >90 04/09/2012   CALCIUM 8.7 (L) 07/08/2020   PHOS 3.8 07/07/2020   PROT 5.1 (L) 07/08/2020   ALBUMIN 2.3 (L) 07/08/2020   BILITOT 0.5 07/08/2020   ALKPHOS 61 07/08/2020   AST 14 (L) 07/08/2020   ALT 16 07/08/2020   ANIONGAP 9 07/08/2020    CBG (last 3)  Recent Labs    07/07/20 2104 07/08/20 0640 07/08/20 1111  GLUCAP 206* 106* 202*     GFR: Estimated Creatinine Clearance: 61.3 mL/min (by C-G formula based on SCr of 0.78 mg/dL).  Coagulation Profile: Recent Labs  Lab 07/07/20 1336  INR 1.1    Recent Results (from the past 240 hour(s))  SARS CORONAVIRUS 2 (TAT 6-24 HRS) Nasopharyngeal Nasopharyngeal Swab     Status: None   Collection Time: 06/30/20  5:50 AM   Specimen: Nasopharyngeal Swab  Result Value Ref Range Status   SARS Coronavirus 2 NEGATIVE NEGATIVE Final    Comment: (NOTE) SARS-CoV-2 target nucleic acids are NOT DETECTED.  The SARS-CoV-2 RNA is generally detectable in upper and lower respiratory specimens during the acute phase of infection. Negative results do not preclude SARS-CoV-2 infection, do not rule out co-infections with other pathogens, and should not be used as the sole basis for treatment or other patient management decisions. Negative results must be combined with clinical observations, patient history, and epidemiological information. The expected result is Negative.  Fact Sheet for  Patients: SugarRoll.be  Fact Sheet for Healthcare Providers: https://www.woods-mathews.com/  This test is not yet approved or cleared by the [place] FDA and  has been authorized for detection and/or diagnosis of SARS-CoV-2 by FDA under an Emergency Use Authorization (EUA). This EUA will remain  in effect (meaning this test can be used) for the duration of the COVID-19 declaration under Se ction 564(b)(1) of the Act, 21 U.S.C. section 360bbb-3(b)(1), unless the authorization is terminated or revoked sooner.  Performed at [facility], [place] [address] [place], [place] [address]   MRSA PCR Screening     Status: None   Collection Time: 07/01/20  2:57 AM   Specimen: Nasal Mucosa; Nasopharyngeal  Result Value Ref Range Status   MRSA by PCR NEGATIVE NEGATIVE Final    Comment:        The GeneXpert MRSA Assay (FDA approved for NASAL specimens only), is one component of a comprehensive MRSA colonization surveillance program. It is not intended to diagnose MRSA infection nor to guide or monitor treatment for MRSA infections. Performed at [facility], [address], [place],  [address]   Culture, blood (routine x 2)     Status: Abnormal   Collection Time: 07/06/20 12:33 AM   Specimen: BLOOD  Result Value Ref Range Status   Specimen Description BLOOD SITE NOT SPECIFIED  Final   Special Requests   Final    BOTTLES DRAWN AEROBIC AND ANAEROBIC Blood Culture adequate volume   Culture  Setup Time   Final    GRAM POSITIVE RODS AEROBIC BOTTLE ONLY CRITICAL RESULT CALLED TO, READ BACK BY AND VERIFIED WITH: PHARM D M.BITONTI ON [id number] AT [id number] BY E.PARRISH    Culture (A)  Final    BACILLUS SPECIES Standardized susceptibility testing for this organism is not available. Performed at [facility], [place] [address] [place], [place] [address]    Report Status 07/08/2020 FINAL  Final  Resp Panel by RT-PCR (Flu A&B, Covid)  Nasopharyngeal Swab     Status: None   Collection Time: 07/06/20 11:53 PM   Specimen: Nasopharyngeal Swab; Nasopharyngeal(NP) swabs in vial transport medium  Result Value Ref Range Status   SARS Coronavirus 2 by RT PCR NEGATIVE NEGATIVE Final    Comment: (NOTE) SARS-CoV-2 target nucleic acids are NOT DETECTED.  The SARS-CoV-2 RNA is generally detectable in upper respiratory specimens during the acute phase of infection. The lowest concentration of SARS-CoV-2 viral copies this assay can detect is 138 copies/mL. A negative result does not preclude SARS-Cov-2 infection and should not be used as the sole basis for treatment or other patient management decisions. A negative result may occur with  improper specimen collection/handling, submission of specimen other than nasopharyngeal swab, presence of viral mutation(s) within the areas targeted by this assay, and inadequate number of viral copies(<138 copies/mL). A negative result must be combined with clinical observations, patient history, and epidemiological information. The expected result is Negative.  Fact Sheet for Patients:  EntrepreneurPulse.com.au  Fact Sheet for Healthcare Providers:  IncredibleEmployment.be  This test is no t yet approved or cleared by the [place] FDA and  has been authorized for detection and/or diagnosis of SARS-CoV-2 by FDA under an Emergency Use Authorization (EUA). This EUA will remain  in effect (meaning this test can be used) for the duration of the COVID-19 declaration under Section 564(b)(1) of the Act, 21 U.S.C.section 360bbb-3(b)(1), unless the authorization is terminated  or revoked sooner.       Influenza A by PCR NEGATIVE NEGATIVE Final   Influenza B by PCR NEGATIVE NEGATIVE Final    Comment: (NOTE) The Xpert Xpress SARS-CoV-2/FLU/RSV plus assay is intended as an aid in the diagnosis of influenza from Nasopharyngeal swab specimens and should not be  used as a sole basis for treatment. Nasal washings and aspirates are unacceptable for Xpert Xpress SARS-CoV-2/FLU/RSV testing.  Fact Sheet for Patients: EntrepreneurPulse.com.au  Fact Sheet for Healthcare Providers: IncredibleEmployment.be  This test is not yet approved or cleared by the [place] FDA and has been authorized for detection and/or diagnosis of SARS-CoV-2 by FDA under an Emergency Use Authorization (EUA). This EUA will remain in effect (meaning this test can be used) for the duration of the COVID-19 declaration under Section 564(b)(1) of the Act, 21 U.S.C. section 360bbb-3(b)(1), unless the authorization is terminated or revoked.  Performed at [facility], [place] [address], [place], [place] [address]   Culture, blood (routine x 2)     Status: None (Preliminary result)   Collection Time: 07/07/20  4:26 AM   Specimen: BLOOD RIGHT HAND  Result Value Ref Range Status   Specimen Description BLOOD RIGHT HAND  Final   Special Requests   Final    BOTTLES DRAWN AEROBIC AND ANAEROBIC Blood Culture  adequate volume   Culture   Final    NO GROWTH 1 DAY Performed at [facility], [place] [address], [place], [place] [address]    Report Status PENDING  Incomplete  Aerobic/Anaerobic Culture (surgical/deep wound)     Status: None (Preliminary result)   Collection Time: 07/07/20  3:57 PM   Specimen: Abscess  Result Value Ref Range Status   Specimen Description ABSCESS  Final   Special Requests DRAIN  Final   Gram Stain   Final    FEW WBC PRESENT,BOTH PMN AND MONONUCLEAR NO ORGANISMS SEEN    Culture   Final    NO GROWTH < 24 HOURS Performed at [facility], [place] [address] [place], [place] [address]    Report Status PENDING  Incomplete        Radiology Studies: CT ABDOMEN PELVIS W CONTRAST  Result Date: 07/06/2020 CLINICAL DATA:  Nonlocalized acute abdominal pain. EXAM: CT ABDOMEN AND PELVIS WITH CONTRAST  TECHNIQUE: Multidetector CT imaging of the abdomen and pelvis was performed using the standard protocol following bolus administration of intravenous contrast. CONTRAST:  65mL OMNIPAQUE IOHEXOL 300 MG/ML  SOLN COMPARISON:  CT abdomen pelvis 06/30/2020 FINDINGS: Lower chest: Trace right pleural effusion. Right lower lobe passive atelectasis. Coronary artery calcifications. Hepatobiliary: No focal liver abnormality is seen. Status post cholecystectomy with interval development of a 7.6 x 6.7 x 7.2 cm gallbladder fossa fluid and gas collection (5:34). Slight peripheral enhancement noted. No biliary dilatation. Pancreas: No focal lesion. Normal pancreatic contour. No surrounding inflammatory changes. No main pancreatic ductal dilatation. Spleen: Normal in size without focal abnormality. Adrenals/Urinary Tract: No adrenal nodule bilaterally. Bilateral kidneys enhance symmetrically. No hydronephrosis. No hydroureter. The urinary bladder is unremarkable. Stomach/Bowel: Stomach is within normal limits. No evidence of bowel wall thickening or dilatation. Redemonstration of a third portion of the duodenum diverticulum (3:35). Fluid density within the lumen of the large bowel. Appendix appears normal. Vascular/Lymphatic: No abdominal aorta or iliac aneurysm. Mild-to-moderate atherosclerotic plaque of the aorta and its branches. No abdominal, pelvic, or inguinal lymphadenopathy. Reproductive: Multiple coarsely calcified lesions within the uterus likely represents degenerative uterine fibroids. Other: Trace peripancreatic free fluid. No intraperitoneal free gas. Musculoskeletal: No abdominal wall hernia or abnormality. No suspicious lytic or blastic osseous lesions. No acute displaced fracture. Multilevel degenerative changes of the spine. IMPRESSION: 1. Status post cholecystectomy with interval development of a 7.6 x 6.7 cm gallbladder fossa abscess . 2. Trace right pleural effusion. 3. Multiple degenerative uterine fibroids.  4. Fast-incision state. Electronically Signed   By: Iven Finn M.D.   On: 07/06/2020 22:18   CT IMAGE GUIDED FLUID DRAIN BY CATHETER  Result Date: 07/07/2020 INDICATION: History of cholecystectomy performed on 05/05 by Dr. Constance Haw, now with fluid collection within the gallbladder fossa worrisome for abscess and/or bile leak. Please perform image guided drainage catheter placement for infection source control purposes. EXAM: ULTRASOUND AND CT GUIDED DRAINAGE CATHETER PLACEMENT INTO GALLBLADDER FOSSA FLUID COLLECTION COMPARISON:  CT abdomen pelvis-07/06/2020; nuclear medicine HIDA scan-earlier same day MEDICATIONS: The patient is currently admitted to the hospital and receiving intravenous antibiotics. The antibiotics were administered within an appropriate time frame prior to the initiation of the procedure. ANESTHESIA/SEDATION: Moderate (conscious) sedation was employed during this procedure. A total of Versed 3 mg and Fentanyl 100 mcg was administered intravenously. Moderate Sedation Time: 20 minutes. The patient's level of consciousness and vital signs were monitored continuously by radiology nursing throughout the procedure under my direct supervision. CONTRAST:  None  COMPLICATIONS: None immediate. PROCEDURE: Informed written consent was obtained from the patient after a discussion of the risks, benefits and alternatives to treatment. The patient was placed supine on the CT gantry and a pre procedural CT was performed re-demonstrating the known abscess/fluid collection within the gallbladder fossa with dominant component measuring approximately 8.0 x 6.5 cm (image 38, series 2). The table position was marked and the complex mixed echogenic fluid collection was identified sonographically. The procedure was planned. A timeout was performed prior to the initiation of the procedure. The skin overlying the right upper abdominal quadrant was prepped and draped in the usual sterile fashion. After the overlying  soft tissues were anesthetized with 1% lidocaine with epinephrine, an 18 gauge trocar needle was utilized to access the indeterminate fluid collection in the gallbladder fossa under direct ultrasound guidance using a transhepatic approach. Multiple ultrasound images were saved procedural documentation purposes. Appropriate positioning was confirmed with CT imaging. Next, the track was dilated allowing placement of a 12 French percutaneous catheter with end coiled and locked within the medial aspect of the indeterminate fluid collection. Next, approximately 175 cc of bilious appearing fluid was aspirated and postprocedural imaging was obtained The drainage catheter was connected to a gravity bag and secured in place within interrupted suture and a Stat Lock device. A dressing was applied. The patient tolerated the procedure well without immediate postprocedural complication. IMPRESSION: Successful ultrasound and CT guided placement of a 12 French all purpose drain catheter into the gallbladder fossa fluid collection with aspiration of 175 cc of bilious appearing fluid. Samples were sent to the laboratory as requested by the ordering clinical team. Electronically Signed   By: Sandi Mariscal M.D.   On: 07/07/2020 16:33   NM HEPATOBILIARY LEAK (POST-SURGICAL)  Result Date: 07/07/2020 CLINICAL DATA:  Evaluate for bile leak status post cholecystectomy. EXAM: NUCLEAR MEDICINE HEPATOBILIARY IMAGING TECHNIQUE: Sequential images of the abdomen were obtained out to 60 minutes following intravenous administration of radiopharmaceutical. RADIOPHARMACEUTICALS:  5.4 mCi Tc-69m  Choletec IV COMPARISON:  CT AP 07/06/2020 FINDINGS: Prompt uptake and biliary excretion of activity by the liver is seen. Biliary activity passes into small bowel, consistent with patent common bile duct. No abnormal radiotracer accumulation identified to suggest bile leak. IMPRESSION: 1. No signs of bile leak. 2. Patent common bile duct with normal  biliary to bowel transit. Electronically Signed   By: Kerby Moors M.D.   On: 07/07/2020 13:22        Scheduled Meds: . amLODipine  5 mg Oral Daily  . atorvastatin  20 mg Oral Daily  . enoxaparin (LOVENOX) injection  40 mg Subcutaneous Q24H  . insulin aspart  0-9 Units Subcutaneous TID WC  . insulin detemir  8 Units Subcutaneous BID  . pantoprazole  40 mg Oral Daily  . senna-docusate  2 tablet Oral QHS  . sodium chloride flush  5 mL Intracatheter Q8H   Continuous Infusions: . lactated ringers with kcl Stopped (07/07/20 1540)  . piperacillin-tazobactam (ZOSYN)  IV 3.375 g (07/08/20 1307)     LOS: 2 days     Cordelia Poche, MD [facility] 07/08/2020, 2:11 PM  If 7PM-7AM, please contact night-coverage [email or web address]

## 2020-07-09 DIAGNOSIS — K651 Peritoneal abscess: Secondary | ICD-10-CM | POA: Diagnosis not present

## 2020-07-09 LAB — GLUCOSE, CAPILLARY
Glucose-Capillary: 126 mg/dL — ABNORMAL HIGH (ref 70–99)
Glucose-Capillary: 261 mg/dL — ABNORMAL HIGH (ref 70–99)
Glucose-Capillary: 143 mg/dL — ABNORMAL HIGH (ref 70–99)
Glucose-Capillary: 144 mg/dL — ABNORMAL HIGH (ref 70–99)

## 2020-07-09 NOTE — Progress Notes (Signed)
Referring Physician(s): Romana Juniper  Supervising Physician: Sandi Mariscal  Patient Status:  [facility] - In-pt  Chief Complaint: Follow up gallbladder fossa fluid collection drain placed 5/11 in IR  Subjective:  Patient sitting up in chair talking on the phone, denies any complaints except some soreness at the insertion site which is mild. She is wondering when the drain can come out.  Discussed with RN, not much output noted from drain since placement - bag has not yet had to be emptied.  Allergies: Patient has no known allergies.  Medications: Prior to Admission medications   Medication Sig Start Date End Date Taking? Authorizing Provider  amLODipine (NORVASC) 5 MG tablet Take 5 mg by mouth 2 (two) times daily. 10/19/14  Yes [provider]  atorvastatin (LIPITOR) 20 MG tablet Take 1 tablet (20 mg total) by mouth daily. 12/17/18  Yes Elayne Snare, MD  Cholecalciferol (VITAMIN D3) 5000 UNITS CAPS Take 5,000 Units by mouth daily.   Yes [provider]  insulin NPH Human (NOVOLIN N RELION) 100 UNIT/ML injection 8 U in am and 12 U hs Patient taking differently: Inject 8-12 Units into the skin See admin instructions. Takes 8 units in the morning and 12 units at night 11/06/19  Yes Elayne Snare, MD  insulin regular (NOVOLIN R RELION) 100 units/mL injection INJECT 8 UNITS SUBCUTANEOUSLY WITH BREAKFAST AND LUNCH, AND 16 UNITS WITH DINNER Patient taking differently: Inject 8-16 Units into the skin See admin instructions. Takes 8 units  with breakfast and lunch, and 16 units with dinner 11/06/19  Yes Elayne Snare, MD  niacin (SLO-NIACIN) 500 MG tablet Take 500 mg by mouth at bedtime.   Yes [provider]  omeprazole (PRILOSEC) 40 MG capsule Take 40 mg by mouth daily.   Yes [provider]  ondansetron (ZOFRAN) 4 MG tablet Take 1 tablet (4 mg total) by mouth every 6 (six) hours as needed for nausea. 07/03/20  Yes Sheikh, Omair Latif, DO  oxyCODONE (OXY IR/ROXICODONE)  5 MG immediate release tablet Take 1-2 tablets (5-10 mg total) by mouth every 4 (four) hours as needed for moderate pain. 07/03/20  Yes Sheikh, Omair Latif, DO  valsartan-hydrochlorothiazide (DIOVAN-HCT) 160-12.5 MG tablet Take 1 tablet by mouth daily.   Yes [provider]  ACCU-CHEK SOFTCLIX LANCETS lancets Use bid 05/01/17   Elayne Snare, MD  Blood Glucose Monitoring Suppl (ACCU-CHEK GUIDE ME) w/Device KIT 1 each by Does not apply route 2 (two) times daily. Use accu chek guide me device to check blood sugar twice daily.DX:E11.65 12/23/18   Elayne Snare, MD  docusate sodium (COLACE) 100 MG capsule Take 1 capsule (100 mg total) by mouth 2 (two) times daily. Patient not taking: No sig reported 07/03/20   Raiford Noble Latif, DO  glucose blood (ACCU-CHEK GUIDE) test strip 1 each by Other route 2 (two) times daily. Use as instructed 12/17/18   Elayne Snare, MD     Vital Signs: BP (!) 130/51 (BP Location: Left Arm)   Pulse 72   Temp 98.3 F (36.8 C) (Oral)   Resp 17   Wt 195 lb 12.3 oz (88.8 kg)   SpO2 95%   BMI 31.60 kg/m   Physical Exam Vitals and nursing note reviewed.  Constitutional:      General: She is not in acute distress. HENT:     Head: Normocephalic.  Cardiovascular:     Rate and Rhythm: Normal rate.  Pulmonary:     Effort: Pulmonary effort is normal.  Abdominal:  Palpations: Abdomen is soft.     Comments: (+) RUQ drain to gravity with ~20 cc bloody bilious output, flushes/aspirates easily. Insertion site clean, dry, dressed appropriately. Mildly tender to palpation.   Skin:    General: Skin is warm and dry.  Neurological:     Mental Status: She is alert. Mental status is at baseline.     Imaging: CT ABDOMEN PELVIS W CONTRAST  Result Date: 07/06/2020 CLINICAL DATA:  Nonlocalized acute abdominal pain. EXAM: CT ABDOMEN AND PELVIS WITH CONTRAST TECHNIQUE: Multidetector CT imaging of the abdomen and pelvis was performed using the standard protocol following bolus  administration of intravenous contrast. CONTRAST:  68m OMNIPAQUE IOHEXOL 300 MG/ML  SOLN COMPARISON:  CT abdomen pelvis 06/30/2020 FINDINGS: Lower chest: Trace right pleural effusion. Right lower lobe passive atelectasis. Coronary artery calcifications. Hepatobiliary: No focal liver abnormality is seen. Status post cholecystectomy with interval development of a 7.6 x 6.7 x 7.2 cm gallbladder fossa fluid and gas collection (5:34). Slight peripheral enhancement noted. No biliary dilatation. Pancreas: No focal lesion. Normal pancreatic contour. No surrounding inflammatory changes. No main pancreatic ductal dilatation. Spleen: Normal in size without focal abnormality. Adrenals/Urinary Tract: No adrenal nodule bilaterally. Bilateral kidneys enhance symmetrically. No hydronephrosis. No hydroureter. The urinary bladder is unremarkable. Stomach/Bowel: Stomach is within normal limits. No evidence of bowel wall thickening or dilatation. Redemonstration of a third portion of the duodenum diverticulum (3:35). Fluid density within the lumen of the large bowel. Appendix appears normal. Vascular/Lymphatic: No abdominal aorta or iliac aneurysm. Mild-to-moderate atherosclerotic plaque of the aorta and its branches. No abdominal, pelvic, or inguinal lymphadenopathy. Reproductive: Multiple coarsely calcified lesions within the uterus likely represents degenerative uterine fibroids. Other: Trace peripancreatic free fluid. No intraperitoneal free gas. Musculoskeletal: No abdominal wall hernia or abnormality. No suspicious lytic or blastic osseous lesions. No acute displaced fracture. Multilevel degenerative changes of the spine. IMPRESSION: 1. Status post cholecystectomy with interval development of a 7.6 x 6.7 cm gallbladder fossa abscess . 2. Trace right pleural effusion. 3. Multiple degenerative uterine fibroids. 4. Fast-incision state. Electronically Signed   By: MIven FinnM.D.   On: 07/06/2020 22:18   CT IMAGE GUIDED FLUID  DRAIN BY CATHETER  Result Date: 07/07/2020 INDICATION: History of cholecystectomy performed on 05/05 by Dr. BConstance Haw now with fluid collection within the gallbladder fossa worrisome for abscess and/or bile leak. Please perform image guided drainage catheter placement for infection source control purposes. EXAM: ULTRASOUND AND CT GUIDED DRAINAGE CATHETER PLACEMENT INTO GALLBLADDER FOSSA FLUID COLLECTION COMPARISON:  CT abdomen pelvis-07/06/2020; nuclear medicine HIDA scan-earlier same day MEDICATIONS: The patient is currently admitted to the hospital and receiving intravenous antibiotics. The antibiotics were administered within an appropriate time frame prior to the initiation of the procedure. ANESTHESIA/SEDATION: Moderate (conscious) sedation was employed during this procedure. A total of Versed 3 mg and Fentanyl 100 mcg was administered intravenously. Moderate Sedation Time: 20 minutes. The patient's level of consciousness and vital signs were monitored continuously by radiology nursing throughout the procedure under my direct supervision. CONTRAST:  None COMPLICATIONS: None immediate. PROCEDURE: Informed written consent was obtained from the patient after a discussion of the risks, benefits and alternatives to treatment. The patient was placed supine on the CT gantry and a pre procedural CT was performed re-demonstrating the known abscess/fluid collection within the gallbladder fossa with dominant component measuring approximately 8.0 x 6.5 cm (image 38, series 2). The table position was marked and the complex mixed echogenic fluid collection was identified sonographically. The procedure was planned.  A timeout was performed prior to the initiation of the procedure. The skin overlying the right upper abdominal quadrant was prepped and draped in the usual sterile fashion. After the overlying soft tissues were anesthetized with 1% lidocaine with epinephrine, an 18 gauge trocar needle was utilized to access the  indeterminate fluid collection in the gallbladder fossa under direct ultrasound guidance using a transhepatic approach. Multiple ultrasound images were saved procedural documentation purposes. Appropriate positioning was confirmed with CT imaging. Next, the track was dilated allowing placement of a 12 French percutaneous catheter with end coiled and locked within the medial aspect of the indeterminate fluid collection. Next, approximately 175 cc of bilious appearing fluid was aspirated and postprocedural imaging was obtained The drainage catheter was connected to a gravity bag and secured in place within interrupted suture and a Stat Lock device. A dressing was applied. The patient tolerated the procedure well without immediate postprocedural complication. IMPRESSION: Successful ultrasound and CT guided placement of a 12 French all purpose drain catheter into the gallbladder fossa fluid collection with aspiration of 175 cc of bilious appearing fluid. Samples were sent to the laboratory as requested by the ordering clinical team. Electronically Signed   By: Sandi Mariscal M.D.   On: 07/07/2020 16:33   NM HEPATOBILIARY LEAK (POST-SURGICAL)  Result Date: 07/07/2020 CLINICAL DATA:  Evaluate for bile leak status post cholecystectomy. EXAM: NUCLEAR MEDICINE HEPATOBILIARY IMAGING TECHNIQUE: Sequential images of the abdomen were obtained out to 60 minutes following intravenous administration of radiopharmaceutical. RADIOPHARMACEUTICALS:  5.4 mCi Tc-28m Choletec IV COMPARISON:  CT AP 07/06/2020 FINDINGS: Prompt uptake and biliary excretion of activity by the liver is seen. Biliary activity passes into small bowel, consistent with patent common bile duct. No abnormal radiotracer accumulation identified to suggest bile leak. IMPRESSION: 1. No signs of bile leak. 2. Patent common bile duct with normal biliary to bowel transit. Electronically Signed   By: TKerby MoorsM.D.   On: 07/07/2020 13:22    Labs:  CBC: Recent  Labs    07/03/20 0506 07/06/20 1805 07/07/20 0238 07/08/20 0128  WBC 10.1 11.6* 10.3 8.8  HGB 10.7* 12.3 10.9* 10.4*  HCT 34.7* 39.1 35.1* 33.3*  PLT 205 381 354 376    COAGS: Recent Labs    07/07/20 1336  INR 1.1    BMP: Recent Labs    07/03/20 0506 07/06/20 1805 07/07/20 0238 07/08/20 0128  NA 137 133* 135 135  K 3.2* 3.2* 4.1 3.9  CL 106 97* 103 102  CO2 _0 GLUCOSE 61* 144* 128* 151*  BUN 15 7* 8 7*  CALCIUM 8.4* 9.1 8.9 8.7*  CREATININE 0.66 0.92 1.06* 0.78  GFRNONAA >60 >60 53* >60    LIVER FUNCTION TESTS: Recent Labs    07/03/20 0506 07/06/20 1805 07/07/20 0238 07/08/20 0128  BILITOT 0.6 0.9 0.7 0.5  AST 25 16 13* 14*  ALT _1 ALKPHOS 59 81 70 61  PROT 5.5* 6.5 5.3* 5.1*  ALBUMIN 2.5* 2.9* 2.4* 2.3*    Assessment and Plan:  80y/o s/p gall bladder fossa fluid collection drain placement 5/11 in IR seen today for routine follow up.  No output recorded in I/O - per RN bag has yet to be emptied due to minimal output, 175 cc bilious appearing fluid was removed at time of placement. On my exam there is ~20 cc of bloody bilious output, drain flushed and aspirated by myself without difficulty. Culture of aspirate shows NGTD.  Plan to continue BID flushes, RN to record output Qshift. If output remains minimal and patient remains in house will plan for drain injection/possible removal on Monday -- if she is to be discharged over the weekend please contact IR so we can setup outpatient follow up.  Electronically Signed: Joaquim Nam, PA-C 07/09/2020, 12:29 PM   I spent a total of 15 Minutes at the the patient's bedside AND on the patient's hospital floor or unit, greater than 50% of which was counseling/coordinating care for gall bladder fossa fluid collection drain follow up.

## 2020-07-09 NOTE — Progress Notes (Signed)
Occupational Therapy Treatment Patient Details Name: Sandra Washington MRN: [id number] DOB: Sep 19, 1940 Today's Date: 07/09/2020    History of present illness Pt is an 80 yo female presenting 5/10 from PCP due to continued abdominal pain, nausea, vomiting, and weakness after gallbladder removal on 5/5.  Upon workup, pt found to have gallbladder fossa abcess, started on antibiotics. s/p drain placement 5/11. PMH includes: CKD, DM II, HTN, and stroke.   OT comments  Pt progressing towards goals, reports feeling well this morning. Pt completed all transfers and mobility this session with close min guard for safety. Toileting and grooming completed with set up / min guard. Pt with reports of feeling more energized this session, however is still quick to fatigue with OOB activity. Pt continues to benefit from acute services to increase endurance and safety in ADLs and mobility. D/c plan remains appropriate.    Follow Up Recommendations  No OT follow up;Supervision - Intermittent    Equipment Recommendations  3 in 1 bedside commode       Precautions / Restrictions Precautions Precautions: Fall Precaution Comments: x1 fall Restrictions Weight Bearing Restrictions: No       Mobility Bed Mobility Overal bed mobility: Needs Assistance Bed Mobility: Rolling;Sidelying to Sit Rolling: Min guard Sidelying to sit: Min guard            Transfers Overall transfer level: Needs assistance Equipment used: Rolling walker (2 wheeled)   Sit to Stand: Min assist;From elevated surface                  ADL either performed or assessed with clinical judgement   ADL Overall ADL's : At baseline;Needs assistance/impaired     Grooming: Set up;Sitting   Upper Body Bathing: Set up;Sitting   Lower Body Bathing: Set up;Sit to/from stand           Toilet Transfer: Min guard;RW;BSC   Toileting- Water quality scientist and Hygiene: Min guard;Sit to/from stand       Functional  mobility during ADLs: Min guard;Rolling walker                 Cognition Arousal/Alertness: Awake/alert Behavior During Therapy: WFL for tasks assessed/performed Overall Cognitive Status: Within Functional Limits for tasks assessed                    General Comments no new skin integrity issues noted, IV and drian in tact    Pertinent Vitals/ Pain       Pain Assessment: Faces Faces Pain Scale: Hurts a little bit Pain Location: R abdomen, near drain Pain Descriptors / Indicators: Discomfort Pain Intervention(s): Monitored during session;Limited activity within patient's tolerance         Frequency  Min 2X/week        Progress Toward Goals  OT Goals(current goals can now be found in the care plan section)  Progress towards OT goals: Progressing toward goals  Acute Rehab OT Goals Patient Stated Goal: to go home OT Goal Formulation: With patient Time For Goal Achievement: 07/21/20 Potential to Achieve Goals: Good ADL Goals Additional ADL Goal #1: Pt will state 3 energy conservation techniques for OOB ADL and mobility. Additional ADL Goal #2: Pt will perform OOB ADL x15 mins in standing with 1 seated rest break to increase endurance for ADL.  Plan Discharge plan remains appropriate       AM-PAC OT "6 Clicks" Daily Activity     Outcome Measure   Help from another person eating meals?: None  Help from another person taking care of personal grooming?: None Help from another person toileting, which includes using toliet, bedpan, or urinal?: None Help from another person bathing (including washing, rinsing, drying)?: None Help from another person to put on and taking off regular upper body clothing?: None Help from another person to put on and taking off regular lower body clothing?: None 6 Click Score: 24    End of Session Equipment Utilized During Treatment: Gait belt;Rolling walker  OT Visit Diagnosis: Unsteadiness on feet (R26.81)   Activity Tolerance  Patient tolerated treatment well   Patient Left in chair;with call bell/phone within reach   Nurse Communication Mobility status        Time: 2683-4196 OT Time Calculation (min): 23 min  Charges: OT General Charges $OT Visit: 1 Visit OT Treatments $Self Care/Home Management : 23-37 mins     Bearl Talarico A Bralyn Espino 07/09/2020, 12:37 PM

## 2020-07-09 NOTE — Care Management Important Message (Signed)
Important Message  Patient Details  Name: Sandra Washington MRN: [id number] Date of Birth: 1940/10/27   Medicare Important Message Given:  Yes - Important Message mailed due to current National Emergency   Verbal consent obtained due to current National Emergency  Relationship to patient: Self Contact Name: Thayer Call Date: 07/09/20  Time: 1132 Phone: [phone] Outcome: Spoke with contact Important Message mailed to: Patient address on file    Delorse Lek 07/09/2020, 11:33 AM

## 2020-07-09 NOTE — Progress Notes (Signed)
Physical Therapy Treatment Patient Details Name: Sandra Washington MRN: [id number] DOB: 05/10/1940 Today's Date: 07/09/2020    History of Present Illness Pt is an 80 yo female presenting 5/10 from PCP due to continued abdominal pain, nausea, vomiting, and weakness after gallbladder removal on 5/5.  Upon workup, pt found to have gallbladder fossa abcess, started on antibiotics. s/p drain placement 5/11. PMH includes: CKD, DM II, HTN, and stroke.    PT Comments    Pt supine in bed on arrival this session.  Pt required cues for logrolling and progressed to gt with increased ambulation. She continues to improve and on track to return home with follow up HHPT.  Will continue to follow.     Follow Up Recommendations  Home health PT;Supervision for mobility/OOB     Equipment Recommendations  Rolling walker with 5" wheels;3in1 (PT)    Recommendations for Other Services       Precautions / Restrictions Precautions Precautions: Fall Precaution Comments: x1 fall Restrictions Weight Bearing Restrictions: No    Mobility  Bed Mobility Overal bed mobility: Needs Assistance Bed Mobility: Rolling;Sidelying to Sit;Sit to Sidelying Rolling: Min guard Sidelying to sit: Min guard     Sit to sidelying: Min assist General bed mobility comments: Increased assistance to return back to bed.  Pt required cues for log rolling throughout.    Transfers Overall transfer level: Needs assistance Equipment used: Rolling walker (2 wheeled) Transfers: Sit to/from Stand Sit to Stand: Min guard         General transfer comment: Cues for hand placement and safety to back entirely to seated surface before sitting.  Ambulation/Gait Ambulation/Gait assistance: Min guard Gait Distance (Feet): 40 Feet Assistive device: Rolling walker (2 wheeled) Gait Pattern/deviations: Decreased stride length;Shuffle;Step-to pattern;Trunk flexed Gait velocity: decreased   General Gait Details: Cues for scap  retraction and upper trunk control.  Cues to keep RW close to her person with turns or backing.   Stairs             Wheelchair Mobility    Modified Rankin (Stroke Patients Only)       Balance Overall balance assessment: Mild deficits observed, not formally tested                                          Cognition Arousal/Alertness: Awake/alert Behavior During Therapy: WFL for tasks assessed/performed Overall Cognitive Status: Within Functional Limits for tasks assessed                                        Exercises      General Comments        Pertinent Vitals/Pain Pain Assessment: 0-10 Pain Score: 7  Pain Location: R abdomen, near drain Pain Descriptors / Indicators: Discomfort Pain Intervention(s): Monitored during session;Repositioned (attempted  to splint drain site with a pillow this did not improve her pain.)    Home Living                      Prior Function            PT Goals (current goals can now be found in the care plan section) Acute Rehab PT Goals Patient Stated Goal: to go home Potential to Achieve Goals: Good Progress towards PT goals: Progressing toward  goals    Frequency    Min 4X/week      PT Plan Current plan remains appropriate    Co-evaluation              AM-PAC PT "6 Clicks" Mobility   Outcome Measure  Help needed turning from your back to your side while in a flat bed without using bedrails?: A Little Help needed moving from lying on your back to sitting on the side of a flat bed without using bedrails?: A Little Help needed moving to and from a bed to a chair (including a wheelchair)?: A Little Help needed standing up from a chair using your arms (e.g., wheelchair or bedside chair)?: A Little Help needed to walk in hospital room?: A Little Help needed climbing 3-5 steps with a railing? : A Little 6 Click Score: 18    End of Session Equipment Utilized During  Treatment: Gait belt Activity Tolerance: Patient tolerated treatment well Patient left: in bed;with bed alarm set;with call bell/phone within reach Nurse Communication: Mobility status PT Visit Diagnosis: Other abnormalities of gait and mobility (R26.89);Muscle weakness (generalized) (M62.81);Pain Pain - Right/Left: Right Pain - part of body:  (abdomen)     Time: 4008-6761 PT Time Calculation (min) (ACUTE ONLY): 20 min  Charges:  $Gait Training: 8-22 mins                     Erasmo Leventhal , PTA Acute Rehabilitation Services Pager [phone] Office [place] 07/09/2020, 6:29 PM

## 2020-07-09 NOTE — Plan of Care (Signed)
  Problem: Health Behavior/Discharge Planning: Goal: Ability to manage health-related needs will improve Outcome: Progressing   Problem: Clinical Measurements: Goal: Diagnostic test results will improve Outcome: Progressing   Problem: Activity: Goal: Risk for activity intolerance will decrease Outcome: Progressing   

## 2020-07-09 NOTE — Progress Notes (Signed)
PROGRESS NOTE    SHAYA REDDICK  YWV:[id number] DOB: Jun 10, 1940 DOA: 07/06/2020 PCP: Lucianne Lei, MD   Brief Narrative: LORYNN MOESER is a 80 y.o. female with history of hypertension, hyperlipidemia, diabetes mellitus type 2, GERD.  Patient presents secondary to right upper quadrant pain, nausea, vomiting and found to have a large intra-abdominal abscess located in her gallbladder fossa.  General surgery and interventional radiology consulted on admission.  Plan for percutaneous drain placement.  IV antibiotics started on admission.  07/09/2020: Patient seen alongside patient's nurse.  Gallbladder fossa drain is still draining some fluid.  Blood cultures finalized.  Patient is on antibiotics.  We will have low threshold to consult infectious disease to advise duration of antibiotics.   Assessment & Plan:   Principal Problem:   Intra-abdominal abscess ([place]) Active Problems:   Hypertension, essential   Type 2 diabetes mellitus with hyperlipidemia (HCC)   Intra-abdominal abscess vs bile leak CT abdomen/pelvis suggests large abscess related to recent laparoscopic cholecystectomy. General surgery consulted on admission along with IR. Zosyn IV initiated empirically. Blood cultures obtained on admission.  Drain placed by IR on 5/11 with initial description of bilious fluid.  HIDA scan without suggestion of bile leak. -Continue Zosyn IV; follow-up blood cultures -General surgery recommendations: Signed off 07/09/2020: HIDA scan is negative for bile leak.  Patient is on IV antibiotics.  Intractable nausea/vomiting/diarrhea Symptoms appear to be improved. IV fluids started.  Improved. -Continue compazine prn -Advance diet as tolerated 07/09/2020: Resolved significantly.  Diabetes mellitus, type 2 Last hemoglobin A1c of 6.4% from March 2022.  Patient is managed as an outpatient on insulin NPH 8 units in a.m. and 12 units in the evening in addition to insulin regular 8 units  with breakfast and lunch in addition to 16 units with dinner.  On admission, patient started on sliding scale. -Continue Levemir 8 units twice daily while inpatient and continue sliding scale insulin  Primary hypertension Patient is on valsartan hydrochlorothiazide as an outpatient.  Blood pressure is slightly uncontrolled.  Patient was started on amlodipine 5 mg while inpatient. -Continue amlodipine for now but will transition to valsartan-hydrochlorothiazide once creatinine/fluid status has stabilized.  GERD -Continue Protonix   DVT prophylaxis: SCDs Code Status:   Code Status: Full Code Family Communication: None at bedside Disposition Plan: Anticipate discharge likely home in 1 to 3 days pending PT recommendations, culture data   Consultants:   General surgery  Interventional radiology  Procedures:   CT GUIDED PLACEMENT OF A 12 FR DRAIN CATHETER (07/07/2020)  Antimicrobials:  Zosyn IV   Subjective: No new complaints. No fever or chills.    Objective: Vitals:   07/08/20 1930 07/09/20 0422 07/09/20 0424 07/09/20 0749  BP: (!) 136/51  (!) 141/61 (!) 130/51  Pulse: 82  71 72  Resp: 17  18 17   Temp: 99.1 F (37.3 C)  99.1 F (37.3 C) 98.3 F (36.8 C)  TempSrc:   Oral Oral  SpO2: 96%  96% 95%  Weight:  88.8 kg      Intake/Output Summary (Last 24 hours) at 07/09/2020 1337 Last data filed at 07/08/2020 1700 Gross per 24 hour  Intake 406.09 ml  Output --  Net 406.09 ml   Filed Weights   07/09/20 0422  Weight: 88.8 kg    Examination:  General exam: Appears calm and comfortable Respiratory system: Clear to auscultation. Respiratory effort normal. Cardiovascular system: S1 & S2 heard, RRR. No murmurs, rubs, gallops or clicks. Gastrointestinal system: Abdomen is nondistended,  soft and mildly tender in right upper quadrant. No organomegaly or masses felt. Normal bowel sounds heard. Central nervous system: Alert and oriented. No focal neurological  deficits. Musculoskeletal: No edema. No calf tenderness Skin: No cyanosis. No rashes Psychiatry: Judgement and insight appear normal. Mood & affect appropriate.    Data Reviewed: I have personally reviewed following labs and imaging studies  CBC Lab Results  Component Value Date   WBC 8.8 07/08/2020   RBC 3.87 07/08/2020   HGB 10.4 (L) 07/08/2020   HCT 33.3 (L) 07/08/2020   MCV 86.0 07/08/2020   MCH 26.9 07/08/2020   PLT 376 07/08/2020   MCHC 31.2 07/08/2020   RDW 14.1 07/08/2020   LYMPHSABS 2.4 07/06/2020   MONOABS 1.2 (H) 07/06/2020   EOSABS 0.1 07/06/2020   BASOSABS 0.1 88/91/6945     Last metabolic panel Lab Results  Component Value Date   NA 135 07/08/2020   K 3.9 07/08/2020   CL 102 07/08/2020   CO2 24 07/08/2020   BUN 7 (L) 07/08/2020   CREATININE 0.78 07/08/2020   GLUCOSE 151 (H) 07/08/2020   GFRNONAA >60 07/08/2020   GFRAA >90 04/09/2012   CALCIUM 8.7 (L) 07/08/2020   PHOS 3.8 07/07/2020   PROT 5.1 (L) 07/08/2020   ALBUMIN 2.3 (L) 07/08/2020   BILITOT 0.5 07/08/2020   ALKPHOS 61 07/08/2020   AST 14 (L) 07/08/2020   ALT 16 07/08/2020   ANIONGAP 9 07/08/2020    CBG (last 3)  Recent Labs    07/08/20 2116 07/09/20 0650 07/09/20 1107  GLUCAP 202* 126* 261*     GFR: Estimated Creatinine Clearance: 61.9 mL/min (by C-G formula based on SCr of 0.78 mg/dL).  Coagulation Profile: Recent Labs  Lab 07/07/20 1336  INR 1.1    Recent Results (from the past 240 hour(s))  SARS CORONAVIRUS 2 (TAT 6-24 HRS) Nasopharyngeal Nasopharyngeal Swab     Status: None   Collection Time: 06/30/20  5:50 AM   Specimen: Nasopharyngeal Swab  Result Value Ref Range Status   SARS Coronavirus 2 NEGATIVE NEGATIVE Final    Comment: (NOTE) SARS-CoV-2 target nucleic acids are NOT DETECTED.  The SARS-CoV-2 RNA is generally detectable in upper and lower respiratory specimens during the acute phase of infection. Negative results do not preclude SARS-CoV-2 infection, do not  rule out co-infections with other pathogens, and should not be used as the sole basis for treatment or other patient management decisions. Negative results must be combined with clinical observations, patient history, and epidemiological information. The expected result is Negative.  Fact Sheet for Patients: SugarRoll.be  Fact Sheet for Healthcare Providers: https://www.woods-mathews.com/  This test is not yet approved or cleared by the [place] FDA and  has been authorized for detection and/or diagnosis of SARS-CoV-2 by FDA under an Emergency Use Authorization (EUA). This EUA will remain  in effect (meaning this test can be used) for the duration of the COVID-19 declaration under Se ction 564(b)(1) of the Act, 21 U.S.C. section 360bbb-3(b)(1), unless the authorization is terminated or revoked sooner.  Performed at [facility], [place] [address], [place], [place] [address]   MRSA PCR Screening     Status: None   Collection Time: 07/01/20  2:57 AM   Specimen: Nasal Mucosa; Nasopharyngeal  Result Value Ref Range Status   MRSA by PCR NEGATIVE NEGATIVE Final    Comment:        The GeneXpert MRSA Assay (FDA approved for NASAL specimens only), is one component of a  comprehensive MRSA colonization surveillance program. It is not intended to diagnose MRSA infection nor to guide or monitor treatment for MRSA infections. Performed at [facility], [address] [place], [place] [address]   Culture, blood (routine x 2)     Status: Abnormal   Collection Time: 07/06/20 12:33 AM   Specimen: BLOOD  Result Value Ref Range Status   Specimen Description BLOOD SITE NOT SPECIFIED  Final   Special Requests   Final    BOTTLES DRAWN AEROBIC AND ANAEROBIC Blood Culture adequate volume   Culture  Setup Time   Final    GRAM POSITIVE RODS AEROBIC BOTTLE ONLY CRITICAL RESULT CALLED TO, READ BACK BY AND VERIFIED WITH: PHARM D M.BITONTI ON  [id number] AT 1610 BY E.PARRISH    Culture (A)  Final    BACILLUS SPECIES Standardized susceptibility testing for this organism is not available. Performed at [facility], [address] [place], [place] [address]    Report Status 07/08/2020 FINAL  Final  Resp Panel by RT-PCR (Flu A&B, Covid) Nasopharyngeal Swab     Status: None   Collection Time: 07/06/20 11:53 PM   Specimen: Nasopharyngeal Swab; Nasopharyngeal(NP) swabs in vial transport medium  Result Value Ref Range Status   SARS Coronavirus 2 by RT PCR NEGATIVE NEGATIVE Final    Comment: (NOTE) SARS-CoV-2 target nucleic acids are NOT DETECTED.  The SARS-CoV-2 RNA is generally detectable in upper respiratory specimens during the acute phase of infection. The lowest concentration of SARS-CoV-2 viral copies this assay can detect is 138 copies/mL. A negative result does not preclude SARS-Cov-2 infection and should not be used as the sole basis for treatment or other patient management decisions. A negative result may occur with  improper specimen collection/handling, submission of specimen other than nasopharyngeal swab, presence of viral mutation(s) within the areas targeted by this assay, and inadequate number of viral copies(<138 copies/mL). A negative result must be combined with clinical observations, patient history, and epidemiological information. The expected result is Negative.  Fact Sheet for Patients:  BloggerCourse.com  Fact Sheet for Healthcare Providers:  SeriousBroker.it  This test is no t yet approved or cleared by the [place] FDA and  has been authorized for detection and/or diagnosis of SARS-CoV-2 by FDA under an Emergency Use Authorization (EUA). This EUA will remain  in effect (meaning this test can be used) for the duration of the COVID-19 declaration under Section 564(b)(1) of the Act, 21 U.S.C.section 360bbb-3(b)(1), unless the authorization  is terminated  or revoked sooner.       Influenza A by PCR NEGATIVE NEGATIVE Final   Influenza B by PCR NEGATIVE NEGATIVE Final    Comment: (NOTE) The Xpert Xpress SARS-CoV-2/FLU/RSV plus assay is intended as an aid in the diagnosis of influenza from Nasopharyngeal swab specimens and should not be used as a sole basis for treatment. Nasal washings and aspirates are unacceptable for Xpert Xpress SARS-CoV-2/FLU/RSV testing.  Fact Sheet for Patients: BloggerCourse.com  Fact Sheet for Healthcare Providers: SeriousBroker.it  This test is not yet approved or cleared by the [place] FDA and has been authorized for detection and/or diagnosis of SARS-CoV-2 by FDA under an Emergency Use Authorization (EUA). This EUA will remain in effect (meaning this test can be used) for the duration of the COVID-19 declaration under Section 564(b)(1) of the Act, 21 U.S.C. section 360bbb-3(b)(1), unless the authorization is terminated or revoked.  Performed at [facility], [address] [place], [place] [address]   Culture,  blood (routine x 2)     Status: None (Preliminary result)   Collection Time: 07/07/20  4:26 AM   Specimen: BLOOD RIGHT HAND  Result Value Ref Range Status   Specimen Description BLOOD RIGHT HAND  Final   Special Requests   Final    BOTTLES DRAWN AEROBIC AND ANAEROBIC Blood Culture adequate volume   Culture   Final    NO GROWTH 1 DAY Performed at [facility], [address] [place], [place] [address]    Report Status PENDING  Incomplete  Aerobic/Anaerobic Culture (surgical/deep wound)     Status: None (Preliminary result)   Collection Time: 07/07/20  3:57 PM   Specimen: Abscess  Result Value Ref Range Status   Specimen Description ABSCESS  Final   Special Requests DRAIN  Final   Gram Stain   Final    FEW WBC PRESENT,BOTH PMN AND MONONUCLEAR NO ORGANISMS SEEN    Culture   Final    NO GROWTH < 24  HOURS Performed at [facility], [place] [address] [place], [place] [address]    Report Status PENDING  Incomplete        Radiology Studies: CT IMAGE GUIDED FLUID DRAIN BY CATHETER  Result Date: 07/07/2020 INDICATION: History of cholecystectomy performed on 05/05 by Dr. Constance Haw, now with fluid collection within the gallbladder fossa worrisome for abscess and/or bile leak. Please perform image guided drainage catheter placement for infection source control purposes. EXAM: ULTRASOUND AND CT GUIDED DRAINAGE CATHETER PLACEMENT INTO GALLBLADDER FOSSA FLUID COLLECTION COMPARISON:  CT abdomen pelvis-07/06/2020; nuclear medicine HIDA scan-earlier same day MEDICATIONS: The patient is currently admitted to the hospital and receiving intravenous antibiotics. The antibiotics were administered within an appropriate time frame prior to the initiation of the procedure. ANESTHESIA/SEDATION: Moderate (conscious) sedation was employed during this procedure. A total of Versed 3 mg and Fentanyl 100 mcg was administered intravenously. Moderate Sedation Time: 20 minutes. The patient's level of consciousness and vital signs were monitored continuously by radiology nursing throughout the procedure under my direct supervision. CONTRAST:  None COMPLICATIONS: None immediate. PROCEDURE: Informed written consent was obtained from the patient after a discussion of the risks, benefits and alternatives to treatment. The patient was placed supine on the CT gantry and a pre procedural CT was performed re-demonstrating the known abscess/fluid collection within the gallbladder fossa with dominant component measuring approximately 8.0 x 6.5 cm (image 38, series 2). The table position was marked and the complex mixed echogenic fluid collection was identified sonographically. The procedure was planned. A timeout was performed prior to the initiation of the procedure. The skin overlying the right upper abdominal quadrant was prepped and  draped in the usual sterile fashion. After the overlying soft tissues were anesthetized with 1% lidocaine with epinephrine, an 18 gauge trocar needle was utilized to access the indeterminate fluid collection in the gallbladder fossa under direct ultrasound guidance using a transhepatic approach. Multiple ultrasound images were saved procedural documentation purposes. Appropriate positioning was confirmed with CT imaging. Next, the track was dilated allowing placement of a 12 French percutaneous catheter with end coiled and locked within the medial aspect of the indeterminate fluid collection. Next, approximately 175 cc of bilious appearing fluid was aspirated and postprocedural imaging was obtained The drainage catheter was connected to a gravity bag and secured in place within interrupted suture and a Stat Lock device. A dressing was applied. The patient tolerated the procedure well without immediate postprocedural complication. IMPRESSION: Successful ultrasound and CT guided placement  of a 59 [place] all purpose drain catheter into the gallbladder fossa fluid collection with aspiration of 175 cc of bilious appearing fluid. Samples were sent to the laboratory as requested by the ordering clinical team. Electronically Signed   By: Simonne Come M.D.   On: 07/07/2020 16:33        Scheduled Meds: . amLODipine  5 mg Oral Daily  . atorvastatin  20 mg Oral Daily  . enoxaparin (LOVENOX) injection  40 mg Subcutaneous Q24H  . insulin aspart  0-9 Units Subcutaneous TID WC  . insulin detemir  8 Units Subcutaneous BID  . pantoprazole  40 mg Oral Daily  . senna-docusate  2 tablet Oral QHS  . sodium chloride flush  5 mL Intracatheter Q8H   Continuous Infusions: . piperacillin-tazobactam (ZOSYN)  IV 3.375 g (07/09/20 1259)     LOS: 3 days     Berton Mount, MD [facility] 07/09/2020, 1:37 PM  If 7PM-7AM, please contact night-coverage [email or web address]

## 2020-07-10 DIAGNOSIS — K651 Peritoneal abscess: Secondary | ICD-10-CM | POA: Diagnosis not present

## 2020-07-10 LAB — GLUCOSE, CAPILLARY
Glucose-Capillary: 84 mg/dL (ref 70–99)
Glucose-Capillary: 196 mg/dL — ABNORMAL HIGH (ref 70–99)
Glucose-Capillary: 189 mg/dL — ABNORMAL HIGH (ref 70–99)
Glucose-Capillary: 120 mg/dL — ABNORMAL HIGH (ref 70–99)

## 2020-07-10 NOTE — Progress Notes (Signed)
PROGRESS NOTE    [name]  HAL:[id number] DOB: [birth year] DOA: 07/06/2020 PCP: Lucianne Lei, MD   Brief Narrative: Sandra Washington is a 80 y.o. female with history of hypertension, hyperlipidemia, diabetes mellitus type 2, GERD.  Patient presents secondary to right upper quadrant pain, nausea, vomiting and found to have a large intra-abdominal abscess located in her gallbladder fossa.  General surgery and interventional radiology consulted on admission.  Plan for percutaneous drain placement.  IV antibiotics started on admission.  07/09/2020: Patient seen alongside patient's nurse.  Gallbladder fossa drain is still draining some fluid.  Blood cultures finalized.  Patient is on antibiotics.  We will have low threshold to consult infectious disease to advise duration of antibiotics. 07/10/2020: Patient seen.  No new changes.  We will consult infectious disease.  Assessment & Plan:   Principal Problem:   Intra-abdominal abscess ([place]) Active Problems:   Hypertension, essential   Type 2 diabetes mellitus with hyperlipidemia ([facility])   Intra-abdominal abscess vs bile leak CT abdomen/pelvis suggests large abscess related to recent laparoscopic cholecystectomy. General surgery consulted on admission along with IR. Zosyn IV initiated empirically. Blood cultures obtained on admission.  Drain placed by IR on 5/11 with initial description of bilious fluid.  HIDA scan without suggestion of bile leak. -Continue Zosyn IV; follow-up blood cultures -General surgery recommendations: Signed off 07/09/2020: HIDA scan is negative for bile leak.  Patient is on IV antibiotics.  Intractable nausea/vomiting/diarrhea Symptoms appear to be improved. IV fluids started.  Improved. -Continue compazine prn -Advance diet as tolerated 07/09/2020: Resolved significantly.  Diabetes mellitus, type 2 Last hemoglobin A1c of 6.4% from March 2022.  Patient is managed as an outpatient on insulin NPH 8  units in a.m. and 12 units in the evening in addition to insulin regular 8 units with breakfast and lunch in addition to 16 units with dinner.  On admission, patient started on sliding scale. -Continue Levemir 8 units twice daily while inpatient and continue sliding scale insulin  Primary hypertension Patient is on valsartan hydrochlorothiazide as an outpatient.  Blood pressure is slightly uncontrolled.  Patient was started on amlodipine 5 mg while inpatient. -Continue amlodipine for now but will transition to valsartan-hydrochlorothiazide once creatinine/fluid status has stabilized.  GERD -Continue Protonix   DVT prophylaxis: SCDs Code Status:   Code Status: Full Code Family Communication: None at bedside Disposition Plan: Anticipate discharge likely home in 1 to 3 days pending PT recommendations, culture data   Consultants:   General surgery  Interventional radiology  Procedures:   CT GUIDED PLACEMENT OF A 12 FR DRAIN CATHETER (07/07/2020)  Antimicrobials:  Zosyn IV   Subjective: No new complaints. No fever or chills.    Objective: Vitals:   07/09/20 2100 07/10/20 0436 07/10/20 0732 07/10/20 1458  BP: (!) 156/61 (!) 152/62 (!) 132/56 (!) 135/59  Pulse: 78 70 70 78  Resp: 17 17 16 17   Temp: 98.3 F (36.8 C) 98.6 F (37 C) 98.5 F (36.9 C) 98.4 F (36.9 C)  TempSrc: Oral Oral Oral Oral  SpO2: 98% 97% 92% 100%  Weight:        Intake/Output Summary (Last 24 hours) at 07/10/2020 1727 Last data filed at 07/10/2020 1200 Gross per 24 hour  Intake 592.71 ml  Output 426 ml  Net 166.71 ml   Filed Weights   07/09/20 0422  Weight: 88.8 kg    Examination:  General exam: Appears calm and comfortable Respiratory system: Clear to auscultation. Respiratory effort normal. Cardiovascular system:  S1 & S2 heard, RRR. No murmurs, rubs, gallops or clicks. Gastrointestinal system: Abdomen is nondistended, soft and mildly tender in right upper quadrant. No organomegaly or  masses felt. Normal bowel sounds heard. Central nervous system: Alert and oriented. No focal neurological deficits. Musculoskeletal: No edema. No calf tenderness Skin: No cyanosis. No rashes Psychiatry: Judgement and insight appear normal. Mood & affect appropriate.    Data Reviewed: I have personally reviewed following labs and imaging studies  CBC Lab Results  Component Value Date   WBC 8.8 07/08/2020   RBC 3.87 07/08/2020   HGB 10.4 (L) 07/08/2020   HCT 33.3 (L) 07/08/2020   MCV 86.0 07/08/2020   MCH 26.9 07/08/2020   PLT 376 07/08/2020   MCHC 31.2 07/08/2020   RDW 14.1 07/08/2020   LYMPHSABS 2.4 07/06/2020   MONOABS 1.2 (H) 07/06/2020   EOSABS 0.1 07/06/2020   BASOSABS 0.1 76/73/4193     Last metabolic panel Lab Results  Component Value Date   NA 135 07/08/2020   K 3.9 07/08/2020   CL 102 07/08/2020   CO2 24 07/08/2020   BUN 7 (L) 07/08/2020   CREATININE 0.78 07/08/2020   GLUCOSE 151 (H) 07/08/2020   GFRNONAA >60 07/08/2020   GFRAA >90 04/09/2012   CALCIUM 8.7 (L) 07/08/2020   PHOS 3.8 07/07/2020   PROT 5.1 (L) 07/08/2020   ALBUMIN 2.3 (L) 07/08/2020   BILITOT 0.5 07/08/2020   ALKPHOS 61 07/08/2020   AST 14 (L) 07/08/2020   ALT 16 07/08/2020   ANIONGAP 9 07/08/2020    CBG (last 3)  Recent Labs    07/10/20 0635 07/10/20 1105 07/10/20 1619  GLUCAP 84 196* 189*     GFR: Estimated Creatinine Clearance: 61.9 mL/min (by C-G formula based on SCr of 0.78 mg/dL).  Coagulation Profile: Recent Labs  Lab 07/07/20 1336  INR 1.1    Recent Results (from the past 240 hour(s))  MRSA PCR Screening     Status: None   Collection Time: 07/01/20  2:57 AM   Specimen: Nasal Mucosa; Nasopharyngeal  Result Value Ref Range Status   MRSA by PCR NEGATIVE NEGATIVE Final    Comment:        The GeneXpert MRSA Assay (FDA approved for NASAL specimens only), is one component of a comprehensive MRSA colonization surveillance program. It is not intended to diagnose  MRSA infection nor to guide or monitor treatment for MRSA infections. Performed at [facility], [address] [place], [place] [address]   Culture, blood (routine x 2)     Status: Abnormal   Collection Time: 07/06/20 12:33 AM   Specimen: BLOOD  Result Value Ref Range Status   Specimen Description BLOOD SITE NOT SPECIFIED  Final   Special Requests   Final    BOTTLES DRAWN AEROBIC AND ANAEROBIC Blood Culture adequate volume   Culture  Setup Time   Final    GRAM POSITIVE RODS AEROBIC BOTTLE ONLY CRITICAL RESULT CALLED TO, READ BACK BY AND VERIFIED WITH: PHARM D M.BITONTI ON [id number] AT 6 BY E.PARRISH    Culture (A)  Final    BACILLUS SPECIES Standardized susceptibility testing for this organism is not available. Performed at [facility], [place] [address], [place], [place] [address]    Report Status 07/08/2020 FINAL  Final  Resp Panel by RT-PCR (Flu A&B, Covid) Nasopharyngeal Swab     Status: None   Collection Time: 07/06/20 11:53 PM   Specimen: Nasopharyngeal Swab; Nasopharyngeal(NP) swabs in vial transport medium  Result Value Ref Range Status   SARS Coronavirus 2 by RT PCR NEGATIVE NEGATIVE Final    Comment: (NOTE) SARS-CoV-2 target nucleic acids are NOT DETECTED.  The SARS-CoV-2 RNA is generally detectable in upper respiratory specimens during the acute phase of infection. The lowest concentration of SARS-CoV-2 viral copies this assay can detect is 138 copies/mL. A negative result does not preclude SARS-Cov-2 infection and should not be used as the sole basis for treatment or other patient management decisions. A negative result may occur with  improper specimen collection/handling, submission of specimen other than nasopharyngeal swab, presence of viral mutation(s) within the areas targeted by this assay, and inadequate number of viral copies(<138 copies/mL). A negative result must be combined with clinical observations, patient history, and  epidemiological information. The expected result is Negative.  Fact Sheet for Patients:  EntrepreneurPulse.com.au  Fact Sheet for Healthcare Providers:  IncredibleEmployment.be  This test is no t yet approved or cleared by the [place] FDA and  has been authorized for detection and/or diagnosis of SARS-CoV-2 by FDA under an Emergency Use Authorization (EUA). This EUA will remain  in effect (meaning this test can be used) for the duration of the COVID-19 declaration under Section 564(b)(1) of the Act, 21 U.S.C.section 360bbb-3(b)(1), unless the authorization is terminated  or revoked sooner.       Influenza A by PCR NEGATIVE NEGATIVE Final   Influenza B by PCR NEGATIVE NEGATIVE Final    Comment: (NOTE) The Xpert Xpress SARS-CoV-2/FLU/RSV plus assay is intended as an aid in the diagnosis of influenza from Nasopharyngeal swab specimens and should not be used as a sole basis for treatment. Nasal washings and aspirates are unacceptable for Xpert Xpress SARS-CoV-2/FLU/RSV testing.  Fact Sheet for Patients: EntrepreneurPulse.com.au  Fact Sheet for Healthcare Providers: IncredibleEmployment.be  This test is not yet approved or cleared by the [place] FDA and has been authorized for detection and/or diagnosis of SARS-CoV-2 by FDA under an Emergency Use Authorization (EUA). This EUA will remain in effect (meaning this test can be used) for the duration of the COVID-19 declaration under Section 564(b)(1) of the Act, 21 U.S.C. section 360bbb-3(b)(1), unless the authorization is terminated or revoked.  Performed at [facility], [place] [address] [place], [place] [address]   Culture, blood (routine x 2)     Status: None (Preliminary result)   Collection Time: 07/07/20  4:26 AM   Specimen: BLOOD RIGHT HAND  Result Value Ref Range Status   Specimen Description BLOOD RIGHT HAND  Final   Special  Requests   Final    BOTTLES DRAWN AEROBIC AND ANAEROBIC Blood Culture adequate volume   Culture   Final    NO GROWTH 3 DAYS Performed at [facility], [place] [address], [place], [place] [address]    Report Status PENDING  Incomplete  Aerobic/Anaerobic Culture (surgical/deep wound)     Status: None (Preliminary result)   Collection Time: 07/07/20  3:57 PM   Specimen: Abscess  Result Value Ref Range Status   Specimen Description ABSCESS  Final   Special Requests DRAIN  Final   Gram Stain   Final    FEW WBC PRESENT,BOTH PMN AND MONONUCLEAR NO ORGANISMS SEEN    Culture   Final    NO GROWTH 3 DAYS NO ANAEROBES ISOLATED; CULTURE IN PROGRESS FOR 5 DAYS Performed at [facility], [address], [place], [place] [address]    Report Status PENDING  Incomplete  Radiology Studies: No results found.      Scheduled Meds: . amLODipine  5 mg Oral Daily  . atorvastatin  20 mg Oral Daily  . enoxaparin (LOVENOX) injection  40 mg Subcutaneous Q24H  . insulin aspart  0-9 Units Subcutaneous TID WC  . insulin detemir  8 Units Subcutaneous BID  . pantoprazole  40 mg Oral Daily  . senna-docusate  2 tablet Oral QHS  . sodium chloride flush  5 mL Intracatheter Q8H   Continuous Infusions: . piperacillin-tazobactam (ZOSYN)  IV 3.375 g (07/10/20 1255)     LOS: 4 days     Dana Allan, MD [facility] 07/10/2020, 5:27 PM  If 7PM-7AM, please contact night-coverage [email or web address]

## 2020-07-10 NOTE — Progress Notes (Signed)
Physical Therapy Treatment Patient Details Name: DEMECIA Washington MRN: [id number] DOB: 02/06/1941 Today's Date: 07/10/2020    History of Present Illness Pt is an 80 yo female presenting 5/10 from PCP due to continued abdominal pain, nausea, vomiting, and weakness after gallbladder removal on 5/5.  Upon workup, pt found to have gallbladder fossa abcess, started on antibiotics. s/p drain placement 5/11. PMH includes: CKD, DM II, HTN, and stroke.    PT Comments    Pt admitted with above diagnosis. Pt was able to ambulate with RW with good stability overall. Progressed distance and less pain as well.  Able to ascend and descend steps as well.  Continue PT.  Pt currently with functional limitations due to balance and endurance deficits. Pt will benefit from skilled PT to increase their independence and safety with mobility to allow discharge to the venue listed below.     Follow Up Recommendations  Home health PT;Supervision for mobility/OOB     Equipment Recommendations  Rolling walker with 5" wheels;3in1 (PT)    Recommendations for Other Services       Precautions / Restrictions Precautions Precautions: Fall Precaution Comments: x1 fall, percutaneous drain right side Restrictions Weight Bearing Restrictions: No    Mobility  Bed Mobility               General bed mobility comments: Pt in chair on arrival.    Transfers Overall transfer level: Needs assistance Equipment used: Rolling walker (2 wheeled) Transfers: Sit to/from Stand Sit to Stand: Min guard         General transfer comment: Cues for hand placement and safety to back entirely to seated surface before sitting.  Ambulation/Gait Ambulation/Gait assistance: Min guard Gait Distance (Feet): 250 Feet Assistive device: Rolling walker (2 wheeled) Gait Pattern/deviations: Decreased stride length;Shuffle;Step-to pattern;Trunk flexed Gait velocity: decreased Gait velocity interpretation: <1.31 ft/sec,  indicative of household ambulator General Gait Details: Cues for scap retraction and upper trunk control.  Cues to keep RW close to her person with turns or backing.   Stairs Stairs: Yes Stairs assistance: Min guard Stair Management: No rails;Step to pattern;Backwards;With walker Number of Stairs: 4 General stair comments: Pt able to demonstrate good technique on stairs.  Gave handout   Wheelchair Mobility    Modified Rankin (Stroke Patients Only)       Balance Overall balance assessment: No apparent balance deficits (not formally assessed)                                          Cognition Arousal/Alertness: Awake/alert Behavior During Therapy: WFL for tasks assessed/performed Overall Cognitive Status: Within Functional Limits for tasks assessed                                        Exercises General Exercises - Lower Extremity Ankle Circles/Pumps: AROM;Both;10 reps;Seated Long Arc Quad: AROM;Both;10 reps;Seated    General Comments        Pertinent Vitals/Pain Pain Assessment: Faces Faces Pain Scale: Hurts little more Pain Location: R abdomen, near drain Pain Descriptors / Indicators: Discomfort Pain Intervention(s): Limited activity within patient's tolerance;Monitored during session;Repositioned;Premedicated before session    Home Living                      Prior Function  PT Goals (current goals can now be found in the care plan section) Acute Rehab PT Goals Patient Stated Goal: to go home Progress towards PT goals: Progressing toward goals    Frequency    Min 3X/week      PT Plan Current plan remains appropriate;Frequency needs to be updated    Co-evaluation              AM-PAC PT "6 Clicks" Mobility   Outcome Measure  Help needed turning from your back to your side while in a flat bed without using bedrails?: A Little Help needed moving from lying on your back to sitting on the  side of a flat bed without using bedrails?: A Little Help needed moving to and from a bed to a chair (including a wheelchair)?: A Little Help needed standing up from a chair using your arms (e.g., wheelchair or bedside chair)?: A Little Help needed to walk in hospital room?: A Little Help needed climbing 3-5 steps with a railing? : A Little 6 Click Score: 18    End of Session Equipment Utilized During Treatment: Gait belt Activity Tolerance: Patient tolerated treatment well Patient left: in chair;with call bell/phone within reach;with family/visitor present Nurse Communication: Mobility status PT Visit Diagnosis: Other abnormalities of gait and mobility (R26.89);Muscle weakness (generalized) (M62.81);Pain Pain - Right/Left: Right Pain - part of body:  (abdomen)     Time: 2426-8341 PT Time Calculation (min) (ACUTE ONLY): 32 min  Charges:  $Gait Training: 8-22 mins $Self Care/Home Management: 8-22                     [facility] M,PT Acute Rehab Services [phone] (pager)   Sandra Washington 07/10/2020, 2:15 PM

## 2020-07-10 NOTE — Progress Notes (Signed)
   07/10/20 1100   PT - Assessment/Plan  Follow Up Recommendations Home health PT;Supervision for mobility/OOB  PT equipment Rolling walker with 5" wheels;3in1 (PT)  PT treatment completed.  Full note to follow. Pt will need f/u and equipment as noted above. Thanks.  Alphonso Gregson M,PT Acute Rehab Services [phone] (pager)

## 2020-07-11 ENCOUNTER — Inpatient Hospital Stay (HOSPITAL_COMMUNITY): Payer: Medicare HMO

## 2020-07-11 DIAGNOSIS — K651 Peritoneal abscess: Secondary | ICD-10-CM

## 2020-07-11 DIAGNOSIS — Z9049 Acquired absence of other specified parts of digestive tract: Secondary | ICD-10-CM

## 2020-07-11 DIAGNOSIS — E785 Hyperlipidemia, unspecified: Secondary | ICD-10-CM

## 2020-07-11 DIAGNOSIS — R7881 Bacteremia: Secondary | ICD-10-CM

## 2020-07-11 DIAGNOSIS — I10 Essential (primary) hypertension: Secondary | ICD-10-CM

## 2020-07-11 DIAGNOSIS — E1169 Type 2 diabetes mellitus with other specified complication: Secondary | ICD-10-CM

## 2020-07-11 LAB — ECHOCARDIOGRAM COMPLETE
AR max vel: 1.47 cm2
AV Area VTI: 1.5 cm2
AV Area mean vel: 1.51 cm2
AV Mean grad: 9 mmHg
AV Peak grad: 18.7 mmHg
Ao pk vel: 2.16 m/s
Area-P 1/2: 3.89 cm2
S' Lateral: 2.9 cm
Weight: 2994.73 oz

## 2020-07-11 LAB — GLUCOSE, CAPILLARY
Glucose-Capillary: 111 mg/dL — ABNORMAL HIGH (ref 70–99)
Glucose-Capillary: 161 mg/dL — ABNORMAL HIGH (ref 70–99)
Glucose-Capillary: 190 mg/dL — ABNORMAL HIGH (ref 70–99)
Glucose-Capillary: 171 mg/dL — ABNORMAL HIGH (ref 70–99)

## 2020-07-11 NOTE — Progress Notes (Signed)
  Echocardiogram 2D Echocardiogram has been performed.  Merrie Roof F 07/11/2020, 4:57 PM

## 2020-07-11 NOTE — Plan of Care (Signed)
  Problem: Education: Goal: Knowledge of General Education information will improve Description: Including pain rating scale, medication(s)/side effects and non-pharmacologic comfort measures Outcome: Progressing   Problem: Clinical Measurements: Goal: Respiratory complications will improve Outcome: Progressing   Problem: Activity: Goal: Risk for activity intolerance will decrease Outcome: Progressing   Problem: Nutrition: Goal: Adequate nutrition will be maintained Outcome: Progressing   Problem: Coping: Goal: Level of anxiety will decrease Outcome: Progressing   Problem: Elimination: Goal: Will not experience complications related to bowel motility Outcome: Progressing Goal: Will not experience complications related to urinary retention Outcome: Progressing   Problem: Pain Managment: Goal: General experience of comfort will improve Outcome: Progressing   Problem: Safety: Goal: Ability to remain free from injury will improve Outcome: Progressing

## 2020-07-11 NOTE — Consult Note (Signed)
Date of Admission:  07/06/2020          Reason for Consult: Postoperative abdominal abscess in gallbladder fossa    Referring Provider: Dr. Marthenia Rolling   Assessment:  1. Postoperative abdominal abscess in gallbladder fossa ater  2. Laparoscopic cholecystectomy performed at [facility] 3. Bacillus in 1/2 blood cultures 4. Diabetes mellitus 5. Hyperlipidemia 6. Hypertension   Plan:  1. Continue Zosyn 2. Follow-up cultures from gallbladder fossa abscess 3. Monitor drain output 4. Philippa Chester in the microbiology lab will set the bacillus species up for identification 5. TTE  Principal Problem:   Intra-abdominal abscess (HCC) Active Problems:   Hypertension, essential   Type 2 diabetes mellitus with hyperlipidemia (HCC)   Scheduled Meds: . amLODipine  5 mg Oral Daily  . atorvastatin  20 mg Oral Daily  . enoxaparin (LOVENOX) injection  40 mg Subcutaneous Q24H  . insulin aspart  0-9 Units Subcutaneous TID WC  . insulin detemir  8 Units Subcutaneous BID  . pantoprazole  40 mg Oral Daily  . senna-docusate  2 tablet Oral QHS  . sodium chloride flush  5 mL Intracatheter Q8H   Continuous Infusions: . piperacillin-tazobactam (ZOSYN)  IV Stopped (07/11/20 1024)   PRN Meds:.acetaminophen, morphine injection, oxyCODONE, prochlorperazine  HPI: Sandra Washington is a 80 y.o. female with a history of hypertension hyperlipidemia diabetes type 2 GERD who had recent laparoscopic cholecystectomy performed at [facility] on 01 Jul 2020.  She had been on IV antibiotics while there then was discharged.  She presented to PCP with severe right upper quadrant pain with nausea vomiting diarrhea chills and fevers.  CT of the abdomen pelvis showed a 7.6 x 6.7 cm gallbladder fossa abscess.  Surgery were consulted and interventional radiology also have become involved and have placed a drain into the abscess with return of bloody bilious material.  Cultures from the abscess are still incubating.   One of her 2 blood cultures from the admission is growing a bacillus species which is not yet been identified.  Is improved dramatically on IV Zosyn.  I would continue Zosyn at present.  I will ask the microbiology lab to identify the bacillus species.  Perhaps it is Bacillus cereus or some other organism that could be related to her intra-abdominal infection.  I will also order a 2D echocardiogram.  She will need to continue to have the drain output monitored and will need reimaging prior to removal of the drain and prior to stopping antibiotics.   Our team will followup tomorrow.  I spent greater than 80 minutes with the patient including greater than 50% of time in face to face counsel of the patient, with time spent personally reading her radiographs, going through her microbiological and laboratory data and in coordination of her care with microbiology lab and the primary team.    Review of Systems: Review of Systems  Constitutional: Positive for chills, diaphoresis, fever and malaise/fatigue. Negative for weight loss.  HENT: Negative for congestion, hearing loss, sore throat and tinnitus.   Eyes: Negative for blurred vision and double vision.  Respiratory: Negative for cough, sputum production, shortness of breath and wheezing.   Cardiovascular: Negative for chest pain, palpitations and leg swelling.  Gastrointestinal: Positive for abdominal pain. Negative for blood in stool, constipation, diarrhea, heartburn, melena, nausea and vomiting.  Genitourinary: Negative for dysuria, flank pain and hematuria.  Musculoskeletal: Negative for back pain, falls, joint pain and myalgias.  Skin: Negative for itching and rash.  Neurological: Negative for dizziness, sensory change, focal weakness, loss of consciousness, weakness and headaches.  Endo/Heme/Allergies: Does not bruise/bleed easily.  Psychiatric/Behavioral: Negative for depression, memory loss and suicidal ideas. The patient is not  nervous/anxious.    Drain in place with bilious material Past Medical History:  Diagnosis Date  . Chronic kidney disease    kidney stone  . Diabetes mellitus without complication ([place])   . Hypertension   . Stroke [facility])     Social History   Tobacco Use  . Smoking status: Former Research scientist (life sciences)  . Smokeless tobacco: Never Used  Substance Use Topics  . Alcohol use: No  . Drug use: No    History reviewed. No pertinent family history. No Known Allergies  OBJECTIVE: Blood pressure 127/60, pulse 60, temperature 97.7 F (36.5 C), temperature source Oral, resp. rate 17, weight 84.9 kg, SpO2 96 %.  Physical Exam Constitutional:      General: She is not in acute distress.    Appearance: Normal appearance. She is well-developed. She is not ill-appearing or diaphoretic.  HENT:     Head: Normocephalic and atraumatic.     Right Ear: Hearing and external ear normal.     Left Ear: Hearing and external ear normal.     Nose: No nasal deformity or rhinorrhea.  Eyes:     General: No scleral icterus.    Conjunctiva/sclera: Conjunctivae normal.     Right eye: Right conjunctiva is not injected.     Left eye: Left conjunctiva is not injected.     Pupils: Pupils are equal, round, and reactive to light.  Neck:     Vascular: No JVD.  Cardiovascular:     Rate and Rhythm: Normal rate and regular rhythm.     Heart sounds: Normal heart sounds, S1 normal and S2 normal. No murmur heard. No friction rub. No gallop.   Pulmonary:     Effort: Pulmonary effort is normal. No respiratory distress.     Breath sounds: Normal breath sounds. No stridor. No wheezing, rhonchi or rales.  Abdominal:     General: Bowel sounds are normal. There is no distension.     Palpations: Abdomen is soft.     Tenderness: There is abdominal tenderness in the right lower quadrant.     Comments: Surgical scars are clean  Musculoskeletal:        General: Normal range of motion.     Right shoulder: Normal.     Left shoulder:  Normal.     Cervical back: Normal range of motion and neck supple.     Right hip: Normal.     Left hip: Normal.     Right knee: Normal.     Left knee: Normal.  Lymphadenopathy:     Head:     Right side of head: No submandibular, preauricular or posterior auricular adenopathy.     Left side of head: No submandibular, preauricular or posterior auricular adenopathy.     Cervical: No cervical adenopathy.     Right cervical: No superficial or deep cervical adenopathy.    Left cervical: No superficial or deep cervical adenopathy.  Skin:    General: Skin is warm and dry.     Coloration: Skin is not pale.     Findings: No abrasion, bruising, ecchymosis, erythema, lesion or rash.     Nails: There is no clubbing.  Neurological:     Mental Status: She is alert and oriented to person, place, and time.     Sensory: No sensory  deficit.     Coordination: Coordination normal.     Gait: Gait normal.  Psychiatric:        Attention and Perception: She is attentive.        Speech: Speech normal.        Behavior: Behavior normal. Behavior is cooperative.        Thought Content: Thought content normal.        Judgment: Judgment normal.     Lab Results Lab Results  Component Value Date   WBC 8.8 07/08/2020   HGB 10.4 (L) 07/08/2020   HCT 33.3 (L) 07/08/2020   MCV 86.0 07/08/2020   PLT 376 07/08/2020    Lab Results  Component Value Date   CREATININE 0.78 07/08/2020   BUN 7 (L) 07/08/2020   NA 135 07/08/2020   K 3.9 07/08/2020   CL 102 07/08/2020   CO2 24 07/08/2020    Lab Results  Component Value Date   ALT 16 07/08/2020   AST 14 (L) 07/08/2020   ALKPHOS 61 07/08/2020   BILITOT 0.5 07/08/2020     Microbiology: Recent Results (from the past 240 hour(s))  Culture, blood (routine x 2)     Status: Abnormal   Collection Time: 07/06/20 12:33 AM   Specimen: BLOOD  Result Value Ref Range Status   Specimen Description BLOOD SITE NOT SPECIFIED  Final   Special Requests   Final     BOTTLES DRAWN AEROBIC AND ANAEROBIC Blood Culture adequate volume   Culture  Setup Time   Final    GRAM POSITIVE RODS AEROBIC BOTTLE ONLY CRITICAL RESULT CALLED TO, READ BACK BY AND VERIFIED WITH: PHARM D M.BITONTI ON [id number] AT 1610 BY E.PARRISH    Culture (A)  Final    BACILLUS SPECIES Standardized susceptibility testing for this organism is not available. Performed at [facility], [place] [address], [place], [place] [address]    Report Status 07/08/2020 FINAL  Final  Resp Panel by RT-PCR (Flu A&B, Covid) Nasopharyngeal Swab     Status: None   Collection Time: 07/06/20 11:53 PM   Specimen: Nasopharyngeal Swab; Nasopharyngeal(NP) swabs in vial transport medium  Result Value Ref Range Status   SARS Coronavirus 2 by RT PCR NEGATIVE NEGATIVE Final    Comment: (NOTE) SARS-CoV-2 target nucleic acids are NOT DETECTED.  The SARS-CoV-2 RNA is generally detectable in upper respiratory specimens during the acute phase of infection. The lowest concentration of SARS-CoV-2 viral copies this assay can detect is 138 copies/mL. A negative result does not preclude SARS-Cov-2 infection and should not be used as the sole basis for treatment or other patient management decisions. A negative result may occur with  improper specimen collection/handling, submission of specimen other than nasopharyngeal swab, presence of viral mutation(s) within the areas targeted by this assay, and inadequate number of viral copies(<138 copies/mL). A negative result must be combined with clinical observations, patient history, and epidemiological information. The expected result is Negative.  Fact Sheet for Patients:  EntrepreneurPulse.com.au  Fact Sheet for Healthcare Providers:  IncredibleEmployment.be  This test is no t yet approved or cleared by the [place] FDA and  has been authorized for detection and/or diagnosis of SARS-CoV-2 by FDA under an Emergency Use  Authorization (EUA). This EUA will remain  in effect (meaning this test can be used) for the duration of the COVID-19 declaration under Section 564(b)(1) of the Act, 21 U.S.C.section 360bbb-3(b)(1), unless the authorization is terminated  or revoked sooner.  Influenza A by PCR NEGATIVE NEGATIVE Final   Influenza B by PCR NEGATIVE NEGATIVE Final    Comment: (NOTE) The Xpert Xpress SARS-CoV-2/FLU/RSV plus assay is intended as an aid in the diagnosis of influenza from Nasopharyngeal swab specimens and should not be used as a sole basis for treatment. Nasal washings and aspirates are unacceptable for Xpert Xpress SARS-CoV-2/FLU/RSV testing.  Fact Sheet for Patients: EntrepreneurPulse.com.au  Fact Sheet for Healthcare Providers: IncredibleEmployment.be  This test is not yet approved or cleared by the [place] FDA and has been authorized for detection and/or diagnosis of SARS-CoV-2 by FDA under an Emergency Use Authorization (EUA). This EUA will remain in effect (meaning this test can be used) for the duration of the COVID-19 declaration under Section 564(b)(1) of the Act, 21 U.S.C. section 360bbb-3(b)(1), unless the authorization is terminated or revoked.  Performed at [facility], [place] [address], [place], [place] [address]   Culture, blood (routine x 2)     Status: None (Preliminary result)   Collection Time: 07/07/20  4:26 AM   Specimen: BLOOD RIGHT HAND  Result Value Ref Range Status   Specimen Description BLOOD RIGHT HAND  Final   Special Requests   Final    BOTTLES DRAWN AEROBIC AND ANAEROBIC Blood Culture adequate volume   Culture   Final    NO GROWTH 4 DAYS Performed at [facility], [place] [address] [place], [place] [address]    Report Status PENDING  Incomplete  Aerobic/Anaerobic Culture (surgical/deep wound)     Status: None (Preliminary result)   Collection Time: 07/07/20  3:57 PM   Specimen: Abscess   Result Value Ref Range Status   Specimen Description ABSCESS  Final   Special Requests DRAIN  Final   Gram Stain   Final    FEW WBC PRESENT,BOTH PMN AND MONONUCLEAR NO ORGANISMS SEEN    Culture   Final    NO GROWTH 3 DAYS NO ANAEROBES ISOLATED; CULTURE IN PROGRESS FOR 5 DAYS Performed at [facility], [address], [place], [place] [address]    Report Status PENDING  Incomplete    Alcide Evener, [place] [phone] pager  07/11/2020, 12:44 PM

## 2020-07-11 NOTE — Progress Notes (Addendum)
Occupational Therapy Treatment Patient Details Name: Sandra Washington MRN: [id number] DOB: 1940/03/17 Today's Date: 07/11/2020    History of present illness Pt is an 80 yo female presenting 5/10 from PCP due to continued abdominal pain, nausea, vomiting, and weakness after gallbladder removal on 5/5.  Upon workup, pt found to have gallbladder fossa abcess, started on antibiotics. s/p drain placement 5/11. PMH includes: CKD, DM II, HTN, and stroke.   OT comments  Pt making excellent progress towards OT goals this session. Pt completing functional mobility greater than a household distance with RW and min guard assist, as well as standing ADLs at sink ~ 10 mins for UB/ LB bathing tasks. Pt needed increased assist donning RLE into briefs this session d/t increased pain when bending forward. Pt additionally completing toilet transfer/ hygiene with min guard - supervision assist. DC plan remains appropriate, will follow acutely per POC.   pt with increased WOB however pt on RA with SpO2 99% and HR 111-112 bpm.   Follow Up Recommendations  No OT follow up;Supervision - Intermittent    Equipment Recommendations  3 in 1 bedside commode    Recommendations for Other Services      Precautions / Restrictions Precautions Precautions: Fall Precaution Comments: x1 fall, percutaneous drain right side Restrictions Weight Bearing Restrictions: No       Mobility Bed Mobility Overal bed mobility: Needs Assistance Bed Mobility: Supine to Sit     Supine to sit: Supervision;HOB elevated     General bed mobility comments: supervision for safety and line mgmt, no physical assist needed    Transfers Overall transfer level: Needs assistance Equipment used: Rolling walker (2 wheeled) Transfers: Sit to/from Stand Sit to Stand: Min guard         General transfer comment: min guard to rise from EOB and BSC multiple times    Balance Overall balance assessment: Needs  assistance Sitting-balance support: No upper extremity supported;Feet supported Sitting balance-Leahy Scale: Good     Standing balance support: No upper extremity supported;During functional activity Standing balance-Leahy Scale: Good Standing balance comment: standing at sink ~ 10 mins for bathing tasks with no UE support                           ADL either performed or assessed with clinical judgement   ADL Overall ADL's : At baseline;Needs assistance/impaired     Grooming: Wash/dry face;Wash/dry hands;Standing;Supervision/safety Grooming Details (indicate cue type and reason): standing at sink Upper Body Bathing: Moderate assistance;Standing Upper Body Bathing Details (indicate cue type and reason): to wash back Lower Body Bathing: Moderate assistance;Sit to/from stand Lower Body Bathing Details (indicate cue type and reason): to wash her feet, pt able to reach everythign else in standing Upper Body Dressing : Minimal assistance;Sitting Upper Body Dressing Details (indicate cue type and reason): to don new gown Lower Body Dressing: Total assistance;Sitting/lateral leans;Moderate assistance Lower Body Dressing Details (indicate cue type and reason): total A to don new socks, MOD A to don new brief with pt needing assist to thread RLE into brief Toilet Transfer: Min Lobbyist Details (indicate cue type and reason): from EOB into bathroom Toileting- Clothing Manipulation and Hygiene: Supervision/safety;Set up;Sit to/from stand Toileting - Clothing Manipulation Details (indicate cue type and reason): set- up of toilet paper with pt able to complete posterior/ anterior pericare via sit<>stand   Tub/Shower Transfer Details (indicate cue type and reason): pt reports she has shower seat at home Functional  mobility during ADLs: Min guard;Rolling walker General ADL Comments: Pt performing toilet hygiene, UB/LB bathing at sink, functional mobility greater than a  household distance and UB/LB dressing.     Vision       Perception     Praxis      Cognition Arousal/Alertness: Awake/alert Behavior During Therapy: WFL for tasks assessed/performed Overall Cognitive Status: Within Functional Limits for tasks assessed                                          Exercises     Shoulder Instructions       General Comments pt with increased WOB however pt on RA with SpO2 99% and HR 111-112 bpm. issued pt energy conservation handout and provided education on strategies for home with pt verbalizing understanding.     Pertinent Vitals/ Pain       Pain Assessment: Faces Faces Pain Scale: Hurts a little bit Pain Location: R abdomen, near drain Pain Descriptors / Indicators: Discomfort;Grimacing Pain Intervention(s): Monitored during session;Repositioned  Home Living                                          Prior Functioning/Environment              Frequency  Min 2X/week        Progress Toward Goals  OT Goals(current goals can now be found in the care plan section)  Progress towards OT goals: Progressing toward goals  Acute Rehab OT Goals Patient Stated Goal: to go home OT Goal Formulation: With patient Time For Goal Achievement: 07/21/20 Potential to Achieve Goals: Good  Plan Discharge plan remains appropriate;Frequency remains appropriate    Co-evaluation                 AM-PAC OT "6 Clicks" Daily Activity     Outcome Measure   Help from another person eating meals?: None Help from another person taking care of personal grooming?: None Help from another person toileting, which includes using toliet, bedpan, or urinal?: A Little Help from another person bathing (including washing, rinsing, drying)?: A Little Help from another person to put on and taking off regular upper body clothing?: A Little Help from another person to put on and taking off regular lower body clothing?: A  Little 6 Click Score: 20    End of Session Equipment Utilized During Treatment: Rolling walker      Activity Tolerance Patient tolerated treatment well   Patient Left in chair;with call bell/phone within reach   Nurse Communication Mobility status        Time: 3818-2993 OT Time Calculation (min): 43 min  Charges: OT General Charges $OT Visit: 1 Visit OT Treatments $Self Care/Home Management : 38-52 mins Harley Alto., COTA/L Acute Rehabilitation Services [phone]    Precious Haws 07/11/2020, 2:12 PM

## 2020-07-11 NOTE — Progress Notes (Signed)
PROGRESS NOTE    Sandra Washington  [id number] DOB: 03-29-1940 DOA: 07/06/2020 PCP: Lucianne Lei, MD   Brief Narrative: Sandra Washington is a 80 y.o. female with history of hypertension, hyperlipidemia, diabetes mellitus type 2, GERD.  Patient presents secondary to right upper quadrant pain, nausea, vomiting and found to have a large intra-abdominal abscess located in her gallbladder fossa.  General surgery and interventional radiology consulted on admission.  Plan for percutaneous drain placement.  IV antibiotics started on admission.  07/09/2020: Patient seen alongside patient's nurse.  Gallbladder fossa drain is still draining some fluid.  Blood cultures finalized.  Patient is on antibiotics.  We will have low threshold to consult infectious disease to advise duration of antibiotics. 07/10/2020: Patient seen.  No new changes.  We will consult infectious disease. 07/11/2020: Patient seen.  Infectious disease input is appreciated.  For transthoracic echocardiogram.  For identification of bacillus species as per ID team.  Infectious disease team has advised continuing IV Zosyn for now.  Assessment & Plan:   Principal Problem:   Intra-abdominal abscess ([place]) Active Problems:   Hypertension, essential   Type 2 diabetes mellitus with hyperlipidemia ([facility])   Intra-abdominal abscess vs bile leak CT abdomen/pelvis suggests large abscess related to recent laparoscopic cholecystectomy. General surgery consulted on admission along with IR. Zosyn IV initiated empirically. Blood cultures obtained on admission.  Drain placed by IR on 5/11 with initial description of bilious fluid.  HIDA scan without suggestion of bile leak. -Continue Zosyn IV; follow-up blood cultures -General surgery recommendations: Signed off 07/09/2020: HIDA scan is negative for bile leak.  Patient is on IV antibiotics. 07/11/2020: Infectious disease team input is appreciated.  Continue IV Zosyn.  Echocardiogram.   Further identification of bacillus species by the microbiology team.  Likely reimaging prior to discontinuation of the drain.  Infectious disease to assist in directing patient's care.  Intractable nausea/vomiting/diarrhea Symptoms appear to be improved. IV fluids started.  Improved. -Continue compazine prn -Advance diet as tolerated 07/09/2020: Resolved significantly.  Diabetes mellitus, type 2 Last hemoglobin A1c of 6.4% from March 2022.  Patient is managed as an outpatient on insulin NPH 8 units in a.m. and 12 units in the evening in addition to insulin regular 8 units with breakfast and lunch in addition to 16 units with dinner.  On admission, patient started on sliding scale. -Continue Levemir 8 units twice daily while inpatient and continue sliding scale insulin  Primary hypertension Patient is on valsartan hydrochlorothiazide as an outpatient.  Blood pressure is slightly uncontrolled.  Patient was started on amlodipine 5 mg while inpatient. -Continue amlodipine for now but will transition to valsartan-hydrochlorothiazide once creatinine/fluid status has stabilized.  GERD -Continue Protonix   DVT prophylaxis: SCDs Code Status:   Code Status: Full Code Family Communication: None at bedside Disposition Plan: Anticipate discharge likely home in 1 to 3 days pending PT recommendations, culture data   Consultants:   General surgery  Interventional radiology  Procedures:   CT GUIDED PLACEMENT OF A 12 FR DRAIN CATHETER (07/07/2020)  Antimicrobials:  Zosyn IV   Subjective: No new complaints. No fever or chills.    Objective: Vitals:   07/10/20 2231 07/11/20 0612 07/11/20 0722 07/11/20 1513  BP: 140/61 129/60 127/60 (!) 131/51  Pulse: 69 64 60 67  Resp: 16 16 17 16   Temp: 99.7 F (37.6 C) 98 F (36.7 C) 97.7 F (36.5 C) 98 F (36.7 C)  TempSrc: Oral Oral Oral Oral  SpO2: 98% 99% 96% 100%  Weight:  84.9 kg      Intake/Output Summary (Last 24 hours) at 07/11/2020  1753 Last data filed at 07/11/2020 1415 Gross per 24 hour  Intake 409.65 ml  Output 515 ml  Net -105.35 ml   Filed Weights   07/09/20 0422 07/11/20 0612  Weight: 88.8 kg 84.9 kg    Examination:  General exam: Appears calm and comfortable Respiratory system: Clear to auscultation. Respiratory effort normal. Cardiovascular system: S1 & S2 heard, RRR. No murmurs, rubs, gallops or clicks. Gastrointestinal system: Abdomen is nondistended, soft and mildly tender in right upper quadrant. No organomegaly or masses felt. Normal bowel sounds heard. Central nervous system: Alert and oriented. No focal neurological deficits. Musculoskeletal: No edema. No calf tenderness Skin: No cyanosis. No rashes Psychiatry: Judgement and insight appear normal. Mood & affect appropriate.    Data Reviewed: I have personally reviewed following labs and imaging studies  CBC Lab Results  Component Value Date   WBC 8.8 07/08/2020   RBC 3.87 07/08/2020   HGB 10.4 (L) 07/08/2020   HCT 33.3 (L) 07/08/2020   MCV 86.0 07/08/2020   MCH 26.9 07/08/2020   PLT 376 07/08/2020   MCHC 31.2 07/08/2020   RDW 14.1 07/08/2020   LYMPHSABS 2.4 07/06/2020   MONOABS 1.2 (H) 07/06/2020   EOSABS 0.1 07/06/2020   BASOSABS 0.1 00/93/8182     Last metabolic panel Lab Results  Component Value Date   NA 135 07/08/2020   K 3.9 07/08/2020   CL 102 07/08/2020   CO2 24 07/08/2020   BUN 7 (L) 07/08/2020   CREATININE 0.78 07/08/2020   GLUCOSE 151 (H) 07/08/2020   GFRNONAA >60 07/08/2020   GFRAA >90 04/09/2012   CALCIUM 8.7 (L) 07/08/2020   PHOS 3.8 07/07/2020   PROT 5.1 (L) 07/08/2020   ALBUMIN 2.3 (L) 07/08/2020   BILITOT 0.5 07/08/2020   ALKPHOS 61 07/08/2020   AST 14 (L) 07/08/2020   ALT 16 07/08/2020   ANIONGAP 9 07/08/2020    CBG (last 3)  Recent Labs    07/11/20 0633 07/11/20 1106 07/11/20 1610  GLUCAP 111* 161* 190*     GFR: Estimated Creatinine Clearance: 60.5 mL/min (by C-G formula based on SCr  of 0.78 mg/dL).  Coagulation Profile: Recent Labs  Lab 07/07/20 1336  INR 1.1    Recent Results (from the past 240 hour(s))  Culture, blood (routine x 2)     Status: Abnormal (Preliminary result)   Collection Time: 07/06/20 12:33 AM   Specimen: BLOOD  Result Value Ref Range Status   Specimen Description BLOOD SITE NOT SPECIFIED  Final   Special Requests   Final    BOTTLES DRAWN AEROBIC AND ANAEROBIC Blood Culture adequate volume   Culture  Setup Time   Final    GRAM POSITIVE RODS AEROBIC BOTTLE ONLY CRITICAL RESULT CALLED TO, READ BACK BY AND VERIFIED WITH: PHARM D M.BITONTI ON [id number] AT 1610 BY E.PARRISH    Culture (A)  Final    BACILLUS SPECIES Standardized susceptibility testing for this organism is not available. Performed at [facility], [place] [address] [place], [place] [address]    Report Status PENDING  Incomplete  Resp Panel by RT-PCR (Flu A&B, Covid) Nasopharyngeal Swab     Status: None   Collection Time: 07/06/20 11:53 PM   Specimen: Nasopharyngeal Swab; Nasopharyngeal(NP) swabs in vial transport medium  Result Value Ref Range Status   SARS Coronavirus 2 by RT PCR NEGATIVE NEGATIVE Final    Comment: (NOTE)  SARS-CoV-2 target nucleic acids are NOT DETECTED.  The SARS-CoV-2 RNA is generally detectable in upper respiratory specimens during the acute phase of infection. The lowest concentration of SARS-CoV-2 viral copies this assay can detect is 138 copies/mL. A negative result does not preclude SARS-Cov-2 infection and should not be used as the sole basis for treatment or other patient management decisions. A negative result may occur with  improper specimen collection/handling, submission of specimen other than nasopharyngeal swab, presence of viral mutation(s) within the areas targeted by this assay, and inadequate number of viral copies(<138 copies/mL). A negative result must be combined with clinical observations, patient history, and  epidemiological information. The expected result is Negative.  Fact Sheet for Patients:  EntrepreneurPulse.com.au  Fact Sheet for Healthcare Providers:  IncredibleEmployment.be  This test is no t yet approved or cleared by the [place] FDA and  has been authorized for detection and/or diagnosis of SARS-CoV-2 by FDA under an Emergency Use Authorization (EUA). This EUA will remain  in effect (meaning this test can be used) for the duration of the COVID-19 declaration under Section 564(b)(1) of the Act, 21 U.S.C.section 360bbb-3(b)(1), unless the authorization is terminated  or revoked sooner.       Influenza A by PCR NEGATIVE NEGATIVE Final   Influenza B by PCR NEGATIVE NEGATIVE Final    Comment: (NOTE) The Xpert Xpress SARS-CoV-2/FLU/RSV plus assay is intended as an aid in the diagnosis of influenza from Nasopharyngeal swab specimens and should not be used as a sole basis for treatment. Nasal washings and aspirates are unacceptable for Xpert Xpress SARS-CoV-2/FLU/RSV testing.  Fact Sheet for Patients: EntrepreneurPulse.com.au  Fact Sheet for Healthcare Providers: IncredibleEmployment.be  This test is not yet approved or cleared by the [place] FDA and has been authorized for detection and/or diagnosis of SARS-CoV-2 by FDA under an Emergency Use Authorization (EUA). This EUA will remain in effect (meaning this test can be used) for the duration of the COVID-19 declaration under Section 564(b)(1) of the Act, 21 U.S.C. section 360bbb-3(b)(1), unless the authorization is terminated or revoked.  Performed at [facility], [place] [address], [place], [place] [address]   Culture, blood (routine x 2)     Status: None (Preliminary result)   Collection Time: 07/07/20  4:26 AM   Specimen: BLOOD RIGHT HAND  Result Value Ref Range Status   Specimen Description BLOOD RIGHT HAND  Final   Special  Requests   Final    BOTTLES DRAWN AEROBIC AND ANAEROBIC Blood Culture adequate volume   Culture   Final    NO GROWTH 4 DAYS Performed at [facility], [place] [address] [place], [place] [address]    Report Status PENDING  Incomplete  Aerobic/Anaerobic Culture (surgical/deep wound)     Status: None (Preliminary result)   Collection Time: 07/07/20  3:57 PM   Specimen: Abscess  Result Value Ref Range Status   Specimen Description ABSCESS  Final   Special Requests DRAIN  Final   Gram Stain   Final    FEW WBC PRESENT,BOTH PMN AND MONONUCLEAR NO ORGANISMS SEEN    Culture   Final    NO GROWTH 4 DAYS NO ANAEROBES ISOLATED; CULTURE IN PROGRESS FOR 5 DAYS Performed at [facility], [address], [place], [place] [address]    Report Status PENDING  Incomplete        Radiology Studies: ECHOCARDIOGRAM COMPLETE  Result Date: 07/11/2020    ECHOCARDIOGRAM REPORT   Patient Name:  Sharina L BETHEL-JONES Date of Exam: 07/11/2020 Medical Rec #:  [id number]              Height:       66.0 in Accession #:    [id number]             Weight:       187.2 lb Date of Birth:  10/24/1940             BSA:          1.944 m Patient Age:    29 years               BP:           131/51 mmHg Patient Gender: F                      HR:           69 bpm. Exam Location:  Inpatient Procedure: 2D Echo, Cardiac Doppler and Color Doppler Indications:    Bacteremia R 78.81  History:        Patient has no prior history of Echocardiogram examinations.                 Gallbladder surgery.  Sonographer:    Merrie Roof RDCS Referring Phys: [place]  1. Left ventricular ejection fraction, by estimation, is 60 to 65%. The left ventricle has normal function. The left ventricle has no regional wall motion abnormalities. There is mild left ventricular hypertrophy. Left ventricular diastolic parameters are consistent with Grade I diastolic dysfunction (impaired relaxation).  2. Right ventricular  systolic function is normal. The right ventricular size is normal.  3. The mitral valve is abnormal. Significant thickening of anterior leaflet. Trivial mitral valve regurgitation. No evidence of mitral stenosis.  4. The aortic valve is tricuspid. Aortic valve regurgitation is not visualized. Mild aortic valve stenosis. Vmax 2.2 m/s, MG 46mmHg, AVA 1.5 cm^2, DI 0.48. Conclusion(s)/Recommendation(s): No clear vegetation seen, but significant thickening of anterior mitral valve leaflet. Trivial MR. If clinical suspicion for endocarditis, recommend TEE. FINDINGS  Left Ventricle: Left ventricular ejection fraction, by estimation, is 60 to 65%. The left ventricle has normal function. The left ventricle has no regional wall motion abnormalities. The left ventricular internal cavity size was normal in size. There is  mild left ventricular hypertrophy. Left ventricular diastolic parameters are consistent with Grade I diastolic dysfunction (impaired relaxation). Right Ventricle: The right ventricular size is normal. No increase in right ventricular wall thickness. Right ventricular systolic function is normal. Left Atrium: Left atrial size was normal in size. Right Atrium: Right atrial size was normal in size. Pericardium: There is no evidence of pericardial effusion. Mitral Valve: The mitral valve is abnormal. There is moderate thickening of the mitral valve leaflet(s). Trivial mitral valve regurgitation. No evidence of mitral valve stenosis. Tricuspid Valve: The tricuspid valve is normal in structure. Tricuspid valve regurgitation is trivial. Aortic Valve: The aortic valve is tricuspid. Aortic valve regurgitation is not visualized. Mild aortic stenosis is present. Aortic valve mean gradient measures 9.0 mmHg. Aortic valve peak gradient measures 18.7 mmHg. Aortic valve area, by VTI measures 1.50 cm. Pulmonic Valve: The pulmonic valve was not well visualized. Pulmonic valve regurgitation is not visualized. Aorta: The aortic  root and ascending aorta are structurally normal, with no evidence of dilitation. IAS/Shunts: The interatrial septum was not well visualized.  LEFT VENTRICLE PLAX 2D LVIDd:  4.50 cm  Diastology LVIDs:         2.90 cm  LV e' medial:    7.51 cm/s LV PW:         1.00 cm  LV E/e' medial:  9.6 LV IVS:        0.80 cm  LV e' lateral:   8.81 cm/s LVOT diam:     2.00 cm  LV E/e' lateral: 8.2 LV SV:         69 LV SV Index:   36 LVOT Area:     3.14 cm  RIGHT VENTRICLE RV Basal diam:  3.20 cm RV S prime:     13.20 cm/s TAPSE (M-mode): 2.2 cm LEFT ATRIUM             Index       RIGHT ATRIUM           Index LA diam:        4.20 cm 2.16 cm/m  RA Area:     10.90 cm LA Vol (A2C):   67.6 ml 34.77 ml/m RA Volume:   21.70 ml  11.16 ml/m LA Vol (A4C):   42.6 ml 21.91 ml/m LA Biplane Vol: 55.7 ml 28.65 ml/m  AORTIC VALVE AV Area (Vmax):    1.47 cm AV Area (Vmean):   1.51 cm AV Area (VTI):     1.50 cm AV Vmax:           216.00 cm/s AV Vmean:          140.000 cm/s AV VTI:            0.460 m AV Peak Grad:      18.7 mmHg AV Mean Grad:      9.0 mmHg LVOT Vmax:         101.00 cm/s LVOT Vmean:        67.100 cm/s LVOT VTI:          0.220 m LVOT/AV VTI ratio: 0.48  AORTA Ao Root diam: 2.80 cm Ao Asc diam:  2.70 cm MITRAL VALVE                TRICUSPID VALVE MV Area (PHT): 3.89 cm     TR Peak grad:   23.6 mmHg MV Decel Time: 195 msec     TR Vmax:        243.00 cm/s MV E velocity: 72.20 cm/s MV A velocity: 109.00 cm/s  SHUNTS MV E/A ratio:  0.66         Systemic VTI:  0.22 m                             Systemic Diam: 2.00 cm Oswaldo Milian MD Electronically signed by Oswaldo Milian MD Signature Date/Time: 07/11/2020/5:40:13 PM    Final         Scheduled Meds: . amLODipine  5 mg Oral Daily  . atorvastatin  20 mg Oral Daily  . enoxaparin (LOVENOX) injection  40 mg Subcutaneous Q24H  . insulin aspart  0-9 Units Subcutaneous TID WC  . insulin detemir  8 Units Subcutaneous BID  . pantoprazole  40 mg Oral Daily   . senna-docusate  2 tablet Oral QHS  . sodium chloride flush  5 mL Intracatheter Q8H   Continuous Infusions: . piperacillin-tazobactam (ZOSYN)  IV 3.375 g (07/11/20 1415)     LOS: 5 days     Dana Allan, MD [facility] 07/11/2020, 5:53 PM  If 7PM-7AM, please contact night-coverage [email or web address]

## 2020-07-12 DIAGNOSIS — K651 Peritoneal abscess: Secondary | ICD-10-CM | POA: Diagnosis not present

## 2020-07-12 DIAGNOSIS — E785 Hyperlipidemia, unspecified: Secondary | ICD-10-CM | POA: Diagnosis not present

## 2020-07-12 DIAGNOSIS — E1169 Type 2 diabetes mellitus with other specified complication: Secondary | ICD-10-CM | POA: Diagnosis not present

## 2020-07-12 DIAGNOSIS — I10 Essential (primary) hypertension: Secondary | ICD-10-CM | POA: Diagnosis not present

## 2020-07-12 LAB — AEROBIC/ANAEROBIC CULTURE W GRAM STAIN (SURGICAL/DEEP WOUND): Culture: NO GROWTH

## 2020-07-12 LAB — CULTURE, BLOOD (ROUTINE X 2)
Culture: NO GROWTH
Special Requests: ADEQUATE

## 2020-07-12 LAB — GLUCOSE, CAPILLARY
Glucose-Capillary: 107 mg/dL — ABNORMAL HIGH (ref 70–99)
Glucose-Capillary: 224 mg/dL — ABNORMAL HIGH (ref 70–99)
Glucose-Capillary: 99 mg/dL (ref 70–99)
Glucose-Capillary: 235 mg/dL — ABNORMAL HIGH (ref 70–99)

## 2020-07-12 NOTE — Plan of Care (Signed)
  Problem: Clinical Measurements: Goal: Will remain free from infection Outcome: Progressing Goal: Diagnostic test results will improve Outcome: Progressing   

## 2020-07-12 NOTE — Progress Notes (Signed)
Physical Therapy Treatment Patient Details Name: Sandra Washington MRN: [id number] DOB: 12-Mar-1940 Today's Date: 07/12/2020    History of Present Illness Pt is an 80 yo female presenting 5/10 from PCP due to continued abdominal pain, nausea, vomiting, and weakness after gallbladder removal on 5/5.  Upon workup, pt found to have gallbladder fossa abcess, started on antibiotics. s/p drain placement 5/11. PMH includes: CKD, DM II, HTN, and stroke.    PT Comments    Continuing work on functional mobility and activity tolerance;  Session focused on progressive amb and posture for optimal healing; Pt pleasant, and looks forward to possibly getting drain out and getting home soon; She plans on going to her son's home initially   Follow Up Recommendations  Home health PT;Supervision for mobility/OOB     Equipment Recommendations  Rolling walker with 5" wheels;3in1 (PT)    Recommendations for Other Services       Precautions / Restrictions Precautions Precautions: Fall Precaution Comments: x1 fall, percutaneous drain right side    Mobility  Bed Mobility Overal bed mobility: Needs Assistance Bed Mobility: Supine to Sit     Supine to sit: Supervision;HOB elevated     General bed mobility comments: supervision for safety and line mgmt, no physical assist needed    Transfers Overall transfer level: Needs assistance Equipment used: Rolling walker (2 wheeled) Transfers: Sit to/from Stand Sit to Stand: Min guard         General transfer comment: min guard to rise from EOB; noted uncontrolled descent to sit after pushing amb distance  Ambulation/Gait Ambulation/Gait assistance: Min guard Gait Distance (Feet): 250 Feet Assistive device: Rolling walker (2 wheeled) Gait Pattern/deviations: Decreased stride length;Shuffle;Step-to pattern;Trunk flexed Gait velocity: decreased   General Gait Details: Cues to self-monitor for activity tolerance; Discussed deep breathing and  posture while ambulating   Stairs             Wheelchair Mobility    Modified Rankin (Stroke Patients Only)       Balance     Sitting balance-Leahy Scale: Good       Standing balance-Leahy Scale: Good                              Cognition Arousal/Alertness: Awake/alert Behavior During Therapy: WFL for tasks assessed/performed Overall Cognitive Status: Within Functional Limits for tasks assessed                                        Exercises      General Comments        Pertinent Vitals/Pain Pain Assessment: Faces Faces Pain Scale: Hurts a little bit Pain Location: R abdomen, near drain Pain Descriptors / Indicators: Discomfort;Grimacing Pain Intervention(s): Monitored during session    Home Living                      Prior Function            PT Goals (current goals can now be found in the care plan section) Acute Rehab PT Goals Patient Stated Goal: to go home PT Goal Formulation: With patient Time For Goal Achievement: 07/22/20 Potential to Achieve Goals: Good Progress towards PT goals: Progressing toward goals    Frequency    Min 3X/week      PT Plan Current plan remains appropriate;Frequency needs to be  updated    Co-evaluation              AM-PAC PT "6 Clicks" Mobility   Outcome Measure  Help needed turning from your back to your side while in a flat bed without using bedrails?: None Help needed moving from lying on your back to sitting on the side of a flat bed without using bedrails?: A Little Help needed moving to and from a bed to a chair (including a wheelchair)?: A Little Help needed standing up from a chair using your arms (e.g., wheelchair or bedside chair)?: A Little Help needed to walk in hospital room?: A Little Help needed climbing 3-5 steps with a railing? : A Little 6 Click Score: 19    End of Session   Activity Tolerance: Patient tolerated treatment well Patient  left: in chair;with call bell/phone within reach Nurse Communication: Mobility status PT Visit Diagnosis: Other abnormalities of gait and mobility (R26.89);Muscle weakness (generalized) (M62.81);Pain Pain - Right/Left: Right Pain - part of body:  (abdomen)     Time: 5916-3846 PT Time Calculation (min) (ACUTE ONLY): 24 min  Charges:  $Gait Training: 23-37 mins                     Roney Marion, [place]  [place] Pager [phone] Office [place] 07/12/2020, 10:09 AM

## 2020-07-12 NOTE — Progress Notes (Signed)
[facility] Infectious [facility]    Date of Admission:  07/06/2020   Total days of antibiotics 7           ID: Sandra Washington is a 80 y.o. female with   Principal Problem:   Intra-abdominal abscess ([place]) Active Problems:   Hypertension, essential   Type 2 diabetes mellitus with hyperlipidemia (HCC)    Subjective: afebrile  Medications:  . amLODipine  5 mg Oral Daily  . atorvastatin  20 mg Oral Daily  . enoxaparin (LOVENOX) injection  40 mg Subcutaneous Q24H  . insulin aspart  0-9 Units Subcutaneous TID WC  . insulin detemir  8 Units Subcutaneous BID  . pantoprazole  40 mg Oral Daily  . senna-docusate  2 tablet Oral QHS  . sodium chloride flush  5 mL Intracatheter Q8H    Objective: Vital signs in last 24 hours: Temp:  [98 F (36.7 C)-98.8 F (37.1 C)] 98.5 F (36.9 C) (05/16 0651) Pulse Rate:  [64-72] 64 (05/16 0651) Resp:  [16-23] 23 (05/16 0651) BP: (131-145)/(51-62) 137/60 (05/16 0651) SpO2:  [98 %-100 %] 99 % (05/16 0651)  Physical Exam  Constitutional:  oriented to person, place, and time. appears well-developed and well-nourished. No distress.  HENT: [place]/AT, PERRLA, no scleral icterus Mouth/Throat: Oropharynx is clear and moist. No oropharyngeal exudate.  Cardiovascular: Normal rate, regular rhythm and normal heart sounds. Exam reveals no gallop and no friction rub.  No murmur heard.  Pulmonary/Chest: Effort normal and breath sounds normal. No respiratory distress.  has no wheezes.  Neck = supple, no nuchal rigidity Abdominal: Soft. Bowel sounds are normal.  exhibits no distension. There is no tenderness. Drain with serosanginous fluid Lymphadenopathy: no cervical adenopathy. No axillary adenopathy Neurological: alert and oriented to person, place, and time.  Skin: Skin is warm and dry. No rash noted. No erythema.  Psychiatric: a normal mood and affect.  behavior is normal.   Lab Results No results for input(s): WBC, HGB, HCT, NA, K, CL,  CO2, BUN, CREATININE, GLU in the last 72 hours.  Invalid input(s): PLATELETS Liver Panel No results for input(s): PROT, ALBUMIN, AST, ALT, ALKPHOS, BILITOT, BILIDIR, IBILI in the last 72 hours. Sedimentation Rate No results for input(s): ESRSEDRATE in the last 72 hours. C-Reactive Protein No results for input(s): CRP in the last 72 hours.  Microbiology: 5/10 blood cx bacillus 5/11 blood cx NGTD Studies/Results: ECHOCARDIOGRAM COMPLETE  Result Date: 07/11/2020    ECHOCARDIOGRAM REPORT   Patient Name:   Sandra Washington Date of Exam: 07/11/2020 Medical Rec #:  [id number]              Height:       66.0 in Accession #:    [id number]             Weight:       187.2 lb Date of Birth:  [birth year]             BSA:          1.944 m Patient Age:    81 years               BP:           131/51 mmHg Patient Gender: F                      HR:           69 bpm. Exam Location:  Inpatient Procedure: 2D Echo, Cardiac Doppler  and Color Doppler Indications:    Bacteremia R 78.81  History:        Patient has no prior history of Echocardiogram examinations.                 Gallbladder surgery.  Sonographer:    Merrie Roof RDCS Referring Phys: [place]  1. Left ventricular ejection fraction, by estimation, is 60 to 65%. The left ventricle has normal function. The left ventricle has no regional wall motion abnormalities. There is mild left ventricular hypertrophy. Left ventricular diastolic parameters are consistent with Grade I diastolic dysfunction (impaired relaxation).  2. Right ventricular systolic function is normal. The right ventricular size is normal.  3. The mitral valve is abnormal. Significant thickening of anterior leaflet. Trivial mitral valve regurgitation. No evidence of mitral stenosis.  4. The aortic valve is tricuspid. Aortic valve regurgitation is not visualized. Mild aortic valve stenosis. Vmax 2.2 m/s, MG 89mmHg, AVA 1.5 cm^2, DI 0.48. Conclusion(s)/Recommendation(s):  No clear vegetation seen, but significant thickening of anterior mitral valve leaflet. Trivial MR. If clinical suspicion for endocarditis, recommend TEE. FINDINGS  Left Ventricle: Left ventricular ejection fraction, by estimation, is 60 to 65%. The left ventricle has normal function. The left ventricle has no regional wall motion abnormalities. The left ventricular internal cavity size was normal in size. There is  mild left ventricular hypertrophy. Left ventricular diastolic parameters are consistent with Grade I diastolic dysfunction (impaired relaxation). Right Ventricle: The right ventricular size is normal. No increase in right ventricular wall thickness. Right ventricular systolic function is normal. Left Atrium: Left atrial size was normal in size. Right Atrium: Right atrial size was normal in size. Pericardium: There is no evidence of pericardial effusion. Mitral Valve: The mitral valve is abnormal. There is moderate thickening of the mitral valve leaflet(s). Trivial mitral valve regurgitation. No evidence of mitral valve stenosis. Tricuspid Valve: The tricuspid valve is normal in structure. Tricuspid valve regurgitation is trivial. Aortic Valve: The aortic valve is tricuspid. Aortic valve regurgitation is not visualized. Mild aortic stenosis is present. Aortic valve mean gradient measures 9.0 mmHg. Aortic valve peak gradient measures 18.7 mmHg. Aortic valve area, by VTI measures 1.50 cm. Pulmonic Valve: The pulmonic valve was not well visualized. Pulmonic valve regurgitation is not visualized. Aorta: The aortic root and ascending aorta are structurally normal, with no evidence of dilitation. IAS/Shunts: The interatrial septum was not well visualized.  LEFT VENTRICLE PLAX 2D LVIDd:         4.50 cm  Diastology LVIDs:         2.90 cm  LV e' medial:    7.51 cm/s LV PW:         1.00 cm  LV E/e' medial:  9.6 LV IVS:        0.80 cm  LV e' lateral:   8.81 cm/s LVOT diam:     2.00 cm  LV E/e' lateral: 8.2 LV SV:          69 LV SV Index:   36 LVOT Area:     3.14 cm  RIGHT VENTRICLE RV Basal diam:  3.20 cm RV S prime:     13.20 cm/s TAPSE (M-mode): 2.2 cm LEFT ATRIUM             Index       RIGHT ATRIUM           Index LA diam:        4.20 cm 2.16 cm/m  RA Area:     10.90 cm LA Vol (A2C):   67.6 ml 34.77 ml/m RA Volume:   21.70 ml  11.16 ml/m LA Vol (A4C):   42.6 ml 21.91 ml/m LA Biplane Vol: 55.7 ml 28.65 ml/m  AORTIC VALVE AV Area (Vmax):    1.47 cm AV Area (Vmean):   1.51 cm AV Area (VTI):     1.50 cm AV Vmax:           216.00 cm/s AV Vmean:          140.000 cm/s AV VTI:            0.460 m AV Peak Grad:      18.7 mmHg AV Mean Grad:      9.0 mmHg LVOT Vmax:         101.00 cm/s LVOT Vmean:        67.100 cm/s LVOT VTI:          0.220 m LVOT/AV VTI ratio: 0.48  AORTA Ao Root diam: 2.80 cm Ao Asc diam:  2.70 cm MITRAL VALVE                TRICUSPID VALVE MV Area (PHT): 3.89 cm     TR Peak grad:   23.6 mmHg MV Decel Time: 195 msec     TR Vmax:        243.00 cm/s MV E velocity: 72.20 cm/s MV A velocity: 109.00 cm/s  SHUNTS MV E/A ratio:  0.66         Systemic VTI:  0.22 m                             Systemic Diam: 2.00 cm Oswaldo Milian MD Electronically signed by Oswaldo Milian MD Signature Date/Time: 07/11/2020/5:40:13 PM    Final      Assessment/Plan: Gallbladder fossa abscess  = currently on day 7 of piptazo. Recommend to transition to oral abtx with amox/clav tomorrow to see how she tolerates. Please check cbc and cmp tomorrow  Bacillus bacteremia = unclear if contaminant since only found in 1 of 4 bottles. Will do addn blood cx tomorrow to whether need to do further investigation for endocarditis  [facility] Cell: [phone] Pager: [phone]  07/12/2020, 2:10 PM

## 2020-07-12 NOTE — Progress Notes (Signed)
PROGRESS NOTE    Sandra Washington  [id number] DOB: [birth year] DOA: 07/06/2020 PCP: Lucianne Lei, MD   Brief Narrative: Sandra Washington is a 80 y.o. female with history of hypertension, hyperlipidemia, diabetes mellitus type 2, GERD.  Patient presents secondary to right upper quadrant pain, nausea, vomiting and found to have a large intra-abdominal abscess located in her gallbladder fossa.  General surgery and interventional radiology consulted on admission.  Plan for percutaneous drain placement.  IV antibiotics started on admission.   Assessment & Plan:   Principal Problem:   Intra-abdominal abscess ([place]) Active Problems:   Hypertension, essential   Type 2 diabetes mellitus with hyperlipidemia (HCC)   Intra-abdominal abscess vs bile leak CT abdomen/pelvis suggests large abscess related to recent laparoscopic cholecystectomy. General surgery consulted on admission along with IR. Zosyn IV initiated empirically. Blood cultures obtained on admission.  Drain placed by IR on 5/11 with initial description of bilious fluid.  HIDA scan without suggestion of bile leak. Infectious disease consulted. -Infectious disease recommendations: continue Zosyn IV; monitor drain output -General surgery recommendations: Signed off -Will need outpatient IR follow-up for drain management  Positive blood cultures with Bacillus species 1/4 bottles. In setting of above. Transthoracic Echocardiogram ordered and insignificant for valvular vegetation. -Infectious disease recommendations: pending today  Intractable nausea/vomiting/diarrhea Symptoms appear to be improved. IV fluids started.  Improved. -Continue compazine prn  Diabetes mellitus, type 2 Last hemoglobin A1c of 6.4% from March 2022.  Patient is managed as an outpatient on insulin NPH 8 units in a.m. and 12 units in the evening in addition to insulin regular 8 units with breakfast and lunch in addition to 16 units with dinner.  On  admission, patient started on sliding scale. -Continue Levemir 8 units twice daily while inpatient and continue sliding scale insulin  Primary hypertension Patient is on valsartan hydrochlorothiazide as an outpatient.  Blood pressure is slightly uncontrolled.  Patient was started on amlodipine 5 mg while inpatient. -Continue amlodipine  GERD -Continue Protonix   DVT prophylaxis: SCDs Code Status:   Code Status: Full Code Family Communication: None at bedside Disposition Plan: Anticipate discharge likely home in 1 to 2 days pending infectious disease recommendations. PT/OT recommending HHPT with rolling walker and 3 in 1.   Consultants:   General surgery  Interventional radiology  Procedures:   CT GUIDED PLACEMENT OF A 12 FR DRAIN CATHETER (07/07/2020)  Antimicrobials:  Zosyn IV   Subjective: No significant issues overnight. No significant abdominal pain. Drain with slightly bloody fluid.  Objective: Vitals:   07/11/20 0722 07/11/20 1513 07/11/20 1951 07/12/20 0651  BP: 127/60 (!) 131/51 (!) 145/62 137/60  Pulse: 60 67 72 64  Resp: 17 16 19  (!) 23  Temp: 97.7 F (36.5 C) 98 F (36.7 C) 98.8 F (37.1 C) 98.5 F (36.9 C)  TempSrc: Oral Oral Oral Oral  SpO2: 96% 100% 98% 99%  Weight:        Intake/Output Summary (Last 24 hours) at 07/12/2020 1229 Last data filed at 07/12/2020 1129 Gross per 24 hour  Intake 355 ml  Output 20 ml  Net 335 ml   Filed Weights   07/09/20 0422 07/11/20 0612  Weight: 88.8 kg 84.9 kg    Examination:  General exam: Appears calm and comfortable Respiratory system: Clear to auscultation. Respiratory effort normal. Cardiovascular system: S1 & S2 heard, RRR. No murmurs, rubs, gallops or clicks. Gastrointestinal system: Abdomen is nondistended, soft and nontender. No organomegaly or masses felt. Normal bowel sounds heard. Drain  with lightly blood/serosanguinous fluid Central nervous system: Alert and oriented. No focal neurological  deficits. Musculoskeletal: No calf tenderness Skin: No cyanosis. No rashes Psychiatry: Judgement and insight appear normal. Mood & affect appropriate.   Data Reviewed: I have personally reviewed following labs and imaging studies  CBC Lab Results  Component Value Date   WBC 8.8 07/08/2020   RBC 3.87 07/08/2020   HGB 10.4 (L) 07/08/2020   HCT 33.3 (L) 07/08/2020   MCV 86.0 07/08/2020   MCH 26.9 07/08/2020   PLT 376 07/08/2020   MCHC 31.2 07/08/2020   RDW 14.1 07/08/2020   LYMPHSABS 2.4 07/06/2020   MONOABS 1.2 (H) 07/06/2020   EOSABS 0.1 07/06/2020   BASOSABS 0.1 62/95/2841     Last metabolic panel Lab Results  Component Value Date   NA 135 07/08/2020   K 3.9 07/08/2020   CL 102 07/08/2020   CO2 24 07/08/2020   BUN 7 (L) 07/08/2020   CREATININE 0.78 07/08/2020   GLUCOSE 151 (H) 07/08/2020   GFRNONAA >60 07/08/2020   GFRAA >90 04/09/2012   CALCIUM 8.7 (L) 07/08/2020   PHOS 3.8 07/07/2020   PROT 5.1 (L) 07/08/2020   ALBUMIN 2.3 (L) 07/08/2020   BILITOT 0.5 07/08/2020   ALKPHOS 61 07/08/2020   AST 14 (L) 07/08/2020   ALT 16 07/08/2020   ANIONGAP 9 07/08/2020    CBG (last 3)  Recent Labs    07/11/20 1948 07/12/20 0610 07/12/20 1124  GLUCAP 171* 107* 224*     GFR: Estimated Creatinine Clearance: 60.5 mL/min (by C-G formula based on SCr of 0.78 mg/dL).  Coagulation Profile: Recent Labs  Lab 07/07/20 1336  INR 1.1    Recent Results (from the past 240 hour(s))  Culture, blood (routine x 2)     Status: Abnormal (Preliminary result)   Collection Time: 07/06/20 12:33 AM   Specimen: BLOOD  Result Value Ref Range Status   Specimen Description BLOOD SITE NOT SPECIFIED  Final   Special Requests   Final    BOTTLES DRAWN AEROBIC AND ANAEROBIC Blood Culture adequate volume   Culture  Setup Time   Final    GRAM POSITIVE RODS AEROBIC BOTTLE ONLY CRITICAL RESULT CALLED TO, READ BACK BY AND VERIFIED WITH: PHARM D M.BITONTI ON [id number] AT 1610 BY E.PARRISH     Culture (A)  Final    BACILLUS SPECIES Standardized susceptibility testing for this organism is not available. ISOLATE REFERRED FOR ID ONLY Performed at [facility], [place] [address], [place], [place] [address]    Report Status PENDING  Incomplete  Resp Panel by RT-PCR (Flu A&B, Covid) Nasopharyngeal Swab     Status: None   Collection Time: 07/06/20 11:53 PM   Specimen: Nasopharyngeal Swab; Nasopharyngeal(NP) swabs in vial transport medium  Result Value Ref Range Status   SARS Coronavirus 2 by RT PCR NEGATIVE NEGATIVE Final    Comment: (NOTE) SARS-CoV-2 target nucleic acids are NOT DETECTED.  The SARS-CoV-2 RNA is generally detectable in upper respiratory specimens during the acute phase of infection. The lowest concentration of SARS-CoV-2 viral copies this assay can detect is 138 copies/mL. A negative result does not preclude SARS-Cov-2 infection and should not be used as the sole basis for treatment or other patient management decisions. A negative result may occur with  improper specimen collection/handling, submission of specimen other than nasopharyngeal swab, presence of viral mutation(s) within the areas targeted by this assay, and inadequate number of viral copies(<138 copies/mL). A negative result must be combined  with clinical observations, patient history, and epidemiological information. The expected result is Negative.  Fact Sheet for Patients:  EntrepreneurPulse.com.au  Fact Sheet for Healthcare Providers:  IncredibleEmployment.be  This test is no t yet approved or cleared by the [place] FDA and  has been authorized for detection and/or diagnosis of SARS-CoV-2 by FDA under an Emergency Use Authorization (EUA). This EUA will remain  in effect (meaning this test can be used) for the duration of the COVID-19 declaration under Section 564(b)(1) of the Act, 21 U.S.C.section 360bbb-3(b)(1), unless the authorization is  terminated  or revoked sooner.       Influenza A by PCR NEGATIVE NEGATIVE Final   Influenza B by PCR NEGATIVE NEGATIVE Final    Comment: (NOTE) The Xpert Xpress SARS-CoV-2/FLU/RSV plus assay is intended as an aid in the diagnosis of influenza from Nasopharyngeal swab specimens and should not be used as a sole basis for treatment. Nasal washings and aspirates are unacceptable for Xpert Xpress SARS-CoV-2/FLU/RSV testing.  Fact Sheet for Patients: EntrepreneurPulse.com.au  Fact Sheet for Healthcare Providers: IncredibleEmployment.be  This test is not yet approved or cleared by the [place] FDA and has been authorized for detection and/or diagnosis of SARS-CoV-2 by FDA under an Emergency Use Authorization (EUA). This EUA will remain in effect (meaning this test can be used) for the duration of the COVID-19 declaration under Section 564(b)(1) of the Act, 21 U.S.C. section 360bbb-3(b)(1), unless the authorization is terminated or revoked.  Performed at [facility], [place] [address] [place], [place] [address]   Culture, blood (routine x 2)     Status: None (Preliminary result)   Collection Time: 07/07/20  4:26 AM   Specimen: BLOOD RIGHT HAND  Result Value Ref Range Status   Specimen Description BLOOD RIGHT HAND  Final   Special Requests   Final    BOTTLES DRAWN AEROBIC AND ANAEROBIC Blood Culture adequate volume   Culture   Final    NO GROWTH 4 DAYS Performed at [facility], [place] [address], [place], [place] [address]    Report Status PENDING  Incomplete  Aerobic/Anaerobic Culture (surgical/deep wound)     Status: None (Preliminary result)   Collection Time: 07/07/20  3:57 PM   Specimen: Abscess  Result Value Ref Range Status   Specimen Description ABSCESS  Final   Special Requests DRAIN  Final   Gram Stain   Final    FEW WBC PRESENT,BOTH PMN AND MONONUCLEAR NO ORGANISMS SEEN    Culture   Final    NO GROWTH 4 DAYS NO  ANAEROBES ISOLATED; CULTURE IN PROGRESS FOR 5 DAYS Performed at [facility], [address], [place], [place] [address]    Report Status PENDING  Incomplete        Radiology Studies: ECHOCARDIOGRAM COMPLETE  Result Date: 07/11/2020    ECHOCARDIOGRAM REPORT   Patient Name:   Sandra Washington Date of Exam: 07/11/2020 Medical Rec #:  [id number]              Height:       66.0 in Accession #:    [id number]             Weight:       187.2 lb Date of Birth:  13-Jun-1940             BSA:          1.944 m Patient Age:    24 years  BP:           131/51 mmHg Patient Gender: F                      HR:           69 bpm. Exam Location:  Inpatient Procedure: 2D Echo, Cardiac Doppler and Color Doppler Indications:    Bacteremia R 78.81  History:        Patient has no prior history of Echocardiogram examinations.                 Gallbladder surgery.  Sonographer:    Merrie Roof RDCS Referring Phys: [place]  1. Left ventricular ejection fraction, by estimation, is 60 to 65%. The left ventricle has normal function. The left ventricle has no regional wall motion abnormalities. There is mild left ventricular hypertrophy. Left ventricular diastolic parameters are consistent with Grade I diastolic dysfunction (impaired relaxation).  2. Right ventricular systolic function is normal. The right ventricular size is normal.  3. The mitral valve is abnormal. Significant thickening of anterior leaflet. Trivial mitral valve regurgitation. No evidence of mitral stenosis.  4. The aortic valve is tricuspid. Aortic valve regurgitation is not visualized. Mild aortic valve stenosis. Vmax 2.2 m/s, MG 68mmHg, AVA 1.5 cm^2, DI 0.48. Conclusion(s)/Recommendation(s): No clear vegetation seen, but significant thickening of anterior mitral valve leaflet. Trivial MR. If clinical suspicion for endocarditis, recommend TEE. FINDINGS  Left Ventricle: Left ventricular ejection fraction, by estimation,  is 60 to 65%. The left ventricle has normal function. The left ventricle has no regional wall motion abnormalities. The left ventricular internal cavity size was normal in size. There is  mild left ventricular hypertrophy. Left ventricular diastolic parameters are consistent with Grade I diastolic dysfunction (impaired relaxation). Right Ventricle: The right ventricular size is normal. No increase in right ventricular wall thickness. Right ventricular systolic function is normal. Left Atrium: Left atrial size was normal in size. Right Atrium: Right atrial size was normal in size. Pericardium: There is no evidence of pericardial effusion. Mitral Valve: The mitral valve is abnormal. There is moderate thickening of the mitral valve leaflet(s). Trivial mitral valve regurgitation. No evidence of mitral valve stenosis. Tricuspid Valve: The tricuspid valve is normal in structure. Tricuspid valve regurgitation is trivial. Aortic Valve: The aortic valve is tricuspid. Aortic valve regurgitation is not visualized. Mild aortic stenosis is present. Aortic valve mean gradient measures 9.0 mmHg. Aortic valve peak gradient measures 18.7 mmHg. Aortic valve area, by VTI measures 1.50 cm. Pulmonic Valve: The pulmonic valve was not well visualized. Pulmonic valve regurgitation is not visualized. Aorta: The aortic root and ascending aorta are structurally normal, with no evidence of dilitation. IAS/Shunts: The interatrial septum was not well visualized.  LEFT VENTRICLE PLAX 2D LVIDd:         4.50 cm  Diastology LVIDs:         2.90 cm  LV e' medial:    7.51 cm/s LV PW:         1.00 cm  LV E/e' medial:  9.6 LV IVS:        0.80 cm  LV e' lateral:   8.81 cm/s LVOT diam:     2.00 cm  LV E/e' lateral: 8.2 LV SV:         69 LV SV Index:   36 LVOT Area:     3.14 cm  RIGHT VENTRICLE RV Basal diam:  3.20 cm RV S prime:  13.20 cm/s TAPSE (M-mode): 2.2 cm LEFT ATRIUM             Index       RIGHT ATRIUM           Index LA diam:        4.20 cm  2.16 cm/m  RA Area:     10.90 cm LA Vol (A2C):   67.6 ml 34.77 ml/m RA Volume:   21.70 ml  11.16 ml/m LA Vol (A4C):   42.6 ml 21.91 ml/m LA Biplane Vol: 55.7 ml 28.65 ml/m  AORTIC VALVE AV Area (Vmax):    1.47 cm AV Area (Vmean):   1.51 cm AV Area (VTI):     1.50 cm AV Vmax:           216.00 cm/s AV Vmean:          140.000 cm/s AV VTI:            0.460 m AV Peak Grad:      18.7 mmHg AV Mean Grad:      9.0 mmHg LVOT Vmax:         101.00 cm/s LVOT Vmean:        67.100 cm/s LVOT VTI:          0.220 m LVOT/AV VTI ratio: 0.48  AORTA Ao Root diam: 2.80 cm Ao Asc diam:  2.70 cm MITRAL VALVE                TRICUSPID VALVE MV Area (PHT): 3.89 cm     TR Peak grad:   23.6 mmHg MV Decel Time: 195 msec     TR Vmax:        243.00 cm/s MV E velocity: 72.20 cm/s MV A velocity: 109.00 cm/s  SHUNTS MV E/A ratio:  0.66         Systemic VTI:  0.22 m                             Systemic Diam: 2.00 cm Oswaldo Milian MD Electronically signed by Oswaldo Milian MD Signature Date/Time: 07/11/2020/5:40:13 PM    Final         Scheduled Meds: . amLODipine  5 mg Oral Daily  . atorvastatin  20 mg Oral Daily  . enoxaparin (LOVENOX) injection  40 mg Subcutaneous Q24H  . insulin aspart  0-9 Units Subcutaneous TID WC  . insulin detemir  8 Units Subcutaneous BID  . pantoprazole  40 mg Oral Daily  . senna-docusate  2 tablet Oral QHS  . sodium chloride flush  5 mL Intracatheter Q8H   Continuous Infusions: . piperacillin-tazobactam (ZOSYN)  IV 3.375 g (07/12/20 1207)     LOS: 6 days     Cordelia Poche, MD [facility] 07/12/2020, 12:29 PM  If 7PM-7AM, please contact night-coverage [email or web address]

## 2020-07-13 DIAGNOSIS — I10 Essential (primary) hypertension: Secondary | ICD-10-CM | POA: Diagnosis not present

## 2020-07-13 DIAGNOSIS — E1169 Type 2 diabetes mellitus with other specified complication: Secondary | ICD-10-CM | POA: Diagnosis not present

## 2020-07-13 DIAGNOSIS — E785 Hyperlipidemia, unspecified: Secondary | ICD-10-CM | POA: Diagnosis not present

## 2020-07-13 DIAGNOSIS — K651 Peritoneal abscess: Secondary | ICD-10-CM | POA: Diagnosis not present

## 2020-07-13 LAB — CBC
HCT: 37.3 % (ref 36.0–46.0)
Hemoglobin: 11.5 g/dL — ABNORMAL LOW (ref 12.0–15.0)
MCH: 27.1 pg (ref 26.0–34.0)
MCHC: 30.8 g/dL (ref 30.0–36.0)
MCV: 88 fL (ref 80.0–100.0)
Platelets: 476 10*3/uL — ABNORMAL HIGH (ref 150–400)
RBC: 4.24 MIL/uL (ref 3.87–5.11)
RDW: 14 % (ref 11.5–15.5)
WBC: 7.1 10*3/uL (ref 4.0–10.5)
nRBC: 0 % (ref 0.0–0.2)

## 2020-07-13 LAB — HIV-1 RNA QUANT-NO REFLEX-BLD
HIV 1 RNA Quant: <20 copies/mL
LOG10 HIV-1 RNA: UNDETERMINED log10copy/mL

## 2020-07-13 LAB — COMPREHENSIVE METABOLIC PANEL
ALT: 13 U/L (ref 0–44)
AST: 18 U/L (ref 15–41)
Albumin: 2.9 g/dL — ABNORMAL LOW (ref 3.5–5.0)
Alkaline Phosphatase: 61 U/L (ref 38–126)
Anion gap: 5 (ref 5–15)
BUN: 5 mg/dL — ABNORMAL LOW (ref 8–23)
CO2: 27 mmol/L (ref 22–32)
Calcium: 9.4 mg/dL (ref 8.9–10.3)
Chloride: 107 mmol/L (ref 98–111)
Creatinine, Ser: 0.85 mg/dL (ref 0.44–1.00)
GFR, Estimated: >60 mL/min (ref >60)
Glucose, Bld: 174 mg/dL — ABNORMAL HIGH (ref 70–99)
Potassium: 4.5 mmol/L (ref 3.5–5.1)
Sodium: 139 mmol/L (ref 135–145)
Total Bilirubin: 0.7 mg/dL (ref 0.3–1.2)
Total Protein: 6.4 g/dL — ABNORMAL LOW (ref 6.5–8.1)

## 2020-07-13 LAB — GLUCOSE, CAPILLARY
Glucose-Capillary: 128 mg/dL — ABNORMAL HIGH (ref 70–99)
Glucose-Capillary: 163 mg/dL — ABNORMAL HIGH (ref 70–99)
Glucose-Capillary: 117 mg/dL — ABNORMAL HIGH (ref 70–99)
Glucose-Capillary: 195 mg/dL — ABNORMAL HIGH (ref 70–99)

## 2020-07-13 LAB — CREATININE, SERUM
Creatinine, Ser: 0.78 mg/dL (ref 0.44–1.00)
GFR, Estimated: >60 mL/min (ref >60)

## 2020-07-13 MED ORDER — AMOXICILLIN-POT CLAVULANATE 875-125 MG PO TABS
1.0000 | ORAL_TABLET | Freq: Two times a day (BID) | ORAL | Status: DC
  Administered 2020-07-13 – 2020-07-15 (×5): 1 via ORAL
  Filled 2020-07-13 (×5): qty 1

## 2020-07-13 NOTE — Progress Notes (Signed)
Referring Physician(s): Dr. Nevada Crane, C.   Supervising Physician: Sandi Mariscal  Patient Status:  [facility] - In-pt  Chief Complaint:  S/p gallbladder fossa fluid collection drain placement on 5/11 with Dr. Pascal Lux   Subjective:  Patient walking with PT in her room.  RN reports no issue with drain.   Allergies: Patient has no known allergies.  Medications: Prior to Admission medications   Medication Sig Start Date End Date Taking? Authorizing Provider  amLODipine (NORVASC) 5 MG tablet Take 5 mg by mouth 2 (two) times daily. 10/19/14  Yes [provider]  atorvastatin (LIPITOR) 20 MG tablet Take 1 tablet (20 mg total) by mouth daily. 12/17/18  Yes Elayne Snare, MD  Cholecalciferol (VITAMIN D3) 5000 UNITS CAPS Take 5,000 Units by mouth daily.   Yes [provider]  insulin NPH Human (NOVOLIN N RELION) 100 UNIT/ML injection 8 U in am and 12 U hs Patient taking differently: Inject 8-12 Units into the skin See admin instructions. Takes 8 units in the morning and 12 units at night 11/06/19  Yes Elayne Snare, MD  insulin regular (NOVOLIN R RELION) 100 units/mL injection INJECT 8 UNITS SUBCUTANEOUSLY WITH BREAKFAST AND LUNCH, AND 16 UNITS WITH DINNER Patient taking differently: Inject 8-16 Units into the skin See admin instructions. Takes 8 units  with breakfast and lunch, and 16 units with dinner 11/06/19  Yes Elayne Snare, MD  niacin (SLO-NIACIN) 500 MG tablet Take 500 mg by mouth at bedtime.   Yes [provider]  omeprazole (PRILOSEC) 40 MG capsule Take 40 mg by mouth daily.   Yes [provider]  ondansetron (ZOFRAN) 4 MG tablet Take 1 tablet (4 mg total) by mouth every 6 (six) hours as needed for nausea. 07/03/20  Yes Sheikh, Omair Latif, DO  oxyCODONE (OXY IR/ROXICODONE) 5 MG immediate release tablet Take 1-2 tablets (5-10 mg total) by mouth every 4 (four) hours as needed for moderate pain. 07/03/20  Yes Sheikh, Omair Latif, DO  valsartan-hydrochlorothiazide  (DIOVAN-HCT) 160-12.5 MG tablet Take 1 tablet by mouth daily.   Yes [provider]  ACCU-CHEK SOFTCLIX LANCETS lancets Use bid 05/01/17   Elayne Snare, MD  Blood Glucose Monitoring Suppl (ACCU-CHEK GUIDE ME) w/Device KIT 1 each by Does not apply route 2 (two) times daily. Use accu chek guide me device to check blood sugar twice daily.DX:E11.65 12/23/18   Elayne Snare, MD  docusate sodium (COLACE) 100 MG capsule Take 1 capsule (100 mg total) by mouth 2 (two) times daily. Patient not taking: No sig reported 07/03/20   Raiford Noble Latif, DO  glucose blood (ACCU-CHEK GUIDE) test strip 1 each by Other route 2 (two) times daily. Use as instructed 12/17/18   Elayne Snare, MD     Vital Signs: BP (!) 146/53 (BP Location: Left Arm)   Pulse 67   Temp 98.4 F (36.9 C) (Oral)   Resp 18   Wt 186 lb 15.2 oz (84.8 kg)   SpO2 100%   BMI 30.17 kg/m   Physical Exam Vitals reviewed.  Constitutional:      General: She is not in acute distress.    Appearance: She is not ill-appearing.  Cardiovascular:     Rate and Rhythm: Normal rate.  Pulmonary:     Effort: Pulmonary effort is normal.  Neurological:     Mental Status: She is alert and oriented to person, place, and time.  Psychiatric:        Mood and Affect: Mood normal.  Behavior: Behavior normal.    Imaging: ECHOCARDIOGRAM COMPLETE  Result Date: 07/11/2020    ECHOCARDIOGRAM REPORT   Patient Name:   Sandra Washington Date of Exam: 07/11/2020 Medical Rec #:  [id number]              Height:       66.0 in Accession #:    [id number]             Weight:       187.2 lb Date of Birth:  Jun 06, 1940             BSA:          1.944 m Patient Age:    80 years               BP:           131/51 mmHg Patient Gender: F                      HR:           69 bpm. Exam Location:  Inpatient Procedure: 2D Echo, Cardiac Doppler and Color Doppler Indications:    Bacteremia R 78.81  History:        Patient has no prior history of Echocardiogram  examinations.                 Gallbladder surgery.  Sonographer:    Merrie Roof RDCS Referring Phys: [place]  1. Left ventricular ejection fraction, by estimation, is 60 to 65%. The left ventricle has normal function. The left ventricle has no regional wall motion abnormalities. There is mild left ventricular hypertrophy. Left ventricular diastolic parameters are consistent with Grade I diastolic dysfunction (impaired relaxation).  2. Right ventricular systolic function is normal. The right ventricular size is normal.  3. The mitral valve is abnormal. Significant thickening of anterior leaflet. Trivial mitral valve regurgitation. No evidence of mitral stenosis.  4. The aortic valve is tricuspid. Aortic valve regurgitation is not visualized. Mild aortic valve stenosis. Vmax 2.2 m/s, MG 25mHg, AVA 1.5 cm^2, DI 0.48. Conclusion(s)/Recommendation(s): No clear vegetation seen, but significant thickening of anterior mitral valve leaflet. Trivial MR. If clinical suspicion for endocarditis, recommend TEE. FINDINGS  Left Ventricle: Left ventricular ejection fraction, by estimation, is 60 to 65%. The left ventricle has normal function. The left ventricle has no regional wall motion abnormalities. The left ventricular internal cavity size was normal in size. There is  mild left ventricular hypertrophy. Left ventricular diastolic parameters are consistent with Grade I diastolic dysfunction (impaired relaxation). Right Ventricle: The right ventricular size is normal. No increase in right ventricular wall thickness. Right ventricular systolic function is normal. Left Atrium: Left atrial size was normal in size. Right Atrium: Right atrial size was normal in size. Pericardium: There is no evidence of pericardial effusion. Mitral Valve: The mitral valve is abnormal. There is moderate thickening of the mitral valve leaflet(s). Trivial mitral valve regurgitation. No evidence of mitral valve stenosis.  Tricuspid Valve: The tricuspid valve is normal in structure. Tricuspid valve regurgitation is trivial. Aortic Valve: The aortic valve is tricuspid. Aortic valve regurgitation is not visualized. Mild aortic stenosis is present. Aortic valve mean gradient measures 9.0 mmHg. Aortic valve peak gradient measures 18.7 mmHg. Aortic valve area, by VTI measures 1.50 cm. Pulmonic Valve: The pulmonic valve was not well visualized. Pulmonic valve regurgitation is not visualized. Aorta: The aortic root and ascending aorta are structurally normal, with no  evidence of dilitation. IAS/Shunts: The interatrial septum was not well visualized.  LEFT VENTRICLE PLAX 2D LVIDd:         4.50 cm  Diastology LVIDs:         2.90 cm  LV e' medial:    7.51 cm/s LV PW:         1.00 cm  LV E/e' medial:  9.6 LV IVS:        0.80 cm  LV e' lateral:   8.81 cm/s LVOT diam:     2.00 cm  LV E/e' lateral: 8.2 LV SV:         69 LV SV Index:   36 LVOT Area:     3.14 cm  RIGHT VENTRICLE RV Basal diam:  3.20 cm RV S prime:     13.20 cm/s TAPSE (M-mode): 2.2 cm LEFT ATRIUM             Index       RIGHT ATRIUM           Index LA diam:        4.20 cm 2.16 cm/m  RA Area:     10.90 cm LA Vol (A2C):   67.6 ml 34.77 ml/m RA Volume:   21.70 ml  11.16 ml/m LA Vol (A4C):   42.6 ml 21.91 ml/m LA Biplane Vol: 55.7 ml 28.65 ml/m  AORTIC VALVE AV Area (Vmax):    1.47 cm AV Area (Vmean):   1.51 cm AV Area (VTI):     1.50 cm AV Vmax:           216.00 cm/s AV Vmean:          140.000 cm/s AV VTI:            0.460 m AV Peak Grad:      18.7 mmHg AV Mean Grad:      9.0 mmHg LVOT Vmax:         101.00 cm/s LVOT Vmean:        67.100 cm/s LVOT VTI:          0.220 m LVOT/AV VTI ratio: 0.48  AORTA Ao Root diam: 2.80 cm Ao Asc diam:  2.70 cm MITRAL VALVE                TRICUSPID VALVE MV Area (PHT): 3.89 cm     TR Peak grad:   23.6 mmHg MV Decel Time: 195 msec     TR Vmax:        243.00 cm/s MV E velocity: 72.20 cm/s MV A velocity: 109.00 cm/s  SHUNTS MV E/A ratio:  0.66          Systemic VTI:  0.22 m                             Systemic Diam: 2.00 cm Oswaldo Milian MD Electronically signed by Oswaldo Milian MD Signature Date/Time: 07/11/2020/5:40:13 PM    Final     Labs:  CBC: Recent Labs    07/03/20 0506 07/06/20 1805 07/07/20 0238 07/08/20 0128  WBC 10.1 11.6* 10.3 8.8  HGB 10.7* 12.3 10.9* 10.4*  HCT 34.7* 39.1 35.1* 33.3*  PLT 205 381 354 376    COAGS: Recent Labs    07/07/20 1336  INR 1.1    BMP: Recent Labs    07/03/20 0506 07/06/20 1805 07/07/20 0238 07/08/20 0128 07/13/20 0430  NA 137 133* 135 135  --  K 3.2* 3.2* 4.1 3.9  --   CL 106 97* 103 102  --   CO2 _0 --   GLUCOSE 61* 144* 128* 151*  --   BUN 15 7* 8 7*  --   CALCIUM 8.4* 9.1 8.9 8.7*  --   CREATININE 0.66 0.92 1.06* 0.78 0.78  GFRNONAA >60 >60 53* >60 >60    LIVER FUNCTION TESTS: Recent Labs    07/03/20 0506 07/06/20 1805 07/07/20 0238 07/08/20 0128  BILITOT 0.6 0.9 0.7 0.5  AST 25 16 13* 14*  ALT _1 ALKPHOS 59 81 70 61  PROT 5.5* 6.5 5.3* 5.1*  ALBUMIN 2.5* 2.9* 2.4* 2.3*    Assessment and Plan: 80 year old female with history of cholecystectomy on 07/01/2020 presented with fluid collection within the gallbladder fossa, s/p image guided gallbladder fossa fluid collection drain placement with Dr. Pascal Lux on 07/07/2020.   Pt stable No documented OP from 5/16-17. OP 5/15-16 20 cc  RN reports drain flushes well.  VSS CBC stable  LFT w/in normal range  cx showed no growth   Continue with flushing TID, output recording q shift and dressing changes as needed. Would consider additional imaging when output is less than 10 ml for 24 hours not including flush material.     Further treatment plan per [facility]/ ID Appreciate and agree with the plan.  IR to follow.    Electronically Signed: Tera Mater, PA-C 07/13/2020, 10:17 AM   I spent a total of 15 Minutes at the the patient's bedside AND on the patient's hospital floor or  unit, greater than 50% of which was counseling/coordinating care for gallbladder fossa fluid collection drain

## 2020-07-13 NOTE — Progress Notes (Signed)
PROGRESS NOTE    ADLEMI Sandra Washington  [id number] DOB: 27-May-1940 DOA: 07/06/2020 PCP: Lucianne Lei, MD   Brief Narrative: Sandra Washington is a 80 y.o. female with history of hypertension, hyperlipidemia, diabetes mellitus type 2, GERD.  Patient presents secondary to right upper quadrant pain, nausea, vomiting and found to have a large intra-abdominal abscess located in her gallbladder fossa.  General surgery and interventional radiology consulted on admission.  Plan for percutaneous drain placement.  IV antibiotics started on admission.   Assessment & Plan:   Principal Problem:   Intra-abdominal abscess ([place]) Active Problems:   Hypertension, essential   Type 2 diabetes mellitus with hyperlipidemia (H[facility])   Intra-abdominal abscess vs bile leak CT abdomen/pelvis suggests large abscess related to recent laparoscopic cholecystectomy. General surgery consulted on admission along with IR. Zosyn IV initiated empirically. Blood cultures obtained on admission.  Drain placed by IR on 5/11 with initial description of bilious fluid.  HIDA scan without suggestion of bile leak. Infectious disease consulted. -Infectious disease recommendations: transitioned to Augmentin PO -Interventional radiology recommendations: Repeat imaging when output <10 ml x 24 hours (not including flush fluid) -General surgery recommendations: Signed off -Will need outpatient IR follow-up for drain management  Positive blood cultures with Bacillus species 1/4 bottles. In setting of above. Transthoracic Echocardiogram ordered and insignificant for valvular vegetation. -Infectious disease recommendations: Repeat blood cultures  Intractable nausea/vomiting/diarrhea Symptoms appear to be improved. IV fluids started.  Improved. -Continue compazine prn  Diabetes mellitus, type 2 Last hemoglobin A1c of 6.4% from March 2022.  Patient is managed as an outpatient on insulin NPH 8 units in a.m. and 12 units in the  evening in addition to insulin regular 8 units with breakfast and lunch in addition to 16 units with dinner.  On admission, patient started on sliding scale. -Continue Levemir 8 units twice daily while inpatient and continue sliding scale insulin  Primary hypertension Patient is on valsartan hydrochlorothiazide as an outpatient.  Blood pressure is slightly uncontrolled.  Patient was started on amlodipine 5 mg while inpatient. -Continue amlodipine  GERD -Continue Protonix   DVT prophylaxis: SCDs Code Status:   Code Status: Full Code Family Communication: None at bedside Disposition Plan: Anticipate discharge likely home in 1 to 2 days pending infectious disease recommendations. PT/OT recommending HHPT with rolling walker and 3 in 1.   Consultants:   General surgery  Interventional radiology  Procedures:   CT GUIDED PLACEMENT OF A 12 FR DRAIN CATHETER (07/07/2020)  Antimicrobials:  Zosyn IV  Augmentin PO   Subjective: Some mild pain of right upper abdomen especially with deep inspiration. No other concerns.  Objective: Vitals:   07/12/20 1519 07/12/20 1955 07/13/20 0500 07/13/20 0638  BP: (!) 121/50 (!) 144/59  (!) 146/53  Pulse: 72 76  67  Resp: 16 16  18   Temp: 98.6 F (37 C) 98.6 F (37 C)  98.4 F (36.9 C)  TempSrc: Oral Oral  Oral  SpO2: 99% 99%  100%  Weight:   84.8 kg     Intake/Output Summary (Last 24 hours) at 07/13/2020 1157 Last data filed at 07/13/2020 0937 Gross per 24 hour  Intake 265 ml  Output 1 ml  Net 264 ml   Filed Weights   07/09/20 0422 07/11/20 0612 07/13/20 0500  Weight: 88.8 kg 84.9 kg 84.8 kg    Examination:  General exam: Appears calm and comfortable Respiratory system: Clear to auscultation. Respiratory effort normal. Cardiovascular system: S1 & S2 heard, RRR. No murmurs, rubs,  gallops or clicks. Gastrointestinal system: Abdomen is nondistended, soft and mildly tender in RUQ. No organomegaly or masses felt. Normal bowel sounds  heard. Central nervous system: Alert and oriented. No focal neurological deficits. Musculoskeletal: No edema. No calf tenderness Skin: No cyanosis. No rashes Psychiatry: Judgement and insight appear normal. Mood & affect appropriate.   Data Reviewed: I have personally reviewed following labs and imaging studies  CBC Lab Results  Component Value Date   WBC 7.1 07/13/2020   RBC 4.24 07/13/2020   HGB 11.5 (L) 07/13/2020   HCT 37.3 07/13/2020   MCV 88.0 07/13/2020   MCH 27.1 07/13/2020   PLT 476 (H) 07/13/2020   MCHC 30.8 07/13/2020   RDW 14.0 07/13/2020   LYMPHSABS 2.4 07/06/2020   MONOABS 1.2 (H) 07/06/2020   EOSABS 0.1 07/06/2020   BASOSABS 0.1 [id number]     Last metabolic panel Lab Results  Component Value Date   NA 139 07/13/2020   K 4.5 07/13/2020   CL 107 07/13/2020   CO2 27 07/13/2020   BUN 5 (L) 07/13/2020   CREATININE 0.85 07/13/2020   GLUCOSE 174 (H) 07/13/2020   GFRNONAA >60 07/13/2020   GFRAA >90 04/09/2012   CALCIUM 9.4 07/13/2020   PHOS 3.8 07/07/2020   PROT 6.4 (L) 07/13/2020   ALBUMIN 2.9 (L) 07/13/2020   BILITOT 0.7 07/13/2020   ALKPHOS 61 07/13/2020   AST 18 07/13/2020   ALT 13 07/13/2020   ANIONGAP 5 07/13/2020    CBG (last 3)  Recent Labs    07/12/20 1941 07/13/20 0642 07/13/20 1113  GLUCAP 235* 128* 163*     GFR: Estimated Creatinine Clearance: 57 mL/min (by C-G formula based on SCr of 0.85 mg/dL).  Coagulation Profile: Recent Labs  Lab 07/07/20 1336  INR 1.1    Recent Results (from the past 240 hour(s))  Culture, blood (routine x 2)     Status: Abnormal (Preliminary result)   Collection Time: 07/06/20 12:33 AM   Specimen: BLOOD  Result Value Ref Range Status   Specimen Description BLOOD SITE NOT SPECIFIED  Final   Special Requests   Final    BOTTLES DRAWN AEROBIC AND ANAEROBIC Blood Culture adequate volume   Culture  Setup Time   Final    GRAM POSITIVE RODS AEROBIC BOTTLE ONLY CRITICAL RESULT CALLED TO, READ BACK BY  AND VERIFIED WITH: PHARM D M.BITONTI ON FX:[id number] AT 1610 BY E.PARRISH    Culture (A)  Final    BACILLUS SPECIES Standardized susceptibility testing for this organism is not available. ISOLATE REFERRED FOR ID ONLY Performed at [facility], [place] [address], [place], [place] [address]    Report Status PENDING  Incomplete  Resp Panel by RT-PCR (Flu A&B, Covid) Nasopharyngeal Swab     Status: None   Collection Time: 07/06/20 11:53 PM   Specimen: Nasopharyngeal Swab; Nasopharyngeal(NP) swabs in vial transport medium  Result Value Ref Range Status   SARS Coronavirus 2 by RT PCR NEGATIVE NEGATIVE Final    Comment: (NOTE) SARS-CoV-2 target nucleic acids are NOT DETECTED.  The SARS-CoV-2 RNA is generally detectable in upper respiratory specimens during the acute phase of infection. The lowest concentration of SARS-CoV-2 viral copies this assay can detect is 138 copies/mL. A negative result does not preclude SARS-Cov-2 infection and should not be used as the sole basis for treatment or other patient management decisions. A negative result may occur with  improper specimen collection/handling, submission of specimen other than nasopharyngeal swab, presence of viral mutation(s) within  the areas targeted by this assay, and inadequate number of viral copies(<138 copies/mL). A negative result must be combined with clinical observations, patient history, and epidemiological information. The expected result is Negative.  Fact Sheet for Patients:  EntrepreneurPulse.com.au  Fact Sheet for Healthcare Providers:  IncredibleEmployment.be  This test is no t yet approved or cleared by the [place] FDA and  has been authorized for detection and/or diagnosis of SARS-CoV-2 by FDA under an Emergency Use Authorization (EUA). This EUA will remain  in effect (meaning this test can be used) for the duration of the COVID-19 declaration under Section 564(b)(1) of  the Act, 21 U.S.C.section 360bbb-3(b)(1), unless the authorization is terminated  or revoked sooner.       Influenza A by PCR NEGATIVE NEGATIVE Final   Influenza B by PCR NEGATIVE NEGATIVE Final    Comment: (NOTE) The Xpert Xpress SARS-CoV-2/FLU/RSV plus assay is intended as an aid in the diagnosis of influenza from Nasopharyngeal swab specimens and should not be used as a sole basis for treatment. Nasal washings and aspirates are unacceptable for Xpert Xpress SARS-CoV-2/FLU/RSV testing.  Fact Sheet for Patients: EntrepreneurPulse.com.au  Fact Sheet for Healthcare Providers: IncredibleEmployment.be  This test is not yet approved or cleared by the [place] FDA and has been authorized for detection and/or diagnosis of SARS-CoV-2 by FDA under an Emergency Use Authorization (EUA). This EUA will remain in effect (meaning this test can be used) for the duration of the COVID-19 declaration under Section 564(b)(1) of the Act, 21 U.S.C. section 360bbb-3(b)(1), unless the authorization is terminated or revoked.  Performed at [facility], [place] [address], [place], [place] [address]   Culture, blood (routine x 2)     Status: None   Collection Time: 07/07/20  4:26 AM   Specimen: BLOOD RIGHT HAND  Result Value Ref Range Status   Specimen Description BLOOD RIGHT HAND  Final   Special Requests   Final    BOTTLES DRAWN AEROBIC AND ANAEROBIC Blood Culture adequate volume   Culture   Final    NO GROWTH 5 DAYS Performed at [facility], [place] [address], [place], [place] [address]    Report Status 07/12/2020 FINAL  Final  Aerobic/Anaerobic Culture (surgical/deep wound)     Status: None   Collection Time: 07/07/20  3:57 PM   Specimen: Abscess  Result Value Ref Range Status   Specimen Description ABSCESS  Final   Special Requests DRAIN  Final   Gram Stain   Final    FEW WBC PRESENT,BOTH PMN AND MONONUCLEAR NO ORGANISMS SEEN    Culture    Final    No growth aerobically or anaerobically. Performed at [facility], [place] [address] [place], [place] [address]    Report Status 07/12/2020 FINAL  Final        Radiology Studies: ECHOCARDIOGRAM COMPLETE  Result Date: 07/11/2020    ECHOCARDIOGRAM REPORT   Patient Name:   SHAWNI VOLKOV Date of Exam: 07/11/2020 Medical Rec #:  [id number]              Height:       66.0 in Accession #:    [id number]             Weight:       187.2 lb Date of Birth:  September 27, 1940             BSA:          1.944 m Patient Age:  79 years               BP:           131/51 mmHg Patient Gender: F                      HR:           69 bpm. Exam Location:  Inpatient Procedure: 2D Echo, Cardiac Doppler and Color Doppler Indications:    Bacteremia R 78.81  History:        Patient has no prior history of Echocardiogram examinations.                 Gallbladder surgery.  Sonographer:    Merrie Roof RDCS Referring Phys: [place]  1. Left ventricular ejection fraction, by estimation, is 60 to 65%. The left ventricle has normal function. The left ventricle has no regional wall motion abnormalities. There is mild left ventricular hypertrophy. Left ventricular diastolic parameters are consistent with Grade I diastolic dysfunction (impaired relaxation).  2. Right ventricular systolic function is normal. The right ventricular size is normal.  3. The mitral valve is abnormal. Significant thickening of anterior leaflet. Trivial mitral valve regurgitation. No evidence of mitral stenosis.  4. The aortic valve is tricuspid. Aortic valve regurgitation is not visualized. Mild aortic valve stenosis. Vmax 2.2 m/s, MG 33mmHg, AVA 1.5 cm^2, DI 0.48. Conclusion(s)/Recommendation(s): No clear vegetation seen, but significant thickening of anterior mitral valve leaflet. Trivial MR. If clinical suspicion for endocarditis, recommend TEE. FINDINGS  Left Ventricle: Left ventricular ejection fraction, by  estimation, is 60 to 65%. The left ventricle has normal function. The left ventricle has no regional wall motion abnormalities. The left ventricular internal cavity size was normal in size. There is  mild left ventricular hypertrophy. Left ventricular diastolic parameters are consistent with Grade I diastolic dysfunction (impaired relaxation). Right Ventricle: The right ventricular size is normal. No increase in right ventricular wall thickness. Right ventricular systolic function is normal. Left Atrium: Left atrial size was normal in size. Right Atrium: Right atrial size was normal in size. Pericardium: There is no evidence of pericardial effusion. Mitral Valve: The mitral valve is abnormal. There is moderate thickening of the mitral valve leaflet(s). Trivial mitral valve regurgitation. No evidence of mitral valve stenosis. Tricuspid Valve: The tricuspid valve is normal in structure. Tricuspid valve regurgitation is trivial. Aortic Valve: The aortic valve is tricuspid. Aortic valve regurgitation is not visualized. Mild aortic stenosis is present. Aortic valve mean gradient measures 9.0 mmHg. Aortic valve peak gradient measures 18.7 mmHg. Aortic valve area, by VTI measures 1.50 cm. Pulmonic Valve: The pulmonic valve was not well visualized. Pulmonic valve regurgitation is not visualized. Aorta: The aortic root and ascending aorta are structurally normal, with no evidence of dilitation. IAS/Shunts: The interatrial septum was not well visualized.  LEFT VENTRICLE PLAX 2D LVIDd:         4.50 cm  Diastology LVIDs:         2.90 cm  LV e' medial:    7.51 cm/s LV PW:         1.00 cm  LV E/e' medial:  9.6 LV IVS:        0.80 cm  LV e' lateral:   8.81 cm/s LVOT diam:     2.00 cm  LV E/e' lateral: 8.2 LV SV:         69 LV SV Index:   36 LVOT Area:  3.14 cm  RIGHT VENTRICLE RV Basal diam:  3.20 cm RV S prime:     13.20 cm/s TAPSE (M-mode): 2.2 cm LEFT ATRIUM             Index       RIGHT ATRIUM           Index LA diam:         4.20 cm 2.16 cm/m  RA Area:     10.90 cm LA Vol (A2C):   67.6 ml 34.77 ml/m RA Volume:   21.70 ml  11.16 ml/m LA Vol (A4C):   42.6 ml 21.91 ml/m LA Biplane Vol: 55.7 ml 28.65 ml/m  AORTIC VALVE AV Area (Vmax):    1.47 cm AV Area (Vmean):   1.51 cm AV Area (VTI):     1.50 cm AV Vmax:           216.00 cm/s AV Vmean:          140.000 cm/s AV VTI:            0.460 m AV Peak Grad:      18.7 mmHg AV Mean Grad:      9.0 mmHg LVOT Vmax:         101.00 cm/s LVOT Vmean:        67.100 cm/s LVOT VTI:          0.220 m LVOT/AV VTI ratio: 0.48  AORTA Ao Root diam: 2.80 cm Ao Asc diam:  2.70 cm MITRAL VALVE                TRICUSPID VALVE MV Area (PHT): 3.89 cm     TR Peak grad:   23.6 mmHg MV Decel Time: 195 msec     TR Vmax:        243.00 cm/s MV E velocity: 72.20 cm/s MV A velocity: 109.00 cm/s  SHUNTS MV E/A ratio:  0.66         Systemic VTI:  0.22 m                             Systemic Diam: 2.00 cm Oswaldo Milian MD Electronically signed by Oswaldo Milian MD Signature Date/Time: 07/11/2020/5:40:13 PM    Final         Scheduled Meds: . amLODipine  5 mg Oral Daily  . amoxicillin-clavulanate  1 tablet Oral Q12H  . atorvastatin  20 mg Oral Daily  . enoxaparin (LOVENOX) injection  40 mg Subcutaneous Q24H  . insulin aspart  0-9 Units Subcutaneous TID WC  . insulin detemir  8 Units Subcutaneous BID  . pantoprazole  40 mg Oral Daily  . senna-docusate  2 tablet Oral QHS  . sodium chloride flush  5 mL Intracatheter Q8H   Continuous Infusions:    LOS: 7 days     Cordelia Poche, MD [facility] 07/13/2020, 11:57 AM  If 7PM-7AM, please contact night-coverage [email or web address]

## 2020-07-13 NOTE — Progress Notes (Signed)
Physical Therapy Treatment Patient Details Name: Sandra Washington MRN: [id number] DOB: 11/09/1940 Today's Date: 07/13/2020    History of Present Illness Pt is an 80 yo female presenting 5/10 from PCP due to continued abdominal pain, nausea, vomiting, and weakness after gallbladder removal on 5/5.  Upon workup, pt found to have gallbladder fossa abcess, started on antibiotics. s/p drain placement 5/11. PMH includes: CKD, DM II, HTN, and stroke.    PT Comments    Pt attempting to get OOB to go to bathroom on PT arrival to room. Pt overall requiring close guard to supervision level of assist for safety during mobility, and PT progressed pt to gait training without RW which pt tolerated well. Pt remains slowed and effortful during mobility, but improving each session. Will continue to follow.    Follow Up Recommendations  Home health PT;Supervision for mobility/OOB     Equipment Recommendations  Rolling walker with 5" wheels;3in1 (PT)    Recommendations for Other Services       Precautions / Restrictions Precautions Precautions: Fall (moderate) Precaution Comments: x1 fall, percutaneous drain right side Restrictions Weight Bearing Restrictions: No    Mobility  Bed Mobility Overal bed mobility: Needs Assistance Bed Mobility: Supine to Sit     Supine to sit: Supervision;HOB elevated     General bed mobility comments: for safety, increased time    Transfers Overall transfer level: Needs assistance Equipment used: None Transfers: Sit to/from Stand Sit to Stand: Min guard         General transfer comment: for safety, slow to rise and steady.  Ambulation/Gait Ambulation/Gait assistance: Supervision;Min guard Gait Distance (Feet): 240 Feet Assistive device: None Gait Pattern/deviations: Decreased stride length;Trunk flexed;Step-through pattern Gait velocity: slowed   General Gait Details: min guard for safety, transitioning to supervision with improving pt  confidence. Verbal cuing for upright posture, avoiding reaching for environment to steady.   Stairs             Wheelchair Mobility    Modified Rankin (Stroke Patients Only)       Balance Overall balance assessment: Needs assistance Sitting-balance support: No upper extremity supported;Feet supported Sitting balance-Leahy Scale: Good     Standing balance support: No upper extremity supported;During functional activity Standing balance-Leahy Scale: Good Standing balance comment: ambulatory without AD, accepts mild challenge                            Cognition Arousal/Alertness: Awake/alert Behavior During Therapy: WFL for tasks assessed/performed Overall Cognitive Status: Within Functional Limits for tasks assessed                                        Exercises      General Comments General comments (skin integrity, edema, etc.): DOE 2/4, SpO2 94% on RA      Pertinent Vitals/Pain Pain Assessment: Faces Faces Pain Scale: Hurts a little bit Pain Location: R drain site Pain Descriptors / Indicators: Other (Comment);Discomfort (pulling) Pain Intervention(s): Limited activity within patient's tolerance;Monitored during session;Repositioned    Home Living                      Prior Function            PT Goals (current goals can now be found in the care plan section) Acute Rehab PT Goals Patient Stated Goal: to  go home PT Goal Formulation: With patient Time For Goal Achievement: 07/22/20 Potential to Achieve Goals: Good Progress towards PT goals: Progressing toward goals    Frequency    Min 3X/week      PT Plan Current plan remains appropriate    Co-evaluation              AM-PAC PT "6 Clicks" Mobility   Outcome Measure  Help needed turning from your back to your side while in a flat bed without using bedrails?: None Help needed moving from lying on your back to sitting on the side of a flat bed  without using bedrails?: None Help needed moving to and from a bed to a chair (including a wheelchair)?: A Little Help needed standing up from a chair using your arms (e.g., wheelchair or bedside chair)?: None Help needed to walk in hospital room?: A Little Help needed climbing 3-5 steps with a railing? : A Little 6 Click Score: 21    End of Session   Activity Tolerance: Patient tolerated treatment well Patient left: in chair;with call bell/phone within reach Nurse Communication: Mobility status PT Visit Diagnosis: Other abnormalities of gait and mobility (R26.89);Muscle weakness (generalized) (M62.81);Pain Pain - Right/Left: Right Pain - part of body:  (abdomen)     Time: 6195-0932 PT Time Calculation (min) (ACUTE ONLY): 20 min  Charges:  $Gait Training: 8-22 mins                     Stacie Glaze, PT DPT Acute Rehabilitation Services Pager [phone]  Office [phone]  [place] 07/13/2020, 11:21 AM

## 2020-07-13 NOTE — Progress Notes (Addendum)
Occupational Therapy Treatment Patient Details Name: Sandra Washington MRN: [id number] DOB: [birth year] Today's Date: 07/13/2020    History of present illness Pt is an 80 yo female presenting 5/10 from PCP due to continued abdominal pain, nausea, vomiting, and weakness after gallbladder removal on 5/5.  Upon workup, pt found to have gallbladder fossa abcess, started on antibiotics. s/p drain placement 5/11. PMH includes: CKD, DM II, HTN, and stroke.   OT comments  Pt doing very well, eager to participate with therapy. Pt did not have ADL needs this session, focused on functional tranfers from low surface (low sofa in room), and increased ambulation for endurance. Pt completed 3x sit<>stand transfer from low surface without device and min guard for safety. Pt ambulated ~50 ft with rw in hall, with reports of feeling fatigued and some pain at drain exit site. Drain intact and clean, VSS on RA. Pt continues to benefit from skilled OT to incrase indep and endurance for ADLs and mobility. D/c plan remains appropriate.    Follow Up Recommendations  No OT follow up;Supervision - Intermittent    Equipment Recommendations  3 in 1 bedside commode       Precautions / Restrictions Precautions Precautions: Fall Precaution Comments: x1 fall, percutaneous drain right side Restrictions Weight Bearing Restrictions: No       Mobility Bed Mobility Overal bed mobility: Needs Assistance Bed Mobility: Supine to Sit;Sit to Supine     Supine to sit: Min guard;HOB elevated Sit to supine: Supervision;HOB elevated   General bed mobility comments: for safety, increased time    Transfers Overall transfer level: Needs assistance Equipment used: None Transfers: Sit to/from Stand Sit to Stand: Min guard         General transfer comment: for safety, slow to rise and steady.    Balance Overall balance assessment: Needs assistance Sitting-balance support: No upper extremity supported;Feet  supported Sitting balance-Leahy Scale: Good     Standing balance support: No upper extremity supported;During functional activity Standing balance-Leahy Scale: Good Standing balance comment: ambulatory without AD, accepts mild challenge          ADL either performed or assessed with clinical judgement   ADL Overall ADL's : At baseline;Needs assistance/impaired      Functional mobility during ADLs: Min guard;Rolling walker General ADL Comments: pt with no ADL needs this session with focus on functional transfers from lower surfaces and incrased endurance fro mobility      Cognition Arousal/Alertness: Awake/alert Behavior During Therapy: WFL for tasks assessed/performed Overall Cognitive Status: Within Functional Limits for tasks assessed                      General Comments no changes, VSS on RA    Pertinent Vitals/ Pain       Pain Assessment: Faces Faces Pain Scale: Hurts a little bit Pain Location: R drain site Pain Descriptors / Indicators: Discomfort Pain Intervention(s): Monitored during session         Frequency  Min 2X/week        Progress Toward Goals  OT Goals(current goals can now be found in the care plan section)  Progress towards OT goals: Progressing toward goals  Acute Rehab OT Goals Patient Stated Goal: to go home OT Goal Formulation: With patient Time For Goal Achievement: 07/21/20 Potential to Achieve Goals: Good ADL Goals Additional ADL Goal #1: Pt will state 3 energy conservation techniques for OOB ADL and mobility. Additional ADL Goal #2: Pt will perform OOB ADL x15  mins in standing with 1 seated rest break to increase endurance for ADL.  Plan Discharge plan remains appropriate;Frequency remains appropriate       AM-PAC OT "6 Clicks" Daily Activity     Outcome Measure   Help from another person eating meals?: None Help from another person taking care of personal grooming?: None Help from another person toileting, which  includes using toliet, bedpan, or urinal?: A Little Help from another person bathing (including washing, rinsing, drying)?: A Little Help from another person to put on and taking off regular upper body clothing?: A Little Help from another person to put on and taking off regular lower body clothing?: A Little 6 Click Score: 20    End of Session Equipment Utilized During Treatment: Rolling walker  OT Visit Diagnosis: Unsteadiness on feet (R26.81)   Activity Tolerance Patient tolerated treatment well   Patient Left in bed;with call bell/phone within reach   Nurse Communication Mobility status        Time: 7341-9379 OT Time Calculation (min): 27 min  Charges: OT General Charges $OT Visit: 1 Visit OT Treatments $Therapeutic Exercise: 23-37 mins     Shatonia Hoots A Evadna Donaghy 07/13/2020, 2:15 PM

## 2020-07-13 NOTE — Progress Notes (Signed)
[name]    Date of Admission:  07/06/2020   Total days of antibiotics 8/day 1 of amox/clav           ID: [name] is a 80 y.o. female with Principal Problem:   Intra-abdominal abscess ([place]) Active Problems:   Hypertension, essential   Type 2 diabetes mellitus with hyperlipidemia (HCC)    Subjective: Afebrile, slept well. Working with PT to ambulate down the hall, has shortness of breath with ambulation./exertion  ROS:12 point ros is negative except slight abdominal discomfort at drain exit.  Medications:  . amLODipine  5 mg Oral Daily  . amoxicillin-clavulanate  1 tablet Oral Q12H  . atorvastatin  20 mg Oral Daily  . enoxaparin (LOVENOX) injection  40 mg Subcutaneous Q24H  . insulin aspart  0-9 Units Subcutaneous TID WC  . insulin detemir  8 Units Subcutaneous BID  . pantoprazole  40 mg Oral Daily  . senna-docusate  2 tablet Oral QHS  . sodium chloride flush  5 mL Intracatheter Q8H    Objective: Vital signs in last 24 hours: Temp:  [98.4 F (36.9 C)-98.6 F (37 C)] 98.4 F (36.9 C) (05/17 5176) Pulse Rate:  [67-76] 67 (05/17 0638) Resp:  [16-18] 18 (05/17 1607) BP: (121-146)/(50-59) 146/53 (05/17 3710) SpO2:  [99 %-100 %] 100 % (05/17 6269) Weight:  [84.8 kg] 84.8 kg (05/17 0500) Physical Exam  Constitutional:  oriented to person, place, and time. appears well-developed and well-nourished. No distress.  HENT: [place]/AT, PERRLA, no scleral icterus Mouth/Throat: Oropharynx is clear and moist. No oropharyngeal exudate.  Cardiovascular: Normal rate, regular rhythm and normal heart sounds. Exam reveals no gallop and no friction rub.  No murmur heard.  Pulmonary/Chest: Effort normal and breath sounds normal. No respiratory distress.  has no wheezes.  Neck = supple, no nuchal rigidity Abdominal: Soft. Bowel sounds are normal.  exhibits no distension. Right sided drain with serosanginous fluid Lymphadenopathy: no cervical  adenopathy. No axillary adenopathy Neurological: alert and oriented to person, place, and time.  Skin: Skin is warm and dry. No rash noted. No erythema.  Psychiatric: a normal mood and affect.  behavior is normal.    Lab Results Recent Labs    07/13/20 0430 07/13/20 0933  WBC  --  7.1  HGB  --  11.5*  HCT  --  37.3  NA  --  139  K  --  4.5  CL  --  107  CO2  --  27  BUN  --  5*  CREATININE 0.78 0.85   Liver Panel Recent Labs    07/13/20 0933  PROT 6.4*  ALBUMIN 2.9*  AST 18  ALT 13  ALKPHOS 61  BILITOT 0.7   No results found for: [place], [place]  Microbiology: reviewed Studies/Results: ECHOCARDIOGRAM COMPLETE  Result Date: 07/11/2020    ECHOCARDIOGRAM REPORT   Patient Name:   [name] Date of Exam: 07/11/2020 Medical Rec #:  [id number]              Height:       66.0 in Accession #:    [id number]             Weight:       187.2 lb Date of Birth:  May 31, 1940             BSA:          1.944 m Patient Age:    56 years  BP:           131/51 mmHg Patient Gender: F                      HR:           69 bpm. Exam Location:  Inpatient Procedure: 2D Echo, Cardiac Doppler and Color Doppler Indications:    Bacteremia R 78.81  History:        Patient has no prior history of Echocardiogram examinations.                 Gallbladder surgery.  Sonographer:    Merrie Roof RDCS Referring Phys: [place]  1. Left ventricular ejection fraction, by estimation, is 60 to 65%. The left ventricle has normal function. The left ventricle has no regional wall motion abnormalities. There is mild left ventricular hypertrophy. Left ventricular diastolic parameters are consistent with Grade I diastolic dysfunction (impaired relaxation).  2. Right ventricular systolic function is normal. The right ventricular size is normal.  3. The mitral valve is abnormal. Significant thickening of anterior leaflet. Trivial mitral valve regurgitation. No evidence  of mitral stenosis.  4. The aortic valve is tricuspid. Aortic valve regurgitation is not visualized. Mild aortic valve stenosis. Vmax 2.2 m/s, MG 64mmHg, AVA 1.5 cm^2, DI 0.48. Conclusion(s)/Recommendation(s): No clear vegetation seen, but significant thickening of anterior mitral valve leaflet. Trivial MR. If clinical suspicion for endocarditis, recommend TEE. FINDINGS  Left Ventricle: Left ventricular ejection fraction, by estimation, is 60 to 65%. The left ventricle has normal function. The left ventricle has no regional wall motion abnormalities. The left ventricular internal cavity size was normal in size. There is  mild left ventricular hypertrophy. Left ventricular diastolic parameters are consistent with Grade I diastolic dysfunction (impaired relaxation). Right Ventricle: The right ventricular size is normal. No increase in right ventricular wall thickness. Right ventricular systolic function is normal. Left Atrium: Left atrial size was normal in size. Right Atrium: Right atrial size was normal in size. Pericardium: There is no evidence of pericardial effusion. Mitral Valve: The mitral valve is abnormal. There is moderate thickening of the mitral valve leaflet(s). Trivial mitral valve regurgitation. No evidence of mitral valve stenosis. Tricuspid Valve: The tricuspid valve is normal in structure. Tricuspid valve regurgitation is trivial. Aortic Valve: The aortic valve is tricuspid. Aortic valve regurgitation is not visualized. Mild aortic stenosis is present. Aortic valve mean gradient measures 9.0 mmHg. Aortic valve peak gradient measures 18.7 mmHg. Aortic valve area, by VTI measures 1.50 cm. Pulmonic Valve: The pulmonic valve was not well visualized. Pulmonic valve regurgitation is not visualized. Aorta: The aortic root and ascending aorta are structurally normal, with no evidence of dilitation. IAS/Shunts: The interatrial septum was not well visualized.  LEFT VENTRICLE PLAX 2D LVIDd:         4.50 cm   Diastology LVIDs:         2.90 cm  LV e' medial:    7.51 cm/s LV PW:         1.00 cm  LV E/e' medial:  9.6 LV IVS:        0.80 cm  LV e' lateral:   8.81 cm/s LVOT diam:     2.00 cm  LV E/e' lateral: 8.2 LV SV:         69 LV SV Index:   36 LVOT Area:     3.14 cm  RIGHT VENTRICLE RV Basal diam:  3.20 cm RV S  prime:     13.20 cm/s TAPSE (M-mode): 2.2 cm LEFT ATRIUM             Index       RIGHT ATRIUM           Index LA diam:        4.20 cm 2.16 cm/m  RA Area:     10.90 cm LA Vol (A2C):   67.6 ml 34.77 ml/m RA Volume:   21.70 ml  11.16 ml/m LA Vol (A4C):   42.6 ml 21.91 ml/m LA Biplane Vol: 55.7 ml 28.65 ml/m  AORTIC VALVE AV Area (Vmax):    1.47 cm AV Area (Vmean):   1.51 cm AV Area (VTI):     1.50 cm AV Vmax:           216.00 cm/s AV Vmean:          140.000 cm/s AV VTI:            0.460 m AV Peak Grad:      18.7 mmHg AV Mean Grad:      9.0 mmHg LVOT Vmax:         101.00 cm/s LVOT Vmean:        67.100 cm/s LVOT VTI:          0.220 m LVOT/AV VTI ratio: 0.48  AORTA Ao Root diam: 2.80 cm Ao Asc diam:  2.70 cm MITRAL VALVE                TRICUSPID VALVE MV Area (PHT): 3.89 cm     TR Peak grad:   23.6 mmHg MV Decel Time: 195 msec     TR Vmax:        243.00 cm/s MV E velocity: 72.20 cm/s MV A velocity: 109.00 cm/s  SHUNTS MV E/A ratio:  0.66         Systemic VTI:  0.22 m                             Systemic Diam: 2.00 cm Oswaldo Milian MD Electronically signed by Oswaldo Milian MD Signature Date/Time: 07/11/2020/5:40:13 PM    Final      Assessment/Plan: igallbladder fossa abscess = continue with amox/clav 875mg  BID for now for additional 1-2 wk. Will need to touch base with IR when to do repeat scan for evaluation of drain removal  -will check sed rate and crp  Bacillus bacteremia =would be covered with current abtx. Will check repeat blood cx to ensure clearance. Still question remains potentially contaminant vs transient bacteremia.  [facility] Cell: [phone] Pager: [phone]  07/13/2020, 1:37 PM

## 2020-07-14 ENCOUNTER — Inpatient Hospital Stay (HOSPITAL_COMMUNITY): Payer: Medicare HMO

## 2020-07-14 DIAGNOSIS — K651 Peritoneal abscess: Secondary | ICD-10-CM | POA: Diagnosis not present

## 2020-07-14 LAB — GLUCOSE, CAPILLARY
Glucose-Capillary: 166 mg/dL — ABNORMAL HIGH (ref 70–99)
Glucose-Capillary: 197 mg/dL — ABNORMAL HIGH (ref 70–99)
Glucose-Capillary: 134 mg/dL — ABNORMAL HIGH (ref 70–99)
Glucose-Capillary: 237 mg/dL — ABNORMAL HIGH (ref 70–99)

## 2020-07-14 LAB — CBC
HCT: 35.3 % — ABNORMAL LOW (ref 36.0–46.0)
Hemoglobin: 11.1 g/dL — ABNORMAL LOW (ref 12.0–15.0)
MCH: 27.3 pg (ref 26.0–34.0)
MCHC: 31.4 g/dL (ref 30.0–36.0)
MCV: 86.7 fL (ref 80.0–100.0)
Platelets: 442 10*3/uL — ABNORMAL HIGH (ref 150–400)
RBC: 4.07 MIL/uL (ref 3.87–5.11)
RDW: 13.9 % (ref 11.5–15.5)
WBC: 7 10*3/uL (ref 4.0–10.5)
nRBC: 0 % (ref 0.0–0.2)

## 2020-07-14 LAB — BASIC METABOLIC PANEL
Anion gap: 5 (ref 5–15)
BUN: 7 mg/dL — ABNORMAL LOW (ref 8–23)
CO2: 24 mmol/L (ref 22–32)
Calcium: 9.5 mg/dL (ref 8.9–10.3)
Chloride: 108 mmol/L (ref 98–111)
Creatinine, Ser: 0.73 mg/dL (ref 0.44–1.00)
GFR, Estimated: >60 mL/min (ref >60)
Glucose, Bld: 232 mg/dL — ABNORMAL HIGH (ref 70–99)
Potassium: 4.2 mmol/L (ref 3.5–5.1)
Sodium: 137 mmol/L (ref 135–145)

## 2020-07-14 LAB — C-REACTIVE PROTEIN: CRP: <0.5 mg/dL (ref <1.0)

## 2020-07-14 LAB — SEDIMENTATION RATE: Sed Rate: 39 mm/hr — ABNORMAL HIGH (ref 0–22)

## 2020-07-14 NOTE — Progress Notes (Addendum)
[facility] for Infectious Disease    Date of Admission:  07/06/2020      ID: Sandra Washington is a 80 y.o. female with   Principal Problem:   Intra-abdominal abscess ([place]) Active Problems:   Hypertension, essential   Type 2 diabetes mellitus with hyperlipidemia (HCC)    Subjective: Afebrile, slept well, no difficulties with eating--however, she had episode of nausea with taking oral abtx this morning. She thinks it is due to size of pill  Also had repeat CT this morning that showed:  right subhepatic complex fluid collection which now measures approximately 4.2 x 2.3 x 2.4 cm (previously 7.6 x 6.7 x 7.2 cm) and contains foci of gas  Medications:  . amLODipine  5 mg Oral Daily  . amoxicillin-clavulanate  1 tablet Oral Q12H  . atorvastatin  20 mg Oral Daily  . enoxaparin (LOVENOX) injection  40 mg Subcutaneous Q24H  . insulin aspart  0-9 Units Subcutaneous TID WC  . insulin detemir  8 Units Subcutaneous BID  . pantoprazole  40 mg Oral Daily  . senna-docusate  2 tablet Oral QHS  . sodium chloride flush  5 mL Intracatheter Q8H    Objective: Vital signs in last 24 hours: Temp:  [97.8 F (36.6 C)-98.6 F (37 C)] 97.8 F (36.6 C) (05/18 0750) Pulse Rate:  [62-70] 62 (05/18 0750) Resp:  [17-20] 17 (05/18 0750) BP: (129-138)/(54-59) 129/56 (05/18 0750) SpO2:  [99 %-100 %] 100 % (05/18 0750) Weight:  [84.1 kg] 84.1 kg (05/18 0500) Physical Exam  Constitutional:  oriented to person, place, and time. appears well-developed and well-nourished. No distress.  HENT: [place]/AT, PERRLA, no scleral icterus Mouth/Throat: Oropharynx is clear and moist. No oropharyngeal exudate.  Cardiovascular: Normal rate, regular rhythm and normal heart sounds. Exam reveals no gallop and no friction rub.  No murmur heard.  Pulmonary/Chest: Effort normal and breath sounds normal. No respiratory distress.  has no wheezes.  Neck = supple, no nuchal rigidity Abdominal: Soft. Bowel sounds are  normal.  exhibits no distension. There is no tenderness. serosanginous fluid 31mL in collection system Lymphadenopathy: no cervical adenopathy. No axillary adenopathy Neurological: alert and oriented to person, place, and time.  Skin: Skin is warm and dry. No rash noted. No erythema.  Psychiatric: a normal mood and affect.  behavior is normal.    Lab Results Recent Labs    07/13/20 0933 07/14/20 0303  WBC 7.1 7.0  HGB 11.5* 11.1*  HCT 37.3 35.3*  NA 139 137  K 4.5 4.2  CL 107 108  CO2 27 24  BUN 5* 7*  CREATININE 0.85 0.73   Liver Panel Recent Labs    07/13/20 0933  PROT 6.4*  ALBUMIN 2.9*  AST 18  ALT 13  ALKPHOS 61  BILITOT 0.7   Lab Results  Component Value Date   ESRSEDRATE 73 (H) 07/14/2020    Microbiology: reviewed Studies/Results: CT ABDOMEN PELVIS WO CONTRAST  Result Date: 07/14/2020 CLINICAL DATA:  80 year old female with history of cholecystectomy on 07/01/2020 with postoperative gallbladder fossa fluid collection status post percutaneous drainage on 07/07/2020. EXAM: CT ABDOMEN AND PELVIS WITHOUT CONTRAST TECHNIQUE: Multidetector CT imaging of the abdomen and pelvis was performed following the standard protocol without IV contrast. COMPARISON:  07/06/2020, 07/07/2020 FINDINGS: Lower chest: Resolution of previously visualized small right pleural effusion. There is trace residual right basilar subsegmental atelectasis. Hepatobiliary: Significant interval decreased size of previously visualized right subhepatic complex fluid collection which now measures approximately 4.2 x 2.3 x  2.4 cm (previously 7.6 x 6.7 x 7.2 cm) and contains foci of gas. The indwelling pigtail drain remains well position within the fluid collection from a mid axillary, percutaneous, transhepatic approach. The liver is otherwise normal in contour and attenuation. No evidence of focal mass, intra, or extrahepatic biliary ductal dilation. Status post cholecystectomy. Pancreas: Unremarkable. No  pancreatic ductal dilatation or surrounding inflammatory changes. Spleen: Normal in size without focal abnormality. Adrenals/Urinary Tract: Adrenal glands are unremarkable. Kidneys are normal, without renal calculi, focal lesion, or hydronephrosis. Bladder is unremarkable. Stomach/Bowel: Stomach is within normal limits. Similar appearing duodenal diverticulum arising from the third portion measuring up to approximately 3.7 cm. Appendix appears normal. No evidence of bowel wall thickening, distention, or inflammatory changes. Vascular/Lymphatic: Aortic atherosclerosis. No enlarged abdominal or pelvic lymph nodes. Reproductive: Calcified leiomyomatous uterus, unchanged. No adnexal masses. Other: Scattered injection site granulomas about the lower anterior abdomen. Tiny fat containing umbilical hernia. No ascites. Musculoskeletal: No acute or significant osseous findings. IMPRESSION: 1. Decreased size of previously visualized subhepatic abscess with unchanged position of indwelling transhepatic pigtail drain. 2. Resolution of previously visualized right pleural effusion. There is persistent right basilar subsegmental atelectasis. PLAN: Keep drain in place for now and continue routine flushes. IR will continue to follow. Ruthann Cancer, MD [facility] Electronically Signed   By: Ruthann Cancer MD   On: 07/14/2020 11:48     Assessment/Plan: 01BP F with gallbladder fossa abscess = continue on amox/clav 875mg  BID for additional 12 days. End date of 5/30. May benefit from it being crushed or cut with apple sauce. Still needs drain given size noted on imaging but suspect it will be removed in 1-2 wks-- if continues to improve.  We will see back in clinic in 2 wk  Will sign off.   [facility] Cell: [phone] Pager: [phone]  07/14/2020, 1:35 PM

## 2020-07-14 NOTE — Progress Notes (Signed)
Referring Physician(s): Dr. Nevada Crane, C.   Supervising Physician: Ruthann Cancer  Patient Status:  [facility] - In-pt  Chief Complaint:  S/p gallbladder fossa fluid collection drain placement on 5/11 with Dr. Pascal Lux   Subjective:  Pt laying on bed comfortably.  Has no abdominal complaints.  Patient underwent for follow up CT A/P this morning.   Allergies: Patient has no known allergies.  Medications: Prior to Admission medications   Medication Sig Start Date End Date Taking? Authorizing Provider  amLODipine (NORVASC) 5 MG tablet Take 5 mg by mouth 2 (two) times daily. 10/19/14  Yes [provider]  atorvastatin (LIPITOR) 20 MG tablet Take 1 tablet (20 mg total) by mouth daily. 12/17/18  Yes Elayne Snare, MD  Cholecalciferol (VITAMIN D3) 5000 UNITS CAPS Take 5,000 Units by mouth daily.   Yes [provider]  insulin NPH Human (NOVOLIN N RELION) 100 UNIT/ML injection 8 U in am and 12 U hs Patient taking differently: Inject 8-12 Units into the skin See admin instructions. Takes 8 units in the morning and 12 units at night 11/06/19  Yes Elayne Snare, MD  insulin regular (NOVOLIN R RELION) 100 units/mL injection INJECT 8 UNITS SUBCUTANEOUSLY WITH BREAKFAST AND LUNCH, AND 16 UNITS WITH DINNER Patient taking differently: Inject 8-16 Units into the skin See admin instructions. Takes 8 units  with breakfast and lunch, and 16 units with dinner 11/06/19  Yes Elayne Snare, MD  niacin (SLO-NIACIN) 500 MG tablet Take 500 mg by mouth at bedtime.   Yes [provider]  omeprazole (PRILOSEC) 40 MG capsule Take 40 mg by mouth daily.   Yes [provider]  ondansetron (ZOFRAN) 4 MG tablet Take 1 tablet (4 mg total) by mouth every 6 (six) hours as needed for nausea. 07/03/20  Yes Sheikh, Omair Latif, DO  oxyCODONE (OXY IR/ROXICODONE) 5 MG immediate release tablet Take 1-2 tablets (5-10 mg total) by mouth every 4 (four) hours as needed for moderate pain. 07/03/20  Yes Sheikh, Omair Latif,  DO  valsartan-hydrochlorothiazide (DIOVAN-HCT) 160-12.5 MG tablet Take 1 tablet by mouth daily.   Yes [provider]  ACCU-CHEK SOFTCLIX LANCETS lancets Use bid 05/01/17   Elayne Snare, MD  Blood Glucose Monitoring Suppl (ACCU-CHEK GUIDE ME) w/Device KIT 1 each by Does not apply route 2 (two) times daily. Use accu chek guide me device to check blood sugar twice daily.DX:E11.65 12/23/18   Elayne Snare, MD  docusate sodium (COLACE) 100 MG capsule Take 1 capsule (100 mg total) by mouth 2 (two) times daily. Patient not taking: No sig reported 07/03/20   Raiford Noble Latif, DO  glucose blood (ACCU-CHEK GUIDE) test strip 1 each by Other route 2 (two) times daily. Use as instructed 12/17/18   Elayne Snare, MD     Vital Signs: BP (!) 129/56 (BP Location: Left Arm)   Pulse 62   Temp 97.8 F (36.6 C) (Oral)   Resp 17   Wt 185 lb 6.5 oz (84.1 kg)   SpO2 100%   BMI 29.93 kg/m   Physical Exam Vitals reviewed.  Constitutional:      General: She is not in acute distress.    Appearance: She is not ill-appearing.  HENT:     Head: Normocephalic and atraumatic.  Cardiovascular:     Rate and Rhythm: Normal rate.  Pulmonary:     Effort: Pulmonary effort is normal.  Abdominal:     General: Abdomen is flat.     Palpations: Abdomen is soft.  Comments: Positive RUQ drain to a gravity bag. Site is unremarkable with no erythema, edema, tenderness, bleeding or drainage. Suture and stat lock in place. Dressing is clean, dry, and intact. 10 ml of clear, blood tinged fluid noted in the gravity bag. Drain aspirates and flushes well.   Skin:    General: Skin is warm and dry.  Neurological:     Mental Status: She is alert and oriented to person, place, and time.  Psychiatric:        Mood and Affect: Mood normal.        Behavior: Behavior normal.    Imaging: ECHOCARDIOGRAM COMPLETE  Result Date: 07/11/2020    ECHOCARDIOGRAM REPORT   Patient Name:   RODNESHA ELIE Date of Exam:  07/11/2020 Medical Rec #:  [id number]              Height:       66.0 in Accession #:    [id number]             Weight:       187.2 lb Date of Birth:  Aug 11, 1940             BSA:          1.944 m Patient Age:    12 years               BP:           131/51 mmHg Patient Gender: F                      HR:           69 bpm. Exam Location:  Inpatient Procedure: 2D Echo, Cardiac Doppler and Color Doppler Indications:    Bacteremia R 78.81  History:        Patient has no prior history of Echocardiogram examinations.                 Gallbladder surgery.  Sonographer:    Merrie Roof RDCS Referring Phys: [place]  1. Left ventricular ejection fraction, by estimation, is 60 to 65%. The left ventricle has normal function. The left ventricle has no regional wall motion abnormalities. There is mild left ventricular hypertrophy. Left ventricular diastolic parameters are consistent with Grade I diastolic dysfunction (impaired relaxation).  2. Right ventricular systolic function is normal. The right ventricular size is normal.  3. The mitral valve is abnormal. Significant thickening of anterior leaflet. Trivial mitral valve regurgitation. No evidence of mitral stenosis.  4. The aortic valve is tricuspid. Aortic valve regurgitation is not visualized. Mild aortic valve stenosis. Vmax 2.2 m/s, MG 65mHg, AVA 1.5 cm^2, DI 0.48. Conclusion(s)/Recommendation(s): No clear vegetation seen, but significant thickening of anterior mitral valve leaflet. Trivial MR. If clinical suspicion for endocarditis, recommend TEE. FINDINGS  Left Ventricle: Left ventricular ejection fraction, by estimation, is 60 to 65%. The left ventricle has normal function. The left ventricle has no regional wall motion abnormalities. The left ventricular internal cavity size was normal in size. There is  mild left ventricular hypertrophy. Left ventricular diastolic parameters are consistent with Grade I diastolic dysfunction (impaired  relaxation). Right Ventricle: The right ventricular size is normal. No increase in right ventricular wall thickness. Right ventricular systolic function is normal. Left Atrium: Left atrial size was normal in size. Right Atrium: Right atrial size was normal in size. Pericardium: There is no evidence of pericardial effusion. Mitral Valve: The mitral valve is abnormal. There  is moderate thickening of the mitral valve leaflet(s). Trivial mitral valve regurgitation. No evidence of mitral valve stenosis. Tricuspid Valve: The tricuspid valve is normal in structure. Tricuspid valve regurgitation is trivial. Aortic Valve: The aortic valve is tricuspid. Aortic valve regurgitation is not visualized. Mild aortic stenosis is present. Aortic valve mean gradient measures 9.0 mmHg. Aortic valve peak gradient measures 18.7 mmHg. Aortic valve area, by VTI measures 1.50 cm. Pulmonic Valve: The pulmonic valve was not well visualized. Pulmonic valve regurgitation is not visualized. Aorta: The aortic root and ascending aorta are structurally normal, with no evidence of dilitation. IAS/Shunts: The interatrial septum was not well visualized.  LEFT VENTRICLE PLAX 2D LVIDd:         4.50 cm  Diastology LVIDs:         2.90 cm  LV e' medial:    7.51 cm/s LV PW:         1.00 cm  LV E/e' medial:  9.6 LV IVS:        0.80 cm  LV e' lateral:   8.81 cm/s LVOT diam:     2.00 cm  LV E/e' lateral: 8.2 LV SV:         69 LV SV Index:   36 LVOT Area:     3.14 cm  RIGHT VENTRICLE RV Basal diam:  3.20 cm RV S prime:     13.20 cm/s TAPSE (M-mode): 2.2 cm LEFT ATRIUM             Index       RIGHT ATRIUM           Index LA diam:        4.20 cm 2.16 cm/m  RA Area:     10.90 cm LA Vol (A2C):   67.6 ml 34.77 ml/m RA Volume:   21.70 ml  11.16 ml/m LA Vol (A4C):   42.6 ml 21.91 ml/m LA Biplane Vol: 55.7 ml 28.65 ml/m  AORTIC VALVE AV Area (Vmax):    1.47 cm AV Area (Vmean):   1.51 cm AV Area (VTI):     1.50 cm AV Vmax:           216.00 cm/s AV Vmean:           140.000 cm/s AV VTI:            0.460 m AV Peak Grad:      18.7 mmHg AV Mean Grad:      9.0 mmHg LVOT Vmax:         101.00 cm/s LVOT Vmean:        67.100 cm/s LVOT VTI:          0.220 m LVOT/AV VTI ratio: 0.48  AORTA Ao Root diam: 2.80 cm Ao Asc diam:  2.70 cm MITRAL VALVE                TRICUSPID VALVE MV Area (PHT): 3.89 cm     TR Peak grad:   23.6 mmHg MV Decel Time: 195 msec     TR Vmax:        243.00 cm/s MV E velocity: 72.20 cm/s MV A velocity: 109.00 cm/s  SHUNTS MV E/A ratio:  0.66         Systemic VTI:  0.22 m                             Systemic Diam: 2.00 cm Oswaldo Milian MD Electronically signed  by Oswaldo Milian MD Signature Date/Time: 07/11/2020/5:40:13 PM    Final     Labs:  CBC: Recent Labs    07/07/20 0238 07/08/20 0128 07/13/20 0933 07/14/20 0303  WBC 10.3 8.8 7.1 7.0  HGB 10.9* 10.4* 11.5* 11.1*  HCT 35.1* 33.3* 37.3 35.3*  PLT 354 376 476* 442*    COAGS: Recent Labs    07/07/20 1336  INR 1.1    BMP: Recent Labs    07/07/20 0238 07/08/20 0128 07/13/20 0430 07/13/20 0933 07/14/20 0303  NA 135 135  --  139 137  K 4.1 3.9  --  4.5 4.2  CL 103 102  --  107 108  CO2 25 24  --  27 24  GLUCOSE 128* 151*  --  174* 232*  BUN 8 7*  --  5* 7*  CALCIUM 8.9 8.7*  --  9.4 9.5  CREATININE 1.06* 0.78 0.78 0.85 0.73  GFRNONAA 53* >60 >60 >60 >60    LIVER FUNCTION TESTS: Recent Labs    07/06/20 1805 07/07/20 0238 07/08/20 0128 07/13/20 0933  BILITOT 0.9 0.7 0.5 0.7  AST 16 13* 14* 18  ALT '23 19 16 13  ' ALKPHOS 81 70 61 61  PROT 6.5 5.3* 5.1* 6.4*  ALBUMIN 2.9* 2.4* 2.3* 2.9*    Assessment and Plan: 80 year old female with history of cholecystectomy on 07/01/2020 presented with fluid collection within the gallbladder fossa, s/p image guided gallbladder fossa fluid collection drain placement with Dr. Pascal Lux on 07/07/2020.   Pt stable OP 10 cc VSS CBC stable  cx showed no growth   Patient underwent follow up CT A/P today which was reviewed  by Dr. Serafina Royals.  Per Dr. Serafina Royals, patient still has air/fluid collection in the gallbladder fossa, the drain to be remained.   Continue with flushing TID, output recording q shift and dressing changes as needed. Would consider additional imaging when output is less than 10 ml for 24 hours not including flush material.    Further treatment plan per TRH/ ID Appreciate and agree with the plan.  IR to follow.    Electronically Signed: Tera Mater, PA-C 07/14/2020, 10:54 AM   I spent a total of 15 Minutes at the the patient's bedside AND on the patient's hospital floor or unit, greater than 50% of which was counseling/coordinating care for gallbladder fossa fluid collection drain

## 2020-07-14 NOTE — Progress Notes (Signed)
PROGRESS NOTE    Sandra Washington  IWP:[id number] DOB: 01/20/1941 DOA: 07/06/2020 PCP: Lucianne Lei, MD   Chief Complaint  Patient presents with  . Post-op Problem  Brief Narrative: 80 year old female with hypertension hyperlipidemia diabetes, GERD presented with right upper quadrant pain nausea vomiting and found to have large intra-abdominal abscess in the gallbladder fossa seen by general surgery IR and underwent percutaneous drain placement by IR.  Managed with IV antibiotics, followed by ID.  Patient had bacteremia and repeat blood culture sent 5/17.  Subjective: Seen/examined this am   Assessment & Plan:  Gallbladder fossa abscess: Appreciate ID and IR input on Augmentin 875 milligrams twice daily for additional likely 1 to 2 weeks.  IR planning for repeat CT abdomen today.  Continue to monitor drain fluid if less than 10 mL excluding the irrigation and will plan for reimaging and drain removal.  Currently afebrile no leukocytosis.  Check sed rate and CRP (ordered) per ID.  Bacillus bacteremia: IV antibiotics will cover.  Repeat blood culture sent 5/17 to ensure clearance.  History question remains potentially contaminant versus transient bacteremia.  ID following  Type 2 diabetes mellitus, A1c 6.4 in March 2022.  Controlled.  Managed with outpatient NPH and regular insulin.  Continue Lantus 8U units twice daily and sliding scale here for now Recent Labs  Lab 07/13/20 1113 07/13/20 1555 07/13/20 2019 07/14/20 0807 07/14/20 1125  GLUCAP 163* 117* 195* 166* 197*   Essential hypertension: Started on amlodipine 5 twice daily.  At home on valsartan HCTZ.  Blood pressures controlled.  GERD on PPI  Overweight with BMI 29.  Will benefit with weight loss.  Diet Order            DIET SOFT Room service appropriate? Yes; Fluid consistency: Thin  Diet effective now                  Patient's Body mass index is 29.93 kg/m. DVT prophylaxis: enoxaparin (LOVENOX) injection  40 mg Start: 07/08/20 0800 SCDs Start: 07/06/20 2256 Code Status:   Code Status: Full Code Family Communication: plan of care discussed with patient at bedside.  Status is: Inpatient Remains inpatient appropriate because:IV treatments appropriate due to intensity of illness or inability to take PO and Inpatient level of care appropriate due to severity of illness  Dispo  Patient From: Home  Planned Disposition: Home with Health Care Svc, plans to return to son's home where there is someone to assist a all the time  Medically stable for discharge: No    Unresulted Labs (From admission, onward)          Start     Ordered   07/13/20 0500  Creatinine, serum  (enoxaparin (LOVENOX)    CrCl >/= 30 ml/min)  Weekly,   R     Comments: while on enoxaparin therapy    07/06/20 2256   07/07/20 0000  Organism ID by Sequencing  Once,   R        07/07/20 0000         Medications reviewed:  Scheduled Meds: . amLODipine  5 mg Oral Daily  . amoxicillin-clavulanate  1 tablet Oral Q12H  . atorvastatin  20 mg Oral Daily  . enoxaparin (LOVENOX) injection  40 mg Subcutaneous Q24H  . insulin aspart  0-9 Units Subcutaneous TID WC  . insulin detemir  8 Units Subcutaneous BID  . pantoprazole  40 mg Oral Daily  . senna-docusate  2 tablet Oral QHS  . sodium chloride  flush  5 mL Intracatheter Q8H   Continuous Infusions:  Consultants:see note  Procedures:see note  Antimicrobials: Anti-infectives (From admission, onward)   Start     Dose/Rate Route Frequency Ordered Stop   07/13/20 1300  amoxicillin-clavulanate (AUGMENTIN) 875-125 MG per tablet 1 tablet        1 tablet Oral Every 12 hours 07/13/20 0833     07/07/20 0600  piperacillin-tazobactam (ZOSYN) IVPB 3.375 g  Status:  Discontinued        3.375 g 12.5 mL/hr over 240 Minutes Intravenous Every 8 hours 07/07/20 0152 07/13/20 0833   07/06/20 2230  piperacillin-tazobactam (ZOSYN) IVPB 3.375 g        3.375 g 100 mL/hr over 30 Minutes Intravenous   Once 07/06/20 2225 07/07/20 0002     Culture/Microbiology    Component Value Date/Time   SDES BLOOD LEFT HAND 07/13/2020 1445   SDES BLOOD LEFT HAND 07/13/2020 1445   SPECREQUEST  07/13/2020 1445    BOTTLES DRAWN AEROBIC AND ANAEROBIC Blood Culture adequate volume   SPECREQUEST  07/13/2020 1445    BOTTLES DRAWN AEROBIC AND ANAEROBIC Blood Culture adequate volume   CULT  07/13/2020 1445    NO GROWTH < 24 HOURS Performed at [facility], [place] [address] [place], [place] [address]    CULT  07/13/2020 1445    NO GROWTH < 24 HOURS Performed at [facility], [place] [address] [place], [place] [address]    REPTSTATUS PENDING 07/13/2020 1445   REPTSTATUS PENDING 07/13/2020 1445    Other culture-see note  Objective: Vitals: Today's Vitals   07/13/20 1401 07/13/20 1930 07/14/20 0500 07/14/20 0750  BP: (!) 138/54 (!) 137/59  (!) 129/56  Pulse: 67 70  62  Resp:  20  17  Temp: 98.6 F (37 C) 98.2 F (36.8 C)  97.8 F (36.6 C)  TempSrc: Oral Oral  Oral  SpO2: 100% 99%  100%  Weight:   84.1 kg   PainSc:  4       Intake/Output Summary (Last 24 hours) at 07/14/2020 0839 Last data filed at 07/13/2020 1500 Gross per 24 hour  Intake 490 ml  Output 10 ml  Net 480 ml   Filed Weights   07/11/20 0612 07/13/20 0500 07/14/20 0500  Weight: 84.9 kg 84.8 kg 84.1 kg   Weight change: -0.7 kg  Intake/Output from previous day: 05/17 0701 - 05/18 0700 In: 490 [P.O.:480] Out: 10 [Drains:10] Intake/Output this shift: No intake/output data recorded. Filed Weights   07/11/20 0612 07/13/20 0500 07/14/20 0500  Weight: 84.9 kg 84.8 kg 84.1 kg    Examination: General exam: AAO x3, ill looking and frail, older than stated age, weak appearing. HEENT:Oral mucosa moist, Ear/Nose WNL grossly,dentition normal. Respiratory system: bilaterally diminished, no crackles no use of accessory muscle, non tender. Cardiovascular system: S1 & S2 +,No JVD. Gastrointestinal system: Abdomen soft,  drain+, mildly tender,ND, BS+. Nervous System:Alert, awake, moving extremities Extremities: no edema, distal peripheral pulses palpable.  Skin: No rashes,no icterus. MSK: Normal muscle bulk,tone, power  Data Reviewed: I have personally reviewed following labs and imaging studies CBC: Recent Labs  Lab 07/08/20 0128 07/13/20 0933 07/14/20 0303  WBC 8.8 7.1 7.0  HGB 10.4* 11.5* 11.1*  HCT 33.3* 37.3 35.3*  MCV 86.0 88.0 86.7  PLT 376 476* [id number]*   Basic Metabolic Panel: Recent Labs  Lab 07/08/20 0128 07/13/20 0430 07/13/20 0933 07/14/20 0303  NA 135  --  139 137  K 3.9  --  4.5 4.2  CL 102  --  107 108  CO2 24  --  27 24  GLUCOSE 151*  --  174* 232*  BUN 7*  --  5* 7*  CREATININE 0.78 0.78 0.85 0.73  CALCIUM 8.7*  --  9.4 9.5   GFR: Estimated Creatinine Clearance: 60.2 mL/min (by C-G formula based on SCr of 0.73 mg/dL). Liver Function Tests: Recent Labs  Lab 07/08/20 0128 07/13/20 0933  AST 14* 18  ALT 16 13  ALKPHOS 61 61  BILITOT 0.5 0.7  PROT 5.1* 6.4*  ALBUMIN 2.3* 2.9*   No results for input(s): LIPASE, AMYLASE in the last 168 hours. No results for input(s): AMMONIA in the last 168 hours. Coagulation Profile: Recent Labs  Lab 07/07/20 1336  INR 1.1   Cardiac Enzymes: No results for input(s): CKTOTAL, CKMB, CKMBINDEX, TROPONINI in the last 168 hours. BNP (last 3 results) No results for input(s): PROBNP in the last 8760 hours. HbA1C: No results for input(s): HGBA1C in the last 72 hours. CBG: Recent Labs  Lab 07/13/20 0642 07/13/20 1113 07/13/20 1555 07/13/20 2019 07/14/20 0807  GLUCAP 128* 163* 117* 195* 166*   Lipid Profile: No results for input(s): CHOL, HDL, LDLCALC, TRIG, CHOLHDL, LDLDIRECT in the last 72 hours. Thyroid Function Tests: No results for input(s): TSH, T4TOTAL, FREET4, T3FREE, THYROIDAB in the last 72 hours. Anemia Panel: No results for input(s): VITAMINB12, FOLATE, FERRITIN, TIBC, IRON, RETICCTPCT in the last 72  hours. Sepsis Labs: No results for input(s): PROCALCITON, LATICACIDVEN in the last 168 hours.  Recent Results (from the past 240 hour(s))  Culture, blood (routine x 2)     Status: Abnormal (Preliminary result)   Collection Time: 07/06/20 12:33 AM   Specimen: BLOOD  Result Value Ref Range Status   Specimen Description BLOOD SITE NOT SPECIFIED  Final   Special Requests   Final    BOTTLES DRAWN AEROBIC AND ANAEROBIC Blood Culture adequate volume   Culture  Setup Time   Final    GRAM POSITIVE RODS AEROBIC BOTTLE ONLY CRITICAL RESULT CALLED TO, READ BACK BY AND VERIFIED WITH: PHARM D M.BITONTI ON UQ:[id number] AT [id number] BY E.PARRISH    Culture (A)  Final    BACILLUS SPECIES Standardized susceptibility testing for this organism is not available. ISOLATE REFERRED FOR ID ONLY Performed at [facility], [place] [address], [place], [place] [address]    Report Status PENDING  Incomplete  Resp Panel by RT-PCR (Flu A&B, Covid) Nasopharyngeal Swab     Status: None   Collection Time: 07/06/20 11:53 PM   Specimen: Nasopharyngeal Swab; Nasopharyngeal(NP) swabs in vial transport medium  Result Value Ref Range Status   SARS Coronavirus 2 by RT PCR NEGATIVE NEGATIVE Final    Comment: (NOTE) SARS-CoV-2 target nucleic acids are NOT DETECTED.  The SARS-CoV-2 RNA is generally detectable in upper respiratory specimens during the acute phase of infection. The lowest concentration of SARS-CoV-2 viral copies this assay can detect is 138 copies/mL. A negative result does not preclude SARS-Cov-2 infection and should not be used as the sole basis for treatment or other patient management decisions. A negative result may occur with  improper specimen collection/handling, submission of specimen other than nasopharyngeal swab, presence of viral mutation(s) within the areas targeted by this assay, and inadequate number of viral copies(<138 copies/mL). A negative result must be combined with clinical observations,  patient history, and epidemiological information. The expected result is Negative.  Fact Sheet for Patients:  EntrepreneurPulse.com.au  Fact Sheet  for Healthcare Providers:  IncredibleEmployment.be  This test is no t yet approved or cleared by the [place] and  has been authorized for detection and/or diagnosis of SARS-CoV-2 by FDA under an Emergency Use Authorization (EUA). This EUA will remain  in effect (meaning this test can be used) for the duration of the COVID-19 declaration under Section 564(b)(1) of the Act, 21 U.S.C.section 360bbb-3(b)(1), unless the authorization is terminated  or revoked sooner.       Influenza A by PCR NEGATIVE NEGATIVE Final   Influenza B by PCR NEGATIVE NEGATIVE Final    Comment: (NOTE) The Xpert Xpress SARS-CoV-2/FLU/RSV plus assay is intended as an aid in the diagnosis of influenza from Nasopharyngeal swab specimens and should not be used as a sole basis for treatment. Nasal washings and aspirates are unacceptable for Xpert Xpress SARS-CoV-2/FLU/RSV testing.  Fact Sheet for Patients: EntrepreneurPulse.com.au  Fact Sheet for Healthcare Providers: IncredibleEmployment.be  This test is not yet approved or cleared by the [place] FDA and has been authorized for detection and/or diagnosis of SARS-CoV-2 by FDA under an Emergency Use Authorization (EUA). This EUA will remain in effect (meaning this test can be used) for the duration of the COVID-19 declaration under Section 564(b)(1) of the Act, 21 U.S.C. section 360bbb-3(b)(1), unless the authorization is terminated or revoked.  Performed at [facility], [place] [address], [place], [place] [address]   Culture, blood (routine x 2)     Status: None   Collection Time: 07/07/20  4:26 AM   Specimen: BLOOD RIGHT HAND  Result Value Ref Range Status   Specimen Description BLOOD RIGHT HAND  Final   Special  Requests   Final    BOTTLES DRAWN AEROBIC AND ANAEROBIC Blood Culture adequate volume   Culture   Final    NO GROWTH 5 DAYS Performed at [facility], [place] [address], [place], [place] [address]    Report Status 07/12/2020 FINAL  Final  Aerobic/Anaerobic Culture (surgical/deep wound)     Status: None   Collection Time: 07/07/20  3:57 PM   Specimen: Abscess  Result Value Ref Range Status   Specimen Description ABSCESS  Final   Special Requests DRAIN  Final   Gram Stain   Final    FEW WBC PRESENT,BOTH PMN AND MONONUCLEAR NO ORGANISMS SEEN    Culture   Final    No growth aerobically or anaerobically. Performed at [facility], [place] [address], [place], [place] [address]    Report Status 07/12/2020 FINAL  Final  Culture, blood (Routine X 2) w Reflex to ID Panel     Status: None (Preliminary result)   Collection Time: 07/13/20  2:45 PM   Specimen: BLOOD LEFT HAND  Result Value Ref Range Status   Specimen Description BLOOD LEFT HAND  Final   Special Requests   Final    BOTTLES DRAWN AEROBIC AND ANAEROBIC Blood Culture adequate volume   Culture   Final    NO GROWTH < 24 HOURS Performed at [facility], [place] [address] [place], [place] [address]    Report Status PENDING  Incomplete  Culture, blood (Routine X 2) w Reflex to ID Panel     Status: None (Preliminary result)   Collection Time: 07/13/20  2:45 PM   Specimen: BLOOD LEFT HAND  Result Value Ref Range Status   Specimen Description BLOOD LEFT HAND  Final   Special Requests   Final    BOTTLES DRAWN AEROBIC  AND ANAEROBIC Blood Culture adequate volume   Culture   Final    NO GROWTH < 24 HOURS Performed at [facility], [place] [address], [place], [place] [address]    Report Status PENDING  Incomplete     Radiology Studies: No results found.   LOS: 8 days   Antonieta Pert, MD [facility]  07/14/2020, 8:39 AM

## 2020-07-15 ENCOUNTER — Ambulatory Visit (INDEPENDENT_AMBULATORY_CARE_PROVIDER_SITE_OTHER): Payer: Medicare HMO | Admitting: General Surgery

## 2020-07-15 DIAGNOSIS — K810 Acute cholecystitis: Secondary | ICD-10-CM

## 2020-07-15 DIAGNOSIS — K651 Peritoneal abscess: Secondary | ICD-10-CM | POA: Diagnosis not present

## 2020-07-15 LAB — BASIC METABOLIC PANEL
Anion gap: 4 — ABNORMAL LOW (ref 5–15)
BUN: 10 mg/dL (ref 8–23)
CO2: 25 mmol/L (ref 22–32)
Calcium: 9.2 mg/dL (ref 8.9–10.3)
Chloride: 108 mmol/L (ref 98–111)
Creatinine, Ser: 0.82 mg/dL (ref 0.44–1.00)
GFR, Estimated: >60 mL/min (ref >60)
Glucose, Bld: 190 mg/dL — ABNORMAL HIGH (ref 70–99)
Potassium: 4.1 mmol/L (ref 3.5–5.1)
Sodium: 137 mmol/L (ref 135–145)

## 2020-07-15 LAB — CBC
HCT: 35 % — ABNORMAL LOW (ref 36.0–46.0)
Hemoglobin: 10.9 g/dL — ABNORMAL LOW (ref 12.0–15.0)
MCH: 26.8 pg (ref 26.0–34.0)
MCHC: 31.1 g/dL (ref 30.0–36.0)
MCV: 86.2 fL (ref 80.0–100.0)
Platelets: 423 10*3/uL — ABNORMAL HIGH (ref 150–400)
RBC: 4.06 MIL/uL (ref 3.87–5.11)
RDW: 14 % (ref 11.5–15.5)
WBC: 6.8 10*3/uL (ref 4.0–10.5)
nRBC: 0 % (ref 0.0–0.2)

## 2020-07-15 LAB — GLUCOSE, CAPILLARY
Glucose-Capillary: 164 mg/dL — ABNORMAL HIGH (ref 70–99)
Glucose-Capillary: 215 mg/dL — ABNORMAL HIGH (ref 70–99)

## 2020-07-15 MED ORDER — AMOXICILLIN-POT CLAVULANATE 875-125 MG PO TABS: 1.0000 | ORAL_TABLET | Freq: Two times a day (BID) | ORAL | 0 refills | Status: DC

## 2020-07-15 MED ORDER — SACCHAROMYCES BOULARDII 250 MG PO CAPS: 250.0000 mg | ORAL_CAPSULE | Freq: Two times a day (BID) | ORAL | 0 refills | Status: AC

## 2020-07-15 MED ORDER — AMOXICILLIN-POT CLAVULANATE 875-125 MG PO TABS: 1.0000 | ORAL_TABLET | Freq: Two times a day (BID) | ORAL | 0 refills | Status: AC

## 2020-07-15 MED ORDER — SACCHAROMYCES BOULARDII 250 MG PO CAPS: 250.0000 mg | ORAL_CAPSULE | Freq: Two times a day (BID) | ORAL | 0 refills | Status: DC

## 2020-07-15 NOTE — Care Management Important Message (Signed)
Important Message  Patient Details  Name: Sandra Washington MRN: [id number] Date of Birth: 03/21/1940   Medicare Important Message Given:  Yes     Delorse Lek 07/15/2020, 2:39 PM

## 2020-07-15 NOTE — Progress Notes (Signed)
   07/15/20 1343   PT - Assessment/Plan  PT equipment 3in1 (PT);Other (comment) (4 wheeled rolling walker with a seat aka rollator.)  Erasmo Leventhal , PTA Acute Rehabilitation Services Pager [phone] Office [phone]

## 2020-07-15 NOTE — Consult Note (Signed)
   [facility] Inpatient Consult   07/15/2020  Sandra Washington [birth year] [id number]   [facility] [ACO] Patient: [name] Medicare   Patient screened for hospitalization with noted less than 7 days for unplanned readmission and length of stay  to assess for potential [name] Management service needs for post hospital transition.  Review of patient's medical record reveals patient is for home with home health noted from 07/08/20 TOC for [name]. Reviewed PT/OT recommendations for DME  Primary Care Provider:  Lucianne Lei, MD listed and this provider office is listed to provide the transition of care follow up.  Call place to patient with no answer at this time.  Plan: Will refer to [facility] team for care management follow up for complex care management.  For questions contact:   Natividad Brood, RN BSN [facility] Liaison  [phone] business mobile phone Toll free office [phone]  Fax number: [phone] [email or web address]

## 2020-07-15 NOTE — Progress Notes (Signed)
[facility]  I am calling the patient for post operative evaluation. This is not a billable encounter as it is under the [place] charges for the surgery.  The patient had a laparoscopic on 07/01/2020 for a gangrenous gallbladder.   She went home on 5/7 with Augmentin and unfortunately presented to [facility] with a gallbladder fossa abscess and bacteremia. She is being discharged today with an IR drain and following up with IR and ID.    She says she is feeling better but needs her education on her drain etc.  She is going to go home and stay with her son from the hospital. I will call and check on her next Thursday to make sure she is doing ok and she has the office number to call if she has any issues.   Pathology: FINAL MICROSCOPIC DIAGNOSIS:   A. GALLBLADDER, CHOLECYSTECTOMY:  - Acute cholecystitis with transmural necrosis.  - Cholelithiasis.  - Benign lymph node.     Curlene Labrum, MD [facility] [address] [place], [place] [phone] (office)

## 2020-07-15 NOTE — Plan of Care (Signed)

## 2020-07-15 NOTE — Discharge Summary (Signed)
Physician Discharge Summary  Sandra Washington PNT:[id number] DOB: 06/30/1940 DOA: 07/06/2020  PCP: Lucianne Lei, MD  Admit date: 07/06/2020 Discharge date: 07/15/2020  Admitted From: home Disposition:  home  Recommendations for Outpatient Follow-up:  1. Follow up with PCP in 1-2 weeks 2. Please obtain BMP/CBC in one week 3. Please follow up on the following pending results:  Home Health:yes  Equipment/Devices: drain and equipments.  Discharge Condition: Stable Code Status:   Code Status: Full Code Diet recommendation:  Diet Order            Diet - low sodium heart healthy           DIET SOFT Room service appropriate? Yes; Fluid consistency: Thin  Diet effective now                  Brief/Interim Summary: 80 year old female with hypertension hyperlipidemia diabetes, GERD presented with right upper quadrant pain nausea vomiting and found to have large intra-abdominal abscess in the gallbladder fossa seen by general surgery IR and underwent percutaneous drain placement by IR.  Managed with IV antibiotics, followed by ID.  Patient had bacteremia and repeat blood culture sent 5/17.  Blood culture has remained negative.  Patient remains clinically stable.  She had repeat CT abdomen done 5.18-that showed a right subhepatic complex fluid collection now measuring approximately  4.2 x 2.3 x 2.4 cm (previously 7.6 x 6.7 x 7.2 cm) and contains foci of gas. ID had signed off advised to continue Augmentin for 12 more days 8 date of 5/30 benefit by using crushed or coated applesauce continue to drain likely for another 1 to 2 weeks and will need follow-up with IR.  She will follow-up with ID clinic in 2 weeks.  Discharge Diagnoses:  Gallbladder fossa abscess: Repeat CT done 07/14/20: that showed a right subhepatic complex fluid collection now measuring approximately  4.2 x 2.3 x 2.4 cm (previously 7.6 x 6.7 x 7.2 cm) and contains foci of gas. ID had signed off advised to continue Augmentin  for 12 more days end date of 5/30, will benefit by using crushed or cut in applesauce,  continue Drain likely for another 1 to 2 weeks and will need follow-up with IR.  She will follow-up with ID clinic in 2 weeks.  CRP is normal.  Currently no fever no leukocytosis. Per IR: Continue with flushing TID, output recording q shift and dressing changes as needed. Would consider additional imaging when output is less than 10 ml for 24 hours not including flush material. Recent Labs  Lab 07/13/20 0933 07/14/20 0303 07/15/20 0425  WBC 7.1 7.0 6.8   Bacillus bacteremia: IV antibiotics will cover.  Repeat blood culture sent 5/17 sent to ensure clearance.   question remains potentially contaminant versus transient bacteremia.  ID following  Type 2 diabetes mellitus, A1c 6.4 in March 2022.  Controlled.  Managed with outpatient NPH and regular insulin.  Resume her home regimen on discharge and follow-up with PCP  Recent Labs  Lab 07/14/20 0807 07/14/20 1125 07/14/20 1605 07/14/20 2021 07/15/20 0645  GLUCAP 166* 197* 134* 237* 164*   Essential hypertension: Well-controlled continue on amlodipine 5 twice daily. home valsartan HCTZ-remains on hold and discontinued, she will follow-up with PCP.    GERD on PPI  Overweight with BMI 29.  Will benefit with weight loss.  Consults:  Infectious disease, IR  Subjective: Alert awake oriented resting comfortably no abdominal pain.  Wants to go home today.  ID has signed off.  Discharge Exam: Vitals:   07/14/20 2035 07/15/20 0759  BP: 114/70 (!) 132/52  Pulse: 75 70  Resp: 15 16  Temp: 98.3 F (36.8 C) 98.8 F (37.1 C)  SpO2: 100% 99%   General: Pt is alert, awake, not in acute distress Cardiovascular: RRR, S1/S2 +, no rubs, no gallops Respiratory: CTA bilaterally, no wheezing, no rhonchi Abdominal: Soft, NT, ND, bowel sounds + Extremities: no edema, no cyanosis  Discharge Instructions  Discharge Instructions    Diet - low sodium heart  healthy   Complete by: As directed    Discharge instructions   Complete by: As directed    Follow-up with primary care doctor for labs in a week.  Follow-up W/ ID clinic Dr.Snider in 2 weeks call the office in few days,  follow-up with drain clinic with intervention radiology  Please call call MD or return to ER for similar or worsening recurring problem that brought you to hospital or if any fever,nausea/vomiting,abdominal pain, uncontrolled pain, chest pain,  shortness of breath or any other alarming symptoms.  Please follow-up your doctor as instructed in a week time and call the office for appointment.  Please avoid alcohol, smoking, or any other illicit substance and maintain healthy habits including taking your regular medications as prescribed.  You were cared for by a hospitalist during your hospital stay. If you have any questions about your discharge medications or the care you received while you were in the hospital after you are discharged, you can call the unit and ask to speak with the hospitalist on call if the hospitalist that took care of you is not available.  Once you are discharged, your primary care physician will handle any further medical issues. Please note that NO REFILLS for any discharge medications will be authorized once you are discharged, as it is imperative that you return to your primary care physician (or establish a relationship with a primary care physician if you do not have one) for your aftercare needs so that they can reassess your need for medications and monitor your lab values   Discharge wound care:   Complete by: As directed    Reinforce dressing as needed, follow-up wound care as per drain clinic IR instruction   Increase activity slowly   Complete by: As directed      Allergies as of 07/15/2020   No Known Allergies     Medication List    STOP taking these medications   docusate sodium 100 MG capsule Commonly known as: COLACE    valsartan-hydrochlorothiazide 160-12.5 MG tablet Commonly known as: DIOVAN-HCT     TAKE these medications   Accu-Chek Guide Me w/Device Kit 1 each by Does not apply route 2 (two) times daily. Use accu chek guide me device to check blood sugar twice daily.DX:E11.65   Accu-Chek Guide test strip Generic drug: glucose blood 1 each by Other route 2 (two) times daily. Use as instructed   Accu-Chek Softclix Lancets lancets Use bid   amLODipine 5 MG tablet Commonly known as: NORVASC Take 5 mg by mouth 2 (two) times daily.   amoxicillin-clavulanate 875-125 MG tablet Commonly known as: Augmentin Take 1 tablet by mouth every 12 (twelve) hours for 11 days. What changed: additional instructions   atorvastatin 20 MG tablet Commonly known as: LIPITOR Take 1 tablet (20 mg total) by mouth daily.   insulin NPH Human 100 UNIT/ML injection Commonly known as: NovoLIN N ReliOn 8 U in am and 12 U hs What changed:  how much to take  how to take this  when to take this  additional instructions   insulin regular 100 units/mL injection Commonly known as: NovoLIN R ReliOn INJECT 8 UNITS SUBCUTANEOUSLY WITH BREAKFAST AND LUNCH, AND 16 UNITS WITH DINNER What changed:   how much to take  how to take this  when to take this  additional instructions   niacin 500 MG tablet Commonly known as: SLO-NIACIN Take 500 mg by mouth at bedtime.   omeprazole 40 MG capsule Commonly known as: PRILOSEC Take 40 mg by mouth daily.   ondansetron 4 MG tablet Commonly known as: ZOFRAN Take 1 tablet (4 mg total) by mouth every 6 (six) hours as needed for nausea.   oxyCODONE 5 MG immediate release tablet Commonly known as: Oxy IR/ROXICODONE Take 1-2 tablets (5-10 mg total) by mouth every 4 (four) hours as needed for moderate pain.   saccharomyces boulardii 250 MG capsule Commonly known as: Florastor Take 1 capsule (250 mg total) by mouth 2 (two) times daily for 20 days.   Vitamin D3 125 MCG  (5000 UT) Caps Take 5,000 Units by mouth daily.            Discharge Care Instructions  (From admission, onward)         Start     Ordered   07/15/20 0000  Discharge wound care:       Comments: Reinforce dressing as needed, follow-up wound care as per drain clinic IR instruction   07/15/20 0925          Follow-up Information    Lucianne Lei, MD Follow up in 1 week(s).   Specialty: Family Medicine Contact information: [address] [place] [address] [phone]        Carlyle Basques, MD Follow up in 1 week(s).   Specialty: Infectious Diseases Why: CALL NEED F/U IN 2 WKS Contact information: [facility] [place] [address] [phone]        Suzette Battiest, MD Follow up in 5 day(s).   Specialties: Interventional Radiology, Diagnostic Radiology, Radiology Why: CALL FOR DRAIN MANAGEMENT Contact information: [place] [address] [phone]              No Known Allergies  The results of significant diagnostics from this hospitalization (including imaging, microbiology, ancillary and laboratory) are listed below for reference.    Microbiology: Recent Results (from the past 240 hour(s))  Culture, blood (routine x 2)     Status: Abnormal (Preliminary result)   Collection Time: 07/06/20 12:33 AM   Specimen: BLOOD  Result Value Ref Range Status   Specimen Description BLOOD SITE NOT SPECIFIED  Final   Special Requests   Final    BOTTLES DRAWN AEROBIC AND ANAEROBIC Blood Culture adequate volume   Culture  Setup Time   Final    GRAM POSITIVE RODS AEROBIC BOTTLE ONLY CRITICAL RESULT CALLED TO, READ BACK BY AND VERIFIED WITH: PHARM D M.BITONTI ON [id number] AT [id number] BY E.PARRISH    Culture (A)  Final    BACILLUS SPECIES Standardized susceptibility testing for this organism is not available. ISOLATE REFERRED FOR ID ONLY Performed at [facility], [place] [address] [place], [place] [address]     Report Status PENDING  Incomplete  Resp Panel by RT-PCR (Flu A&B, Covid) Nasopharyngeal Swab     Status: None   Collection Time: 07/06/20 11:53 PM   Specimen: Nasopharyngeal Swab; Nasopharyngeal(NP) swabs  in vial transport medium  Result Value Ref Range Status   SARS Coronavirus 2 by RT PCR NEGATIVE NEGATIVE Final    Comment: (NOTE) SARS-CoV-2 target nucleic acids are NOT DETECTED.  The SARS-CoV-2 RNA is generally detectable in upper respiratory specimens during the acute phase of infection. The lowest concentration of SARS-CoV-2 viral copies this assay can detect is 138 copies/mL. A negative result does not preclude SARS-Cov-2 infection and should not be used as the sole basis for treatment or other patient management decisions. A negative result may occur with  improper specimen collection/handling, submission of specimen other than nasopharyngeal swab, presence of viral mutation(s) within the areas targeted by this assay, and inadequate number of viral copies(<138 copies/mL). A negative result must be combined with clinical observations, patient history, and epidemiological information. The expected result is Negative.  Fact Sheet for Patients:  EntrepreneurPulse.com.au  Fact Sheet for Healthcare Providers:  IncredibleEmployment.be  This test is no t yet approved or cleared by the [place] FDA and  has been authorized for detection and/or diagnosis of SARS-CoV-2 by FDA under an Emergency Use Authorization (EUA). This EUA will remain  in effect (meaning this test can be used) for the duration of the COVID-19 declaration under Section 564(b)(1) of the Act, 21 U.S.C.section 360bbb-3(b)(1), unless the authorization is terminated  or revoked sooner.       Influenza A by PCR NEGATIVE NEGATIVE Final   Influenza B by PCR NEGATIVE NEGATIVE Final    Comment: (NOTE) The Xpert Xpress SARS-CoV-2/FLU/RSV plus assay is intended as an aid in the  diagnosis of influenza from Nasopharyngeal swab specimens and should not be used as a sole basis for treatment. Nasal washings and aspirates are unacceptable for Xpert Xpress SARS-CoV-2/FLU/RSV testing.  Fact Sheet for Patients: EntrepreneurPulse.com.au  Fact Sheet for Healthcare Providers: IncredibleEmployment.be  This test is not yet approved or cleared by the [place] FDA and has been authorized for detection and/or diagnosis of SARS-CoV-2 by FDA under an Emergency Use Authorization (EUA). This EUA will remain in effect (meaning this test can be used) for the duration of the COVID-19 declaration under Section 564(b)(1) of the Act, 21 U.S.C. section 360bbb-3(b)(1), unless the authorization is terminated or revoked.  Performed at [facility], [place] [address], [place], [place] [address]   Culture, blood (routine x 2)     Status: None   Collection Time: 07/07/20  4:26 AM   Specimen: BLOOD RIGHT HAND  Result Value Ref Range Status   Specimen Description BLOOD RIGHT HAND  Final   Special Requests   Final    BOTTLES DRAWN AEROBIC AND ANAEROBIC Blood Culture adequate volume   Culture   Final    NO GROWTH 5 DAYS Performed at [facility], [place] [address], [place], [place] [address]    Report Status 07/12/2020 FINAL  Final  Aerobic/Anaerobic Culture (surgical/deep wound)     Status: None   Collection Time: 07/07/20  3:57 PM   Specimen: Abscess  Result Value Ref Range Status   Specimen Description ABSCESS  Final   Special Requests DRAIN  Final   Gram Stain   Final    FEW WBC PRESENT,BOTH PMN AND MONONUCLEAR NO ORGANISMS SEEN    Culture   Final    No growth aerobically or anaerobically. Performed at [facility], [place] [address], [place], [place] [address]    Report Status 07/12/2020 FINAL  Final  Culture, blood (Routine X 2) w Reflex to ID Panel  Status: None (Preliminary result)   Collection Time: 07/13/20  2:45 PM    Specimen: BLOOD LEFT HAND  Result Value Ref Range Status   Specimen Description BLOOD LEFT HAND  Final   Special Requests   Final    BOTTLES DRAWN AEROBIC AND ANAEROBIC Blood Culture adequate volume   Culture   Final    NO GROWTH 2 DAYS Performed at [facility], [address], [place], [place] [address]    Report Status PENDING  Incomplete  Culture, blood (Routine X 2) w Reflex to ID Panel     Status: None (Preliminary result)   Collection Time: 07/13/20  2:45 PM   Specimen: BLOOD LEFT HAND  Result Value Ref Range Status   Specimen Description BLOOD LEFT HAND  Final   Special Requests   Final    BOTTLES DRAWN AEROBIC AND ANAEROBIC Blood Culture adequate volume   Culture   Final    NO GROWTH 2 DAYS Performed at [facility], [place] [address] [place], [place] [address]    Report Status PENDING  Incomplete    Procedures/Studies: CT ABDOMEN PELVIS WO CONTRAST  Result Date: 07/14/2020 CLINICAL DATA:  80 year old female with history of cholecystectomy on 07/01/2020 with postoperative gallbladder fossa fluid collection status post percutaneous drainage on 07/07/2020. EXAM: CT ABDOMEN AND PELVIS WITHOUT CONTRAST TECHNIQUE: Multidetector CT imaging of the abdomen and pelvis was performed following the standard protocol without IV contrast. COMPARISON:  07/06/2020, 07/07/2020 FINDINGS: Lower chest: Resolution of previously visualized small right pleural effusion. There is trace residual right basilar subsegmental atelectasis. Hepatobiliary: Significant interval decreased size of previously visualized right subhepatic complex fluid collection which now measures approximately 4.2 x 2.3 x 2.4 cm (previously 7.6 x 6.7 x 7.2 cm) and contains foci of gas. The indwelling pigtail drain remains well position within the fluid collection from a mid axillary, percutaneous, transhepatic approach. The liver is otherwise normal in contour and attenuation. No evidence of focal mass, intra, or  extrahepatic biliary ductal dilation. Status post cholecystectomy. Pancreas: Unremarkable. No pancreatic ductal dilatation or surrounding inflammatory changes. Spleen: Normal in size without focal abnormality. Adrenals/Urinary Tract: Adrenal glands are unremarkable. Kidneys are normal, without renal calculi, focal lesion, or hydronephrosis. Bladder is unremarkable. Stomach/Bowel: Stomach is within normal limits. Similar appearing duodenal diverticulum arising from the third portion measuring up to approximately 3.7 cm. Appendix appears normal. No evidence of bowel wall thickening, distention, or inflammatory changes. Vascular/Lymphatic: Aortic atherosclerosis. No enlarged abdominal or pelvic lymph nodes. Reproductive: Calcified leiomyomatous uterus, unchanged. No adnexal masses. Other: Scattered injection site granulomas about the lower anterior abdomen. Tiny fat containing umbilical hernia. No ascites. Musculoskeletal: No acute or significant osseous findings. IMPRESSION: 1. Decreased size of previously visualized subhepatic abscess with unchanged position of indwelling transhepatic pigtail drain. 2. Resolution of previously visualized right pleural effusion. There is persistent right basilar subsegmental atelectasis. PLAN: Keep drain in place for now and continue routine flushes. IR will continue to follow. Ruthann Cancer, MD [facility] Electronically Signed   By: Ruthann Cancer MD   On: 07/14/2020 11:48   CT ABDOMEN PELVIS W CONTRAST  Result Date: 07/06/2020 CLINICAL DATA:  Nonlocalized acute abdominal pain. EXAM: CT ABDOMEN AND PELVIS WITH CONTRAST TECHNIQUE: Multidetector CT imaging of the abdomen and pelvis was performed using the standard protocol following bolus administration of intravenous contrast. CONTRAST:  64m OMNIPAQUE IOHEXOL 300 MG/ML  SOLN COMPARISON:  CT abdomen pelvis 06/30/2020 FINDINGS: Lower chest: Trace right pleural  effusion.  Right lower lobe passive atelectasis. Coronary artery calcifications. Hepatobiliary: No focal liver abnormality is seen. Status post cholecystectomy with interval development of a 7.6 x 6.7 x 7.2 cm gallbladder fossa fluid and gas collection (5:34). Slight peripheral enhancement noted. No biliary dilatation. Pancreas: No focal lesion. Normal pancreatic contour. No surrounding inflammatory changes. No main pancreatic ductal dilatation. Spleen: Normal in size without focal abnormality. Adrenals/Urinary Tract: No adrenal nodule bilaterally. Bilateral kidneys enhance symmetrically. No hydronephrosis. No hydroureter. The urinary bladder is unremarkable. Stomach/Bowel: Stomach is within normal limits. No evidence of bowel wall thickening or dilatation. Redemonstration of a third portion of the duodenum diverticulum (3:35). Fluid density within the lumen of the large bowel. Appendix appears normal. Vascular/Lymphatic: No abdominal aorta or iliac aneurysm. Mild-to-moderate atherosclerotic plaque of the aorta and its branches. No abdominal, pelvic, or inguinal lymphadenopathy. Reproductive: Multiple coarsely calcified lesions within the uterus likely represents degenerative uterine fibroids. Other: Trace peripancreatic free fluid. No intraperitoneal free gas. Musculoskeletal: No abdominal wall hernia or abnormality. No suspicious lytic or blastic osseous lesions. No acute displaced fracture. Multilevel degenerative changes of the spine. IMPRESSION: 1. Status post cholecystectomy with interval development of a 7.6 x 6.7 cm gallbladder fossa abscess . 2. Trace right pleural effusion. 3. Multiple degenerative uterine fibroids. 4. Fast-incision state. Electronically Signed   By: Iven Finn M.D.   On: 07/06/2020 22:18   CT Abdomen Pelvis W Contrast  Result Date: 06/30/2020 CLINICAL DATA:  Acute abdominal pain EXAM: CT ABDOMEN AND PELVIS WITH CONTRAST TECHNIQUE: Multidetector CT imaging of the abdomen and pelvis was  performed using the standard protocol following bolus administration of intravenous contrast. CONTRAST:  175m OMNIPAQUE IOHEXOL 300 MG/ML  SOLN COMPARISON:  04/09/2012 FINDINGS: LOWER CHEST: Normal. HEPATOBILIARY: Distended gallbladder with cholelithiasis. Mild intrahepatic biliary dilatation . no focal liver abnormality. Common bile duct is normal caliber. PANCREAS: Normal pancreas. No ductal dilatation or peripancreatic fluid collection. SPLEEN: Normal. ADRENALS/URINARY TRACT: The adrenal glands are normal. No hydronephrosis, nephroureterolithiasis or solid renal mass. The urinary bladder is normal for degree of distention STOMACH/BOWEL: No hiatal hernia. There is a diverticulum of the third segment of the duodenum. No small bowel dilatation or inflammation. No focal colonic abnormality. Normal appendix. VASCULAR/LYMPHATIC: There is calcific atherosclerosis of the abdominal aorta. No lymphadenopathy. REPRODUCTIVE: Multiple calcified uterine fibroids. MUSCULOSKELETAL. Multilevel degenerative disc disease and facet arthrosis. No bony spinal canal stenosis. OTHER: None. IMPRESSION: 1. Distended gallbladder with cholelithiasis and mild intrahepatic biliary dilatation. If there is concern for acute cholecystitis, consider right upper quadrant ultrasound. 2. Fibroid uterus. Aortic atherosclerosis (ICD10-I70.0). Electronically Signed   By: KUlyses JarredM.D.   On: 06/30/2020 03:21   CT L-SPINE NO CHARGE  Result Date: 06/30/2020 CLINICAL DATA:  Right flank and back pain after fall EXAM: CT LUMBAR SPINE WITHOUT CONTRAST TECHNIQUE: Multidetector CT imaging of the lumbar spine was performed without intravenous contrast administration. Multiplanar CT image reconstructions were also generated. COMPARISON:  None. FINDINGS: Segmentation: 5 lumbar type vertebrae. Alignment: Normal. Vertebrae: No acute fracture or focal pathologic process. Paraspinal and other soft tissues: Calcific aortic atherosclerosis. Uterine fibroids.  Disc levels: There is mild spinal canal stenosis at L4-5. There is severe bilateral L5-S1 neural foraminal stenosis. IMPRESSION: 1. No acute fracture or static subluxation of the lumbar spine. 2. Severe bilateral L5-S1 neural foraminal stenosis. Aortic Atherosclerosis (ICD10-I70.0). Electronically Signed   By: KUlyses JarredM.D.   On: 06/30/2020 02:57   ECHOCARDIOGRAM COMPLETE  Result Date: 07/11/2020    ECHOCARDIOGRAM REPORT  Patient Name:   Sandra Washington Date of Exam: 07/11/2020 Medical Rec #:  [id number]              Height:       66.0 in Accession #:    [id number]             Weight:       187.2 lb Date of Birth:  02/28/1940             BSA:          1.944 m Patient Age:    26 years               BP:           131/51 mmHg Patient Gender: F                      HR:           69 bpm. Exam Location:  Inpatient Procedure: 2D Echo, Cardiac Doppler and Color Doppler Indications:    Bacteremia R 78.81  History:        Patient has no prior history of Echocardiogram examinations.                 Gallbladder surgery.  Sonographer:    Merrie Roof RDCS Referring Phys: H. Rivera Colon  1. Left ventricular ejection fraction, by estimation, is 60 to 65%. The left ventricle has normal function. The left ventricle has no regional wall motion abnormalities. There is mild left ventricular hypertrophy. Left ventricular diastolic parameters are consistent with Grade I diastolic dysfunction (impaired relaxation).  2. Right ventricular systolic function is normal. The right ventricular size is normal.  3. The mitral valve is abnormal. Significant thickening of anterior leaflet. Trivial mitral valve regurgitation. No evidence of mitral stenosis.  4. The aortic valve is tricuspid. Aortic valve regurgitation is not visualized. Mild aortic valve stenosis. Vmax 2.2 m/s, MG 65mHg, AVA 1.5 cm^2, DI 0.48. Conclusion(s)/Recommendation(s): No clear vegetation seen, but significant thickening of anterior mitral  valve leaflet. Trivial MR. If clinical suspicion for endocarditis, recommend TEE. FINDINGS  Left Ventricle: Left ventricular ejection fraction, by estimation, is 60 to 65%. The left ventricle has normal function. The left ventricle has no regional wall motion abnormalities. The left ventricular internal cavity size was normal in size. There is  mild left ventricular hypertrophy. Left ventricular diastolic parameters are consistent with Grade I diastolic dysfunction (impaired relaxation). Right Ventricle: The right ventricular size is normal. No increase in right ventricular wall thickness. Right ventricular systolic function is normal. Left Atrium: Left atrial size was normal in size. Right Atrium: Right atrial size was normal in size. Pericardium: There is no evidence of pericardial effusion. Mitral Valve: The mitral valve is abnormal. There is moderate thickening of the mitral valve leaflet(s). Trivial mitral valve regurgitation. No evidence of mitral valve stenosis. Tricuspid Valve: The tricuspid valve is normal in structure. Tricuspid valve regurgitation is trivial. Aortic Valve: The aortic valve is tricuspid. Aortic valve regurgitation is not visualized. Mild aortic stenosis is present. Aortic valve mean gradient measures 9.0 mmHg. Aortic valve peak gradient measures 18.7 mmHg. Aortic valve area, by VTI measures 1.50 cm. Pulmonic Valve: The pulmonic valve was not well visualized. Pulmonic valve regurgitation is not visualized. Aorta: The aortic root and ascending aorta are structurally normal, with no evidence of dilitation. IAS/Shunts: The interatrial septum was not well visualized.  LEFT VENTRICLE PLAX 2D LVIDd:  4.50 cm  Diastology LVIDs:         2.90 cm  LV e' medial:    7.51 cm/s LV PW:         1.00 cm  LV E/e' medial:  9.6 LV IVS:        0.80 cm  LV e' lateral:   8.81 cm/s LVOT diam:     2.00 cm  LV E/e' lateral: 8.2 LV SV:         69 LV SV Index:   36 LVOT Area:     3.14 cm  RIGHT VENTRICLE RV  Basal diam:  3.20 cm RV S prime:     13.20 cm/s TAPSE (M-mode): 2.2 cm LEFT ATRIUM             Index       RIGHT ATRIUM           Index LA diam:        4.20 cm 2.16 cm/m  RA Area:     10.90 cm LA Vol (A2C):   67.6 ml 34.77 ml/m RA Volume:   21.70 ml  11.16 ml/m LA Vol (A4C):   42.6 ml 21.91 ml/m LA Biplane Vol: 55.7 ml 28.65 ml/m  AORTIC VALVE AV Area (Vmax):    1.47 cm AV Area (Vmean):   1.51 cm AV Area (VTI):     1.50 cm AV Vmax:           216.00 cm/s AV Vmean:          140.000 cm/s AV VTI:            0.460 m AV Peak Grad:      18.7 mmHg AV Mean Grad:      9.0 mmHg LVOT Vmax:         101.00 cm/s LVOT Vmean:        67.100 cm/s LVOT VTI:          0.220 m LVOT/AV VTI ratio: 0.48  AORTA Ao Root diam: 2.80 cm Ao Asc diam:  2.70 cm MITRAL VALVE                TRICUSPID VALVE MV Area (PHT): 3.89 cm     TR Peak grad:   23.6 mmHg MV Decel Time: 195 msec     TR Vmax:        243.00 cm/s MV E velocity: 72.20 cm/s MV A velocity: 109.00 cm/s  SHUNTS MV E/A ratio:  0.66         Systemic VTI:  0.22 m                             Systemic Diam: 2.00 cm Oswaldo Milian MD Electronically signed by Oswaldo Milian MD Signature Date/Time: 07/11/2020/5:40:13 PM    Final    CT IMAGE GUIDED FLUID DRAIN BY CATHETER  Result Date: 07/07/2020 INDICATION: History of cholecystectomy performed on 05/05 by Dr. Constance Haw, now with fluid collection within the gallbladder fossa worrisome for abscess and/or bile leak. Please perform image guided drainage catheter placement for infection source control purposes. EXAM: ULTRASOUND AND CT GUIDED DRAINAGE CATHETER PLACEMENT INTO GALLBLADDER FOSSA FLUID COLLECTION COMPARISON:  CT abdomen pelvis-07/06/2020; nuclear medicine HIDA scan-earlier same day MEDICATIONS: The patient is currently admitted to the hospital and receiving intravenous antibiotics. The antibiotics were administered within an appropriate time frame prior to the initiation of the procedure. ANESTHESIA/SEDATION: Moderate  (conscious) sedation was employed during this  procedure. A total of Versed 3 mg and Fentanyl 100 mcg was administered intravenously. Moderate Sedation Time: 20 minutes. The patient's level of consciousness and vital signs were monitored continuously by radiology nursing throughout the procedure under my direct supervision. CONTRAST:  None COMPLICATIONS: None immediate. PROCEDURE: Informed written consent was obtained from the patient after a discussion of the risks, benefits and alternatives to treatment. The patient was placed supine on the CT gantry and a pre procedural CT was performed re-demonstrating the known abscess/fluid collection within the gallbladder fossa with dominant component measuring approximately 8.0 x 6.5 cm (image 38, series 2). The table position was marked and the complex mixed echogenic fluid collection was identified sonographically. The procedure was planned. A timeout was performed prior to the initiation of the procedure. The skin overlying the right upper abdominal quadrant was prepped and draped in the usual sterile fashion. After the overlying soft tissues were anesthetized with 1% lidocaine with epinephrine, an 18 gauge trocar needle was utilized to access the indeterminate fluid collection in the gallbladder fossa under direct ultrasound guidance using a transhepatic approach. Multiple ultrasound images were saved procedural documentation purposes. Appropriate positioning was confirmed with CT imaging. Next, the track was dilated allowing placement of a 12 French percutaneous catheter with end coiled and locked within the medial aspect of the indeterminate fluid collection. Next, approximately 175 cc of bilious appearing fluid was aspirated and postprocedural imaging was obtained The drainage catheter was connected to a gravity bag and secured in place within interrupted suture and a Stat Lock device. A dressing was applied. The patient tolerated the procedure well without immediate  postprocedural complication. IMPRESSION: Successful ultrasound and CT guided placement of a 12 French all purpose drain catheter into the gallbladder fossa fluid collection with aspiration of 175 cc of bilious appearing fluid. Samples were sent to the laboratory as requested by the ordering clinical team. Electronically Signed   By: Sandi Mariscal M.D.   On: 07/07/2020 16:33   NM HEPATOBILIARY LEAK (POST-SURGICAL)  Result Date: 07/07/2020 CLINICAL DATA:  Evaluate for bile leak status post cholecystectomy. EXAM: NUCLEAR MEDICINE HEPATOBILIARY IMAGING TECHNIQUE: Sequential images of the abdomen were obtained out to 60 minutes following intravenous administration of radiopharmaceutical. RADIOPHARMACEUTICALS:  5.4 mCi Tc-63m Choletec IV COMPARISON:  CT AP 07/06/2020 FINDINGS: Prompt uptake and biliary excretion of activity by the liver is seen. Biliary activity passes into small bowel, consistent with patent common bile duct. No abnormal radiotracer accumulation identified to suggest bile leak. IMPRESSION: 1. No signs of bile leak. 2. Patent common bile duct with normal biliary to bowel transit. Electronically Signed   By: TKerby MoorsM.D.   On: 07/07/2020 13:22   UKoreaAbdomen Limited RUQ (LIVER/GB)  Result Date: 06/30/2020 CLINICAL DATA:  Upper abdominal pain EXAM: ULTRASOUND ABDOMEN LIMITED RIGHT UPPER QUADRANT COMPARISON:  CT abdomen and pelvis Jun 30, 2020 FINDINGS: Gallbladder: Within the gallbladder, there are echogenic foci which move and shadow consistent with cholelithiasis. Largest gallstone measures 9 mm in length. There is gallbladder wall thickening. There is also sludge in the gallbladder. No pericholecystic fluid. Patient is focally tender over the gallbladder. Common bile duct: Diameter: 2 mm. No evident intrahepatic or extrahepatic biliary duct dilatation. Liver: No focal lesion identified. Liver echogenicity overall increased. Portal vein is patent on color Doppler imaging with normal direction of  blood flow towards the liver. Other: None. IMPRESSION: 1. Cholelithiasis and sludge in gallbladder with gallbladder wall thickening and focal tenderness over the gallbladder. These are findings  raising concern for acute cholecystitis. 2. Increased liver echogenicity, a finding likely indicative of hepatic steatosis. No focal liver lesions evident. Note that sensitivity of ultrasound for detection of focal liver lesions is diminished in this circumstance. Electronically Signed   By: Lowella Grip III M.D.   On: 06/30/2020 08:17    Labs: BNP (last 3 results) No results for input(s): BNP in the last 8760 hours. Basic Metabolic Panel: Recent Labs  Lab 07/13/20 0430 07/13/20 0933 07/14/20 0303 07/15/20 0425  NA  --  139 137 137  K  --  4.5 4.2 4.1  CL  --  107 108 108  CO2  --  '27 24 25  ' GLUCOSE  --  174* 232* 190*  BUN  --  5* 7* 10  CREATININE 0.78 0.85 0.73 0.82  CALCIUM  --  9.4 9.5 9.2   Liver Function Tests: Recent Labs  Lab 07/13/20 0933  AST 18  ALT 13  ALKPHOS 61  BILITOT 0.7  PROT 6.4*  ALBUMIN 2.9*   No results for input(s): LIPASE, AMYLASE in the last 168 hours. No results for input(s): AMMONIA in the last 168 hours. CBC: Recent Labs  Lab 07/13/20 0933 07/14/20 0303 07/15/20 0425  WBC 7.1 7.0 6.8  HGB 11.5* 11.1* 10.9*  HCT 37.3 35.3* 35.0*  MCV 88.0 86.7 86.2  PLT 476* 442* 423*   Cardiac Enzymes: No results for input(s): CKTOTAL, CKMB, CKMBINDEX, TROPONINI in the last 168 hours. BNP: Invalid input(s): POCBNP CBG: Recent Labs  Lab 07/14/20 0807 07/14/20 1125 07/14/20 1605 07/14/20 2021 07/15/20 0645  GLUCAP 166* 197* 134* 237* 164*   D-Dimer No results for input(s): DDIMER in the last 72 hours. Hgb A1c No results for input(s): HGBA1C in the last 72 hours. Lipid Profile No results for input(s): CHOL, HDL, LDLCALC, TRIG, CHOLHDL, LDLDIRECT in the last 72 hours. Thyroid function studies No results for input(s): TSH, T4TOTAL, T3FREE,  THYROIDAB in the last 72 hours.  Invalid input(s): FREET3 Anemia work up No results for input(s): VITAMINB12, FOLATE, FERRITIN, TIBC, IRON, RETICCTPCT in the last 72 hours. Urinalysis    Component Value Date/Time   COLORURINE COLORLESS (A) 06/30/2020 0340   APPEARANCEUR CLEAR 06/30/2020 0340   LABSPEC 1.023 06/30/2020 0340   PHURINE 7.0 06/30/2020 0340   GLUCOSEU >=500 (A) 06/30/2020 0340   GLUCOSEU NEGATIVE 10/19/2015 0834   HGBUR SMALL (A) 06/30/2020 0340   BILIRUBINUR NEGATIVE 06/30/2020 0340   KETONESUR 20 (A) 06/30/2020 0340   PROTEINUR 30 (A) 06/30/2020 0340   UROBILINOGEN 0.2 10/19/2015 0834   NITRITE NEGATIVE 06/30/2020 0340   LEUKOCYTESUR NEGATIVE 06/30/2020 0340   Sepsis Labs Invalid input(s): PROCALCITONIN,  WBC,  LACTICIDVEN Microbiology Recent Results (from the past 240 hour(s))  Culture, blood (routine x 2)     Status: Abnormal (Preliminary result)   Collection Time: 07/06/20 12:33 AM   Specimen: BLOOD  Result Value Ref Range Status   Specimen Description BLOOD SITE NOT SPECIFIED  Final   Special Requests   Final    BOTTLES DRAWN AEROBIC AND ANAEROBIC Blood Culture adequate volume   Culture  Setup Time   Final    GRAM POSITIVE RODS AEROBIC BOTTLE ONLY CRITICAL RESULT CALLED TO, READ BACK BY AND VERIFIED WITH: PHARM D M.BITONTI ON [id number] AT 1324 BY E.PARRISH    Culture (A)  Final    BACILLUS SPECIES Standardized susceptibility testing for this organism is not available. ISOLATE REFERRED FOR ID ONLY Performed at [facility], [place]  [address] [place], [place] [address]    Report Status PENDING  Incomplete  Resp Panel by RT-PCR (Flu A&B, Covid) Nasopharyngeal Swab     Status: None   Collection Time: 07/06/20 11:53 PM   Specimen: Nasopharyngeal Swab; Nasopharyngeal(NP) swabs in vial transport medium  Result Value Ref Range Status   SARS Coronavirus 2 by RT PCR NEGATIVE NEGATIVE Final    Comment: (NOTE) SARS-CoV-2 target nucleic acids are NOT  DETECTED.  The SARS-CoV-2 RNA is generally detectable in upper respiratory specimens during the acute phase of infection. The lowest concentration of SARS-CoV-2 viral copies this assay can detect is 138 copies/mL. A negative result does not preclude SARS-Cov-2 infection and should not be used as the sole basis for treatment or other patient management decisions. A negative result may occur with  improper specimen collection/handling, submission of specimen other than nasopharyngeal swab, presence of viral mutation(s) within the areas targeted by this assay, and inadequate number of viral copies(<138 copies/mL). A negative result must be combined with clinical observations, patient history, and epidemiological information. The expected result is Negative.  Fact Sheet for Patients:  EntrepreneurPulse.com.au  Fact Sheet for Healthcare Providers:  IncredibleEmployment.be  This test is no t yet approved or cleared by the [place] FDA and  has been authorized for detection and/or diagnosis of SARS-CoV-2 by FDA under an Emergency Use Authorization (EUA). This EUA will remain  in effect (meaning this test can be used) for the duration of the COVID-19 declaration under Section 564(b)(1) of the Act, 21 U.S.C.section 360bbb-3(b)(1), unless the authorization is terminated  or revoked sooner.       Influenza A by PCR NEGATIVE NEGATIVE Final   Influenza B by PCR NEGATIVE NEGATIVE Final    Comment: (NOTE) The Xpert Xpress SARS-CoV-2/FLU/RSV plus assay is intended as an aid in the diagnosis of influenza from Nasopharyngeal swab specimens and should not be used as a sole basis for treatment. Nasal washings and aspirates are unacceptable for Xpert Xpress SARS-CoV-2/FLU/RSV testing.  Fact Sheet for Patients: EntrepreneurPulse.com.au  Fact Sheet for Healthcare Providers: IncredibleEmployment.be  This test is not yet  approved or cleared by the [place] FDA and has been authorized for detection and/or diagnosis of SARS-CoV-2 by FDA under an Emergency Use Authorization (EUA). This EUA will remain in effect (meaning this test can be used) for the duration of the COVID-19 declaration under Section 564(b)(1) of the Act, 21 U.S.C. section 360bbb-3(b)(1), unless the authorization is terminated or revoked.  Performed at [facility], [place] [address], [place], [place] [address]   Culture, blood (routine x 2)     Status: None   Collection Time: 07/07/20  4:26 AM   Specimen: BLOOD RIGHT HAND  Result Value Ref Range Status   Specimen Description BLOOD RIGHT HAND  Final   Special Requests   Final    BOTTLES DRAWN AEROBIC AND ANAEROBIC Blood Culture adequate volume   Culture   Final    NO GROWTH 5 DAYS Performed at [facility], [place] [address], [place],  [address]    Report Status 07/12/2020 FINAL  Final  Aerobic/Anaerobic Culture (surgical/deep wound)     Status: None   Collection Time: 07/07/20  3:57 PM   Specimen: Abscess  Result Value Ref Range Status   Specimen Description ABSCESS  Final   Special Requests DRAIN  Final   Gram Stain   Final    FEW WBC PRESENT,BOTH PMN AND MONONUCLEAR NO ORGANISMS SEEN    Culture  Final    No growth aerobically or anaerobically. Performed at [facility], [place] [address] [place], [place] [address]    Report Status 07/12/2020 FINAL  Final  Culture, blood (Routine X 2) w Reflex to ID Panel     Status: None (Preliminary result)   Collection Time: 07/13/20  2:45 PM   Specimen: BLOOD LEFT HAND  Result Value Ref Range Status   Specimen Description BLOOD LEFT HAND  Final   Special Requests   Final    BOTTLES DRAWN AEROBIC AND ANAEROBIC Blood Culture adequate volume   Culture   Final    NO GROWTH 2 DAYS Performed at [facility], [place] [address], [place], [place] [address]    Report Status PENDING  Incomplete  Culture, blood  (Routine X 2) w Reflex to ID Panel     Status: None (Preliminary result)   Collection Time: 07/13/20  2:45 PM   Specimen: BLOOD LEFT HAND  Result Value Ref Range Status   Specimen Description BLOOD LEFT HAND  Final   Special Requests   Final    BOTTLES DRAWN AEROBIC AND ANAEROBIC Blood Culture adequate volume   Culture   Final    NO GROWTH 2 DAYS Performed at [facility], [place] [address], [place], [place] [address]    Report Status PENDING  Incomplete     Time coordinating discharge: 35 minutes  SIGNED: Antonieta Pert, MD  [facility] 07/15/2020, 11:11 AM  If 7PM-7AM, please contact night-coverage [email or web address]

## 2020-07-15 NOTE — Progress Notes (Addendum)
Patient is set to discharge with transportation provided by family member to home.  Patient received AVS including education regarding follow up appointments and changes to medications with patient verbalizing understanding of information and no questions or concerns at this time.  Patient received all DME equipment including rolling walker with seat and bedside commode.  PIV removed prior to discharge.  Nursing staff to transport patient via wheelchair off the unit.

## 2020-07-15 NOTE — Progress Notes (Signed)
Physical Therapy Treatment Patient Details Name: Sandra Washington MRN: [id number] DOB: 1941-01-17 Today's Date: 07/15/2020    History of Present Illness Pt is an 80 yo female presenting 5/10 from PCP due to continued abdominal pain, nausea, vomiting, and weakness after gallbladder removal on 5/5.  Upon workup, pt found to have gallbladder fossa abcess, started on antibiotics. s/p drain placement 5/11. PMH includes: CKD, DM II, HTN, and stroke.    PT Comments    Pt seated edge of bed.  Performed gt training without device with noticeable fatigue.  Update DME recs for rollator to assist with energy conservation.  Pt tolerated session well and ready to d/c home.    Follow Up Recommendations  Home health PT;Supervision for mobility/OOB     Equipment Recommendations  3in1 (PT);Other (comment) (4 wheeled rolling walker with a seat aka rollator.)    Recommendations for Other Services       Precautions / Restrictions Precautions Precautions: Fall Precaution Comments: x1 fall, percutaneous drain right side Restrictions Weight Bearing Restrictions: No    Mobility  Bed Mobility               General bed mobility comments: seated edge of bed on arrival.    Transfers Overall transfer level: Needs assistance Equipment used: None   Sit to Stand: Supervision         General transfer comment: for safety as this was attempted without device.  Ambulation/Gait Ambulation/Gait assistance: Min guard Gait Distance (Feet): 200 Feet Assistive device: None Gait Pattern/deviations: Decreased stride length;Trunk flexed;Step-through pattern Gait velocity: slowed   General Gait Details: No device this session with mild unsteadiness and noticeably fatigued.   Stairs Stairs: Yes Stairs assistance: Min assist Stair Management: No rails (with hand held assistance.) Number of Stairs: 4 General stair comments: HHA for support.   Wheelchair Mobility    Modified Rankin  (Stroke Patients Only)       Balance Overall balance assessment: Needs assistance Sitting-balance support: No upper extremity supported;Feet supported Sitting balance-Leahy Scale: Good       Standing balance-Leahy Scale: Good                              Cognition Arousal/Alertness: Awake/alert Behavior During Therapy: WFL for tasks assessed/performed Overall Cognitive Status: Within Functional Limits for tasks assessed                                        Exercises      General Comments        Pertinent Vitals/Pain Pain Assessment: Faces Faces Pain Scale: Hurts a little bit Pain Location: R drain site Pain Descriptors / Indicators: Discomfort Pain Intervention(s): Monitored during session;Repositioned    Home Living                      Prior Function            PT Goals (current goals can now be found in the care plan section) Acute Rehab PT Goals Patient Stated Goal: to go home Potential to Achieve Goals: Good Progress towards PT goals: Progressing toward goals    Frequency    Min 3X/week      PT Plan Current plan remains appropriate    Co-evaluation              AM-PAC PT "  6 Clicks" Mobility   Outcome Measure  Help needed turning from your back to your side while in a flat bed without using bedrails?: None Help needed moving from lying on your back to sitting on the side of a flat bed without using bedrails?: None Help needed moving to and from a bed to a chair (including a wheelchair)?: A Little Help needed standing up from a chair using your arms (e.g., wheelchair or bedside chair)?: A Little Help needed to walk in hospital room?: A Little Help needed climbing 3-5 steps with a railing? : A Little 6 Click Score: 20    End of Session Equipment Utilized During Treatment: Gait belt Activity Tolerance: Patient tolerated treatment well Patient left: in chair;with call bell/phone within reach Nurse  Communication: Mobility status PT Visit Diagnosis: Other abnormalities of gait and mobility (R26.89);Muscle weakness (generalized) (M62.81);Pain Pain - Right/Left: Right Pain - part of body:  (abdomen)     Time: 8341-9622 PT Time Calculation (min) (ACUTE ONLY): 30 min  Charges:  $Gait Training: 8-22 mins $Therapeutic Activity: 8-22 mins                     Erasmo Leventhal , PTA Acute Rehabilitation Services Pager [phone] Office [phone]     Asmar Brozek Eli Hose 07/15/2020, 2:59 PM

## 2020-07-16 ENCOUNTER — Other Ambulatory Visit: Payer: Self-pay

## 2020-07-16 NOTE — Patient Instructions (Addendum)
Goals Addressed            This Visit's Progress   . Careful Skin Care- Watch surgical site for infection.       Barriers: Knowledge Timeframe:  Short-Term Goal Priority:  High Start Date:    07/16/20                         Expected End Date:      08/26/20                 Follow Up Date 07/27/20   - clean and dry skin well  Monitor for signs of infection: redness, swelling, or increased pain to site.    Why is this important?     Taking really good care of your skin will help to keep your skin unbroken.    Notes: 07/16/20 Notify physician for any changes.     . Make and Keep All Appointments       Barriers: None Timeframe:  Long-Range Goal Priority:  High Start Date:    07/16/20                         Expected End Date:  10/27/20                    Follow Up Date 08/26/20   - ask family or friend for a ride - keep a calendar with appointment dates    Why is this important?    Part of staying healthy is seeing the doctor for follow-up care.   If you forget your appointments, there are some things you can do to stay on track.    Notes: 07/16/20 No problems with transportation    . Monitor and Manage My Blood Sugar-Diabetes Type 2       Barriers: Knowledge Timeframe:  Long-Range Goal Priority:  High Start Date:     07/16/20                        Expected End Date:     10/27/20                  Follow Up Date 08/26/20    - check blood sugar at prescribed times - take the blood sugar log to all doctor visits    Why is this important?    Checking your blood sugar at home helps to keep it from getting very high or very low.   Writing the results in a diary or log helps the doctor know how to care for you.   Your blood sugar log should have the time, date and the results.   Also, write down the amount of insulin or other medicine that you take.   Other information, like what you ate, exercise done and how you were feeling, will also be helpful.     Notes:  07/16/20 Continue to monitor sugars and limit carbohydrates.       Wound Infection A wound infection happens when tiny organisms (microorganisms) start to grow in a wound. A wound infection is most often caused by bacteria. Infection can cause the wound to break open or worsen. Wound infection needs treatment. If a wound infection is left untreated, complications can occur. Untreated wound infections may lead to an infection in the bloodstream (septicemia) or a bone infection (osteomyelitis). What are the causes? This condition is most often  caused by bacteria growing in a wound. Other microorganisms, like yeast and fungi, can also cause wound infections. What increases the risk? The following factors may make you more likely to develop this condition:  Having a weak body defense system (immune system).  Having diabetes.  Taking steroid medicines for a long time (chronic use).  Smoking.  Being an older person.  Being overweight.  Taking chemotherapy medicines. What are the signs or symptoms? Symptoms of this condition include:  Having more redness, swelling, or pain at the wound site.  Having more blood or fluid at the wound site.  A bad smell coming from a wound or bandage (dressing).  Having a fever.  Feeling tired or fatigued.  Having warmth at or around the wound.  Having pus at the wound site. How is this diagnosed? This condition is diagnosed with a medical history and physical exam. You may also have a wound culture or blood tests or both. How is this treated? This condition is usually treated with an antibiotic medicine.  The infection should improve 24-48 hours after you start antibiotics.  After 24-48 hours, redness around the wound should stop spreading, and the wound should be less painful. Follow these instructions at home: Medicines  Take or apply over-the-counter and prescription medicines only as told by your health care provider.  If you were  prescribed an antibiotic medicine, take or apply it as told by your health care provider. Do not stop using the antibiotic even if you start to feel better. Wound care  Clean the wound each day, or as told by your health care provider. ? Wash the wound with mild soap and water. ? Rinse the wound with water to remove all soap. ? Pat the wound dry with a clean towel. Do not rub it.  Follow instructions from your health care provider about how to take care of your wound. Make sure you: ? Wash your hands with soap and water before and after you change your dressing. If soap and water are not available, use hand sanitizer. ? Change your dressing as told by your health care provider. ? Leave stitches (sutures), skin glue, or adhesive strips in place if your wound has been closed. These skin closures may need to stay in place for 2 weeks or longer. If adhesive strip edges start to loosen and curl up, you may trim the loose edges. Do not remove adhesive strips completely unless your health care provider tells you to do that. Some wounds are left open to heal on their own.  Check your wound every day for signs of infection. Watch for: ? More redness, swelling, or pain. ? More fluid or blood. ? Warmth. ? Pus or a bad smell.   General instructions  Keep the dressing dry until your health care provider says it can be removed.  Do not take baths, swim, or use a hot tub until your health care provider approves. Ask your health care provider if you may take showers. You may only be allowed to take sponge baths.  Raise (elevate) the injured area above the level of your heart while you are sitting or lying down.  Do not scratch or pick at the wound.  Keep all follow-up visits as told by your health care provider. This is important. Contact a health care provider if:  Your pain is not controlled with medicine.  You have more redness, swelling, or pain around your wound.  You have more fluid or blood  coming  from your wound.  Your wound feels warm to the touch.  You have pus coming from your wound.  You continue to notice a bad smell coming from your wound or your dressing.  Your wound that was closed breaks open. Get help right away if:  You have a red streak going away from your wound.  You have a fever. Summary  A wound infection happens when tiny organisms (microorganisms) start to grow in a wound.  This condition is usually treated with an antibiotic medicine.  Follow instructions from your health care provider about how to take care of your wound.  Contact a health care provider if your wound infection does not begin to improve in 24-48 hours, or your symptoms worsen.  Keep all follow-up visits as told by your health care provider. This is important. This information is not intended to replace advice given to you by your health care provider. Make sure you discuss any questions you have with your health care provider. Document Revised: 09/25/2017 Document Reviewed: 09/25/2017 Elsevier Patient Education  [place].

## 2020-07-16 NOTE — Patient Outreach (Signed)
[facility]) Care Management  [place]  07/16/2020   Sandra Washington [birth year] [id number]  Subjective: Telephone call to patient for post hospital follow up. Patient states she feels much better.  She is in  [place] at her son's home for now.  She has home health ordered through [facility] per patient. She is waiting for them to call.  She has percutaneous drain which she knows how to empty. Discussed signs of infection. She verbalized understanding.   She is independent with her care and has all her medications.  She has follow up appointments scheduled and no transportation problems.    Patient states her diabetes control is good with her blood sugar earlier was 117.  Discussed diabetes control.    Medical history significant for essential hypertension, type 2 diabetes, hyperlipidemia.  Recent admission for acute gangrenous cholecystitis status postcholecystectomy on 07/01/2020 by Dr. Constance Haw.  She presented back to hospital with nausea, vomiting and diarrhea and was found to have a gallbladder abscess.    Discussed [facility] services and support. Patient is agreeable to services.     Objective:   Encounter Medications:  Outpatient Encounter Medications as of 07/16/2020  Medication Sig  . ACCU-CHEK SOFTCLIX LANCETS lancets Use bid  . amLODipine (NORVASC) 5 MG tablet Take 5 mg by mouth 2 (two) times daily.  Marland Kitchen amoxicillin-clavulanate (AUGMENTIN) 875-125 MG tablet Take 1 tablet by mouth every 12 (twelve) hours for 11 days.  Marland Kitchen atorvastatin (LIPITOR) 20 MG tablet Take 1 tablet (20 mg total) by mouth daily.  . Blood Glucose Monitoring Suppl (ACCU-CHEK GUIDE ME) w/Device KIT 1 each by Does not apply route 2 (two) times daily. Use accu chek guide me device to check blood sugar twice daily.DX:E11.65  . Cholecalciferol (VITAMIN D3) 5000 UNITS CAPS Take 5,000 Units by mouth daily.  Marland Kitchen glucose blood (ACCU-CHEK GUIDE) test strip 1 each by Other route 2 (two) times  daily. Use as instructed  . insulin NPH Human (NOVOLIN N RELION) 100 UNIT/ML injection 8 U in am and 12 U hs (Patient taking differently: Inject 8-12 Units into the skin See admin instructions. Takes 8 units in the morning and 12 units at night)  . insulin regular (NOVOLIN R RELION) 100 units/mL injection INJECT 8 UNITS SUBCUTANEOUSLY WITH BREAKFAST AND LUNCH, AND 16 UNITS WITH DINNER (Patient taking differently: Inject 8-16 Units into the skin See admin instructions. Takes 8 units  with breakfast and lunch, and 16 units with dinner)  . niacin (SLO-NIACIN) 500 MG tablet Take 500 mg by mouth at bedtime.  Marland Kitchen omeprazole (PRILOSEC) 40 MG capsule Take 40 mg by mouth daily.  . ondansetron (ZOFRAN) 4 MG tablet Take 1 tablet (4 mg total) by mouth every 6 (six) hours as needed for nausea.  Marland Kitchen oxyCODONE (OXY IR/ROXICODONE) 5 MG immediate release tablet Take 1-2 tablets (5-10 mg total) by mouth every 4 (four) hours as needed for moderate pain.  Marland Kitchen saccharomyces boulardii (FLORASTOR) 250 MG capsule Take 1 capsule (250 mg total) by mouth 2 (two) times daily for 20 days.   No facility-administered encounter medications on file as of 07/16/2020.    Functional Status:  In your present state of health, do you have any difficulty performing the following activities: 07/16/2020 07/07/2020  Hearing? N N  Vision? N N  Difficulty concentrating or making decisions? N N  Walking or climbing stairs? N N  Dressing or bathing? N N  Doing errands, shopping? N N  Preparing Food and eating ? N -  Using the Toilet? N -  In the past six months, have you accidently leaked urine? N -  Do you have problems with loss of bowel control? N -  Managing your Medications? N -  Managing your Finances? N -  Housekeeping or managing your Housekeeping? N -  Some recent data might be hidden    Fall/Depression Screening: Fall Risk  07/16/2020 07/16/2020 01/07/2015  Falls in the past year? 0 0 No   PHQ 2/9 Scores 07/16/2020 01/07/2015   PHQ - 2 Score 0 0    Assessment:  Goals Addressed            This Visit's Progress   . Careful Skin Care- Watch surgical site for infection.       Barriers: Knowledge Timeframe:  Short-Term Goal Priority:  High Start Date:    07/16/20                         Expected End Date:      08/26/20                 Follow Up Date 07/27/20   - clean and dry skin well  Monitor for signs of infection: redness, swelling, or increased pain to site.    Why is this important?     Taking really good care of your skin will help to keep your skin unbroken.    Notes: 07/16/20 Notify physician for any changes.     . Make and Keep All Appointments       Barriers: None Timeframe:  Long-Range Goal Priority:  High Start Date:    07/16/20                         Expected End Date:  10/27/20                    Follow Up Date 08/26/20   - ask family or friend for a ride - keep a calendar with appointment dates    Why is this important?    Part of staying healthy is seeing the doctor for follow-up care.   If you forget your appointments, there are some things you can do to stay on track.    Notes: 07/16/20 No problems with transportation    . Monitor and Manage My Blood Sugar-Diabetes Type 2       Barriers: Knowledge Timeframe:  Long-Range Goal Priority:  High Start Date:     07/16/20                        Expected End Date:     10/27/20                  Follow Up Date 08/26/20    - check blood sugar at prescribed times - take the blood sugar log to all doctor visits    Why is this important?    Checking your blood sugar at home helps to keep it from getting very high or very low.   Writing the results in a diary or log helps the doctor know how to care for you.   Your blood sugar log should have the time, date and the results.   Also, write down the amount of insulin or other medicine that you take.   Other information, like what you ate, exercise done and how you were feeling,  will  also be helpful.     Notes: 07/16/20 Continue to monitor sugars and limit carbohydrates.        Plan: RN CM will provide ongoing education and support to patient through phone calls.   RN CM will send welcome packet with consent to patient.   RN CM will send initial barriers letter, assessment, and care plan to primary care physician.   RN CM will contact patient in the next 1-2 weeks.  Follow-up:  Patient agrees to Care Plan and Follow-up.   Jone Baseman, RN, MSN [place] [facility] Care Management Coordinator Direct Line [phone] Cell [phone] Toll Free: [phone]  Fax: [phone]

## 2020-07-18 DIAGNOSIS — K8000 Calculus of gallbladder with acute cholecystitis without obstruction: Secondary | ICD-10-CM | POA: Diagnosis not present

## 2020-07-18 DIAGNOSIS — I129 Hypertensive chronic kidney disease with stage 1 through stage 4 chronic kidney disease, or unspecified chronic kidney disease: Secondary | ICD-10-CM | POA: Diagnosis not present

## 2020-07-18 DIAGNOSIS — Z48815 Encounter for surgical aftercare following surgery on the digestive system: Secondary | ICD-10-CM | POA: Diagnosis not present

## 2020-07-18 DIAGNOSIS — N189 Chronic kidney disease, unspecified: Secondary | ICD-10-CM | POA: Diagnosis not present

## 2020-07-18 DIAGNOSIS — K651 Peritoneal abscess: Secondary | ICD-10-CM | POA: Diagnosis not present

## 2020-07-18 DIAGNOSIS — E1165 Type 2 diabetes mellitus with hyperglycemia: Secondary | ICD-10-CM | POA: Diagnosis not present

## 2020-07-18 DIAGNOSIS — E7800 Pure hypercholesterolemia, unspecified: Secondary | ICD-10-CM | POA: Diagnosis not present

## 2020-07-18 DIAGNOSIS — B9689 Other specified bacterial agents as the cause of diseases classified elsewhere: Secondary | ICD-10-CM | POA: Diagnosis not present

## 2020-07-18 DIAGNOSIS — Z4801 Encounter for change or removal of surgical wound dressing: Secondary | ICD-10-CM | POA: Diagnosis not present

## 2020-07-18 LAB — CULTURE, BLOOD (ROUTINE X 2)
Culture: NO GROWTH
Culture: NO GROWTH
Special Requests: ADEQUATE
Special Requests: ADEQUATE

## 2020-07-19 DIAGNOSIS — E1169 Type 2 diabetes mellitus with other specified complication: Secondary | ICD-10-CM | POA: Diagnosis not present

## 2020-07-19 DIAGNOSIS — E7800 Pure hypercholesterolemia, unspecified: Secondary | ICD-10-CM | POA: Diagnosis not present

## 2020-07-19 DIAGNOSIS — M130 Polyarthritis, unspecified: Secondary | ICD-10-CM | POA: Diagnosis not present

## 2020-07-19 DIAGNOSIS — K82A1 Gangrene of gallbladder in cholecystitis: Secondary | ICD-10-CM | POA: Diagnosis not present

## 2020-07-19 DIAGNOSIS — N189 Chronic kidney disease, unspecified: Secondary | ICD-10-CM | POA: Diagnosis not present

## 2020-07-19 DIAGNOSIS — K8000 Calculus of gallbladder with acute cholecystitis without obstruction: Secondary | ICD-10-CM | POA: Diagnosis not present

## 2020-07-19 DIAGNOSIS — K750 Abscess of liver: Secondary | ICD-10-CM | POA: Diagnosis not present

## 2020-07-19 DIAGNOSIS — I129 Hypertensive chronic kidney disease with stage 1 through stage 4 chronic kidney disease, or unspecified chronic kidney disease: Secondary | ICD-10-CM | POA: Diagnosis not present

## 2020-07-19 DIAGNOSIS — Z4801 Encounter for change or removal of surgical wound dressing: Secondary | ICD-10-CM | POA: Diagnosis not present

## 2020-07-19 DIAGNOSIS — I10 Essential (primary) hypertension: Secondary | ICD-10-CM | POA: Diagnosis not present

## 2020-07-19 DIAGNOSIS — B9689 Other specified bacterial agents as the cause of diseases classified elsewhere: Secondary | ICD-10-CM | POA: Diagnosis not present

## 2020-07-19 DIAGNOSIS — Z48815 Encounter for surgical aftercare following surgery on the digestive system: Secondary | ICD-10-CM | POA: Diagnosis not present

## 2020-07-19 DIAGNOSIS — K651 Peritoneal abscess: Secondary | ICD-10-CM | POA: Diagnosis not present

## 2020-07-19 DIAGNOSIS — E1165 Type 2 diabetes mellitus with hyperglycemia: Secondary | ICD-10-CM | POA: Diagnosis not present

## 2020-07-21 ENCOUNTER — Other Ambulatory Visit: Payer: Self-pay

## 2020-07-21 ENCOUNTER — Other Ambulatory Visit: Payer: Self-pay | Admitting: Internal Medicine

## 2020-07-21 DIAGNOSIS — E7800 Pure hypercholesterolemia, unspecified: Secondary | ICD-10-CM | POA: Diagnosis not present

## 2020-07-21 DIAGNOSIS — I129 Hypertensive chronic kidney disease with stage 1 through stage 4 chronic kidney disease, or unspecified chronic kidney disease: Secondary | ICD-10-CM | POA: Diagnosis not present

## 2020-07-21 DIAGNOSIS — K8000 Calculus of gallbladder with acute cholecystitis without obstruction: Secondary | ICD-10-CM | POA: Diagnosis not present

## 2020-07-21 DIAGNOSIS — K651 Peritoneal abscess: Secondary | ICD-10-CM | POA: Diagnosis not present

## 2020-07-21 DIAGNOSIS — Z48815 Encounter for surgical aftercare following surgery on the digestive system: Secondary | ICD-10-CM | POA: Diagnosis not present

## 2020-07-21 DIAGNOSIS — K810 Acute cholecystitis: Secondary | ICD-10-CM

## 2020-07-21 DIAGNOSIS — Z4801 Encounter for change or removal of surgical wound dressing: Secondary | ICD-10-CM | POA: Diagnosis not present

## 2020-07-21 DIAGNOSIS — N189 Chronic kidney disease, unspecified: Secondary | ICD-10-CM | POA: Diagnosis not present

## 2020-07-21 DIAGNOSIS — E1165 Type 2 diabetes mellitus with hyperglycemia: Secondary | ICD-10-CM | POA: Diagnosis not present

## 2020-07-21 DIAGNOSIS — B9689 Other specified bacterial agents as the cause of diseases classified elsewhere: Secondary | ICD-10-CM | POA: Diagnosis not present

## 2020-07-21 NOTE — Patient Instructions (Signed)
Goals Addressed            This Visit's Progress   . Careful Skin Care- Watch surgical site for infection.   On track    Barriers: Knowledge Timeframe:  Short-Term Goal Priority:  High Start Date:    07/16/20                         Expected End Date:      08/26/20                 Follow Up Date 07/27/20   - clean and dry skin well  Monitor for signs of infection: redness, swelling, or increased pain to site.    Why is this important?     Taking really good care of your skin will help to keep your skin unbroken.    Notes: 07/16/20 Notify physician for any changes.  07/21/20 Continue to monitor for signs of infection.    . Make and Keep All Appointments   On track    Barriers: None Timeframe:  Long-Range Goal Priority:  High Start Date:    07/16/20                         Expected End Date:  10/27/20                    Follow Up Date 08/26/20   - call to cancel if needed    Why is this important?    Part of staying healthy is seeing the doctor for follow-up care.   If you forget your appointments, there are some things you can do to stay on track.    Notes: 07/16/20 No problems with transportation 07/21/20 Patient seen by PCP on 07/19/20    . Monitor and Manage My Blood Sugar-Diabetes Type 2   On track    Barriers: Knowledge Timeframe:  Long-Range Goal Priority:  High Start Date:     07/16/20                        Expected End Date:     10/27/20                  Follow Up Date 08/26/20    - check blood sugar at prescribed times - check blood sugar if I feel it is too high or too low - take the blood sugar meter to all doctor visits    Why is this important?    Checking your blood sugar at home helps to keep it from getting very high or very low.   Writing the results in a diary or log helps the doctor know how to care for you.   Your blood sugar log should have the time, date and the results.   Also, write down the amount of insulin or other medicine that you  take.   Other information, like what you ate, exercise done and how you were feeling, will also be helpful.     Notes: 07/16/20 Continue to monitor sugars and limit carbohydrates.  07/21/20 Continue diabetic regimen.

## 2020-07-21 NOTE — Patient Outreach (Signed)
[facility]) Care Management  [place]  07/21/2020   Sandra Washington 07-Apr-1940 [id number]  Subjective: Telephone call to patient for follow up. Patient states she saw her PCP on Monday.  She states home health has not started but due to come today.  Discussed surgical monitoring for infection and notifying physician for any changes.  She verbalized understanding.    Objective:   Encounter Medications:  Outpatient Encounter Medications as of 07/21/2020  Medication Sig  . ACCU-CHEK SOFTCLIX LANCETS lancets Use bid  . amLODipine (NORVASC) 5 MG tablet Take 5 mg by mouth 2 (two) times daily.  Marland Kitchen amoxicillin-clavulanate (AUGMENTIN) 875-125 MG tablet Take 1 tablet by mouth every 12 (twelve) hours for 11 days.  Marland Kitchen atorvastatin (LIPITOR) 20 MG tablet Take 1 tablet (20 mg total) by mouth daily.  . Blood Glucose Monitoring Suppl (ACCU-CHEK GUIDE ME) w/Device KIT 1 each by Does not apply route 2 (two) times daily. Use accu chek guide me device to check blood sugar twice daily.DX:E11.65  . Cholecalciferol (VITAMIN D3) 5000 UNITS CAPS Take 5,000 Units by mouth daily.  Marland Kitchen glucose blood (ACCU-CHEK GUIDE) test strip 1 each by Other route 2 (two) times daily. Use as instructed  . insulin NPH Human (NOVOLIN N RELION) 100 UNIT/ML injection 8 U in am and 12 U hs (Patient taking differently: Inject 8-12 Units into the skin See admin instructions. Takes 8 units in the morning and 12 units at night)  . insulin regular (NOVOLIN R RELION) 100 units/mL injection INJECT 8 UNITS SUBCUTANEOUSLY WITH BREAKFAST AND LUNCH, AND 16 UNITS WITH DINNER (Patient taking differently: Inject 8-16 Units into the skin See admin instructions. Takes 8 units  with breakfast and lunch, and 16 units with dinner)  . niacin (SLO-NIACIN) 500 MG tablet Take 500 mg by mouth at bedtime.  Marland Kitchen omeprazole (PRILOSEC) 40 MG capsule Take 40 mg by mouth daily.  . ondansetron (ZOFRAN) 4 MG tablet Take 1 tablet (4 mg total)  by mouth every 6 (six) hours as needed for nausea.  Marland Kitchen oxyCODONE (OXY IR/ROXICODONE) 5 MG immediate release tablet Take 1-2 tablets (5-10 mg total) by mouth every 4 (four) hours as needed for moderate pain.  Marland Kitchen saccharomyces boulardii (FLORASTOR) 250 MG capsule Take 1 capsule (250 mg total) by mouth 2 (two) times daily for 20 days.   No facility-administered encounter medications on file as of 07/21/2020.    Functional Status:  In your present state of health, do you have any difficulty performing the following activities: 07/16/2020 07/07/2020  Hearing? N N  Vision? N N  Difficulty concentrating or making decisions? N N  Walking or climbing stairs? N N  Dressing or bathing? N N  Doing errands, shopping? N N  Preparing Food and eating ? N -  Using the Toilet? N -  In the past six months, have you accidently leaked urine? N -  Do you have problems with loss of bowel control? N -  Managing your Medications? N -  Managing your Finances? N -  Housekeeping or managing your Housekeeping? N -  Some recent data might be hidden    Fall/Depression Screening: Fall Risk  07/16/2020 07/16/2020 01/07/2015  Falls in the past year? 0 0 No   PHQ 2/9 Scores 07/16/2020 01/07/2015  PHQ - 2 Score 0 0    Assessment:  Goals Addressed            This Visit's Progress   . Careful Skin Care- Watch surgical site  for infection.   On track    Barriers: Knowledge Timeframe:  Short-Term Goal Priority:  High Start Date:    07/16/20                         Expected End Date:      08/26/20                 Follow Up Date 07/27/20   - clean and dry skin well  Monitor for signs of infection: redness, swelling, or increased pain to site.    Why is this important?     Taking really good care of your skin will help to keep your skin unbroken.    Notes: 07/16/20 Notify physician for any changes.  07/21/20 Continue to monitor for signs of infection.    . Make and Keep All Appointments   On track     Barriers: None Timeframe:  Long-Range Goal Priority:  High Start Date:    07/16/20                         Expected End Date:  10/27/20                    Follow Up Date 08/26/20   - call to cancel if needed    Why is this important?    Part of staying healthy is seeing the doctor for follow-up care.   If you forget your appointments, there are some things you can do to stay on track.    Notes: 07/16/20 No problems with transportation 07/21/20 Patient seen by PCP on 07/19/20    . Monitor and Manage My Blood Sugar-Diabetes Type 2   On track    Barriers: Knowledge Timeframe:  Long-Range Goal Priority:  High Start Date:     07/16/20                        Expected End Date:     10/27/20                  Follow Up Date 08/26/20    - check blood sugar at prescribed times - check blood sugar if I feel it is too high or too low - take the blood sugar meter to all doctor visits    Why is this important?    Checking your blood sugar at home helps to keep it from getting very high or very low.   Writing the results in a diary or log helps the doctor know how to care for you.   Your blood sugar log should have the time, date and the results.   Also, write down the amount of insulin or other medicine that you take.   Other information, like what you ate, exercise done and how you were feeling, will also be helpful.     Notes: 07/16/20 Continue to monitor sugars and limit carbohydrates.  07/21/20 Continue diabetic regimen.       Plan: RN CM will follow up with patient again in the next 2 weeks.  Follow-up:  Patient agrees to Care Plan and Follow-up.   Jone Baseman, RN, MSN [place] [facility] Care Management Coordinator Direct Line [phone] Cell [phone] Toll Free: [phone]  Fax: [phone]

## 2020-07-22 ENCOUNTER — Ambulatory Visit (INDEPENDENT_AMBULATORY_CARE_PROVIDER_SITE_OTHER): Payer: Medicare HMO | Admitting: General Surgery

## 2020-07-22 DIAGNOSIS — K651 Peritoneal abscess: Secondary | ICD-10-CM

## 2020-07-22 DIAGNOSIS — K810 Acute cholecystitis: Secondary | ICD-10-CM

## 2020-07-22 MED ORDER — OXYCODONE HCL 5 MG PO TABS: 5.0000 mg | ORAL_TABLET | ORAL | 0 refills | Status: DC | PRN

## 2020-07-22 NOTE — Progress Notes (Signed)
[facility]  Patient with IR drain in gallbladder fossa with infected collection. She has no more pain medication and is hurting on occasion.   Tylenol as needed. And roxicodone refilled.  Appetite is not great but she is doing some Glucerna now.    She has an IR appt 08/05/2020 and will hopefully get the drain out. She is still taking antibiotics. She is in [place] with [place] son right now.   Curlene Labrum, MD [facility] [address] [place], [place] [address] [phone] (office)

## 2020-07-27 DIAGNOSIS — Z794 Long term (current) use of insulin: Secondary | ICD-10-CM | POA: Diagnosis not present

## 2020-07-27 DIAGNOSIS — E1100 Type 2 diabetes mellitus with hyperosmolarity without nonketotic hyperglycemic-hyperosmolar coma (NKHHC): Secondary | ICD-10-CM | POA: Diagnosis not present

## 2020-07-27 DIAGNOSIS — I10 Essential (primary) hypertension: Secondary | ICD-10-CM | POA: Diagnosis not present

## 2020-07-27 DIAGNOSIS — E783 Hyperchylomicronemia: Secondary | ICD-10-CM | POA: Diagnosis not present

## 2020-07-28 DIAGNOSIS — K8000 Calculus of gallbladder with acute cholecystitis without obstruction: Secondary | ICD-10-CM | POA: Diagnosis not present

## 2020-07-28 DIAGNOSIS — Z48815 Encounter for surgical aftercare following surgery on the digestive system: Secondary | ICD-10-CM | POA: Diagnosis not present

## 2020-07-28 DIAGNOSIS — E1165 Type 2 diabetes mellitus with hyperglycemia: Secondary | ICD-10-CM | POA: Diagnosis not present

## 2020-07-28 DIAGNOSIS — N189 Chronic kidney disease, unspecified: Secondary | ICD-10-CM | POA: Diagnosis not present

## 2020-07-28 DIAGNOSIS — E7800 Pure hypercholesterolemia, unspecified: Secondary | ICD-10-CM | POA: Diagnosis not present

## 2020-07-28 DIAGNOSIS — Z4801 Encounter for change or removal of surgical wound dressing: Secondary | ICD-10-CM | POA: Diagnosis not present

## 2020-07-28 DIAGNOSIS — B9689 Other specified bacterial agents as the cause of diseases classified elsewhere: Secondary | ICD-10-CM | POA: Diagnosis not present

## 2020-07-28 DIAGNOSIS — K651 Peritoneal abscess: Secondary | ICD-10-CM | POA: Diagnosis not present

## 2020-07-28 DIAGNOSIS — I129 Hypertensive chronic kidney disease with stage 1 through stage 4 chronic kidney disease, or unspecified chronic kidney disease: Secondary | ICD-10-CM | POA: Diagnosis not present

## 2020-07-29 DIAGNOSIS — E7800 Pure hypercholesterolemia, unspecified: Secondary | ICD-10-CM | POA: Diagnosis not present

## 2020-07-29 DIAGNOSIS — E1165 Type 2 diabetes mellitus with hyperglycemia: Secondary | ICD-10-CM | POA: Diagnosis not present

## 2020-07-29 DIAGNOSIS — K651 Peritoneal abscess: Secondary | ICD-10-CM | POA: Diagnosis not present

## 2020-07-29 DIAGNOSIS — N189 Chronic kidney disease, unspecified: Secondary | ICD-10-CM | POA: Diagnosis not present

## 2020-07-29 DIAGNOSIS — K8000 Calculus of gallbladder with acute cholecystitis without obstruction: Secondary | ICD-10-CM | POA: Diagnosis not present

## 2020-07-29 DIAGNOSIS — I129 Hypertensive chronic kidney disease with stage 1 through stage 4 chronic kidney disease, or unspecified chronic kidney disease: Secondary | ICD-10-CM | POA: Diagnosis not present

## 2020-07-29 DIAGNOSIS — B9689 Other specified bacterial agents as the cause of diseases classified elsewhere: Secondary | ICD-10-CM | POA: Diagnosis not present

## 2020-07-29 DIAGNOSIS — Z4801 Encounter for change or removal of surgical wound dressing: Secondary | ICD-10-CM | POA: Diagnosis not present

## 2020-07-29 DIAGNOSIS — Z48815 Encounter for surgical aftercare following surgery on the digestive system: Secondary | ICD-10-CM | POA: Diagnosis not present

## 2020-07-30 ENCOUNTER — Other Ambulatory Visit: Payer: Self-pay

## 2020-07-30 DIAGNOSIS — Z4801 Encounter for change or removal of surgical wound dressing: Secondary | ICD-10-CM | POA: Diagnosis not present

## 2020-07-30 DIAGNOSIS — I129 Hypertensive chronic kidney disease with stage 1 through stage 4 chronic kidney disease, or unspecified chronic kidney disease: Secondary | ICD-10-CM | POA: Diagnosis not present

## 2020-07-30 DIAGNOSIS — E1165 Type 2 diabetes mellitus with hyperglycemia: Secondary | ICD-10-CM | POA: Diagnosis not present

## 2020-07-30 DIAGNOSIS — N189 Chronic kidney disease, unspecified: Secondary | ICD-10-CM | POA: Diagnosis not present

## 2020-07-30 DIAGNOSIS — E7800 Pure hypercholesterolemia, unspecified: Secondary | ICD-10-CM | POA: Diagnosis not present

## 2020-07-30 DIAGNOSIS — Z48815 Encounter for surgical aftercare following surgery on the digestive system: Secondary | ICD-10-CM | POA: Diagnosis not present

## 2020-07-30 DIAGNOSIS — K651 Peritoneal abscess: Secondary | ICD-10-CM | POA: Diagnosis not present

## 2020-07-30 DIAGNOSIS — K8000 Calculus of gallbladder with acute cholecystitis without obstruction: Secondary | ICD-10-CM | POA: Diagnosis not present

## 2020-07-30 DIAGNOSIS — B9689 Other specified bacterial agents as the cause of diseases classified elsewhere: Secondary | ICD-10-CM | POA: Diagnosis not present

## 2020-07-30 NOTE — Patient Outreach (Signed)
[facility]) Care Management  [place]  07/30/2020   Sandra Washington 03/22/1940 [id number]  Subjective: Telephone call to patient for follow up. Patient reports doing well.  No signs of infection per patient. Patient to go to IR on next week to hopefully have drain removed.  Home health involved.  Patient now has [facility] for care services.  Advised patient that CM would close her case and [facility] will take over.  She verbalized understanding.   Objective:   Encounter Medications:  Outpatient Encounter Medications as of 07/30/2020  Medication Sig  . ACCU-CHEK SOFTCLIX LANCETS lancets Use bid  . amLODipine (NORVASC) 5 MG tablet Take 5 mg by mouth 2 (two) times daily.  Marland Kitchen atorvastatin (LIPITOR) 20 MG tablet Take 1 tablet (20 mg total) by mouth daily.  . Blood Glucose Monitoring Suppl (ACCU-CHEK GUIDE ME) w/Device KIT 1 each by Does not apply route 2 (two) times daily. Use accu chek guide me device to check blood sugar twice daily.DX:E11.65  . Cholecalciferol (VITAMIN D3) 5000 UNITS CAPS Take 5,000 Units by mouth daily.  Marland Kitchen glucose blood (ACCU-CHEK GUIDE) test strip 1 each by Other route 2 (two) times daily. Use as instructed  . insulin NPH Human (NOVOLIN N RELION) 100 UNIT/ML injection 8 U in am and 12 U hs (Patient taking differently: Inject 8-12 Units into the skin See admin instructions. Takes 8 units in the morning and 12 units at night)  . insulin regular (NOVOLIN R RELION) 100 units/mL injection INJECT 8 UNITS SUBCUTANEOUSLY WITH BREAKFAST AND LUNCH, AND 16 UNITS WITH DINNER (Patient taking differently: Inject 8-16 Units into the skin See admin instructions. Takes 8 units  with breakfast and lunch, and 16 units with dinner)  . niacin (SLO-NIACIN) 500 MG tablet Take 500 mg by mouth at bedtime.  Marland Kitchen omeprazole (PRILOSEC) 40 MG capsule Take 40 mg by mouth daily.  . ondansetron (ZOFRAN) 4 MG tablet Take 1 tablet (4 mg total) by mouth every 6 (six) hours as needed  for nausea.  Marland Kitchen oxyCODONE (ROXICODONE) 5 MG immediate release tablet Take 1 tablet (5 mg total) by mouth every 4 (four) hours as needed for severe pain or breakthrough pain.  Marland Kitchen saccharomyces boulardii (FLORASTOR) 250 MG capsule Take 1 capsule (250 mg total) by mouth 2 (two) times daily for 20 days.   No facility-administered encounter medications on file as of 07/30/2020.    Functional Status:  In your present state of health, do you have any difficulty performing the following activities: 07/16/2020 07/07/2020  Hearing? N N  Vision? N N  Difficulty concentrating or making decisions? N N  Walking or climbing stairs? N N  Dressing or bathing? N N  Doing errands, shopping? N N  Preparing Food and eating ? N -  Using the Toilet? N -  In the past six months, have you accidently leaked urine? N -  Do you have problems with loss of bowel control? N -  Managing your Medications? N -  Managing your Finances? N -  Housekeeping or managing your Housekeeping? N -  Some recent data might be hidden    Fall/Depression Screening: Fall Risk  07/16/2020 07/16/2020 01/07/2015  Falls in the past year? 0 0 No   PHQ 2/9 Scores 07/16/2020 01/07/2015  PHQ - 2 Score 0 0    Assessment:  Goals Addressed            This Visit's Progress   . Careful Skin Care- Watch surgical site for  infection.   On track    Barriers: Knowledge Timeframe:  Short-Term Goal Priority:  High Start Date:    07/16/20                         Expected End Date:      08/26/20                 Follow Up Date 07/27/20   - clean and dry skin well  Monitor for signs of infection: redness, swelling, or increased pain to site.    Why is this important?     Taking really good care of your skin will help to keep your skin unbroken.    Notes: 07/16/20 Notify physician for any changes.  07/21/20 Continue to monitor for signs of infection. 07/30/20 Patient now with [facility]    . COMPLETED: Make and Keep All Appointments        Barriers: None Timeframe:  Long-Range Goal Priority:  High Start Date:    07/16/20                         Expected End Date:  10/27/20                    Follow Up Date 08/26/20   - call to cancel if needed    Why is this important?    Part of staying healthy is seeing the doctor for follow-up care.   If you forget your appointments, there are some things you can do to stay on track.    Notes: 07/16/20 No problems with transportation 07/21/20 Patient seen by PCP on 07/19/20    . Monitor and Manage My Blood Sugar-Diabetes Type 2   On track    Barriers: Knowledge Timeframe:  Long-Range Goal Priority:  High Start Date:     07/16/20                        Expected End Date:     10/27/20                  Follow Up Date 08/26/20    - check blood sugar at prescribed times - check blood sugar if I feel it is too high or too low - take the blood sugar meter to all doctor visits    Why is this important?    Checking your blood sugar at home helps to keep it from getting very high or very low.   Writing the results in a diary or log helps the doctor know how to care for you.   Your blood sugar log should have the time, date and the results.   Also, write down the amount of insulin or other medicine that you take.   Other information, like what you ate, exercise done and how you were feeling, will also be helpful.     Notes: 07/16/20 Continue to monitor sugars and limit carbohydrates.  07/21/20 Continue diabetic regimen. 07/30/20 Patient now with [facility]       Plan: RN CM will close case.  Jone Baseman, RN, MSN [place] [facility] Care Management Coordinator Direct Line [phone] Cell [phone] Toll Free: [phone]  Fax: [phone]

## 2020-08-02 NOTE — Progress Notes (Signed)
[facility] Note  08/04/2020     CHIEF COMPLAINT Patient presents for Retina Follow Up   HISTORY OF PRESENT ILLNESS: Sandra Washington is a 80 y.o. female who presents to the clinic today for:   HPI    Retina Follow Up    Patient presents with  Diabetic Retinopathy.  In both eyes.  Duration of 1 year.  Since onset it is stable.  I, the attending physician,  performed the HPI with the patient and updated documentation appropriately.          Comments    Pt here for 12 month retinal follow up NPDR OU. Pt states vision is doing fine. Reports natural eye changes to vision, hasnt updated her specs. No ocular pain or discomfort. Blood sugar was 107 this AM, most recent A1C level 6.01 June 2020. Pt did just get out of the hospital after a 13 day stay for gallbladder infection/cholecystectomy.        Last edited by Bernarda Caffey, MD on 08/04/2020  4:47 PM. (History)    pt states vision is going well, she was recently hospitalized for a gallbladder infection / cholecystectomy   Referring physician: Lucianne Lei, MD [address] [place],  [place] [address]  HISTORICAL INFORMATION:   Selected notes from the MEDICAL RECORD NUMBER Referred by Dr. Madelin Headings for concern of diabetic retinopathy    CURRENT MEDICATIONS: No current outpatient medications on file. (Ophthalmic Drugs)   No current facility-administered medications for this visit. (Ophthalmic Drugs)   Current Outpatient Medications (Other)  Medication Sig  . ACCU-CHEK SOFTCLIX LANCETS lancets Use bid  . amLODipine (NORVASC) 5 MG tablet Take 5 mg by mouth 2 (two) times daily.  Marland Kitchen atorvastatin (LIPITOR) 20 MG tablet Take 1 tablet (20 mg total) by mouth daily.  . Blood Glucose Monitoring Suppl (ACCU-CHEK GUIDE ME) w/Device KIT 1 each by Does not apply route 2 (two) times daily. Use accu chek guide me device to check blood sugar twice daily.DX:E11.65  . Cholecalciferol (VITAMIN D3) 5000 UNITS  CAPS Take 5,000 Units by mouth daily.  Marland Kitchen glucose blood (ACCU-CHEK GUIDE) test strip 1 each by Other route 2 (two) times daily. Use as instructed  . insulin NPH Human (NOVOLIN N RELION) 100 UNIT/ML injection 8 U in am and 12 U hs (Patient taking differently: Inject 8-12 Units into the skin See admin instructions. Takes 8 units in the morning and 12 units at night)  . insulin regular (NOVOLIN R RELION) 100 units/mL injection INJECT 8 UNITS SUBCUTANEOUSLY WITH BREAKFAST AND LUNCH, AND 16 UNITS WITH DINNER (Patient taking differently: Inject 8-16 Units into the skin See admin instructions. Takes 8 units  with breakfast and lunch, and 16 units with dinner)  . niacin (SLO-NIACIN) 500 MG tablet Take 500 mg by mouth at bedtime.  Marland Kitchen omeprazole (PRILOSEC) 40 MG capsule Take 40 mg by mouth daily.  . ondansetron (ZOFRAN) 4 MG tablet Take 1 tablet (4 mg total) by mouth every 6 (six) hours as needed for nausea.  Marland Kitchen oxyCODONE (ROXICODONE) 5 MG immediate release tablet Take 1 tablet (5 mg total) by mouth every 4 (four) hours as needed for severe pain or breakthrough pain.  Marland Kitchen saccharomyces boulardii (FLORASTOR) 250 MG capsule Take 1 capsule (250 mg total) by mouth 2 (two) times daily for 20 days.   No current facility-administered medications for this visit. (Other)      REVIEW OF SYSTEMS: ROS    Positive  for: Musculoskeletal, Endocrine, Eyes   Negative for: Constitutional, Gastrointestinal, Neurological, Skin, Genitourinary, HENT, Cardiovascular, Respiratory, Psychiatric, Allergic/Imm, Heme/Lymph   Last edited by Kingsley Spittle, COT on 08/04/2020  7:42 AM. (History)       ALLERGIES No Known Allergies  PAST MEDICAL HISTORY Past Medical History:  Diagnosis Date  . Chronic kidney disease    kidney stone  . Diabetes mellitus without complication ([place])   . Hypertension   . [facility]roke [facility]    Past Surgical History:  Procedure Laterality Date  . BREAST BIOPSY Left   . CATARACT EXTRACTION Bilateral 2018    Dr. Tommy Rainwater  . CHOLECYSTECTOMY N/A 07/01/2020   Procedure: LAPAROSCOPIC CHOLECYSTECTOMY;  Surgeon: Virl Cagey, MD;  Location: [facility];  Service: General;  Laterality: N/A;  . HAND SURGERY    . NO PAST SURGERIES      FAMILY HISTORY History reviewed. No pertinent family history.  SOCIAL HISTORY Social History   Tobacco Use  . Smoking status: Former Research scientist (life sciences)  . Smokeless tobacco: Never Used  Substance Use Topics  . Alcohol use: No  . Drug use: No         OPHTHALMIC EXAM:  Base Eye Exam    Visual Acuity (Snellen - Linear)      Right Left   Dist cc 20/25 20/20   Dist ph cc 20/20    Correction: Glasses       Tonometry (Tonopen, 7:48 AM)      Right Left   Pressure 10 10       Pupils      Dark Light Shape React APD   Right 3 2 Round Brisk None   Left 3 2 Round Brisk None       Visual Fields (Counting fingers)      Left Right    Full Full       Extraocular Movement      Right Left    Full, Ortho Full, Ortho       Neuro/Psych    Oriented x3: Yes   Mood/Affect: Normal       Dilation    Both eyes: 1.0% Mydriacyl, 2.5% Phenylephrine @ 7:48 AM        Slit Lamp and Fundus Exam    Slit Lamp Exam      Right Left   Lids/Lashes Dermatochalasis - upper lid, Meibomian gland dysfunction Dermatochalasis - upper lid, Meibomian gland dysfunction   Conjunctiva/Sclera nasal and temporal Pinguecula, Melanosis nasal and temporal Pinguecula, Melanosis   Cornea Arcus, 1+ Punctate epithelial erosions, Well healed temporal cataract wounds, mild EBMD Arcus, 1+ Punctate epithelial erosions, Well healed temporal cataract wounds, mild EBMD   Anterior Chamber deep, clear, narrow temporal angle Deep and quiet   Iris Round and moderately dilated, focal iris atrophy at 1100  --  PI not open, No NVI Round and moderately dilated, small patent PI at 0200, mild bowing , No NVI   Lens PC IOL in good position with open PC, mild elschnig pearls superiorly/inferiorly PC IOL in good  position with open PC   Vitreous Vitreous syneresis Vitreous syneresis       Fundus Exam      Right Left   Disc Pink and Sharp Pink and Sharp   C/D Ratio 0.2 0.2   Macula Flat, Blunted foveal reflex, rare Microaneurysms, focal drusen SN to fovea, no edema Flat, Blunted foveal reflex, scattered Microaneurysms, no edema   Vessels mild Vascular attenuation attenuated, mild tortuousity   Periphery Attached, scattered  IRH, prominent blot heme inferior to disc Attached, scattered IRH        Refraction    Wearing Rx      Sphere Cylinder Axis Add   Right -2.25 +2.25 180 +2.50   Left -1.00 +1.50 175 +2.50   Type: prog          IMAGING AND PROCEDURES  Imaging and Procedures for '@TODAY' @  OCT, Retina - OU - Both Eyes       Right Eye Quality was good. Central Foveal Thickness: 259. Progression has been stable. Findings include normal foveal contour, no SRF, no IRF (Trace cystic changes superior fovea, trace vitreous opacities).   Left Eye Quality was good. Central Foveal Thickness: 248. Progression has been stable. Findings include normal foveal contour, no SRF, intraretinal hyper-reflective material, intraretinal fluid (Mild cystic changes superior fovea).   Notes *Images captured and stored on drive  Diagnosis / Impression:  OD: Trace cystic changes superior fovea, trace vitreous opacities OS: Mild cystic changes superior fovea   Clinical management:  See below  Abbreviations: NFP - Normal foveal profile. CME - cystoid macular edema. PED - pigment epithelial detachment. IRF - intraretinal fluid. SRF - subretinal fluid. EZ - ellipsoid zone. ERM - epiretinal membrane. ORA - outer retinal atrophy. ORT - outer retinal tubulation. SRHM - subretinal hyper-reflective material                 ASSESSMENT/PLAN:    ICD-10-CM   1. Moderate nonproliferative diabetic retinopathy of both eyes without macular edema associated with type 2 diabetes mellitus ([place])  T51.7616   2.  Retinal edema  H35.81 OCT, Retina - OU - Both Eyes  3. Essential hypertension  I10   4. Hypertensive retinopathy of both eyes  H35.033   5. Pseudophakia of both eyes  Z96.1   6. Anatomical narrow angle  H40.039     1,2. Moderate nonproliferative diabetic retinopathy w/o DME, OU  - exam with scattered MA OU  - BCVA 20/20 OU  - OCT OD: Trace cystic changes superior fovea, trace vitreous opacities; OS: Mild cystic changes superior fovea  - discussed findings, prognosis  - recommend continued monitoring  - f/u in 6 mos, sooner prn -- DFE/OCT  3,4. Hypertensive retinopathy OU  - discussed importance of tight BP control  - monitor  5. Pseudophakia OU  - s/p CE/IOL OU  - beautiful surgeries by Dr. Talbert Forest, doing well  - monitor  6. Anatomical Narrow Angles OU  - s/p LPI OU by Dr. Talbert Forest  - OS patent LPI and open angle  - OD w/ closed PI  - saw Dr. Nancy Fetter on 09.21.20--no intervention recommended  Ophthalmic Meds Ordered this visit:  No orders of the defined types were placed in this encounter.      Return in about 6 months (around 02/03/2021) for f/u NPDR OU, DFE, OCT.  There are no Patient Instructions on file for this visit.   Explained the diagnoses, plan, and follow up with the patient and they expressed understanding.  Patient expressed understanding of the importance of proper follow up care.   This document serves as a record of services personally performed by Gardiner Sleeper, MD, PhD. It was created on their behalf by Roselee Nova, COMT. The creation of this record is the provider's dictation and/or activities during the visit.  Electronically signed by: Roselee Nova, COMT 08/04/20 4:49 PM  Gardiner Sleeper, M.D., Ph.D. Diseases & Surgery of the Retina and Vitreous [facility]  [place]  I have reviewed the above documentation for accuracy and completeness, and I agree with the above. Gardiner Sleeper, M.D., Ph.D. 08/04/20 4:49 PM   Abbreviations: M myopia  (nearsighted); A astigmatism; H hyperopia (farsighted); P presbyopia; Mrx spectacle prescription;  CTL contact lenses; OD right eye; OS left eye; OU both eyes  XT exotropia; ET esotropia; PEK punctate epithelial keratitis; PEE punctate epithelial erosions; DES dry eye syndrome; MGD meibomian gland dysfunction; ATs artificial tears; PFAT's preservative free artificial tears; [place] nuclear sclerotic cataract; PSC posterior subcapsular cataract; ERM epi-retinal membrane; PVD posterior vitreous detachment; RD retinal detachment; DM diabetes mellitus; DR diabetic retinopathy; NPDR non-proliferative diabetic retinopathy; PDR proliferative diabetic retinopathy; CSME clinically significant macular edema; DME diabetic macular edema; dbh dot blot hemorrhages; CWS cotton wool spot; POAG primary open angle glaucoma; C/D cup-to-disc ratio; HVF humphrey visual field; GVF goldmann visual field; OCT optical coherence tomography; IOP intraocular pressure; BRVO Branch retinal vein occlusion; CRVO central retinal vein occlusion; CRAO central retinal artery occlusion; BRAO branch retinal artery occlusion; RT retinal tear; SB scleral buckle; PPV pars plana vitrectomy; VH Vitreous hemorrhage; PRP panretinal laser photocoagulation; IVK intravitreal kenalog; VMT vitreomacular traction; MH Macular hole;  NVD neovascularization of the disc; NVE neovascularization elsewhere; AREDS age related eye disease study; ARMD age related macular degeneration; POAG primary open angle glaucoma; EBMD epithelial/anterior basement membrane dystrophy; ACIOL anterior chamber intraocular lens; IOL intraocular lens; PCIOL posterior chamber intraocular lens; Phaco/IOL phacoemulsification with intraocular lens placement; [place] photorefractive keratectomy; LASIK laser assisted in situ keratomileusis; HTN hypertension; DM diabetes mellitus; COPD chronic obstructive pulmonary disease

## 2020-08-04 ENCOUNTER — Other Ambulatory Visit: Payer: Self-pay

## 2020-08-04 ENCOUNTER — Encounter (INDEPENDENT_AMBULATORY_CARE_PROVIDER_SITE_OTHER): Payer: Self-pay | Admitting: Ophthalmology

## 2020-08-04 ENCOUNTER — Ambulatory Visit (INDEPENDENT_AMBULATORY_CARE_PROVIDER_SITE_OTHER): Payer: Medicare HMO | Admitting: Ophthalmology

## 2020-08-04 DIAGNOSIS — H3581 Retinal edema: Secondary | ICD-10-CM | POA: Diagnosis not present

## 2020-08-04 DIAGNOSIS — I10 Essential (primary) hypertension: Secondary | ICD-10-CM | POA: Diagnosis not present

## 2020-08-04 DIAGNOSIS — E1165 Type 2 diabetes mellitus with hyperglycemia: Secondary | ICD-10-CM | POA: Diagnosis not present

## 2020-08-04 DIAGNOSIS — H35033 Hypertensive retinopathy, bilateral: Secondary | ICD-10-CM | POA: Diagnosis not present

## 2020-08-04 DIAGNOSIS — B9689 Other specified bacterial agents as the cause of diseases classified elsewhere: Secondary | ICD-10-CM | POA: Diagnosis not present

## 2020-08-04 DIAGNOSIS — N189 Chronic kidney disease, unspecified: Secondary | ICD-10-CM | POA: Diagnosis not present

## 2020-08-04 DIAGNOSIS — H40039 Anatomical narrow angle, unspecified eye: Secondary | ICD-10-CM | POA: Diagnosis not present

## 2020-08-04 DIAGNOSIS — K651 Peritoneal abscess: Secondary | ICD-10-CM | POA: Diagnosis not present

## 2020-08-04 DIAGNOSIS — Z48815 Encounter for surgical aftercare following surgery on the digestive system: Secondary | ICD-10-CM | POA: Diagnosis not present

## 2020-08-04 DIAGNOSIS — E113393 Type 2 diabetes mellitus with moderate nonproliferative diabetic retinopathy without macular edema, bilateral: Secondary | ICD-10-CM | POA: Diagnosis not present

## 2020-08-04 DIAGNOSIS — I129 Hypertensive chronic kidney disease with stage 1 through stage 4 chronic kidney disease, or unspecified chronic kidney disease: Secondary | ICD-10-CM | POA: Diagnosis not present

## 2020-08-04 DIAGNOSIS — K8000 Calculus of gallbladder with acute cholecystitis without obstruction: Secondary | ICD-10-CM | POA: Diagnosis not present

## 2020-08-04 DIAGNOSIS — Z4801 Encounter for change or removal of surgical wound dressing: Secondary | ICD-10-CM | POA: Diagnosis not present

## 2020-08-04 DIAGNOSIS — E7800 Pure hypercholesterolemia, unspecified: Secondary | ICD-10-CM | POA: Diagnosis not present

## 2020-08-04 DIAGNOSIS — Z961 Presence of intraocular lens: Secondary | ICD-10-CM

## 2020-08-05 ENCOUNTER — Encounter: Payer: Self-pay | Admitting: *Deleted

## 2020-08-05 ENCOUNTER — Ambulatory Visit
Admission: RE | Admit: 2020-08-05 | Discharge: 2020-08-05 | Disposition: A | Discharge status: Home / Self Care | Payer: Medicare HMO | Source: Ambulatory Visit | Attending: Internal Medicine | Admitting: Internal Medicine

## 2020-08-05 DIAGNOSIS — S22070A Wedge compression fracture of T9-T10 vertebra, initial encounter for closed fracture: Secondary | ICD-10-CM | POA: Diagnosis not present

## 2020-08-05 DIAGNOSIS — K810 Acute cholecystitis: Secondary | ICD-10-CM

## 2020-08-05 DIAGNOSIS — Z978 Presence of other specified devices: Secondary | ICD-10-CM | POA: Diagnosis not present

## 2020-08-05 DIAGNOSIS — K6811 Postprocedural retroperitoneal abscess: Secondary | ICD-10-CM | POA: Diagnosis not present

## 2020-08-05 DIAGNOSIS — D259 Leiomyoma of uterus, unspecified: Secondary | ICD-10-CM | POA: Diagnosis not present

## 2020-08-05 DIAGNOSIS — K5790 Diverticulosis of intestine, part unspecified, without perforation or abscess without bleeding: Secondary | ICD-10-CM | POA: Diagnosis not present

## 2020-08-05 DIAGNOSIS — K449 Diaphragmatic hernia without obstruction or gangrene: Secondary | ICD-10-CM | POA: Diagnosis not present

## 2020-08-05 HISTORY — PX: IR RADIOLOGIST EVAL & MGMT: IMG5224

## 2020-08-05 LAB — AEROBIC BACTERIA, ID BY SEQ.

## 2020-08-05 LAB — ORGANISM ID BY SEQUENCING

## 2020-08-05 MED ORDER — IOPAMIDOL (ISOVUE-300) INJECTION 61%
100.0000 mL | Freq: Once | INTRAVENOUS | Status: AC | PRN
  Administered 2020-08-05: 100 mL via INTRAVENOUS

## 2020-08-05 NOTE — Progress Notes (Signed)
Chief Complaint: Patient was seen in consultation today for gallbladder fossa abscess drain in situ at the request of [place]  Referring Physician(s): Snider,Cynthia  History of Present Illness: Sandra Washington is a 80 y.o. female with a history of gallbladder fossa abscess following percutaneous cholecystectomy.  She has a percutaneous drainage catheter which has been in place since 07/07/2020.   Currently, she is doing quite well.  Drain output is clear and minimal (less than 20 mL daily).  The drain output appears to represent the volume she is flushing into the drainage catheter 3 times daily.  She denies pain, fever, chills, nausea or vomiting.   Past Medical History:  Diagnosis Date   Chronic kidney disease    kidney stone   Diabetes mellitus without complication ([place])    Hypertension    Stroke [facility])     Past Surgical History:  Procedure Laterality Date   BREAST BIOPSY Left    CATARACT EXTRACTION Bilateral 2018   Dr. Tommy Rainwater   CHOLECYSTECTOMY N/A 07/01/2020   Procedure: LAPAROSCOPIC CHOLECYSTECTOMY;  Surgeon: Virl Cagey, MD;  Location: [facility];  Service: General;  Laterality: N/A;   HAND SURGERY     IR RADIOLOGIST EVAL & MGMT  08/05/2020   NO PAST SURGERIES      Allergies: Patient has no known allergies.  Medications: Prior to Admission medications   Medication Sig Start Date End Date Taking? Authorizing Provider  ACCU-CHEK SOFTCLIX LANCETS lancets Use bid 05/01/17   Elayne Snare, MD  amLODipine (NORVASC) 5 MG tablet Take 5 mg by mouth 2 (two) times daily. 10/19/14   [provider]  atorvastatin (LIPITOR) 20 MG tablet Take 1 tablet (20 mg total) by mouth daily. 12/17/18   Elayne Snare, MD  Blood Glucose Monitoring Suppl (ACCU-CHEK GUIDE ME) w/Device KIT 1 each by Does not apply route 2 (two) times daily. Use accu chek guide me device to check blood sugar twice daily.DX:E11.65 12/23/18   Elayne Snare, MD  Cholecalciferol (VITAMIN D3) 5000  UNITS CAPS Take 5,000 Units by mouth daily.    [provider]  glucose blood (ACCU-CHEK GUIDE) test strip 1 each by Other route 2 (two) times daily. Use as instructed 12/17/18   Elayne Snare, MD  insulin NPH Human (NOVOLIN N RELION) 100 UNIT/ML injection 8 U in am and 12 U hs Patient taking differently: Inject 8-12 Units into the skin See admin instructions. Takes 8 units in the morning and 12 units at night 11/06/19   Elayne Snare, MD  insulin regular (NOVOLIN R RELION) 100 units/mL injection INJECT 8 UNITS SUBCUTANEOUSLY WITH BREAKFAST AND LUNCH, AND 16 UNITS WITH DINNER Patient taking differently: Inject 8-16 Units into the skin See admin instructions. Takes 8 units  with breakfast and lunch, and 16 units with dinner 11/06/19   Elayne Snare, MD  niacin (SLO-NIACIN) 500 MG tablet Take 500 mg by mouth at bedtime.    [provider]  omeprazole (PRILOSEC) 40 MG capsule Take 40 mg by mouth daily.    [provider]  ondansetron (ZOFRAN) 4 MG tablet Take 1 tablet (4 mg total) by mouth every 6 (six) hours as needed for nausea. 07/03/20   Raiford Noble Latif, DO  oxyCODONE (ROXICODONE) 5 MG immediate release tablet Take 1 tablet (5 mg total) by mouth every 4 (four) hours as needed for severe pain or breakthrough pain. 07/22/20   Virl Cagey, MD     No family history on file.  Social History  Socioeconomic History   Marital status: Widowed    Spouse name: Not on file   Number of children: Not on file   Years of education: Not on file   Highest education level: Not on file  Occupational History   Not on file  Tobacco Use   Smoking status: Former    Pack years: 0.00   Smokeless tobacco: Never  Substance and Sexual Activity   Alcohol use: No   Drug use: No   Sexual activity: Not on file  Other Topics Concern   Not on file  Social History Narrative   [facility]   Caffeine use: no   Social Determinants of Radio broadcast assistant Strain:  Not on file  Food Insecurity: Not on file  Transportation Needs: No Transportation Needs   Lack of Transportation (Medical): No   Lack of Transportation (Non-Medical): No  Physical Activity: Not on file  Stress: Not on file  Social Connections: Not on file     Review of Systems: A 12 point ROS discussed and pertinent positives are indicated in the HPI above.  All other systems are negative.  Review of Systems  Vital Signs: There were no vitals taken for this visit.  Physical Exam Constitutional:      General: She is not in acute distress.    Appearance: Normal appearance. She is not ill-appearing.  HENT:     Head: Normocephalic and atraumatic.  Eyes:     General: No scleral icterus. Abdominal:     General: Abdomen is flat.     Palpations: Abdomen is soft.     Tenderness: There is no abdominal tenderness. There is no guarding.       Comments: RUQ drain site, C/D/I  Skin:    General: Skin is warm and dry.  Neurological:     Mental Status: She is alert and oriented to person, place, and time.  Psychiatric:        Mood and Affect: Mood normal.        Behavior: Behavior normal.      Imaging: CT ABDOMEN PELVIS WO CONTRAST  Result Date: 07/14/2020 CLINICAL DATA:  80 year old female with history of cholecystectomy on 07/01/2020 with postoperative gallbladder fossa fluid collection status post percutaneous drainage on 07/07/2020. EXAM: CT ABDOMEN AND PELVIS WITHOUT CONTRAST TECHNIQUE: Multidetector CT imaging of the abdomen and pelvis was performed following the standard protocol without IV contrast. COMPARISON:  07/06/2020, 07/07/2020 FINDINGS: Lower chest: Resolution of previously visualized small right pleural effusion. There is trace residual right basilar subsegmental atelectasis. Hepatobiliary: Significant interval decreased size of previously visualized right subhepatic complex fluid collection which now measures approximately 4.2 x 2.3 x 2.4 cm (previously 7.6 x 6.7 x  7.2 cm) and contains foci of gas. The indwelling pigtail drain remains well position within the fluid collection from a mid axillary, percutaneous, transhepatic approach. The liver is otherwise normal in contour and attenuation. No evidence of focal mass, intra, or extrahepatic biliary ductal dilation. Status post cholecystectomy. Pancreas: Unremarkable. No pancreatic ductal dilatation or surrounding inflammatory changes. Spleen: Normal in size without focal abnormality. Adrenals/Urinary Tract: Adrenal glands are unremarkable. Kidneys are normal, without renal calculi, focal lesion, or hydronephrosis. Bladder is unremarkable. Stomach/Bowel: Stomach is within normal limits. Similar appearing duodenal diverticulum arising from the third portion measuring up to approximately 3.7 cm. Appendix appears normal. No evidence of bowel wall thickening, distention, or inflammatory changes. Vascular/Lymphatic: Aortic atherosclerosis. No enlarged abdominal or pelvic lymph nodes. Reproductive: Calcified  leiomyomatous uterus, unchanged. No adnexal masses. Other: Scattered injection site granulomas about the lower anterior abdomen. Tiny fat containing umbilical hernia. No ascites. Musculoskeletal: No acute or significant osseous findings. IMPRESSION: 1. Decreased size of previously visualized subhepatic abscess with unchanged position of indwelling transhepatic pigtail drain. 2. Resolution of previously visualized right pleural effusion. There is persistent right basilar subsegmental atelectasis. PLAN: Keep drain in place for now and continue routine flushes. IR will continue to follow. Ruthann Cancer, MD [facility] Radiology Electronically Signed   By: Ruthann Cancer MD   On: 07/14/2020 11:48   CT ABDOMEN PELVIS W CONTRAST  Result Date: 08/05/2020 CLINICAL DATA:  80 year old female with a history of cholecystectomy on 02/40/9735 complicated by postoperative gallbladder fossa fluid  collection requiring percutaneous drain placement on 07/07/2020. EXAM: CT ABDOMEN AND PELVIS WITH CONTRAST TECHNIQUE: Multidetector CT imaging of the abdomen and pelvis was performed using the standard protocol following bolus administration of intravenous contrast. CONTRAST:  121m ISOVUE-300 IOPAMIDOL (ISOVUE-300) INJECTION 61% COMPARISON:  Most recent prior CT scan of the abdomen and pelvis 07/14/2020 FINDINGS: Lower chest: No acute abnormality. Resolving atelectasis in the right middle and lower lobes. There is trace residual pleuroparenchymal scarring. Atherosclerotic calcifications again noted throughout the coronary arteries. Small hiatal hernia. Hepatobiliary: Well-positioned percutaneous drainage catheter in the gallbladder fossa. No residual undrained abscess or collection. No intra or extrahepatic biliary ductal dilatation. The gallbladder is surgically absent. No focal hepatic lesion. Pancreas: Unremarkable. No pancreatic ductal dilatation or surrounding inflammatory changes. Spleen: Normal in size without focal abnormality. Adrenals/Urinary Tract: Normal adrenal glands. The kidneys are also unremarkable. Ureters and bladder within normal limits. Stomach/Bowel: Large duodenal diverticulum arising from the superior margin of the proximal D3 segment. No focal bowel wall thickening or evidence of obstruction. Normal appendix in the right lower quadrant. Vascular/Lymphatic: Atherosclerotic plaque present throughout the abdominal aorta. No evidence of aneurysm. No suspicious lymphadenopathy. Reproductive: Multifocal degenerated and calcified uterine fibroids. No adnexal masses. Other: No evidence of abdominal wall hernia or ascites. Musculoskeletal: No acute fracture or aggressive appearing lytic or blastic osseous lesion. Unchanged chronic compression fracture of the superior endplate of T9. Lower lumbar degenerative disc disease present at L4-L5 and L5-S1. IMPRESSION: 1. Well-positioned gallbladder fossa  drainage catheter without evidence of residual undrained fluid collection. 2. No intra or extrahepatic biliary ductal dilatation. 3.  Aortic Atherosclerosis (ICD10-I70.0). 4. Additional findings as above without significant interval change. Electronically Signed   By: HJacqulynn CadetM.D.   On: 08/05/2020 12:32   CT ABDOMEN PELVIS W CONTRAST  Result Date: 07/06/2020 CLINICAL DATA:  Nonlocalized acute abdominal pain. EXAM: CT ABDOMEN AND PELVIS WITH CONTRAST TECHNIQUE: Multidetector CT imaging of the abdomen and pelvis was performed using the standard protocol following bolus administration of intravenous contrast. CONTRAST:  739mOMNIPAQUE IOHEXOL 300 MG/ML  SOLN COMPARISON:  CT abdomen pelvis 06/30/2020 FINDINGS: Lower chest: Trace right pleural effusion. Right lower lobe passive atelectasis. Coronary artery calcifications. Hepatobiliary: No focal liver abnormality is seen. Status post cholecystectomy with interval development of a 7.6 x 6.7 x 7.2 cm gallbladder fossa fluid and gas collection (5:34). Slight peripheral enhancement noted. No biliary dilatation. Pancreas: No focal lesion. Normal pancreatic contour. No surrounding inflammatory changes. No main pancreatic ductal dilatation. Spleen: Normal in size without focal abnormality. Adrenals/Urinary Tract: No adrenal nodule bilaterally. Bilateral kidneys enhance symmetrically. No hydronephrosis. No hydroureter. The urinary bladder is unremarkable. Stomach/Bowel: Stomach is within normal limits. No evidence of bowel wall thickening or dilatation. Redemonstration of  a third portion of the duodenum diverticulum (3:35). Fluid density within the lumen of the large bowel. Appendix appears normal. Vascular/Lymphatic: No abdominal aorta or iliac aneurysm. Mild-to-moderate atherosclerotic plaque of the aorta and its branches. No abdominal, pelvic, or inguinal lymphadenopathy. Reproductive: Multiple coarsely calcified lesions within the uterus likely represents  degenerative uterine fibroids. Other: Trace peripancreatic free fluid. No intraperitoneal free gas. Musculoskeletal: No abdominal wall hernia or abnormality. No suspicious lytic or blastic osseous lesions. No acute displaced fracture. Multilevel degenerative changes of the spine. IMPRESSION: 1. Status post cholecystectomy with interval development of a 7.6 x 6.7 cm gallbladder fossa abscess . 2. Trace right pleural effusion. 3. Multiple degenerative uterine fibroids. 4. Fast-incision state. Electronically Signed   By: Iven Finn M.D.   On: 07/06/2020 22:18   ECHOCARDIOGRAM COMPLETE  Result Date: 07/11/2020    ECHOCARDIOGRAM REPORT   Patient Name:   Sandra Washington Date of Exam: 07/11/2020 Medical Rec #:  [id number]              Height:       66.0 in Accession #:    [id number]             Weight:       187.2 lb Date of Birth:  24-Jul-1940             BSA:          1.944 m Patient Age:    35 years               BP:           131/51 mmHg Patient Gender: F                      HR:           69 bpm. Exam Location:  Inpatient Procedure: 2D Echo, Cardiac Doppler and Color Doppler Indications:    Bacteremia R 78.81  History:        Patient has no prior history of Echocardiogram examinations.                 Gallbladder surgery.  Sonographer:    Merrie Roof RDCS Referring Phys: [place]  1. Left ventricular ejection fraction, by estimation, is 60 to 65%. The left ventricle has normal function. The left ventricle has no regional wall motion abnormalities. There is mild left ventricular hypertrophy. Left ventricular diastolic parameters are consistent with Grade I diastolic dysfunction (impaired relaxation).  2. Right ventricular systolic function is normal. The right ventricular size is normal.  3. The mitral valve is abnormal. Significant thickening of anterior leaflet. Trivial mitral valve regurgitation. No evidence of mitral stenosis.  4. The aortic valve is tricuspid. Aortic valve  regurgitation is not visualized. Mild aortic valve stenosis. Vmax 2.2 m/s, MG 15mHg, AVA 1.5 cm^2, DI 0.48. Conclusion(s)/Recommendation(s): No clear vegetation seen, but significant thickening of anterior mitral valve leaflet. Trivial MR. If clinical suspicion for endocarditis, recommend TEE. FINDINGS  Left Ventricle: Left ventricular ejection fraction, by estimation, is 60 to 65%. The left ventricle has normal function. The left ventricle has no regional wall motion abnormalities. The left ventricular internal cavity size was normal in size. There is  mild left ventricular hypertrophy. Left ventricular diastolic parameters are consistent with Grade I diastolic dysfunction (impaired relaxation). Right Ventricle: The right ventricular size is normal. No increase in right ventricular wall thickness. Right ventricular systolic function is normal. Left Atrium: Left atrial size  was normal in size. Right Atrium: Right atrial size was normal in size. Pericardium: There is no evidence of pericardial effusion. Mitral Valve: The mitral valve is abnormal. There is moderate thickening of the mitral valve leaflet(s). Trivial mitral valve regurgitation. No evidence of mitral valve stenosis. Tricuspid Valve: The tricuspid valve is normal in structure. Tricuspid valve regurgitation is trivial. Aortic Valve: The aortic valve is tricuspid. Aortic valve regurgitation is not visualized. Mild aortic stenosis is present. Aortic valve mean gradient measures 9.0 mmHg. Aortic valve peak gradient measures 18.7 mmHg. Aortic valve area, by VTI measures 1.50 cm. Pulmonic Valve: The pulmonic valve was not well visualized. Pulmonic valve regurgitation is not visualized. Aorta: The aortic root and ascending aorta are structurally normal, with no evidence of dilitation. IAS/Shunts: The interatrial septum was not well visualized.  LEFT VENTRICLE PLAX 2D LVIDd:         4.50 cm  Diastology LVIDs:         2.90 cm  LV e' medial:    7.51 cm/s LV PW:          1.00 cm  LV E/e' medial:  9.6 LV IVS:        0.80 cm  LV e' lateral:   8.81 cm/s LVOT diam:     2.00 cm  LV E/e' lateral: 8.2 LV SV:         69 LV SV Index:   36 LVOT Area:     3.14 cm  RIGHT VENTRICLE RV Basal diam:  3.20 cm RV S prime:     13.20 cm/s TAPSE (M-mode): 2.2 cm LEFT ATRIUM             Index       RIGHT ATRIUM           Index LA diam:        4.20 cm 2.16 cm/m  RA Area:     10.90 cm LA Vol (A2C):   67.6 ml 34.77 ml/m RA Volume:   21.70 ml  11.16 ml/m LA Vol (A4C):   42.6 ml 21.91 ml/m LA Biplane Vol: 55.7 ml 28.65 ml/m  AORTIC VALVE AV Area (Vmax):    1.47 cm AV Area (Vmean):   1.51 cm AV Area (VTI):     1.50 cm AV Vmax:           216.00 cm/s AV Vmean:          140.000 cm/s AV VTI:            0.460 m AV Peak Grad:      18.7 mmHg AV Mean Grad:      9.0 mmHg LVOT Vmax:         101.00 cm/s LVOT Vmean:        67.100 cm/s LVOT VTI:          0.220 m LVOT/AV VTI ratio: 0.48  AORTA Ao Root diam: 2.80 cm Ao Asc diam:  2.70 cm MITRAL VALVE                TRICUSPID VALVE MV Area (PHT): 3.89 cm     TR Peak grad:   23.6 mmHg MV Decel Time: 195 msec     TR Vmax:        243.00 cm/s MV E velocity: 72.20 cm/s MV A velocity: 109.00 cm/s  SHUNTS MV E/A ratio:  0.66         Systemic VTI:  0.22 m  Systemic Diam: 2.00 cm Oswaldo Milian MD Electronically signed by Oswaldo Milian MD Signature Date/Time: 07/11/2020/5:40:13 PM    Final    OCT, Retina - OU - Both Eyes  Result Date: 08/04/2020 Right Eye Quality was good. Central Foveal Thickness: 259. Progression has been stable. Findings include normal foveal contour, no SRF, no IRF (Trace cystic changes superior fovea, trace vitreous opacities). Left Eye Quality was good. Central Foveal Thickness: 248. Progression has been stable. Findings include normal foveal contour, no SRF, intraretinal hyper-reflective material, intraretinal fluid (Mild cystic changes superior fovea). Notes *Images captured and stored on drive  Diagnosis / Impression: OD: Trace cystic changes superior fovea, trace vitreous opacities OS: Mild cystic changes superior fovea Clinical management: See below Abbreviations: NFP - Normal foveal profile. CME - cystoid macular edema. PED - pigment epithelial detachment. IRF - intraretinal fluid. SRF - subretinal fluid. EZ - ellipsoid zone. ERM - epiretinal membrane. ORA - outer retinal atrophy. ORT - outer retinal tubulation. SRHM - subretinal hyper-reflective material   CT IMAGE GUIDED FLUID DRAIN BY CATHETER  Result Date: 07/07/2020 INDICATION: History of cholecystectomy performed on 05/05 by Dr. Constance Haw, now with fluid collection within the gallbladder fossa worrisome for abscess and/or bile leak. Please perform image guided drainage catheter placement for infection source control purposes. EXAM: ULTRASOUND AND CT GUIDED DRAINAGE CATHETER PLACEMENT INTO GALLBLADDER FOSSA FLUID COLLECTION COMPARISON:  CT abdomen pelvis-07/06/2020; nuclear medicine HIDA scan-earlier same day MEDICATIONS: The patient is currently admitted to the hospital and receiving intravenous antibiotics. The antibiotics were administered within an appropriate time frame prior to the initiation of the procedure. ANESTHESIA/SEDATION: Moderate (conscious) sedation was employed during this procedure. A total of Versed 3 mg and Fentanyl 100 mcg was administered intravenously. Moderate Sedation Time: 20 minutes. The patient's level of consciousness and vital signs were monitored continuously by radiology nursing throughout the procedure under my direct supervision. CONTRAST:  None COMPLICATIONS: None immediate. PROCEDURE: Informed written consent was obtained from the patient after a discussion of the risks, benefits and alternatives to treatment. The patient was placed supine on the CT gantry and a pre procedural CT was performed re-demonstrating the known abscess/fluid collection within the gallbladder fossa with dominant component measuring  approximately 8.0 x 6.5 cm (image 38, series 2). The table position was marked and the complex mixed echogenic fluid collection was identified sonographically. The procedure was planned. A timeout was performed prior to the initiation of the procedure. The skin overlying the right upper abdominal quadrant was prepped and draped in the usual sterile fashion. After the overlying soft tissues were anesthetized with 1% lidocaine with epinephrine, an 18 gauge trocar needle was utilized to access the indeterminate fluid collection in the gallbladder fossa under direct ultrasound guidance using a transhepatic approach. Multiple ultrasound images were saved procedural documentation purposes. Appropriate positioning was confirmed with CT imaging. Next, the track was dilated allowing placement of a 12 French percutaneous catheter with end coiled and locked within the medial aspect of the indeterminate fluid collection. Next, approximately 175 cc of bilious appearing fluid was aspirated and postprocedural imaging was obtained The drainage catheter was connected to a gravity bag and secured in place within interrupted suture and a Stat Lock device. A dressing was applied. The patient tolerated the procedure well without immediate postprocedural complication. IMPRESSION: Successful ultrasound and CT guided placement of a 12 French all purpose drain catheter into the gallbladder fossa fluid collection with aspiration of 175 cc of bilious appearing fluid. Samples were sent to the laboratory as  requested by the ordering clinical team. Electronically Signed   By: Sandi Mariscal M.D.   On: 07/07/2020 16:33   NM HEPATOBILIARY LEAK (POST-SURGICAL)  Result Date: 07/07/2020 CLINICAL DATA:  Evaluate for bile leak status post cholecystectomy. EXAM: NUCLEAR MEDICINE HEPATOBILIARY IMAGING TECHNIQUE: Sequential images of the abdomen were obtained out to 60 minutes following intravenous administration of radiopharmaceutical.  RADIOPHARMACEUTICALS:  5.4 mCi Tc-37m Choletec IV COMPARISON:  CT AP 07/06/2020 FINDINGS: Prompt uptake and biliary excretion of activity by the liver is seen. Biliary activity passes into small bowel, consistent with patent common bile duct. No abnormal radiotracer accumulation identified to suggest bile leak. IMPRESSION: 1. No signs of bile leak. 2. Patent common bile duct with normal biliary to bowel transit. Electronically Signed   By: TKerby MoorsM.D.   On: 07/07/2020 13:22   IR Radiologist Eval & Mgmt  Result Date: 08/05/2020 Please refer to notes tab for details about interventional procedure. (Op Note)   Labs:  CBC: Recent Labs    07/08/20 0128 07/13/20 0933 07/14/20 0303 07/15/20 0425  WBC 8.8 7.1 7.0 6.8  HGB 10.4* 11.5* 11.1* 10.9*  HCT 33.3* 37.3 35.3* 35.0*  PLT 376 476* 442* 423*    COAGS: Recent Labs    07/07/20 1336  INR 1.1    BMP: Recent Labs    07/08/20 0128 07/13/20 0430 07/13/20 0933 07/14/20 0303 07/15/20 0425  NA 135  --  139 137 137  K 3.9  --  4.5 4.2 4.1  CL 102  --  107 108 108  CO2 24  --  _0 GLUCOSE 151*  --  174* 232* 190*  BUN 7*  --  5* 7* 10  CALCIUM 8.7*  --  9.4 9.5 9.2  CREATININE 0.78 0.78 0.85 0.73 0.82  GFRNONAA >60 >60 >60 >60 >60    LIVER FUNCTION TESTS: Recent Labs    07/06/20 1805 07/07/20 0238 07/08/20 0128 07/13/20 0933  BILITOT 0.9 0.7 0.5 0.7  AST 16 13* 14* 18  ALT _1 ALKPHOS 81 70 61 61  PROT 6.5 5.3* 5.1* 6.4*  ALBUMIN 2.9* 2.4* 2.3* 2.9*    TUMOR MARKERS: No results for input(s): AFPTM, CEA, CA199, CHROMGRNA in the last 8760 hours.  Assessment and Plan:  Drain output is scant and clear likely representing drainage of injected flush volume.  She is clinically asymptomatic.  CT scan earlier today demonstrated resolution of gallbladder fossa abscess.  The drainage catheter was removed.  No further scheduled follow-up.  Electronically Signed: HCriselda Peaches6/10/2020, 1:07  PM   I spent a total of  10 Minutes in face to face in clinical consultation, greater than 50% of which was counseling/coordinating care for abscess drain in place.

## 2020-08-06 DIAGNOSIS — Z48815 Encounter for surgical aftercare following surgery on the digestive system: Secondary | ICD-10-CM | POA: Diagnosis not present

## 2020-08-06 DIAGNOSIS — E7800 Pure hypercholesterolemia, unspecified: Secondary | ICD-10-CM | POA: Diagnosis not present

## 2020-08-06 DIAGNOSIS — K651 Peritoneal abscess: Secondary | ICD-10-CM | POA: Diagnosis not present

## 2020-08-06 DIAGNOSIS — B9689 Other specified bacterial agents as the cause of diseases classified elsewhere: Secondary | ICD-10-CM | POA: Diagnosis not present

## 2020-08-06 DIAGNOSIS — E1165 Type 2 diabetes mellitus with hyperglycemia: Secondary | ICD-10-CM | POA: Diagnosis not present

## 2020-08-06 DIAGNOSIS — Z4801 Encounter for change or removal of surgical wound dressing: Secondary | ICD-10-CM | POA: Diagnosis not present

## 2020-08-06 DIAGNOSIS — N189 Chronic kidney disease, unspecified: Secondary | ICD-10-CM | POA: Diagnosis not present

## 2020-08-06 DIAGNOSIS — K8000 Calculus of gallbladder with acute cholecystitis without obstruction: Secondary | ICD-10-CM | POA: Diagnosis not present

## 2020-08-06 DIAGNOSIS — I129 Hypertensive chronic kidney disease with stage 1 through stage 4 chronic kidney disease, or unspecified chronic kidney disease: Secondary | ICD-10-CM | POA: Diagnosis not present

## 2020-08-07 LAB — CULTURE, BLOOD (ROUTINE X 2): Special Requests: ADEQUATE

## 2020-08-10 DIAGNOSIS — E1165 Type 2 diabetes mellitus with hyperglycemia: Secondary | ICD-10-CM | POA: Diagnosis not present

## 2020-08-10 DIAGNOSIS — E1169 Type 2 diabetes mellitus with other specified complication: Secondary | ICD-10-CM | POA: Diagnosis not present

## 2020-08-10 DIAGNOSIS — Z4801 Encounter for change or removal of surgical wound dressing: Secondary | ICD-10-CM | POA: Diagnosis not present

## 2020-08-10 DIAGNOSIS — K8000 Calculus of gallbladder with acute cholecystitis without obstruction: Secondary | ICD-10-CM | POA: Diagnosis not present

## 2020-08-10 DIAGNOSIS — B9689 Other specified bacterial agents as the cause of diseases classified elsewhere: Secondary | ICD-10-CM | POA: Diagnosis not present

## 2020-08-10 DIAGNOSIS — S40861A Insect bite (nonvenomous) of right upper arm, initial encounter: Secondary | ICD-10-CM | POA: Diagnosis not present

## 2020-08-10 DIAGNOSIS — E7800 Pure hypercholesterolemia, unspecified: Secondary | ICD-10-CM | POA: Diagnosis not present

## 2020-08-10 DIAGNOSIS — K82A1 Gangrene of gallbladder in cholecystitis: Secondary | ICD-10-CM | POA: Diagnosis not present

## 2020-08-10 DIAGNOSIS — I10 Essential (primary) hypertension: Secondary | ICD-10-CM | POA: Diagnosis not present

## 2020-08-10 DIAGNOSIS — B373 Candidiasis of vulva and vagina: Secondary | ICD-10-CM | POA: Diagnosis not present

## 2020-08-10 DIAGNOSIS — K651 Peritoneal abscess: Secondary | ICD-10-CM | POA: Diagnosis not present

## 2020-08-10 DIAGNOSIS — N189 Chronic kidney disease, unspecified: Secondary | ICD-10-CM | POA: Diagnosis not present

## 2020-08-10 DIAGNOSIS — Z48815 Encounter for surgical aftercare following surgery on the digestive system: Secondary | ICD-10-CM | POA: Diagnosis not present

## 2020-08-10 DIAGNOSIS — E782 Mixed hyperlipidemia: Secondary | ICD-10-CM | POA: Diagnosis not present

## 2020-08-10 DIAGNOSIS — I129 Hypertensive chronic kidney disease with stage 1 through stage 4 chronic kidney disease, or unspecified chronic kidney disease: Secondary | ICD-10-CM | POA: Diagnosis not present

## 2020-08-17 DIAGNOSIS — Z4801 Encounter for change or removal of surgical wound dressing: Secondary | ICD-10-CM | POA: Diagnosis not present

## 2020-08-17 DIAGNOSIS — E1165 Type 2 diabetes mellitus with hyperglycemia: Secondary | ICD-10-CM | POA: Diagnosis not present

## 2020-08-17 DIAGNOSIS — N189 Chronic kidney disease, unspecified: Secondary | ICD-10-CM | POA: Diagnosis not present

## 2020-08-17 DIAGNOSIS — Z48815 Encounter for surgical aftercare following surgery on the digestive system: Secondary | ICD-10-CM | POA: Diagnosis not present

## 2020-08-17 DIAGNOSIS — K8000 Calculus of gallbladder with acute cholecystitis without obstruction: Secondary | ICD-10-CM | POA: Diagnosis not present

## 2020-08-17 DIAGNOSIS — B9689 Other specified bacterial agents as the cause of diseases classified elsewhere: Secondary | ICD-10-CM | POA: Diagnosis not present

## 2020-08-17 DIAGNOSIS — K651 Peritoneal abscess: Secondary | ICD-10-CM | POA: Diagnosis not present

## 2020-08-17 DIAGNOSIS — E7800 Pure hypercholesterolemia, unspecified: Secondary | ICD-10-CM | POA: Diagnosis not present

## 2020-08-17 DIAGNOSIS — I129 Hypertensive chronic kidney disease with stage 1 through stage 4 chronic kidney disease, or unspecified chronic kidney disease: Secondary | ICD-10-CM | POA: Diagnosis not present

## 2020-08-18 DIAGNOSIS — Z4801 Encounter for change or removal of surgical wound dressing: Secondary | ICD-10-CM | POA: Diagnosis not present

## 2020-08-18 DIAGNOSIS — E7800 Pure hypercholesterolemia, unspecified: Secondary | ICD-10-CM | POA: Diagnosis not present

## 2020-08-18 DIAGNOSIS — E1165 Type 2 diabetes mellitus with hyperglycemia: Secondary | ICD-10-CM | POA: Diagnosis not present

## 2020-08-18 DIAGNOSIS — B9689 Other specified bacterial agents as the cause of diseases classified elsewhere: Secondary | ICD-10-CM | POA: Diagnosis not present

## 2020-08-18 DIAGNOSIS — K651 Peritoneal abscess: Secondary | ICD-10-CM | POA: Diagnosis not present

## 2020-08-18 DIAGNOSIS — N189 Chronic kidney disease, unspecified: Secondary | ICD-10-CM | POA: Diagnosis not present

## 2020-08-18 DIAGNOSIS — I129 Hypertensive chronic kidney disease with stage 1 through stage 4 chronic kidney disease, or unspecified chronic kidney disease: Secondary | ICD-10-CM | POA: Diagnosis not present

## 2020-08-18 DIAGNOSIS — Z48815 Encounter for surgical aftercare following surgery on the digestive system: Secondary | ICD-10-CM | POA: Diagnosis not present

## 2020-08-18 DIAGNOSIS — K8000 Calculus of gallbladder with acute cholecystitis without obstruction: Secondary | ICD-10-CM | POA: Diagnosis not present

## 2020-08-25 DIAGNOSIS — I129 Hypertensive chronic kidney disease with stage 1 through stage 4 chronic kidney disease, or unspecified chronic kidney disease: Secondary | ICD-10-CM | POA: Diagnosis not present

## 2020-08-25 DIAGNOSIS — N189 Chronic kidney disease, unspecified: Secondary | ICD-10-CM | POA: Diagnosis not present

## 2020-08-25 DIAGNOSIS — E7800 Pure hypercholesterolemia, unspecified: Secondary | ICD-10-CM | POA: Diagnosis not present

## 2020-08-25 DIAGNOSIS — B9689 Other specified bacterial agents as the cause of diseases classified elsewhere: Secondary | ICD-10-CM | POA: Diagnosis not present

## 2020-08-25 DIAGNOSIS — K8000 Calculus of gallbladder with acute cholecystitis without obstruction: Secondary | ICD-10-CM | POA: Diagnosis not present

## 2020-08-25 DIAGNOSIS — K661 Hemoperitoneum: Secondary | ICD-10-CM | POA: Diagnosis not present

## 2020-08-25 DIAGNOSIS — Z48815 Encounter for surgical aftercare following surgery on the digestive system: Secondary | ICD-10-CM | POA: Diagnosis not present

## 2020-08-25 DIAGNOSIS — E1165 Type 2 diabetes mellitus with hyperglycemia: Secondary | ICD-10-CM | POA: Diagnosis not present

## 2020-08-25 DIAGNOSIS — Z4801 Encounter for change or removal of surgical wound dressing: Secondary | ICD-10-CM | POA: Diagnosis not present

## 2020-08-26 DIAGNOSIS — Z794 Long term (current) use of insulin: Secondary | ICD-10-CM | POA: Diagnosis not present

## 2020-08-26 DIAGNOSIS — E783 Hyperchylomicronemia: Secondary | ICD-10-CM | POA: Diagnosis not present

## 2020-08-26 DIAGNOSIS — H8309 Labyrinthitis, unspecified ear: Secondary | ICD-10-CM | POA: Diagnosis not present

## 2020-08-26 DIAGNOSIS — E1100 Type 2 diabetes mellitus with hyperosmolarity without nonketotic hyperglycemic-hyperosmolar coma (NKHHC): Secondary | ICD-10-CM | POA: Diagnosis not present

## 2020-08-26 DIAGNOSIS — I10 Essential (primary) hypertension: Secondary | ICD-10-CM | POA: Diagnosis not present

## 2020-09-09 DIAGNOSIS — Z4801 Encounter for change or removal of surgical wound dressing: Secondary | ICD-10-CM | POA: Diagnosis not present

## 2020-09-09 DIAGNOSIS — Z794 Long term (current) use of insulin: Secondary | ICD-10-CM | POA: Diagnosis not present

## 2020-09-09 DIAGNOSIS — N189 Chronic kidney disease, unspecified: Secondary | ICD-10-CM | POA: Diagnosis not present

## 2020-09-09 DIAGNOSIS — E7800 Pure hypercholesterolemia, unspecified: Secondary | ICD-10-CM | POA: Diagnosis not present

## 2020-09-09 DIAGNOSIS — E1169 Type 2 diabetes mellitus with other specified complication: Secondary | ICD-10-CM | POA: Diagnosis not present

## 2020-09-09 DIAGNOSIS — I129 Hypertensive chronic kidney disease with stage 1 through stage 4 chronic kidney disease, or unspecified chronic kidney disease: Secondary | ICD-10-CM | POA: Diagnosis not present

## 2020-09-09 DIAGNOSIS — K8000 Calculus of gallbladder with acute cholecystitis without obstruction: Secondary | ICD-10-CM | POA: Diagnosis not present

## 2020-09-09 DIAGNOSIS — K651 Peritoneal abscess: Secondary | ICD-10-CM | POA: Diagnosis not present

## 2020-09-09 DIAGNOSIS — I10 Essential (primary) hypertension: Secondary | ICD-10-CM | POA: Diagnosis not present

## 2020-09-09 DIAGNOSIS — B9689 Other specified bacterial agents as the cause of diseases classified elsewhere: Secondary | ICD-10-CM | POA: Diagnosis not present

## 2020-09-09 DIAGNOSIS — Z48815 Encounter for surgical aftercare following surgery on the digestive system: Secondary | ICD-10-CM | POA: Diagnosis not present

## 2020-09-09 DIAGNOSIS — E1165 Type 2 diabetes mellitus with hyperglycemia: Secondary | ICD-10-CM | POA: Diagnosis not present

## 2020-09-14 ENCOUNTER — Ambulatory Visit: Payer: Medicare HMO | Admitting: Endocrinology

## 2020-09-22 NOTE — Progress Notes (Signed)
Patient ID: Sandra Washington, female   DOB: Jul 28, 1940, 80 y.o.   MRN: [id number]   Reason for Appointment: follow-up   History of Present Illness   Diagnosis: Type 2 DIABETES MELITUS, date of diagnosis:   1995       She has been on insulin almost since her diagnosis and has had various regimens over the years More recently has been on basal bolus insulin with Lantus and Humalog with variable control, depending on her compliance with diet and exercise She was subsequently changed to NPH and regular insulin because of cost  Recent history:  Insulin regimen: Novolin N insulin 8 units a.m.-- 12 units at bedtime,  Novolin R 10-05-14 ac tid   Oral hypoglycemic drugs:none     Side effects from medications:  Diarrhea from Metformin  Her A1c is now 6.2 and progressively improved   Current blood sugar patterns, management and problems: She has lost a significant amount of weight with her cholecystitis in May  Although her blood sugars look fairly good at home she is monitoring mostly in the mornings only before breakfast  She was told to reduce her NPH by 2 units twice daily but she did not  Also unable to verify what her blood sugars are after dinner or any other meals She is still taking the same dose of Novolin R at each meal without any adjustments based on meal size Overall likely has had lower intake with her weight loss No hypoglycemic symptoms including overnight likely  She is trying to walk indoors     Monitors blood glucose: Once a day or less .    Glucometer:  Accu-Chek  Blood Glucose readings    PRE-MEAL Fasting Lunch Dinner Bedtime Overall  Glucose range: 82-142      Mean/median: 94    117   POST-MEAL PC Breakfast PC Lunch PC Dinner  Glucose range:   128  Mean/median:      Previously:  PRE-MEAL Fasting Lunch Dinner Bedtime Overall  Glucose range: 80-110      Mean/median:     ?   POST-MEAL PC Breakfast PC Lunch PC Dinner  Glucose range:   110-120 130  Mean/median:              Meals: 3 meals per day.  breakfast is usually at 6-7 AM: Cereal or egg/toast.  Lunch  12:30pm             Dietician visit: Most recent: 03/2008           Wt Readings from Last 3 Encounters:  09/23/20 181 lb (82.1 kg)  07/14/20 185 lb 6.5 oz (84.1 kg)  06/29/20 192 lb (87.1 kg)    Lab Results  Component Value Date   HGBA1C 6.2 (A) 09/23/2020   HGBA1C 6.4 (A) 05/10/2020   HGBA1C 6.9 (H) 01/08/2020   Lab Results  Component Value Date   MICROALBUR <0.7 04/21/2019   LDLCALC 72 01/08/2020   CREATININE 0.82 07/15/2020    OTHER problems discussed today: See review of systems   Office Visit on 09/23/2020  Component Date Value Ref Range Status   Hemoglobin A1C 09/23/2020 6.2 (A) 4.0 - 5.6 % Final    Allergies as of 09/23/2020   No Known Allergies      Medication List        Accurate as of September 23, 2020  8:48 AM. If you have any questions, ask your nurse or doctor.  Accu-Chek Guide Me w/Device Kit 1 each by Does not apply route 2 (two) times daily. Use accu chek guide me device to check blood sugar twice daily.DX:E11.65   Accu-Chek Guide test strip Generic drug: glucose blood 1 each by Other route 2 (two) times daily. Use as instructed   Accu-Chek Softclix Lancets lancets Use bid   amLODipine 5 MG tablet Commonly known as: NORVASC Take 5 mg by mouth 2 (two) times daily.   atorvastatin 20 MG tablet Commonly known as: LIPITOR Take 1 tablet (20 mg total) by mouth daily.   insulin NPH Human 100 UNIT/ML injection Commonly known as: NovoLIN N ReliOn 8 U in am and 12 U hs What changed:  how much to take how to take this when to take this additional instructions   insulin regular 100 units/mL injection Commonly known as: NovoLIN R ReliOn INJECT 8 UNITS SUBCUTANEOUSLY WITH BREAKFAST AND LUNCH, AND 16 UNITS WITH DINNER What changed:  how much to take how to take this when to take this additional  instructions   meclizine 25 MG tablet Commonly known as: ANTIVERT Take 25 mg by mouth 3 (three) times daily as needed for dizziness.   niacin 500 MG tablet Commonly known as: SLO-NIACIN Take 500 mg by mouth at bedtime.   omeprazole 40 MG capsule Commonly known as: PRILOSEC Take 40 mg by mouth daily.   ondansetron 4 MG tablet Commonly known as: ZOFRAN Take 1 tablet (4 mg total) by mouth every 6 (six) hours as needed for nausea.   oxyCODONE 5 MG immediate release tablet Commonly known as: Roxicodone Take 1 tablet (5 mg total) by mouth every 4 (four) hours as needed for severe pain or breakthrough pain.   Vitamin D3 125 MCG (5000 UT) Caps Take 5,000 Units by mouth daily.        Allergies: No Known Allergies  Past Medical History:  Diagnosis Date   Chronic kidney disease    kidney stone   Diabetes mellitus without complication ([place])    Hypertension    Stroke [facility])     Past Surgical History:  Procedure Laterality Date   BREAST BIOPSY Left    CATARACT EXTRACTION Bilateral 2018   Dr. Tommy Rainwater   CHOLECYSTECTOMY N/A 07/01/2020   Procedure: LAPAROSCOPIC CHOLECYSTECTOMY;  Surgeon: Virl Cagey, MD;  Location: [facility];  Service: General;  Laterality: N/A;   HAND SURGERY     IR RADIOLOGIST EVAL & MGMT  08/05/2020   NO PAST SURGERIES      No family history on file.  Social History:  reports that she has quit smoking. She has never used smokeless tobacco. She reports that she does not drink alcohol and does not use drugs.  Review of Systems:   HYPERTENSION:  she was on valsartan HCTZ and amlodipine Followed by PCP about 2 months ago  Unclear whether she is taking valsartan or not, this was possibly discontinued when she was discharged from the hospital in May Blood pressure appears to be higher today on second measurement  BP Readings from Last 3 Encounters:  09/23/20 (!) 150/60  07/15/20 (!) 132/52  07/03/20 (!) 117/50    HYPERLIPIDEMIA:  Taking 20 mg  atorvastatin and niacin 500 mg, Niacin has been prescribed by her PCP Last labs in April 2022 showed LDL 86  Risk factors: May have had a history of stroke  Lab Results  Component Value Date   CHOL 138 01/08/2020   CHOL 142 04/21/2019   CHOL 158 12/13/2018  Lab Results  Component Value Date   HDL 56.50 01/08/2020   HDL 52.20 04/21/2019   HDL 39.70 12/13/2018   Lab Results  Component Value Date   LDLCALC 72 01/08/2020   LDLCALC 78 04/21/2019   LDLCALC 100 (H) 12/13/2018   Lab Results  Component Value Date   TRIG 48.0 01/08/2020   TRIG 57.0 04/21/2019   TRIG 93.0 12/13/2018   Lab Results  Component Value Date   CHOLHDL 2 01/08/2020   CHOLHDL 3 04/21/2019   CHOLHDL 4 12/13/2018   No results found for: LDLDIRECT  Last diabetic foot exam in 7/22  No symptoms of numbness or tingling in the feet  She had diabetic eye exam in 6/22 with only a few microaneurysms present   Examination:   BP (!) 150/60 (BP Location: Left Arm)   Pulse 64   Ht '5\' 6"'  (1.676 m)   Wt 181 lb (82.1 kg)   SpO2 99%   BMI 29.21 kg/m   Body mass index is 29.21 kg/m.   Diabetic Foot Exam - Simple   Simple Foot Form Diabetic Foot exam was performed with the following findings: Yes   Visual Inspection No deformities, no ulcerations, no other skin breakdown bilaterally: Yes Sensation Testing Intact to touch and monofilament testing bilaterally: Yes Pulse Check Posterior Tibialis and Dorsalis pulse intact bilaterally: Yes Comments     No ankle edema present   ASSESSMENT/ PLAN:   Diabetes type 2, nonobese on insulin  See history of present illness for detailed discussion of  current management, blood sugar patterns and problems identified  She is on insulin using basal bolus generic NPH and regular preparations  A1c is excellent at 6.2  She is continuing the same dose of insulin even though she will was told to cut back on NPH insulin both morning and evening on the last 2  visits Her morning sugars are as low as 82 and she may possibly have lower readings overnight now asymptomatic Unable to identify her blood sugar patterns with no postprandial monitoring She likely will benefit from using freestyle libre if this is affordable  Recommendations: Reduce NPH by 2 units at night and take only 10 units Reminded her to follow-up the written instructions today Discussed the need for checking blood sugars several times a day especially after meals Explained to her how the freestyle [place] is utilized and this will give [place] much more information than fingersticks including overnight patterns She will be calling the DME suppliers to set up an account and given her the list of phone numbers to call  Reduce morning regular insulin to 6 units since she usually has some protein in the morning  Check urine microalbumin  HYPERTENSION: She is on amlodipine but not valsartan HCT Blood pressure is higher than usual today, systolic blood pressure sound is faint She will discuss restarting at least a lower dose of valsartan with her PCP at upcoming visit  Hyperlipidemia: We will recheck labs today  Patient Instructions  REDUCE N insulin at bedtime to 10 units  Reduce R insulin in am to 6 units  Check on valsartan-hydrochlorothiazide 160-12.5 MG tablet ?     Elayne Snare 09/23/2020, 8:48 AM   Note: This office note was prepared with Dragon voice recognition system technology. Any transcriptional errors that result from this process are unintentional.

## 2020-09-23 ENCOUNTER — Ambulatory Visit (INDEPENDENT_AMBULATORY_CARE_PROVIDER_SITE_OTHER): Payer: Medicare HMO | Admitting: Endocrinology

## 2020-09-23 ENCOUNTER — Encounter: Payer: Self-pay | Admitting: Endocrinology

## 2020-09-23 ENCOUNTER — Telehealth: Payer: Self-pay | Admitting: Endocrinology

## 2020-09-23 ENCOUNTER — Other Ambulatory Visit: Payer: Self-pay

## 2020-09-23 VITALS — BP 150/60 | HR 64 | Ht 66.0 in | Wt 181.0 lb

## 2020-09-23 DIAGNOSIS — E1165 Type 2 diabetes mellitus with hyperglycemia: Secondary | ICD-10-CM

## 2020-09-23 DIAGNOSIS — E7800 Pure hypercholesterolemia, unspecified: Secondary | ICD-10-CM

## 2020-09-23 DIAGNOSIS — Z794 Long term (current) use of insulin: Secondary | ICD-10-CM | POA: Diagnosis not present

## 2020-09-23 DIAGNOSIS — I10 Essential (primary) hypertension: Secondary | ICD-10-CM

## 2020-09-23 LAB — LIPID PANEL
Cholesterol: 137 mg/dL (ref 0–200)
HDL: 50.5 mg/dL (ref >39.00)
LDL Cholesterol: 75 mg/dL (ref 0–99)
NonHDL: 86.13
Total CHOL/HDL Ratio: 3
Triglycerides: 55 mg/dL (ref 0.0–149.0)
VLDL: 11 mg/dL (ref 0.0–40.0)

## 2020-09-23 LAB — POCT GLYCOSYLATED HEMOGLOBIN (HGB A1C): Hemoglobin A1C: 6.2 % — AB (ref 4.0–5.6)

## 2020-09-23 LAB — COMPREHENSIVE METABOLIC PANEL
ALT: 14 U/L (ref 0–35)
AST: 17 U/L (ref 0–37)
Albumin: 4 g/dL (ref 3.5–5.2)
Alkaline Phosphatase: 70 U/L (ref 39–117)
BUN: 10 mg/dL (ref 6–23)
CO2: 28 mEq/L (ref 19–32)
Calcium: 9.9 mg/dL (ref 8.4–10.5)
Chloride: 106 mEq/L (ref 96–112)
Creatinine, Ser: 0.68 mg/dL (ref 0.40–1.20)
GFR: 81.52 mL/min (ref >60.00)
Glucose, Bld: 121 mg/dL — ABNORMAL HIGH (ref 70–99)
Potassium: 4.1 mEq/L (ref 3.5–5.1)
Sodium: 143 mEq/L (ref 135–145)
Total Bilirubin: 0.7 mg/dL (ref 0.2–1.2)
Total Protein: 6.8 g/dL (ref 6.0–8.3)

## 2020-09-23 NOTE — Progress Notes (Signed)
Please call to let patient know that the lab results are normal and no further action needed

## 2020-09-23 NOTE — Patient Instructions (Addendum)
REDUCE N insulin at bedtime to 10 units  Reduce R insulin in am to 6 units  Check on valsartan-hydrochlorothiazide 160-12.5 MG tablet ?

## 2020-09-23 NOTE — Telephone Encounter (Signed)
Pt called after her appt today and is a little confused at when she should be taking her medication. She states she would like it "Spelled all out for her clearly" when she should be taking her Novolin in the morning, lunch, and dinner.   [phone]

## 2020-09-24 NOTE — Telephone Encounter (Signed)
Called to give change as follows REDUCE N insulin at bedtime to 10 units and Reduce R insulin in am to 6 units all other times stay the same

## 2020-09-24 NOTE — Telephone Encounter (Signed)
Called patient back but no answer. Left voicemail to call us back

## 2020-09-24 NOTE — Telephone Encounter (Signed)
Patient wants to know if the changes you put on AVS for N and R insulin are just for those times and she keeps the other times at the original units OR if she is only taking these two at the times you told her?

## 2020-10-25 DIAGNOSIS — R197 Diarrhea, unspecified: Secondary | ICD-10-CM | POA: Diagnosis not present

## 2020-10-25 DIAGNOSIS — H8309 Labyrinthitis, unspecified ear: Secondary | ICD-10-CM | POA: Diagnosis not present

## 2020-10-25 DIAGNOSIS — E1169 Type 2 diabetes mellitus with other specified complication: Secondary | ICD-10-CM | POA: Diagnosis not present

## 2020-10-25 DIAGNOSIS — K82A1 Gangrene of gallbladder in cholecystitis: Secondary | ICD-10-CM | POA: Diagnosis not present

## 2020-11-26 DIAGNOSIS — Z794 Long term (current) use of insulin: Secondary | ICD-10-CM | POA: Diagnosis not present

## 2020-11-26 DIAGNOSIS — I10 Essential (primary) hypertension: Secondary | ICD-10-CM | POA: Diagnosis not present

## 2020-11-26 DIAGNOSIS — E783 Hyperchylomicronemia: Secondary | ICD-10-CM | POA: Diagnosis not present

## 2020-11-26 DIAGNOSIS — E1100 Type 2 diabetes mellitus with hyperosmolarity without nonketotic hyperglycemic-hyperosmolar coma (NKHHC): Secondary | ICD-10-CM | POA: Diagnosis not present

## 2020-12-24 ENCOUNTER — Ambulatory Visit: Payer: Medicare HMO | Admitting: Endocrinology

## 2020-12-27 DIAGNOSIS — Z794 Long term (current) use of insulin: Secondary | ICD-10-CM | POA: Diagnosis not present

## 2020-12-27 DIAGNOSIS — E782 Mixed hyperlipidemia: Secondary | ICD-10-CM | POA: Diagnosis not present

## 2020-12-27 DIAGNOSIS — E1100 Type 2 diabetes mellitus with hyperosmolarity without nonketotic hyperglycemic-hyperosmolar coma (NKHHC): Secondary | ICD-10-CM | POA: Diagnosis not present

## 2020-12-27 DIAGNOSIS — E1169 Type 2 diabetes mellitus with other specified complication: Secondary | ICD-10-CM | POA: Diagnosis not present

## 2020-12-27 DIAGNOSIS — Z7984 Long term (current) use of oral hypoglycemic drugs: Secondary | ICD-10-CM | POA: Diagnosis not present

## 2020-12-27 DIAGNOSIS — I10 Essential (primary) hypertension: Secondary | ICD-10-CM | POA: Diagnosis not present

## 2020-12-27 DIAGNOSIS — E7800 Pure hypercholesterolemia, unspecified: Secondary | ICD-10-CM | POA: Diagnosis not present

## 2021-01-05 ENCOUNTER — Ambulatory Visit: Payer: Medicare HMO | Admitting: Endocrinology

## 2021-01-05 ENCOUNTER — Encounter: Payer: Self-pay | Admitting: Endocrinology

## 2021-01-05 ENCOUNTER — Other Ambulatory Visit: Payer: Self-pay

## 2021-01-05 VITALS — BP 130/60 | HR 68 | Ht 66.0 in | Wt 183.0 lb

## 2021-01-05 DIAGNOSIS — I10 Essential (primary) hypertension: Secondary | ICD-10-CM | POA: Diagnosis not present

## 2021-01-05 DIAGNOSIS — Z794 Long term (current) use of insulin: Secondary | ICD-10-CM

## 2021-01-05 DIAGNOSIS — E1165 Type 2 diabetes mellitus with hyperglycemia: Secondary | ICD-10-CM | POA: Diagnosis not present

## 2021-01-05 NOTE — Progress Notes (Signed)
Patient ID: Sandra Washington, female   DOB: Mar 21, 1940, 80 y.o.   MRN: [id number]   Reason for Appointment: follow-up   History of Present Illness   Diagnosis: Type 2 DIABETES MELITUS, date of diagnosis:   1995       She has been on insulin almost since her diagnosis and has had various regimens over the years More recently has been on basal bolus insulin with Lantus and Humalog with variable control, depending on her compliance with diet and exercise She was subsequently changed to NPH and regular insulin because of cost  Recent history:  Insulin regimen: Novolin N insulin 8 units a.m.-- 10 units at bedtime,  Novolin R 10-05-14 ac tid   Oral hypoglycemic drugs:none     Side effects from medications:  Diarrhea from Metformin  Her A1c is now 6.8 and relatively higher   Current blood sugar patterns, management and problems: She has checked blood sugars very sporadically at home and mostly in the mornings However is generally having some tendency to low sugars either in the morning or at bedtime  Usually checking blood sugars in the evenings only when she feels hypoglycemic and this occurred once after supper  Also has a blood sugar of 207 possibly soon after eating in the evening  Blood sugars may be slightly higher in the afternoon or midday when checked  She is reporting taking the doses of insulin as prescribed on the last visit although she cannot remember without looking at her written information She does feel hypoglycemic and blood sugars are below normal on her meter  Not clear if her test strips are expired  Again not taking any non-insulin hypoglycemic drugs  She is trying to walk indoors but no formal exercise   Monitors blood glucose: Once a day or less .    Glucometer:  Accu-Chek  Blood Glucose readings    PRE-MEAL Fasting Lunch Dinner Bedtime Overall  Glucose range: 58-139    44-207  Mean/median: 93    97   POST-MEAL PC Breakfast PC Lunch PC  Dinner  Glucose range:  160 207  Mean/median:      Previously:   PRE-MEAL Fasting Lunch Dinner Bedtime Overall  Glucose range: 82-142      Mean/median: 94    117   POST-MEAL PC Breakfast PC Lunch PC Dinner  Glucose range:   128  Mean/median:      Previously:  PRE-MEAL Fasting Lunch Dinner Bedtime Overall  Glucose range: 80-110      Mean/median:     ?   POST-MEAL PC Breakfast PC Lunch PC Dinner  Glucose range:  110-120 130  Mean/median:              Meals: 3 meals per day.  breakfast is usually at 6-7 AM: Cereal or egg/toast.  Lunch  12:30pm             Dietician visit: Most recent: 03/2008           Wt Readings from Last 3 Encounters:  01/05/21 183 lb (83 kg)  09/23/20 181 lb (82.1 kg)  07/14/20 185 lb 6.5 oz (84.1 kg)    Lab Results  Component Value Date   HGBA1C 6.2 (A) 09/23/2020   HGBA1C 6.4 (A) 05/10/2020   HGBA1C 6.9 (H) 01/08/2020   Lab Results  Component Value Date   MICROALBUR <0.7 04/21/2019   [place] 75 09/23/2020   CREATININE 0.68 09/23/2020    OTHER problems discussed today: See review  of systems   No visits with results within 1 Week(s) from this visit.  Latest known visit with results is:  Office Visit on 09/23/2020  Component Date Value Ref Range Status   Hemoglobin A1C 09/23/2020 6.2 (A)  4.0 - 5.6 % Final   Cholesterol 09/23/2020 137  0 - 200 mg/dL Final   ATP III Classification       Desirable:  < 200 mg/dL               Borderline High:  200 - 239 mg/dL          High:  > = 240 mg/dL   Triglycerides 09/23/2020 55.0  0.0 - 149.0 mg/dL Final   Normal:  <150 mg/dLBorderline High:  150 - 199 mg/dL   HDL 09/23/2020 50.50  >39.00 mg/dL Final   VLDL 09/23/2020 11.0  0.0 - 40.0 mg/dL Final   LDL Cholesterol 09/23/2020 75  0 - 99 mg/dL Final   Total CHOL/HDL Ratio 09/23/2020 3   Final                  Men          Women1/2 Average Risk     3.4          3.3Average Risk          5.0          4.42X Average Risk          9.6          7.13X  Average Risk          15.0          11.0                       NonHDL 09/23/2020 86.13   Final   NOTE:  Non-HDL goal should be 30 mg/dL higher than patient's LDL goal (i.e. LDL goal of < 70 mg/dL, would have non-HDL goal of < 100 mg/dL)   Sodium 09/23/2020 143  135 - 145 mEq/L Final   Potassium 09/23/2020 4.1  3.5 - 5.1 mEq/L Final   Chloride 09/23/2020 106  96 - 112 mEq/L Final   CO2 09/23/2020 28  19 - 32 mEq/L Final   Glucose, Bld 09/23/2020 121 (A)  70 - 99 mg/dL Final   BUN 09/23/2020 10  6 - 23 mg/dL Final   Creatinine, Ser 09/23/2020 0.68  0.40 - 1.20 mg/dL Final   Total Bilirubin 09/23/2020 0.7  0.2 - 1.2 mg/dL Final   Alkaline Phosphatase 09/23/2020 70  39 - 117 U/L Final   AST 09/23/2020 17  0 - 37 U/L Final   ALT 09/23/2020 14  0 - 35 U/L Final   Total Protein 09/23/2020 6.8  6.0 - 8.3 g/dL Final   Albumin 09/23/2020 4.0  3.5 - 5.2 g/dL Final   GFR 09/23/2020 81.52  >60.00 mL/min Final   Calculated using the CKD-EPI Creatinine Equation (2021)   Calcium 09/23/2020 9.9  8.4 - 10.5 mg/dL Final    Allergies as of 01/05/2021   No Known Allergies      Medication List        Accurate as of January 05, 2021  4:29 PM. If you have any questions, ask your nurse or doctor.          STOP taking these medications    oxyCODONE 5 MG immediate release tablet Commonly known as: Roxicodone Stopped by: Elayne Snare, MD  TAKE these medications    Accu-Chek Guide Me w/Device Kit 1 each by Does not apply route 2 (two) times daily. Use accu chek guide me device to check blood sugar twice daily.DX:E11.65   Accu-Chek Guide test strip Generic drug: glucose blood 1 each by Other route 2 (two) times daily. Use as instructed   Accu-Chek Softclix Lancets lancets Use bid   amLODipine 5 MG tablet Commonly known as: NORVASC Take 5 mg by mouth 2 (two) times daily.   atorvastatin 20 MG tablet Commonly known as: LIPITOR Take 1 tablet (20 mg total) by mouth daily.   insulin  NPH Human 100 UNIT/ML injection Commonly known as: NovoLIN N ReliOn 8 U in am and 12 U hs What changed:  how much to take how to take this when to take this additional instructions   insulin regular 100 units/mL injection Commonly known as: NovoLIN R ReliOn INJECT 8 UNITS SUBCUTANEOUSLY WITH BREAKFAST AND LUNCH, AND 16 UNITS WITH DINNER What changed:  how much to take how to take this when to take this additional instructions   meclizine 25 MG tablet Commonly known as: ANTIVERT Take 25 mg by mouth 3 (three) times daily as needed for dizziness.   niacin 500 MG tablet Commonly known as: SLO-NIACIN Take 500 mg by mouth at bedtime.   omeprazole 40 MG capsule Commonly known as: PRILOSEC Take 40 mg by mouth daily.   ondansetron 4 MG tablet Commonly known as: ZOFRAN Take 1 tablet (4 mg total) by mouth every 6 (six) hours as needed for nausea.   valsartan-hydrochlorothiazide 160-12.5 MG tablet Commonly known as: DIOVAN-HCT   Vitamin D3 125 MCG (5000 UT) Caps Take 5,000 Units by mouth daily.        Allergies: No Known Allergies  Past Medical History:  Diagnosis Date   Chronic kidney disease    kidney stone   Diabetes mellitus without complication ([place])    Hypertension    Stroke [facility])     Past Surgical History:  Procedure Laterality Date   BREAST BIOPSY Left    CATARACT EXTRACTION Bilateral 2018   Dr. Tommy Rainwater   CHOLECYSTECTOMY N/A 07/01/2020   Procedure: LAPAROSCOPIC CHOLECYSTECTOMY;  Surgeon: Virl Cagey, MD;  Location: [facility];  Service: General;  Laterality: N/A;   HAND SURGERY     IR RADIOLOGIST EVAL & MGMT  08/05/2020   NO PAST SURGERIES      No family history on file.  Social History:  reports that she has quit smoking. She has never used smokeless tobacco. She reports that she does not drink alcohol and does not use drugs.  Review of Systems:   HYPERTENSION:  she is on valsartan HCTZ and amlodipine Followed by PCP regularly  Recent renal  function from PCP also normal  BP Readings from Last 3 Encounters:  01/05/21 130/60  09/23/20 (!) 150/60  07/15/20 (!) 132/52    HYPERLIPIDEMIA:  Taking 20 mg atorvastatin and niacin 500 mg, Niacin has been prescribed by her PCP Last labs in April 2022 showed LDL 86  Risk factors: May have had a history of stroke  Lab Results  Component Value Date   CHOL 137 09/23/2020   CHOL 138 01/08/2020   CHOL 142 04/21/2019   Lab Results  Component Value Date   HDL 50.50 09/23/2020   HDL 56.50 01/08/2020   HDL 52.20 04/21/2019   Lab Results  Component Value Date   LDLCALC 75 09/23/2020   LDLCALC 72 01/08/2020   [place] 78 04/21/2019  Lab Results  Component Value Date   TRIG 55.0 09/23/2020   TRIG 48.0 01/08/2020   TRIG 57.0 04/21/2019   Lab Results  Component Value Date   CHOLHDL 3 09/23/2020   CHOLHDL 2 01/08/2020   CHOLHDL 3 04/21/2019   No results found for: LDLDIRECT  Last diabetic foot exam in 7/22  No symptoms of numbness or tingling in the feet  She had diabetic eye exam in 6/22 with only a few microaneurysms present   Examination:   BP 130/60   Pulse 68   Ht '5\' 6"'  (1.676 m)   Wt 183 lb (83 kg)   SpO2 99%   BMI 29.54 kg/m   Body mass index is 29.54 kg/m.      ASSESSMENT/ PLAN:   Diabetes type 2, nonobese on insulin  See history of present illness for detailed discussion of  current management, blood sugar patterns and problems identified  She is on insulin using basal bolus generic NPH and regular preparations  A1c is 6.8 done by her PCP, previously was at 6.2  She is checking blood sugars only in the mornings lately and difficult to know how to adjust her insulin later in the day She still has not been able to get the freestyle libre system that was recommended in July  Is getting low normal readings in the mornings and possibly low sugars after dinner Again not able to adjust her mealtime dose based on what she is  eating  Recommendations: Reduce NPH by 2 units at night and take 8 units now Continue 8 units in the morning She will empirically reduce her suppertime regular insulin to 12 units Make sure she takes her insulin 30-minute before eating She will call to see which supplier is going to provide her with her freestyle libre and let us know about the prescription She was explained how the freestyle [place] works, application sites, how frequency of monitoring needed and needing to confirm with fingersticks periodically   HYPERTENSION: She is on amlodipine and valsartan HCT Blood pressure is improved  Patient Instructions  REG AT SUPPER WILL  be 12 UNITS INSTEAD OF 16  Bedtime N INSULIN IS 8 UNITS  Check blood sugars on waking up 3 days a week  Also check blood sugars about 2 hours after meals and do this after different meals by rotation  Recommended blood sugar levels on waking up are 90-130 and about 2 hours after meal is 130-180  Please bring your blood sugar monitor to each visit, thank you       Elayne Snare 01/05/2021, 4:29 PM   Note: This office note was prepared with Dragon voice recognition system technology. Any transcriptional errors that result from this process are unintentional.

## 2021-01-05 NOTE — Patient Instructions (Addendum)
REG AT SUPPER WILL  be 12 UNITS INSTEAD OF 16  Bedtime N INSULIN IS 8 UNITS  Check blood sugars on waking up 3 days a week  Also check blood sugars about 2 hours after meals and do this after different meals by rotation  Recommended blood sugar levels on waking up are 90-130 and about 2 hours after meal is 130-180  Please bring your blood sugar monitor to each visit, thank you

## 2021-01-17 ENCOUNTER — Other Ambulatory Visit (HOSPITAL_COMMUNITY): Payer: Self-pay | Admitting: Family Medicine

## 2021-01-17 DIAGNOSIS — Z1231 Encounter for screening mammogram for malignant neoplasm of breast: Secondary | ICD-10-CM

## 2021-01-28 NOTE — Progress Notes (Addendum)
[facility] Note  02/03/2021     CHIEF COMPLAINT Patient presents for Retina Follow Up   HISTORY OF PRESENT ILLNESS: Sandra Washington is a 80 y.o. female who presents to the clinic today for:   HPI     Retina Follow Up   Patient presents with  Diabetic Retinopathy.  In both eyes.  This started years ago.  Severity is moderate.  Duration of 6 months.  Since onset it is rapidly worsening.  I, the attending physician,  performed the HPI with the patient and updated documentation appropriately.        Comments   80 y/o female pt here for 6 mo f/u for mod NPDR w/o DME OU.  VA OD suddenly drastically decreased about a wk ago, as if a "veil" had come over her eye.  Pt began seeing a lot of floaters OD at the time as well.  Pt was under a lot of stress at the time.  BS and BP has been stable.  No FOL reported.  Symptoms have been constant since. Pt called Dr. Jorja Loa, who called her in an Rx for FML TID OD, but it doesn't seem to be having any effect.  No changes reported OS.  BS 77 yesterday.  A1C 1 mo ago was 6.2.       Last edited by Bernarda Caffey, MD on 02/03/2021 11:22 AM.    pt states about a week ago she noticed a decrease in her right eye vision, she states she noticed a bunch of new floaters, she tried to get in to see Dr. Jorja Loa, but was unable to, he gave her a rx of [place] that he has written before for floaters, but it did not help this time, pt denies being on blood thinners or taking aspirin   Referring physician: Madelin Headings, DO [address] [place],  [place] [address]  HISTORICAL INFORMATION:   Selected notes from the MEDICAL RECORD NUMBER Referred by Dr. Madelin Headings for concern of diabetic retinopathy    CURRENT MEDICATIONS: Current Outpatient Medications (Ophthalmic Drugs)  Medication Sig   fluorometholone (FML) 0.1 % ophthalmic suspension    No current facility-administered medications for this visit. (Ophthalmic Drugs)    Current Outpatient Medications (Other)  Medication Sig   ACCU-CHEK SOFTCLIX LANCETS lancets Use bid   amLODipine (NORVASC) 5 MG tablet Take 5 mg by mouth 2 (two) times daily.   atorvastatin (LIPITOR) 20 MG tablet Take 1 tablet (20 mg total) by mouth daily.   Blood Glucose Monitoring Suppl (ACCU-CHEK GUIDE ME) w/Device KIT 1 each by Does not apply route 2 (two) times daily. Use accu chek guide me device to check blood sugar twice daily.DX:E11.65   Cholecalciferol (VITAMIN D3) 5000 UNITS CAPS Take 5,000 Units by mouth daily.   clotrimazole-betamethasone (LOTRISONE) cream    fluconazole (DIFLUCAN) 150 MG tablet    FLUZONE HIGH-DOSE QUADRIVALENT 0.7 ML SUSY    glucose blood (ACCU-CHEK GUIDE) test strip 1 each by Other route 2 (two) times daily. Use as instructed   insulin NPH Human (NOVOLIN N RELION) 100 UNIT/ML injection 8 U in am and 12 U hs (Patient taking differently: Inject 8-12 Units into the skin See admin instructions. Takes 8 units in the morning and 12 units at night)   insulin regular (NOVOLIN R RELION) 100 units/mL injection INJECT 8 UNITS SUBCUTANEOUSLY WITH BREAKFAST AND LUNCH, AND 16 UNITS WITH DINNER (Patient taking differently: Inject 8-16 Units into the skin See admin  instructions. Takes 8 units  with breakfast and lunch, and 16 units with dinner)   meclizine (ANTIVERT) 25 MG tablet Take 25 mg by mouth 3 (three) times daily as needed for dizziness.   MODERNA COVID-19 VACCINE 100 MCG/0.5ML injection    niacin (SLO-NIACIN) 500 MG tablet Take 500 mg by mouth at bedtime.   omeprazole (PRILOSEC) 40 MG capsule Take 40 mg by mouth daily.   ondansetron (ZOFRAN) 4 MG tablet Take 1 tablet (4 mg total) by mouth every 6 (six) hours as needed for nausea.   valsartan-hydrochlorothiazide (DIOVAN-HCT) 160-12.5 MG tablet    omeprazole (PRILOSEC) 40 MG capsule 1 cap(s) orally 20 minutes before breakfast for 90 days (Patient not taking: Reported on 02/03/2021)   No current facility-administered  medications for this visit. (Other)   REVIEW OF SYSTEMS: ROS   Positive for: Neurological, Genitourinary, Endocrine, Eyes Negative for: Constitutional, Gastrointestinal, Skin, Musculoskeletal, HENT, Cardiovascular, Respiratory, Psychiatric, Allergic/Imm, Heme/Lymph Last edited by Matthew Folks, COA on 02/03/2021  7:45 AM.     ALLERGIES No Known Allergies  PAST MEDICAL HISTORY Past Medical History:  Diagnosis Date   Chronic kidney disease    kidney stone   Diabetes mellitus without complication ([place])    Diabetic retinopathy ([place])    Hypertension    Hypertensive retinopathy    Stroke [facility])    Past Surgical History:  Procedure Laterality Date   BREAST BIOPSY Left    CATARACT EXTRACTION Bilateral 2018   Dr. Tommy Rainwater   CHOLECYSTECTOMY N/A 07/01/2020   Procedure: LAPAROSCOPIC CHOLECYSTECTOMY;  Surgeon: Virl Cagey, MD;  Location: [facility];  Service: General;  Laterality: N/A;   EYE SURGERY     HAND SURGERY     IR RADIOLOGIST EVAL & MGMT  08/05/2020   NO PAST SURGERIES      FAMILY HISTORY History reviewed. No pertinent family history.  SOCIAL HISTORY Social History   Tobacco Use   Smoking status: Former   Smokeless tobacco: Never  Substance Use Topics   Alcohol use: No   Drug use: No       OPHTHALMIC EXAM:  Base Eye Exam     Visual Acuity (Snellen - Linear)       Right Left   Dist cc CF @ 1' 20/25   Dist ph cc NI NI    Correction: Glasses         Tonometry (Tonopen, 7:49 AM)       Right Left   Pressure 11 7         Pupils       Dark Light Shape React APD   Right 3 2 Round Brisk None   Left 3 2 Round Brisk None         Visual Fields (Counting fingers)       Left Right    Full    Restrictions  Partial outer superior temporal, inferior temporal deficiencies         Extraocular Movement       Right Left    Full, Ortho Full, Ortho         Neuro/Psych     Oriented x3: Yes   Mood/Affect: Normal         Dilation      Both eyes: 1.0% Mydriacyl, 2.5% Phenylephrine @ 7:49 AM           Slit Lamp and Fundus Exam     Slit Lamp Exam       Right Left   Lids/Lashes Dermatochalasis -  upper lid, Meibomian gland dysfunction Dermatochalasis - upper lid, Meibomian gland dysfunction   Conjunctiva/Sclera nasal and temporal Pinguecula, Melanosis nasal and temporal Pinguecula, Melanosis   Cornea Arcus, trace Punctate epithelial erosions, Well healed temporal cataract wounds Arcus, 1+ Punctate epithelial erosions, Well healed temporal cataract wounds, mild EBMD   Anterior Chamber deep, clear, narrow temporal angle, No cell or flare Deep and quiet   Iris Round and moderately dilated, focal iris atrophy at 1100  --  PI not open, No NVI Round and moderately dilated, small patent PI at 0200, mild bowing , No NVI   Lens PC IOL in good position with open PC PC IOL in good position with open PC   Anterior Vitreous Vitreous syneresis, mild diffuse VH Vitreous syneresis         Fundus Exam       Right Left   Disc Very hazy view, appears perfused Pink and Sharp, Compact   C/D Ratio 0.2 0.2   Macula No view, just red reflex Flat, Blunted foveal reflex with central cystic changes - persistent, scattered Microaneurysms   Vessels mild Vascular attenuation attenuated, mild tortuousity   Periphery Attached, scattered IRH, prominent blot heme inferior to disc Attached, scattered IRH           Refraction     Manifest Refraction       Sphere Cylinder Axis Dist VA   Right -2.25 +2.25 180 CF @ 1'   Left                IMAGING AND PROCEDURES  Imaging and Procedures for '@TODAY' @  OCT, Retina - OU - Both Eyes       Right Eye Findings include (No images obtained today).   Left Eye Quality was good. Central Foveal Thickness: 255. Progression has been stable. Findings include normal foveal contour, no SRF, intraretinal hyper-reflective material, intraretinal fluid (Persistent cystic changes superior fovea).    Notes *Images captured and stored on drive  Diagnosis / Impression:  OD: no images obtained today -- new VH OS: persistent cystic changes superior fovea   Clinical management:  See below  Abbreviations: NFP - Normal foveal profile. CME - cystoid macular edema. PED - pigment epithelial detachment. IRF - intraretinal fluid. SRF - subretinal fluid. EZ - ellipsoid zone. ERM - epiretinal membrane. ORA - outer retinal atrophy. ORT - outer retinal tubulation. SRHM - subretinal hyper-reflective material      Intravitreal Injection, Pharmacologic Agent - OD - Right Eye       Time Out 02/03/2021. 8:40 AM. Confirmed correct patient, procedure, site, and patient consented.   Anesthesia Topical anesthesia was used. Anesthetic medications included Lidocaine 2%, Proparacaine 0.5%.   Procedure A supplied needle was used.   Injection: 1.25 mg Bevacizumab 1.30m/0.05ml   Route: Intravitreal, Site: Right Eye   NDC:: 33007-622-63 Lot: [id number]'@6' , Expiration date: 04/05/2021, Waste: 0 mL   Post-op Post injection exam found visual acuity of at least counting fingers. The patient tolerated the procedure well. There were no complications. The patient received written and verbal post procedure care education.      B-Scan Ultrasound - OD - Right Eye       Quality was good. Findings included vitreous opacities, vitreous hemorrhage.   Notes **Images stored on drive**  Impression: OD: vitreous opacities consistent with hemorrhage; no obvious RT/RD or mass; ?thickened hyperechoic signal of retina -- ?retinal edema             ASSESSMENT/PLAN:    ICD-10-CM  1. Vitreous hemorrhage of right eye (HCC)  H43.11 Intravitreal Injection, Pharmacologic Agent - OD - Right Eye    B-Scan Ultrasound - OD - Right Eye    Bevacizumab (AVASTIN) SOLN 1.25 mg    2. Moderate nonproliferative diabetic retinopathy of both eyes without macular edema associated with type 2 diabetes mellitus (HCC)  Z22.4825  Intravitreal Injection, Pharmacologic Agent - OD - Right Eye    Bevacizumab (AVASTIN) SOLN 1.25 mg    3. Retinal edema  H35.81 OCT, Retina - OU - Both Eyes    4. Essential hypertension  I10     5. Hypertensive retinopathy of both eyes  H35.033     6. Pseudophakia of both eyes  Z96.1     7. Anatomical narrow angle  H40.039      Vitreous Hemorrhage OD - onset of floaters and vision loss 1 week ago while putting up Christmas decorations - likely related to history of DM and HTN - BCVA OD CF 1' from 20/20 - exam shows diffuse VH - b-scan 12.8.22 shows diffuse VH, ?thickening / edema of retina OD - VH precautions reviewed -- minimize activities, keep head elevated, avoid ASA/NSAIDs/blood thinners as able - discussed findings, prognosis and treatment options - recommend IVA OD #1 to help expedite clearance - f/u 1 week, DFE, OCT  2,3. Moderate nonproliferative diabetic retinopathy w/o DME, OU  - exam with scattered MA OU -- OD w/ new VH as above  - BCVA 20/25 OS  - OCT OS: Mild persistent cystic changes superior fovea  - discussed findings, prognosis  - recommend continued monitoring  4,5. Hypertensive retinopathy OU  - discussed importance of tight BP control  - monitor  6. Pseudophakia OU  - s/p CE/IOL OU  - beautiful surgeries by Dr. Talbert Forest, doing well  - monitor  7. Anatomical Narrow Angles OU  - s/p LPI OU by Dr. Talbert Forest  - OS patent LPI and open angle  - OD w/ closed PI  - saw Dr. Nancy Fetter on 09.21.20--no intervention recommended  Ophthalmic Meds Ordered this visit:  Meds ordered this encounter  Medications   Bevacizumab (AVASTIN) SOLN 1.25 mg     Return in about 1 week (around 02/10/2021) for f/u VH OD, DFE, OCT.  There are no Patient Instructions on file for this visit.   Explained the diagnoses, plan, and follow up with the patient and they expressed understanding.  Patient expressed understanding of the importance of proper follow up care.   This document  serves as a record of services personally performed by Gardiner Sleeper, MD, PhD. It was created on their behalf by San Jetty. Owens Shark, OA an ophthalmic technician. The creation of this record is the provider's dictation and/or activities during the visit.    Electronically signed by: San Jetty. Marguerita Merles 12.02.2022 11:30 AM   Gardiner Sleeper, M.D., Ph.D. Diseases & Surgery of the Retina and Vitreous [facility]  I have reviewed the above documentation for accuracy and completeness, and I agree with the above. Gardiner Sleeper, M.D., Ph.D. 02/03/21 11:30 AM  Abbreviations: M myopia (nearsighted); A astigmatism; H hyperopia (farsighted); P presbyopia; Mrx spectacle prescription;  CTL contact lenses; OD right eye; OS left eye; OU both eyes  XT exotropia; ET esotropia; PEK punctate epithelial keratitis; PEE punctate epithelial erosions; DES dry eye syndrome; MGD meibomian gland dysfunction; ATs artificial tears; PFAT's preservative free artificial tears; [place] nuclear sclerotic cataract; PSC posterior subcapsular cataract; ERM epi-retinal membrane; PVD posterior  vitreous detachment; RD retinal detachment; DM diabetes mellitus; DR diabetic retinopathy; NPDR non-proliferative diabetic retinopathy; PDR proliferative diabetic retinopathy; CSME clinically significant macular edema; DME diabetic macular edema; dbh dot blot hemorrhages; CWS cotton wool spot; POAG primary open angle glaucoma; C/D cup-to-disc ratio; HVF humphrey visual field; GVF goldmann visual field; OCT optical coherence tomography; IOP intraocular pressure; BRVO Branch retinal vein occlusion; CRVO central retinal vein occlusion; CRAO central retinal artery occlusion; BRAO branch retinal artery occlusion; RT retinal tear; SB scleral buckle; PPV pars plana vitrectomy; VH Vitreous hemorrhage; PRP panretinal laser photocoagulation; IVK intravitreal kenalog; VMT vitreomacular traction; MH Macular hole;  NVD neovascularization of the  disc; NVE neovascularization elsewhere; AREDS age related eye disease study; ARMD age related macular degeneration; POAG primary open angle glaucoma; EBMD epithelial/anterior basement membrane dystrophy; ACIOL anterior chamber intraocular lens; IOL intraocular lens; PCIOL posterior chamber intraocular lens; Phaco/IOL phacoemulsification with intraocular lens placement; [place] photorefractive keratectomy; LASIK laser assisted in situ keratomileusis; HTN hypertension; DM diabetes mellitus; COPD chronic obstructive pulmonary disease

## 2021-02-03 ENCOUNTER — Encounter (INDEPENDENT_AMBULATORY_CARE_PROVIDER_SITE_OTHER): Payer: Self-pay | Admitting: Ophthalmology

## 2021-02-03 ENCOUNTER — Other Ambulatory Visit: Payer: Self-pay

## 2021-02-03 ENCOUNTER — Ambulatory Visit (INDEPENDENT_AMBULATORY_CARE_PROVIDER_SITE_OTHER): Payer: Medicare HMO | Admitting: Ophthalmology

## 2021-02-03 DIAGNOSIS — H35033 Hypertensive retinopathy, bilateral: Secondary | ICD-10-CM | POA: Diagnosis not present

## 2021-02-03 DIAGNOSIS — H4311 Vitreous hemorrhage, right eye: Secondary | ICD-10-CM

## 2021-02-03 DIAGNOSIS — E113393 Type 2 diabetes mellitus with moderate nonproliferative diabetic retinopathy without macular edema, bilateral: Secondary | ICD-10-CM | POA: Diagnosis not present

## 2021-02-03 DIAGNOSIS — Z961 Presence of intraocular lens: Secondary | ICD-10-CM | POA: Diagnosis not present

## 2021-02-03 DIAGNOSIS — H3581 Retinal edema: Secondary | ICD-10-CM

## 2021-02-03 DIAGNOSIS — H40039 Anatomical narrow angle, unspecified eye: Secondary | ICD-10-CM | POA: Diagnosis not present

## 2021-02-03 DIAGNOSIS — I10 Essential (primary) hypertension: Secondary | ICD-10-CM | POA: Diagnosis not present

## 2021-02-03 MED ORDER — BEVACIZUMAB CHEMO INJECTION 1.25MG/0.05ML SYRINGE FOR KALEIDOSCOPE
1.2500 mg | INTRAVITREAL | Status: AC | PRN
Start: 2021-02-03 — End: 2021-02-03
  Administered 2021-02-03: 1.25 mg via INTRAVITREAL

## 2021-02-07 ENCOUNTER — Ambulatory Visit: Payer: Medicare HMO | Admitting: Endocrinology

## 2021-02-08 NOTE — Progress Notes (Signed)
[facility] Note  02/10/2021     CHIEF COMPLAINT Patient presents for Retina Follow Up   HISTORY OF PRESENT ILLNESS: Sandra Washington is a 80 y.o. female who presents to the clinic today for:   HPI     Retina Follow Up   Patient presents with  Other (Vitreous heme).  In right eye.  Severity is moderate.  Duration of 1 week.  Since onset it is stable.  I, the attending physician,  performed the HPI with the patient and updated documentation appropriately.        Comments   Patient states vision still blurred OD. Still sees floaters in vision OD. No improvement OD, but no worse either. Vision seems good OS.       Last edited by Bernarda Caffey, MD on 02/10/2021 11:09 AM.    pt states she has been taking it easy and sleeping with her head elevated since the injection last week, she states there is "maybe just a little bit" of improvement, no eye pain  Referring physician: Lucianne Lei, MD [address] [place],  [place] [address]  HISTORICAL INFORMATION:   Selected notes from the MEDICAL RECORD NUMBER Referred by Dr. Madelin Headings for concern of diabetic retinopathy    CURRENT MEDICATIONS: No current outpatient medications on file. (Ophthalmic Drugs)   No current facility-administered medications for this visit. (Ophthalmic Drugs)   Current Outpatient Medications (Other)  Medication Sig   ACCU-CHEK SOFTCLIX LANCETS lancets Use bid   amLODipine (NORVASC) 5 MG tablet Take 5 mg by mouth 2 (two) times daily.   atorvastatin (LIPITOR) 20 MG tablet Take 1 tablet (20 mg total) by mouth daily. (Patient taking differently: Take 20 mg by mouth at bedtime.)   Blood Glucose Monitoring Suppl (ACCU-CHEK GUIDE ME) w/Device KIT 1 each by Does not apply route 2 (two) times daily. Use accu chek guide me device to check blood sugar twice daily.DX:E11.65   Cholecalciferol (VITAMIN D3) 5000 UNITS CAPS Take 5,000 Units by mouth daily.   glucose blood  (ACCU-CHEK GUIDE) test strip 1 each by Other route 2 (two) times daily. Use as instructed   insulin NPH Human (NOVOLIN N RELION) 100 UNIT/ML injection 8 U in am and 12 U hs (Patient taking differently: Inject 8-12 Units into the skin See admin instructions. Takes 8 units in the morning and 12 units at night)   insulin regular (NOVOLIN R RELION) 100 units/mL injection INJECT 8 UNITS SUBCUTANEOUSLY WITH BREAKFAST AND LUNCH, AND 16 UNITS WITH DINNER (Patient taking differently: Inject 8-16 Units into the skin See admin instructions. Takes 8 units  with breakfast and lunch, and 16 units with dinner)   meclizine (ANTIVERT) 25 MG tablet Take 25 mg by mouth 3 (three) times daily as needed for dizziness.   niacin (SLO-NIACIN) 500 MG tablet Take 500 mg by mouth at bedtime.   omeprazole (PRILOSEC) 40 MG capsule Take 40 mg by mouth daily.   ondansetron (ZOFRAN) 4 MG tablet Take 1 tablet (4 mg total) by mouth every 6 (six) hours as needed for nausea.   valsartan-hydrochlorothiazide (DIOVAN-HCT) 160-12.5 MG tablet Take 1 tablet by mouth in the morning.   acetaminophen (TYLENOL) 500 MG tablet Take 500-1,000 mg by mouth every 6 (six) hours as needed (for back aches).   No current facility-administered medications for this visit. (Other)   REVIEW OF SYSTEMS: ROS   Positive for: Neurological, Genitourinary, Endocrine, Eyes Negative for: Constitutional, Gastrointestinal, Skin, Musculoskeletal, HENT,  Cardiovascular, Respiratory, Psychiatric, Allergic/Imm, Heme/Lymph Last edited by Roselee Nova D, COT on 02/10/2021  7:50 AM.     ALLERGIES No Known Allergies  PAST MEDICAL HISTORY Past Medical History:  Diagnosis Date   Chronic kidney disease    kidney stone   Diabetes mellitus without complication ([place])    Diabetic retinopathy ([place])    Hypertension    Hypertensive retinopathy    Stroke [facility])    Past Surgical History:  Procedure Laterality Date   BREAST BIOPSY Left    CATARACT EXTRACTION Bilateral  2018   Dr. Tommy Rainwater   CHOLECYSTECTOMY N/A 07/01/2020   Procedure: LAPAROSCOPIC CHOLECYSTECTOMY;  Surgeon: Virl Cagey, MD;  Location: [facility];  Service: General;  Laterality: N/A;   EYE SURGERY     HAND SURGERY     IR RADIOLOGIST EVAL & MGMT  08/05/2020   NO PAST SURGERIES      FAMILY HISTORY History reviewed. No pertinent family history.  SOCIAL HISTORY Social History   Tobacco Use   Smoking status: Former   Smokeless tobacco: Never  Substance Use Topics   Alcohol use: No   Drug use: No       OPHTHALMIC EXAM:  Base Eye Exam     Visual Acuity (Snellen - Linear)       Right Left   Dist cc CF at 1' 20/20 -2    Correction: Glasses         Tonometry (Tonopen, 7:55 AM)       Right Left   Pressure 12 12         Pupils       Dark Light Shape React APD   Right 3 2 Round Brisk None   Left 3 2 Round Brisk None         Visual Fields (Counting fingers)       Left Right    Full    Restrictions  Partial outer superior temporal, inferior temporal, inferior nasal deficiencies         Extraocular Movement       Right Left    Full, Ortho Full, Ortho         Neuro/Psych     Oriented x3: Yes   Mood/Affect: Normal         Dilation     Both eyes: 1.0% Mydriacyl, 2.5% Phenylephrine @ 7:55 AM           Slit Lamp and Fundus Exam     Slit Lamp Exam       Right Left   Lids/Lashes Dermatochalasis - upper lid, Meibomian gland dysfunction Dermatochalasis - upper lid, Meibomian gland dysfunction   Conjunctiva/Sclera nasal and temporal Pinguecula, Melanosis nasal and temporal Pinguecula, Melanosis   Cornea Arcus, trace Punctate epithelial erosions, Well healed temporal cataract wounds Arcus, 1+ Punctate epithelial erosions, Well healed temporal cataract wounds, mild EBMD   Anterior Chamber deep, clear, narrow temporal angle, No cell or flare Deep and quiet   Iris Round and moderately dilated, focal iris atrophy at 1100  --  PI not open, No NVI  Round and moderately dilated, small patent PI at 0200, mild bowing , No NVI   Lens PC IOL in good position with open PC PC IOL in good position with open PC   Anterior Vitreous Vitreous syneresis, diffuse VH Vitreous syneresis         Fundus Exam       Right Left   Disc No view Pink and Sharp, Compact  C/D Ratio 0.2 0.2   Macula No view, just red reflex Flat, Blunted foveal reflex with central cystic changes - persistent, scattered Microaneurysms   Vessels no view attenuated, mild tortuousity   Periphery no view; just red reflex Attached, scattered IRH           Refraction     Wearing Rx       Sphere Cylinder Axis Add   Right -2.25 +2.25 180 +2.50   Left -1.00 +1.50 175 +2.50    Type: prog            IMAGING AND PROCEDURES  Imaging and Procedures for '@TODAY' @  OCT, Retina - OU - Both Eyes       Right Eye Findings include (No images obtained today).   Left Eye Quality was good. Central Foveal Thickness: 258. Progression has been stable. Findings include normal foveal contour, no SRF, intraretinal hyper-reflective material, intraretinal fluid (Persistent cystic changes superior fovea).   Notes *Images captured and stored on drive  Diagnosis / Impression:  OD: no images obtained today -- VH OS: persistent cystic changes superior fovea   Clinical management:  See below  Abbreviations: NFP - Normal foveal profile. CME - cystoid macular edema. PED - pigment epithelial detachment. IRF - intraretinal fluid. SRF - subretinal fluid. EZ - ellipsoid zone. ERM - epiretinal membrane. ORA - outer retinal atrophy. ORT - outer retinal tubulation. SRHM - subretinal hyper-reflective material      B-Scan Ultrasound - OD - Right Eye       Quality was good. Findings included vitreous opacities, vitreous hemorrhage.   Notes **Images stored on drive**  Impression: OD: persistent vitreous opacities consistent with hemorrhage; no obvious RT/RD or mass; thickened  hyperechoic signal of retina -- ?retinal edema            ASSESSMENT/PLAN:    ICD-10-CM   1. Vitreous hemorrhage of right eye (HCC)  H43.11 B-Scan Ultrasound - OD - Right Eye    2. Moderate nonproliferative diabetic retinopathy of both eyes without macular edema associated with type 2 diabetes mellitus ([place])  Z22.4825 OCT, Retina - OU - Both Eyes    3. Essential hypertension  I10     4. Hypertensive retinopathy of both eyes  H35.033     5. Pseudophakia of both eyes  Z96.1     6. Anatomical narrow angle  H40.039      Vitreous Hemorrhage OD - onset of floaters and vision loss beginning of Dec 2022 while putting up Christmas decorations - likely related to history of DM and HTN - s/p IVA OD #1 (12.08.22) - no significant improvement - BCVA OD stable at CF 1'  - exam shows diffuse VH -- posterior visualization worse today - repeat b-scan 12.15.22 shows diffuse VH, thickening / edema of retina OD - VH precautions reviewed -- minimize activities, keep head elevated, avoid ASA/NSAIDs/blood thinners as able - consider surgery next Thursday, December 22 -- will book OR and see pt back on Tuesday - f/u Tuesday, December 20, DFE, OCT  2. Moderate nonproliferative diabetic retinopathy w/o DME, OU  - exam with scattered MA OU -- OD w/ new VH as above  - BCVA 20/20 OS  - OCT OS: Mild persistent cystic changes superior fovea  - discussed findings, prognosis  - recommend continued monitoring  3,4. Hypertensive retinopathy OU  - discussed importance of tight BP control  - monitor  5. Pseudophakia OU  - s/p CE/IOL OU  - beautiful surgeries by Dr. Talbert Forest,  doing well  - monitor  6. Anatomical Narrow Angles OU  - s/p LPI OU by Dr. Talbert Forest  - OS patent LPI and open angle  - OD w/ closed PI  - saw Dr. Nancy Fetter on 09.21.20--no intervention recommended  Ophthalmic Meds Ordered this visit:  No orders of the defined types were placed in this encounter.    Return in about 5 days (around  02/15/2021) for f/u VH OD, DFE, OCT.  There are no Patient Instructions on file for this visit.   Explained the diagnoses, plan, and follow up with the patient and they expressed understanding.  Patient expressed understanding of the importance of proper follow up care.   This document serves as a record of services personally performed by Gardiner Sleeper, MD, PhD. It was created on their behalf by San Jetty. Owens Shark, OA an ophthalmic technician. The creation of this record is the provider's dictation and/or activities during the visit.    Electronically signed by: San Jetty. Owens Shark, [place] 12.13.2022 11:21 AM  Gardiner Sleeper, M.D., Ph.D. Diseases & Surgery of the Retina and Vitreous [facility]  I have reviewed the above documentation for accuracy and completeness, and I agree with the above. Gardiner Sleeper, M.D., Ph.D. 02/10/21 11:21 AM  Abbreviations: M myopia (nearsighted); A astigmatism; H hyperopia (farsighted); P presbyopia; Mrx spectacle prescription;  CTL contact lenses; OD right eye; OS left eye; OU both eyes  XT exotropia; ET esotropia; PEK punctate epithelial keratitis; PEE punctate epithelial erosions; DES dry eye syndrome; MGD meibomian gland dysfunction; ATs artificial tears; PFAT's preservative free artificial tears; [place] nuclear sclerotic cataract; PSC posterior subcapsular cataract; ERM epi-retinal membrane; PVD posterior vitreous detachment; RD retinal detachment; DM diabetes mellitus; DR diabetic retinopathy; NPDR non-proliferative diabetic retinopathy; PDR proliferative diabetic retinopathy; CSME clinically significant macular edema; DME diabetic macular edema; dbh dot blot hemorrhages; CWS cotton wool spot; POAG primary open angle glaucoma; C/D cup-to-disc ratio; HVF humphrey visual field; GVF goldmann visual field; OCT optical coherence tomography; IOP intraocular pressure; BRVO Branch retinal vein occlusion; CRVO central retinal vein occlusion; CRAO central  retinal artery occlusion; BRAO branch retinal artery occlusion; RT retinal tear; SB scleral buckle; PPV pars plana vitrectomy; VH Vitreous hemorrhage; PRP panretinal laser photocoagulation; IVK intravitreal kenalog; VMT vitreomacular traction; MH Macular hole;  NVD neovascularization of the disc; NVE neovascularization elsewhere; AREDS age related eye disease study; ARMD age related macular degeneration; POAG primary open angle glaucoma; EBMD epithelial/anterior basement membrane dystrophy; ACIOL anterior chamber intraocular lens; IOL intraocular lens; PCIOL posterior chamber intraocular lens; Phaco/IOL phacoemulsification with intraocular lens placement; [place] photorefractive keratectomy; LASIK laser assisted in situ keratomileusis; HTN hypertension; DM diabetes mellitus; COPD chronic obstructive pulmonary disease

## 2021-02-10 ENCOUNTER — Encounter (INDEPENDENT_AMBULATORY_CARE_PROVIDER_SITE_OTHER): Payer: Self-pay | Admitting: Ophthalmology

## 2021-02-10 ENCOUNTER — Other Ambulatory Visit: Payer: Self-pay

## 2021-02-10 ENCOUNTER — Ambulatory Visit (INDEPENDENT_AMBULATORY_CARE_PROVIDER_SITE_OTHER): Payer: Medicare HMO | Admitting: Ophthalmology

## 2021-02-10 DIAGNOSIS — H4311 Vitreous hemorrhage, right eye: Secondary | ICD-10-CM | POA: Diagnosis not present

## 2021-02-10 DIAGNOSIS — H40039 Anatomical narrow angle, unspecified eye: Secondary | ICD-10-CM | POA: Diagnosis not present

## 2021-02-10 DIAGNOSIS — I10 Essential (primary) hypertension: Secondary | ICD-10-CM | POA: Diagnosis not present

## 2021-02-10 DIAGNOSIS — E113393 Type 2 diabetes mellitus with moderate nonproliferative diabetic retinopathy without macular edema, bilateral: Secondary | ICD-10-CM | POA: Diagnosis not present

## 2021-02-10 DIAGNOSIS — Z961 Presence of intraocular lens: Secondary | ICD-10-CM

## 2021-02-10 DIAGNOSIS — H3581 Retinal edema: Secondary | ICD-10-CM

## 2021-02-10 DIAGNOSIS — H35033 Hypertensive retinopathy, bilateral: Secondary | ICD-10-CM | POA: Diagnosis not present

## 2021-02-10 NOTE — Progress Notes (Signed)
[facility] Note  02/15/2021     CHIEF COMPLAINT Patient presents for Retina Follow Up   HISTORY OF PRESENT ILLNESS: Sandra Washington is a 80 y.o. female who presents to the clinic today for:   HPI     Retina Follow Up   Patient presents with  Other.  In right eye.  Severity is moderate.  Duration of 5 days.  Since onset it is stable.  I, the attending physician,  performed the HPI with the patient and updated documentation appropriately.        Comments   Pt states vision may be a "tad" better, she denies fol, BG was 74 this morning      Last edited by Bernarda Caffey, MD on 02/15/2021  9:29 AM.    Pt states vision may be a little better, she is trying to sleep with her head elevated in a recliner  Referring physician: Madelin Headings, DO [address] [place],  [place] [address]  HISTORICAL INFORMATION:   Selected notes from the [facility] Referred by Dr. Madelin Headings for concern of diabetic retinopathy   CURRENT MEDICATIONS: No current outpatient medications on file. (Ophthalmic Drugs)   No current facility-administered medications for this visit. (Ophthalmic Drugs)   Current Outpatient Medications (Other)  Medication Sig   ACCU-CHEK SOFTCLIX LANCETS lancets Use bid   acetaminophen (TYLENOL) 500 MG tablet Take 500-1,000 mg by mouth every 6 (six) hours as needed (for back aches).   amLODipine (NORVASC) 5 MG tablet Take 5 mg by mouth 2 (two) times daily.   atorvastatin (LIPITOR) 20 MG tablet Take 1 tablet (20 mg total) by mouth daily. (Patient taking differently: Take 20 mg by mouth at bedtime.)   Blood Glucose Monitoring Suppl (ACCU-CHEK GUIDE ME) w/Device KIT 1 each by Does not apply route 2 (two) times daily. Use accu chek guide me device to check blood sugar twice daily.DX:E11.65   Cholecalciferol (VITAMIN D3) 5000 UNITS CAPS Take 5,000 Units by mouth daily.   glucose blood (ACCU-CHEK GUIDE) test strip 1 each by Other  route 2 (two) times daily. Use as instructed   insulin NPH Human (NOVOLIN N RELION) 100 UNIT/ML injection 8 U in am and 12 U hs (Patient taking differently: Inject 8-12 Units into the skin See admin instructions. Takes 8 units in the morning and 12 units at night)   insulin regular (NOVOLIN R RELION) 100 units/mL injection INJECT 8 UNITS SUBCUTANEOUSLY WITH BREAKFAST AND LUNCH, AND 16 UNITS WITH DINNER (Patient taking differently: Inject 8-16 Units into the skin See admin instructions. Takes 8 units  with breakfast and lunch, and 16 units with dinner)   meclizine (ANTIVERT) 25 MG tablet Take 25 mg by mouth 3 (three) times daily as needed for dizziness.   niacin (SLO-NIACIN) 500 MG tablet Take 500 mg by mouth at bedtime.   omeprazole (PRILOSEC) 40 MG capsule Take 40 mg by mouth daily.   ondansetron (ZOFRAN) 4 MG tablet Take 1 tablet (4 mg total) by mouth every 6 (six) hours as needed for nausea.   valsartan-hydrochlorothiazide (DIOVAN-HCT) 160-12.5 MG tablet Take 1 tablet by mouth in the morning.   No current facility-administered medications for this visit. (Other)   REVIEW OF SYSTEMS: ROS   Positive for: Endocrine, Cardiovascular, Eyes Negative for: Constitutional, Gastrointestinal, Neurological, Skin, Genitourinary, Musculoskeletal, HENT, Respiratory, Psychiatric, Allergic/Imm, Heme/Lymph Last edited by Debbrah Alar, COT on 02/15/2021  7:54 AM.     ALLERGIES No Known Allergies  PAST MEDICAL HISTORY Past Medical History:  Diagnosis Date   Chronic kidney disease    kidney stone   Diabetes mellitus without complication (HCC)    Diabetic retinopathy ([place])    Hypertension    Hypertensive retinopathy    Stroke [facility])    Past Surgical History:  Procedure Laterality Date   BREAST BIOPSY Left    CATARACT EXTRACTION Bilateral 2018   Dr. Tommy Rainwater   CHOLECYSTECTOMY N/A 07/01/2020   Procedure: LAPAROSCOPIC CHOLECYSTECTOMY;  Surgeon: Virl Cagey, MD;  Location: [facility];  Service:  General;  Laterality: N/A;   EYE SURGERY     HAND SURGERY     IR RADIOLOGIST EVAL & MGMT  08/05/2020   NO PAST SURGERIES     FAMILY HISTORY History reviewed. No pertinent family history.  SOCIAL HISTORY Social History   Tobacco Use   Smoking status: Former   Smokeless tobacco: Never  Substance Use Topics   Alcohol use: No   Drug use: No       OPHTHALMIC EXAM:  Base Eye Exam     Visual Acuity (Snellen - Linear)       Right Left   Dist cc CF at 1' 20/20         Tonometry (Tonopen, 8:00 AM)       Right Left   Pressure 12 17         Pupils       Dark Light Shape React APD   Right 3 2 Round Brisk None   Left 3 2 Round Brisk None         Visual Fields       Left Right    Full   Pt could see my hand, but could not count fingers        Neuro/Psych     Oriented x3: Yes   Mood/Affect: Normal         Dilation     Right eye: 1.0% Mydriacyl, 2.5% Phenylephrine @ 8:00 AM           Slit Lamp and Fundus Exam     Slit Lamp Exam       Right Left   Lids/Lashes Dermatochalasis - upper lid, Meibomian gland dysfunction Dermatochalasis - upper lid, Meibomian gland dysfunction   Conjunctiva/Sclera nasal and temporal Pinguecula, Melanosis nasal and temporal Pinguecula, Melanosis   Cornea Arcus, trace Punctate epithelial erosions, Well healed temporal cataract wounds Arcus, 1+ Punctate epithelial erosions, Well healed temporal cataract wounds, mild EBMD   Anterior Chamber deep, clear, narrow temporal angle, No cell or flare Deep and quiet   Iris Round and moderately dilated, focal iris atrophy at 1100  --  PI not open, No NVI Round and moderately dilated, small patent PI at 0200, mild bowing , No NVI   Lens PC IOL in good position with open PC PC IOL in good position with open PC   Anterior Vitreous Vitreous syneresis, diffuse VH w/ scattered blood clots Vitreous syneresis         Fundus Exam       Right Left   Disc No view    C/D Ratio 0.2 0.2    Macula No view, just red reflex    Vessels no view    Periphery no view; just red reflex             IMAGING AND PROCEDURES  Imaging and Procedures for _0 @  OCT, Retina - OU - Both Eyes  Right Eye Findings include (No images obtained today).   Left Eye Quality was good. Central Foveal Thickness: 252. Progression has been stable. Findings include no SRF, intraretinal hyper-reflective material, intraretinal fluid, abnormal foveal contour (Persistent cystic changes superior fovea).   Notes *Images captured and stored on drive  Diagnosis / Impression:  OD: no images obtained today -- VH OS: persistent cystic changes superior fovea   Clinical management:  See below  Abbreviations: NFP - Normal foveal profile. CME - cystoid macular edema. PED - pigment epithelial detachment. IRF - intraretinal fluid. SRF - subretinal fluid. EZ - ellipsoid zone. ERM - epiretinal membrane. ORA - outer retinal atrophy. ORT - outer retinal tubulation. SRHM - subretinal hyper-reflective material            ASSESSMENT/PLAN:    ICD-10-CM   1. Vitreous hemorrhage of right eye (HCC)  H43.11 OCT, Retina - OU - Both Eyes    2. Moderate nonproliferative diabetic retinopathy of both eyes without macular edema associated with type 2 diabetes mellitus ([place])  I14.4315 OCT, Retina - OU - Both Eyes    3. Essential hypertension  I10     4. Hypertensive retinopathy of both eyes  H35.033     5. Pseudophakia of both eyes  Z96.1     6. Anatomical narrow angle  H40.039      Vitreous Hemorrhage OD - onset of floaters and vision loss beginning of Dec 2022 while putting up Christmas decorations - likely related to history of DM and HTN - s/p IVA OD #1 (12.08.22) - no significant improvement - BCVA OD stable at CF 1'  - exam shows diffuse VH w/ +blood clots - repeat b-scan 12.15.22 shows diffuse VH, thickening / edema of retina OD - VH precautions reviewed -- minimize activities, keep head  elevated, avoid ASA/NSAIDs/blood thinners as able - recommend 25g PPV w/ EL OD under general anesthesia - RBA of procedure discussed, questions answered - informed consent obtained and signed  - surgery scheduled for Thursday, 12.22.22 -- [facility] OR 08 - f/u Friday for POV  2. Moderate nonproliferative diabetic retinopathy w/o DME, OU  - exam with scattered MA OU -- OD w/ new VH as above  - BCVA 20/25 OS  - OCT OS: Mild persistent cystic changes superior fovea  - discussed findings, prognosis  - recommend continued monitoring  3,4. Hypertensive retinopathy OU  - discussed importance of tight BP control  - monitor  5. Pseudophakia OU  - s/p CE/IOL OU  - beautiful surgeries by Dr. Talbert Forest, doing well  - monitor  6. Anatomical Narrow Angles OU  - s/p LPI OU by Dr. Talbert Forest  - OS patent LPI and open angle  - OD w/ closed PI  - saw Dr. Nancy Fetter on 09.21.20--no intervention recommended  Ophthalmic Meds Ordered this visit:  No orders of the defined types were placed in this encounter.    Return for f/u Friday, 8:00am, POV.  There are no Patient Instructions on file for this visit.   Explained the diagnoses, plan, and follow up with the patient and they expressed understanding.  Patient expressed understanding of the importance of proper follow up care.   This document serves as a record of services personally performed by Gardiner Sleeper, MD, PhD. It was created on their behalf by Estill Bakes, COT an ophthalmic technician. The creation of this record is the provider's dictation and/or activities during the visit.    Electronically signed by: Estill Bakes, COT 12.15.22 @ 9:37 AM  Gardiner Sleeper, M.D., Ph.D. Diseases & Surgery of the Retina and Vitreous [facility]  I have reviewed the above documentation for accuracy and completeness, and I agree with the above. Gardiner Sleeper, M.D., Ph.D. 02/15/21 9:37 AM   Abbreviations: M myopia (nearsighted); A  astigmatism; H hyperopia (farsighted); P presbyopia; Mrx spectacle prescription;  CTL contact lenses; OD right eye; OS left eye; OU both eyes  XT exotropia; ET esotropia; PEK punctate epithelial keratitis; PEE punctate epithelial erosions; DES dry eye syndrome; MGD meibomian gland dysfunction; ATs artificial tears; PFAT's preservative free artificial tears; [place] nuclear sclerotic cataract; PSC posterior subcapsular cataract; ERM epi-retinal membrane; PVD posterior vitreous detachment; RD retinal detachment; DM diabetes mellitus; DR diabetic retinopathy; NPDR non-proliferative diabetic retinopathy; PDR proliferative diabetic retinopathy; CSME clinically significant macular edema; DME diabetic macular edema; dbh dot blot hemorrhages; CWS cotton wool spot; POAG primary open angle glaucoma; C/D cup-to-disc ratio; HVF humphrey visual field; GVF goldmann visual field; OCT optical coherence tomography; IOP intraocular pressure; BRVO Branch retinal vein occlusion; CRVO central retinal vein occlusion; CRAO central retinal artery occlusion; BRAO branch retinal artery occlusion; RT retinal tear; SB scleral buckle; PPV pars plana vitrectomy; VH Vitreous hemorrhage; PRP panretinal laser photocoagulation; IVK intravitreal kenalog; VMT vitreomacular traction; MH Macular hole;  NVD neovascularization of the disc; NVE neovascularization elsewhere; AREDS age related eye disease study; ARMD age related macular degeneration; POAG primary open angle glaucoma; EBMD epithelial/anterior basement membrane dystrophy; ACIOL anterior chamber intraocular lens; IOL intraocular lens; PCIOL posterior chamber intraocular lens; Phaco/IOL phacoemulsification with intraocular lens placement; [place] photorefractive keratectomy; LASIK laser assisted in situ keratomileusis; HTN hypertension; DM diabetes mellitus; COPD chronic obstructive pulmonary disease

## 2021-02-15 ENCOUNTER — Ambulatory Visit (INDEPENDENT_AMBULATORY_CARE_PROVIDER_SITE_OTHER): Payer: Medicare HMO | Admitting: Ophthalmology

## 2021-02-15 ENCOUNTER — Encounter (INDEPENDENT_AMBULATORY_CARE_PROVIDER_SITE_OTHER): Payer: Self-pay | Admitting: Ophthalmology

## 2021-02-15 ENCOUNTER — Encounter (HOSPITAL_COMMUNITY): Payer: Self-pay | Admitting: Ophthalmology

## 2021-02-15 ENCOUNTER — Other Ambulatory Visit: Payer: Self-pay

## 2021-02-15 DIAGNOSIS — E113393 Type 2 diabetes mellitus with moderate nonproliferative diabetic retinopathy without macular edema, bilateral: Secondary | ICD-10-CM | POA: Diagnosis not present

## 2021-02-15 DIAGNOSIS — H40039 Anatomical narrow angle, unspecified eye: Secondary | ICD-10-CM | POA: Diagnosis not present

## 2021-02-15 DIAGNOSIS — Z961 Presence of intraocular lens: Secondary | ICD-10-CM

## 2021-02-15 DIAGNOSIS — I10 Essential (primary) hypertension: Secondary | ICD-10-CM | POA: Diagnosis not present

## 2021-02-15 DIAGNOSIS — H4311 Vitreous hemorrhage, right eye: Secondary | ICD-10-CM | POA: Diagnosis not present

## 2021-02-15 DIAGNOSIS — H35033 Hypertensive retinopathy, bilateral: Secondary | ICD-10-CM | POA: Diagnosis not present

## 2021-02-15 NOTE — Progress Notes (Addendum)
Sandra Washington -Ronnald Ramp denies chest pain or shortness of breath. Patient denies having any s/s of Covid in her household.  Patient denies any known exposure to Covid.   Sandra Alwyn Pea has type II diabetes, patient reports that CBGs run 74-150.  I instructed patient to take 5 units of Novolin on Wednesday evening, on Thursday take 4 units if CBG is greater than 70; if CBG is less than 70 do not take Insulin- take  4 glucose tablets and recheck CBG in 15 minutes- if it is not > 70 call Pre- Op Desk. Check CBG every 2 hours until you leave to come to the hospital. Do not take Novolog R Thursday am.  I instructed Sandra Tasmine Hipwell- Ronnald Ramp to hold vitamins and fish oil. Patient's PCP is Dr. Clayburn Pert. Endocrinologist is Dr. Dwyane Dee. I asked anesthesia PA- C to review chart.

## 2021-02-15 NOTE — H&P (Signed)
Sandra Washington is an 80 y.o. female.    Chief Complaint: vitreous hemorrhage OD  HPI: Pt with history of acute onset floaters and vision loss OD in early December while decorating for Christmas. On dilated exam, found to have a dense vitreous hemorrhage of the right eye that has failed to clear or improve. After a discussion of the risks benefits and alternatives to surgery, the patient has elected to proceed with surgery to clear the vitreous hemorrhage of the right eye -- 25g PPV w/ EL under general anesthesia.  Past Medical History:  Diagnosis Date   Chronic kidney disease    kidney stone   Diabetes mellitus without complication (HCC)    Diabetic retinopathy ([place])    Hypertension    Hypertensive retinopathy    Stroke [facility])     Past Surgical History:  Procedure Laterality Date   BREAST BIOPSY Left    CATARACT EXTRACTION Bilateral 2018   Dr. Tommy Rainwater   CHOLECYSTECTOMY N/A 07/01/2020   Procedure: LAPAROSCOPIC CHOLECYSTECTOMY;  Surgeon: Virl Cagey, MD;  Location: [facility];  Service: General;  Laterality: N/A;   EYE SURGERY     HAND SURGERY     IR RADIOLOGIST EVAL & MGMT  08/05/2020   NO PAST SURGERIES      No family history on file. Social History:  reports that she has quit smoking. She has never used smokeless tobacco. She reports that she does not drink alcohol and does not use drugs.  Allergies: No Known Allergies  No medications prior to admission.    Review of systems otherwise negative  There were no vitals taken for this visit.  Physical exam: Mental status: oriented x3. Eyes: See eye exam associated with this date of surgery Ears, Nose, Throat: within normal limits Neck: Within Normal limits General: within normal limits Chest: Within normal limits Breast: deferred Heart: Within normal limits Abdomen: Within normal limits GU: deferred Extremities: within normal limits Skin: within normal limits  Assessment/Plan Non-clearing vitreous  hemorrhage, RIGHT EYE  Plan: To [facility] for 25g PPV w/ endolaser, RIGHT EYE, under general anesthesia - case scheduled for Thursday, 12.22.22, 1130 am -- [facility] OR 08  Gardiner Sleeper, M.D., Ph.D. Vitreoretinal Surgeon [facility]

## 2021-02-17 ENCOUNTER — Encounter (HOSPITAL_COMMUNITY): Payer: Self-pay | Admitting: Ophthalmology

## 2021-02-17 ENCOUNTER — Other Ambulatory Visit: Payer: Self-pay

## 2021-02-17 ENCOUNTER — Ambulatory Visit (HOSPITAL_COMMUNITY)
Admission: RE | Admit: 2021-02-17 | Discharge: 2021-02-17 | Disposition: A | Discharge status: Home / Self Care | Payer: Medicare HMO | Source: Ambulatory Visit | Attending: Ophthalmology | Admitting: Ophthalmology

## 2021-02-17 ENCOUNTER — Ambulatory Visit (HOSPITAL_COMMUNITY): Payer: Medicare HMO | Admitting: Physician Assistant

## 2021-02-17 ENCOUNTER — Encounter (HOSPITAL_COMMUNITY)
Admission: RE | Disposition: A | Discharge status: Home / Self Care | Payer: Self-pay | Source: Ambulatory Visit | Attending: Ophthalmology

## 2021-02-17 DIAGNOSIS — H3582 Retinal ischemia: Secondary | ICD-10-CM | POA: Diagnosis not present

## 2021-02-17 DIAGNOSIS — E11319 Type 2 diabetes mellitus with unspecified diabetic retinopathy without macular edema: Secondary | ICD-10-CM | POA: Diagnosis not present

## 2021-02-17 DIAGNOSIS — I10 Essential (primary) hypertension: Secondary | ICD-10-CM | POA: Diagnosis not present

## 2021-02-17 DIAGNOSIS — Z79899 Other long term (current) drug therapy: Secondary | ICD-10-CM | POA: Diagnosis not present

## 2021-02-17 DIAGNOSIS — Z87891 Personal history of nicotine dependence: Secondary | ICD-10-CM | POA: Insufficient documentation

## 2021-02-17 DIAGNOSIS — H4311 Vitreous hemorrhage, right eye: Secondary | ICD-10-CM | POA: Diagnosis not present

## 2021-02-17 DIAGNOSIS — Z794 Long term (current) use of insulin: Secondary | ICD-10-CM | POA: Diagnosis not present

## 2021-02-17 HISTORY — DX: Personal history of urinary calculi: Z87.442

## 2021-02-17 HISTORY — PX: PHOTOCOAGULATION WITH LASER: SHX6027

## 2021-02-17 HISTORY — DX: Other complications of anesthesia, initial encounter: T88.59XA

## 2021-02-17 HISTORY — DX: Gastro-esophageal reflux disease without esophagitis: K21.9

## 2021-02-17 HISTORY — PX: PARS PLANA VITRECTOMY: SHX2166

## 2021-02-17 LAB — CBC
HCT: 39.7 % (ref 36.0–46.0)
Hemoglobin: 12.5 g/dL (ref 12.0–15.0)
MCH: 27.9 pg (ref 26.0–34.0)
MCHC: 31.5 g/dL (ref 30.0–36.0)
MCV: 88.6 fL (ref 80.0–100.0)
Platelets: 205 10*3/uL (ref 150–400)
RBC: 4.48 MIL/uL (ref 3.87–5.11)
RDW: 13.6 % (ref 11.5–15.5)
WBC: 5 10*3/uL (ref 4.0–10.5)
nRBC: 0 % (ref 0.0–0.2)

## 2021-02-17 LAB — BASIC METABOLIC PANEL
Anion gap: 9 (ref 5–15)
BUN: 9 mg/dL (ref 8–23)
CO2: 25 mmol/L (ref 22–32)
Calcium: 9.2 mg/dL (ref 8.9–10.3)
Chloride: 107 mmol/L (ref 98–111)
Creatinine, Ser: 0.68 mg/dL (ref 0.44–1.00)
GFR, Estimated: >60 mL/min (ref >60)
Glucose, Bld: 111 mg/dL — ABNORMAL HIGH (ref 70–99)
Potassium: 3.3 mmol/L — ABNORMAL LOW (ref 3.5–5.1)
Sodium: 141 mmol/L (ref 135–145)

## 2021-02-17 LAB — GLUCOSE, CAPILLARY
Glucose-Capillary: 118 mg/dL — ABNORMAL HIGH (ref 70–99)
Glucose-Capillary: 98 mg/dL (ref 70–99)
Glucose-Capillary: 90 mg/dL (ref 70–99)

## 2021-02-17 SURGERY — PARS PLANA VITRECTOMY WITH 25 GAUGE
Anesthesia: General | Site: Eye | Laterality: Right

## 2021-02-17 MED ORDER — ATROPINE SULFATE 1 % OP SOLN
OPHTHALMIC | Status: AC
  Filled 2021-02-17: qty 5

## 2021-02-17 MED ORDER — ORAL CARE MOUTH RINSE: 15.0000 mL | Freq: Once | OROMUCOSAL | Status: AC

## 2021-02-17 MED ORDER — DEXAMETHASONE SODIUM PHOSPHATE 10 MG/ML IJ SOLN
INTRAMUSCULAR | Status: DC | PRN
  Administered 2021-02-17: 4 mg via INTRAVENOUS

## 2021-02-17 MED ORDER — DORZOLAMIDE HCL-TIMOLOL MAL 2-0.5 % OP SOLN
OPHTHALMIC | Status: AC
  Filled 2021-02-17: qty 10

## 2021-02-17 MED ORDER — LACTATED RINGERS IV SOLN: INTRAVENOUS | Status: DC

## 2021-02-17 MED ORDER — FENTANYL CITRATE (PF) 250 MCG/5ML IJ SOLN
INTRAMUSCULAR | Status: DC | PRN
  Administered 2021-02-17: 50 ug via INTRAVENOUS

## 2021-02-17 MED ORDER — OXYCODONE HCL 5 MG/5ML PO SOLN: 5.0000 mg | Freq: Once | ORAL | Status: DC | PRN

## 2021-02-17 MED ORDER — GATIFLOXACIN 0.5 % OP SOLN
OPHTHALMIC | Status: AC
  Filled 2021-02-17: qty 2.5

## 2021-02-17 MED ORDER — ATROPINE SULFATE 1 % OP SOLN
OPHTHALMIC | Status: DC | PRN
  Administered 2021-02-17: 1 [drp] via OPHTHALMIC

## 2021-02-17 MED ORDER — STERILE WATER FOR INJECTION IJ SOLN
INTRAMUSCULAR | Status: AC
  Filled 2021-02-17: qty 10

## 2021-02-17 MED ORDER — BSS IO SOLN
INTRAOCULAR | Status: DC | PRN
  Administered 2021-02-17: 15 mL via INTRAOCULAR

## 2021-02-17 MED ORDER — BRIMONIDINE TARTRATE 0.2 % OP SOLN
OPHTHALMIC | Status: DC | PRN
  Administered 2021-02-17: 1 [drp] via OPHTHALMIC

## 2021-02-17 MED ORDER — POLYMYXIN B SULFATE 500000 UNITS IJ SOLR
INTRAMUSCULAR | Status: AC
  Filled 2021-02-17: qty 500000

## 2021-02-17 MED ORDER — ACETAZOLAMIDE SODIUM 500 MG IJ SOLR
INTRAMUSCULAR | Status: AC
  Filled 2021-02-17: qty 500

## 2021-02-17 MED ORDER — TROPICAMIDE 1 % OP SOLN
1.0000 [drp] | OPHTHALMIC | Status: AC | PRN
  Administered 2021-02-17 (×3): 1 [drp] via OPHTHALMIC
  Filled 2021-02-17: qty 15

## 2021-02-17 MED ORDER — LIDOCAINE 2% (20 MG/ML) 5 ML SYRINGE
INTRAMUSCULAR | Status: AC
  Filled 2021-02-17: qty 5

## 2021-02-17 MED ORDER — PROPARACAINE HCL 0.5 % OP SOLN
1.0000 [drp] | OPHTHALMIC | Status: AC | PRN
  Administered 2021-02-17 (×3): 1 [drp] via OPHTHALMIC
  Filled 2021-02-17: qty 15

## 2021-02-17 MED ORDER — TOBRAMYCIN-DEXAMETHASONE 0.3-0.1 % OP OINT
TOPICAL_OINTMENT | OPHTHALMIC | Status: AC
  Filled 2021-02-17: qty 3.5

## 2021-02-17 MED ORDER — ATROPINE SULFATE 1 % OP SOLN
1.0000 [drp] | OPHTHALMIC | Status: AC | PRN
  Administered 2021-02-17 (×3): 1 [drp] via OPHTHALMIC
  Filled 2021-02-17: qty 5

## 2021-02-17 MED ORDER — EPINEPHRINE PF 1 MG/ML IJ SOLN
INTRAMUSCULAR | Status: AC
  Filled 2021-02-17: qty 1

## 2021-02-17 MED ORDER — SUGAMMADEX SODIUM 200 MG/2ML IV SOLN
INTRAVENOUS | Status: DC | PRN
  Administered 2021-02-17: 300 mg via INTRAVENOUS

## 2021-02-17 MED ORDER — CEFTAZIDIME 1 G IJ SOLR
INTRAMUSCULAR | Status: AC
  Filled 2021-02-17: qty 1

## 2021-02-17 MED ORDER — DEXAMETHASONE SODIUM PHOSPHATE 10 MG/ML IJ SOLN
INTRAMUSCULAR | Status: AC
  Filled 2021-02-17: qty 1

## 2021-02-17 MED ORDER — BSS IO SOLN
INTRAOCULAR | Status: AC
  Filled 2021-02-17: qty 15

## 2021-02-17 MED ORDER — ONDANSETRON HCL 4 MG/2ML IJ SOLN
INTRAMUSCULAR | Status: AC
  Filled 2021-02-17: qty 2

## 2021-02-17 MED ORDER — ACETAMINOPHEN 500 MG PO TABS
1000.0000 mg | ORAL_TABLET | Freq: Once | ORAL | Status: AC
  Administered 2021-02-17: 10:00:00 1000 mg via ORAL
  Filled 2021-02-17: qty 2

## 2021-02-17 MED ORDER — ROCURONIUM BROMIDE 10 MG/ML (PF) SYRINGE
PREFILLED_SYRINGE | INTRAVENOUS | Status: AC
  Filled 2021-02-17: qty 10

## 2021-02-17 MED ORDER — PHENYLEPHRINE HCL 10 % OP SOLN
1.0000 [drp] | OPHTHALMIC | Status: AC | PRN
  Administered 2021-02-17 (×3): 1 [drp] via OPHTHALMIC
  Filled 2021-02-17: qty 5

## 2021-02-17 MED ORDER — ONDANSETRON HCL 4 MG/2ML IJ SOLN
INTRAMUSCULAR | Status: DC | PRN
  Administered 2021-02-17: 4 mg via INTRAVENOUS

## 2021-02-17 MED ORDER — DORZOLAMIDE HCL-TIMOLOL MAL 2-0.5 % OP SOLN
OPHTHALMIC | Status: DC | PRN
  Administered 2021-02-17: 1 [drp] via OPHTHALMIC

## 2021-02-17 MED ORDER — BSS PLUS IO SOLN
INTRAOCULAR | Status: AC
  Filled 2021-02-17: qty 500

## 2021-02-17 MED ORDER — ROCURONIUM BROMIDE 100 MG/10ML IV SOLN
INTRAVENOUS | Status: DC | PRN
  Administered 2021-02-17: 10 mg via INTRAVENOUS
  Administered 2021-02-17: 70 mg via INTRAVENOUS

## 2021-02-17 MED ORDER — NA CHONDROIT SULF-NA HYALURON 40-30 MG/ML IO SOSY
INTRAOCULAR | Status: AC
  Filled 2021-02-17: qty 1

## 2021-02-17 MED ORDER — LIDOCAINE HCL (CARDIAC) PF 100 MG/5ML IV SOSY
PREFILLED_SYRINGE | INTRAVENOUS | Status: DC | PRN
  Administered 2021-02-17: 60 mg via INTRATRACHEAL

## 2021-02-17 MED ORDER — PROPOFOL 10 MG/ML IV BOLUS
INTRAVENOUS | Status: DC | PRN
  Administered 2021-02-17: 100 mg via INTRAVENOUS

## 2021-02-17 MED ORDER — BACITRACIN-POLYMYXIN B 500-10000 UNIT/GM OP OINT
TOPICAL_OINTMENT | OPHTHALMIC | Status: AC
  Filled 2021-02-17: qty 3.5

## 2021-02-17 MED ORDER — BUPIVACAINE HCL (PF) 0.75 % IJ SOLN
INTRAMUSCULAR | Status: AC
  Filled 2021-02-17: qty 10

## 2021-02-17 MED ORDER — PHENYLEPHRINE HCL-NACL 20-0.9 MG/250ML-% IV SOLN
INTRAVENOUS | Status: DC | PRN
  Administered 2021-02-17: 10 ug/min via INTRAVENOUS

## 2021-02-17 MED ORDER — TRIAMCINOLONE ACETONIDE 40 MG/ML IJ SUSP
INTRAMUSCULAR | Status: AC
  Filled 2021-02-17: qty 5

## 2021-02-17 MED ORDER — PREDNISOLONE ACETATE 1 % OP SUSP
OPHTHALMIC | Status: AC
  Filled 2021-02-17: qty 5

## 2021-02-17 MED ORDER — TRIAMCINOLONE ACETONIDE 40 MG/ML IJ SUSP
INTRAMUSCULAR | Status: DC | PRN
  Administered 2021-02-17: 40 mg

## 2021-02-17 MED ORDER — MIDAZOLAM HCL 2 MG/2ML IJ SOLN
INTRAMUSCULAR | Status: AC
  Filled 2021-02-17: qty 2

## 2021-02-17 MED ORDER — BRIMONIDINE TARTRATE 0.2 % OP SOLN
OPHTHALMIC | Status: AC
  Filled 2021-02-17: qty 5

## 2021-02-17 MED ORDER — OXYCODONE HCL 5 MG PO TABS: 5.0000 mg | ORAL_TABLET | Freq: Once | ORAL | Status: DC | PRN

## 2021-02-17 MED ORDER — PREDNISOLONE ACETATE 1 % OP SUSP
OPHTHALMIC | Status: DC | PRN
  Administered 2021-02-17: 1 [drp] via OPHTHALMIC

## 2021-02-17 MED ORDER — CHLORHEXIDINE GLUCONATE 0.12 % MT SOLN
15.0000 mL | Freq: Once | OROMUCOSAL | Status: AC
  Administered 2021-02-17: 10:00:00 15 mL via OROMUCOSAL
  Filled 2021-02-17: qty 15

## 2021-02-17 MED ORDER — ONDANSETRON HCL 4 MG/2ML IJ SOLN: 4.0000 mg | Freq: Once | INTRAMUSCULAR | Status: DC | PRN

## 2021-02-17 MED ORDER — BACITRACIN-POLYMYXIN B 500-10000 UNIT/GM OP OINT
TOPICAL_OINTMENT | OPHTHALMIC | Status: DC | PRN
  Administered 2021-02-17: 1 via OPHTHALMIC

## 2021-02-17 MED ORDER — NA CHONDROIT SULF-NA HYALURON 40-30 MG/ML IO SOSY
INTRAOCULAR | Status: DC | PRN
  Administered 2021-02-17: 0.5 mL via INTRAOCULAR

## 2021-02-17 MED ORDER — EPINEPHRINE PF 1 MG/ML IJ SOLN: INTRAOCULAR | Status: DC | PRN

## 2021-02-17 MED ORDER — CARBACHOL 0.01 % IO SOLN
INTRAOCULAR | Status: AC
  Filled 2021-02-17: qty 1.5

## 2021-02-17 MED ORDER — HYDROMORPHONE HCL 1 MG/ML IJ SOLN
INTRAMUSCULAR | Status: AC
  Filled 2021-02-17: qty 1

## 2021-02-17 MED ORDER — FENTANYL CITRATE (PF) 250 MCG/5ML IJ SOLN
INTRAMUSCULAR | Status: AC
  Filled 2021-02-17: qty 5

## 2021-02-17 MED ORDER — LIDOCAINE HCL (PF) 2 % IJ SOLN
INTRAMUSCULAR | Status: AC
  Filled 2021-02-17: qty 10

## 2021-02-17 MED ORDER — HYDROMORPHONE HCL 1 MG/ML IJ SOLN
0.2500 mg | INTRAMUSCULAR | Status: DC | PRN
  Administered 2021-02-17 (×2): 0.5 mg via INTRAVENOUS

## 2021-02-17 MED ORDER — STERILE WATER FOR INJECTION IJ SOLN
INTRAMUSCULAR | Status: DC | PRN
  Administered 2021-02-17: 12:00:00 20 mL

## 2021-02-17 MED ORDER — PROPOFOL 10 MG/ML IV BOLUS
INTRAVENOUS | Status: AC
  Filled 2021-02-17: qty 20

## 2021-02-17 MED ORDER — DEXMEDETOMIDINE (PRECEDEX) IN NS 20 MCG/5ML (4 MCG/ML) IV SYRINGE
PREFILLED_SYRINGE | INTRAVENOUS | Status: DC | PRN
  Administered 2021-02-17: 8 ug via INTRAVENOUS

## 2021-02-17 MED ORDER — GATIFLOXACIN 0.5 % OP SOLN OPTIME - NO CHARGE
OPHTHALMIC | Status: DC | PRN
  Administered 2021-02-17: 13:00:00 1 [drp] via OPHTHALMIC

## 2021-02-17 MED ORDER — SODIUM CHLORIDE (PF) 0.9 % IJ SOLN
INTRAMUSCULAR | Status: AC
  Filled 2021-02-17: qty 10

## 2021-02-17 MED ORDER — LIDOCAINE HCL (PF) 4 % IJ SOLN
INTRAMUSCULAR | Status: AC
  Filled 2021-02-17: qty 5

## 2021-02-17 SURGICAL SUPPLY — 75 items
APL SWBSTK 6 STRL LF DISP (MISCELLANEOUS) ×4
APPLICATOR COTTON TIP 6 STRL (MISCELLANEOUS) ×4 IMPLANT
APPLICATOR COTTON TIP 6IN STRL (MISCELLANEOUS) ×12
BAG COUNTER SPONGE SURGICOUNT (BAG) ×2 IMPLANT
BAG SPNG CNTER NS LX DISP (BAG) ×1
BAG SURGICOUNT SPONGE COUNTING (BAG) ×1
BAND WRIST GAS GREEN (MISCELLANEOUS) IMPLANT
BLADE EYE CATARACT 19 1.4 BEAV (BLADE) IMPLANT
BNDG EYE OVAL (GAUZE/BANDAGES/DRESSINGS) ×3 IMPLANT
CABLE BIPOLOR RESECTION CORD (MISCELLANEOUS) ×1 IMPLANT
CANNULA ANT CHAM MAIN (OPHTHALMIC RELATED) IMPLANT
CANNULA DUALBORE 25G (CANNULA) ×3 IMPLANT
CANNULA FLEX TIP 25G (CANNULA) ×3 IMPLANT
CANNULA TROCAR 23 GA VLV (OPHTHALMIC) IMPLANT
CANNULA TROCAR 23G VLV (OPHTHALMIC) IMPLANT
CANNULA VLV SOFT TIP 25G (OPHTHALMIC) IMPLANT
CANNULA VLV SOFT TIP 25GA (OPHTHALMIC) IMPLANT
CLOSURE STERI-STRIP 1/2X4 (GAUZE/BANDAGES/DRESSINGS) ×1
CLSR STERI-STRIP ANTIMIC 1/2X4 (GAUZE/BANDAGES/DRESSINGS) ×2 IMPLANT
DRAPE INCISE 51X51 W/FILM STRL (DRAPES) ×3 IMPLANT
DRAPE MICROSCOPE LEICA 46X105 (MISCELLANEOUS) ×3 IMPLANT
DRAPE OPHTHALMIC 77X100 STRL (CUSTOM PROCEDURE TRAY) ×3 IMPLANT
FILTER BLUE MILLIPORE (MISCELLANEOUS) IMPLANT
FILTER STRAW FLUID ASPIR (MISCELLANEOUS) IMPLANT
FORCEPS GRIESHABER ILM 25G A (INSTRUMENTS) IMPLANT
FORCEPS GRIESHABER MAX 25G (MISCELLANEOUS) IMPLANT
GAS AUTO FILL CONSTEL (OPHTHALMIC)
GAS AUTO FILL CONSTELLATION (OPHTHALMIC) IMPLANT
GAS WRIST BAND GREEN (MISCELLANEOUS)
GLOVE SURG MICRO LTX SZ7 (GLOVE) ×6 IMPLANT
GLOVE SURG MICRO LTX SZ7.5 (GLOVE) ×3 IMPLANT
GOWN STRL REUS W/ TWL LRG LVL3 (GOWN DISPOSABLE) ×2 IMPLANT
GOWN STRL REUS W/ TWL XL LVL3 (GOWN DISPOSABLE) ×1 IMPLANT
GOWN STRL REUS W/TWL LRG LVL3 (GOWN DISPOSABLE) ×6
GOWN STRL REUS W/TWL XL LVL3 (GOWN DISPOSABLE) ×3
KIT BASIN OR (CUSTOM PROCEDURE TRAY) ×3 IMPLANT
KIT PERFLUORON PROCEDURE 5ML (MISCELLANEOUS) IMPLANT
LENS VITRECTOMY FLAT OCLR DISP (MISCELLANEOUS) IMPLANT
LOOP FINESSE 25 GA (MISCELLANEOUS) IMPLANT
MICROPICK 25G (MISCELLANEOUS)
NDL 18GX1X1/2 (RX/OR ONLY) (NEEDLE) ×3 IMPLANT
NDL 25GX 5/8IN NON SAFETY (NEEDLE) ×3 IMPLANT
NDL FILTER BLUNT 18X1 1/2 (NEEDLE) ×1 IMPLANT
NDL HYPO 30X.5 LL (NEEDLE) ×2 IMPLANT
NDL PRECISIONGLIDE 27X1.5 (NEEDLE) IMPLANT
NEEDLE 18GX1X1/2 (RX/OR ONLY) (NEEDLE) ×9 IMPLANT
NEEDLE 25GX 5/8IN NON SAFETY (NEEDLE) ×9 IMPLANT
NEEDLE FILTER BLUNT 18X 1/2SAF (NEEDLE) ×2
NEEDLE FILTER BLUNT 18X1 1/2 (NEEDLE) ×1 IMPLANT
NEEDLE HYPO 30X.5 LL (NEEDLE) ×6 IMPLANT
NEEDLE PRECISIONGLIDE 27X1.5 (NEEDLE) IMPLANT
NS IRRIG 1000ML POUR BTL (IV SOLUTION) ×3 IMPLANT
PACK VITRECTOMY CUSTOM (CUSTOM PROCEDURE TRAY) ×3 IMPLANT
PAD ARMBOARD 7.5X6 YLW CONV (MISCELLANEOUS) ×6 IMPLANT
PAK PIK VITRECTOMY CVS 25GA (OPHTHALMIC) ×3 IMPLANT
PENCIL BIPOLAR 25GA STR DISP (OPHTHALMIC RELATED) IMPLANT
PICK MICROPICK 25G (MISCELLANEOUS) IMPLANT
PROBE ENDO DIATHERMY 25G (MISCELLANEOUS) IMPLANT
PROBE LASER ILLUM FLEX CVD 25G (OPHTHALMIC) ×2 IMPLANT
REPL STRA BRUSH NDL (NEEDLE) IMPLANT
REPL STRA BRUSH NEEDLE (NEEDLE) IMPLANT
RESERVOIR BACK FLUSH (MISCELLANEOUS) IMPLANT
RETRACTOR IRIS FLEX 25G GRIESH (INSTRUMENTS) IMPLANT
SCISSORS TIP ADVANCED DSP 25GA (INSTRUMENTS) IMPLANT
SET INJECTOR OIL FLUID CONSTEL (OPHTHALMIC) IMPLANT
SPONGE SURGIFOAM ABS GEL 12-7 (HEMOSTASIS) IMPLANT
STOPCOCK 4 WAY LG BORE MALE ST (IV SETS) IMPLANT
SUT VICRYL 7 0 TG140 8 (SUTURE) ×3 IMPLANT
SYR 10ML LL (SYRINGE) ×3 IMPLANT
SYR 20ML LL LF (SYRINGE) ×3 IMPLANT
SYR 5ML LL (SYRINGE) ×3 IMPLANT
SYR TB 1ML LUER SLIP (SYRINGE) ×6 IMPLANT
TOWEL GREEN STERILE FF (TOWEL DISPOSABLE) ×3 IMPLANT
TUBING HIGH PRESS EXTEN 6IN (TUBING) ×3 IMPLANT
WATER STERILE IRR 1000ML POUR (IV SOLUTION) ×3 IMPLANT

## 2021-02-17 NOTE — Anesthesia Preprocedure Evaluation (Addendum)
Anesthesia Evaluation  Patient identified by MRN, date of birth, ID band Patient awake    Reviewed: Allergy & Precautions, NPO status , Patient's Chart, lab work & pertinent test results  History of Anesthesia Complications Negative for: history of anesthetic complications  Airway Mallampati: II  TM Distance: >3 FB Neck ROM: Full    Dental  (+) Teeth Intact, Dental Advisory Given   Pulmonary former smoker,    Pulmonary exam normal breath sounds clear to auscultation       Cardiovascular hypertension, Pt. on medications Normal cardiovascular exam Rhythm:Regular Rate:Normal     Neuro/Psych CVA (@80yo ), No Residual Symptoms negative psych ROS   GI/Hepatic Neg liver ROS, GERD  Controlled,  Endo/Other  diabetes, Well Controlled, Type 2, Insulin Dependenta1c 6.2 FS 118 in preop, took 4 units this AM when FS was 105  Renal/GU negative Renal ROS  negative genitourinary   Musculoskeletal negative musculoskeletal ROS (+)   Abdominal   Peds  Hematology negative hematology ROS (+)   Anesthesia Other Findings   Reproductive/Obstetrics negative OB ROS                            Anesthesia Physical Anesthesia Plan  ASA: 2  Anesthesia Plan: General   Post-op Pain Management: Tylenol PO (pre-op)   Induction: Intravenous  PONV Risk Score and Plan: 3 and Ondansetron, Dexamethasone and Treatment may vary due to age or medical condition  Airway Management Planned: Oral ETT  Additional Equipment: None  Intra-op Plan:   Post-operative Plan: Extubation in OR  Informed Consent: I have reviewed the patients History and Physical, chart, labs and discussed the procedure including the risks, benefits and alternatives for the proposed anesthesia with the patient or authorized representative who has indicated his/her understanding and acceptance.     Dental advisory given  Plan Discussed with:  CRNA  Anesthesia Plan Comments:        Anesthesia Quick Evaluation

## 2021-02-17 NOTE — Op Note (Signed)
Date of procedure: 3.12.20   Surgeon: Bernarda Caffey, MD, PhD   Assistant: Ernest Mallick, Ophthalmic Assistant   Pre-operative Diagnoses:  1. Non-clearing vitreous hemorrhage, Right Eye   Post-operative diagnosis:  1. Non-clearing vitreous hemorrhage, Right Eye 2. Peripheral retinal ischemia, Right Eye   Anesthesia: GETA   Procedure: 1.  25 gauge pars plana vitrectomy, Right Eye CPT 67140  2.  Endolaser, Right Eye  Complications: none Estimated blood loss: minimal Specimens: none   Brief history:  The patient has a history of decreased vision in the affected eye, and on examination, was noted to have a dense, nonclearing vitreous hemorrhage, affecting activities of daily living.  The risks, benefits, and alternatives were explained to the patient, including pain, bleeding, infection, loss of vision, double vision, droopy eyelids, and need for more surgeries.  Informed consent was obtained from the patient and placed in the chart.     Procedure: The patient was brought to the preoperative holding area where the correct eye was confirmed and marked.  The patient was then brought to the operating room where general endotracheal anesthesia was induced. A secondary time-out was performed to identify the correct patient, eye, procedure, and any allergies. The eye was prepped and draped in the usual sterile ophthalmic fashion followed by placement of a lid speculum.  A 25 gauge trocar was placed in the inferotemporal quadrant in a beveled fashion. A 4 mm infusion cannula was placed through this trocar. The infusion cannula was confirmed in the vitreous cavity with no incarceration of retina or choroid prior to turning it on. Two additional 25 gauge trocars were placed in the superonasal and superotemporal quadrants (2 and 10 oclock, respectively) in a similar beveled fashion. First, the anterior vitreous and hyaloid was removed via direct visualization using the vitreous cutter. Then, a standard  three-port pars plana vitrectomy was performed using the light pipe, the cutter, and the BIOM viewing system. A thorough core and peripheral vitreous dissection was performed. Of note, there was a dense, vitreous hemorrhage and significant preretinal heme obscuring the posterior pole. A posterior vitreous detachment was confirmed over the optic nerve with the assistance of kenalog. On inspection of the retina, there was significant ischemia and sclerosis of peripheral blood vessels in the temporal hemisphere suggestive of retinal ischemia / possible RVO OD. Using scleral depression, the blood stained vitreous base was carefully removed. There were no retinal breaks or tears upon inpection of the peripheral retina with 360 scleral depression.     Segmental panretinal photocoagulation was applied to the temporal retina via endolaser to the areas of ischemia. In addition, prophylactic peripheral laser retinopexy was administered to the remaining 360 of peripheral retina. After completion of these maneuvers, the posterior pole and peripheral retina were noted to be flat and the vitreous hemorrhage was cleared.  At this time, the trocars were removed and sutured with 7-0 vicryl in an interrupted fashion. Following closure, the globe was confirmed to be at physiologic pressure. Subconjunctival injections of Antibiotic and kenalog were administered. The lid speculum and drapes were removed. Drops of an antibiotic, antihypertensives, and steroid were given. The eye was patched and shielded. The patient tolerated the procedure well without any intraoperative or immediate postoperative complications. The patient was taken to the recovery room in good condition. The patient was instructed to maintain a head-upright position and will be seen by Dr. Coralyn Pear tomorrow morning in clinic.

## 2021-02-17 NOTE — Progress Notes (Signed)
[facility] Note  02/18/2021     CHIEF COMPLAINT Patient presents for Post-op Follow-up   HISTORY OF PRESENT ILLNESS: Sandra Washington is a 80 y.o. female who presents to the clinic today for:   HPI     Post-op Follow-up   In right eye.  Discomfort includes foreign body sensation.  Negative for pain, itching, tearing, discharge, floaters and none.  Vision is improved.  I, the attending physician,  performed the HPI with the patient and updated documentation appropriately.        Comments   80 y/o female pt here for 1 day POV s/p 25g PPV w/EL OD for VH.  Slept great last night.  Only minor pain immediately after sx, but calmed down w/use of OTC Tylenol.  Denies FOL, floaters.  No gtts.  Patch removed in office this a.m.  Pt reports she can already see much better OD.  No changes OS.      Last edited by Bernarda Caffey, MD on 02/18/2021  8:51 AM.     Pt states vision has improved since sx yesterday, she denies eye pain  Referring physician: Lucianne Lei, MD [address] [place],  [place] [address]  HISTORICAL INFORMATION:   Selected notes from the MEDICAL RECORD NUMBER Referred by Dr. Madelin Headings for concern of diabetic retinopathy   CURRENT MEDICATIONS: No current outpatient medications on file. (Ophthalmic Drugs)   No current facility-administered medications for this visit. (Ophthalmic Drugs)   Current Outpatient Medications (Other)  Medication Sig   ACCU-CHEK SOFTCLIX LANCETS lancets Use bid   acetaminophen (TYLENOL) 500 MG tablet Take 500-1,000 mg by mouth every 6 (six) hours as needed (for back aches).   amLODipine (NORVASC) 5 MG tablet Take 5 mg by mouth 2 (two) times daily.   atorvastatin (LIPITOR) 20 MG tablet Take 1 tablet (20 mg total) by mouth daily. (Patient taking differently: Take 20 mg by mouth at bedtime.)   Blood Glucose Monitoring Suppl (ACCU-CHEK GUIDE ME) w/Device KIT 1 each by Does not apply route 2 (two) times  daily. Use accu chek guide me device to check blood sugar twice daily.DX:E11.65   Cholecalciferol (VITAMIN D3) 5000 UNITS CAPS Take 5,000 Units by mouth daily.   glucose blood (ACCU-CHEK GUIDE) test strip 1 each by Other route 2 (two) times daily. Use as instructed   insulin NPH Human (NOVOLIN N RELION) 100 UNIT/ML injection 8 U in am and 12 U hs (Patient taking differently: Inject 8-10 Units into the skin See admin instructions. Takes 8 units in the morning and 10 units at night)   insulin regular (NOVOLIN R RELION) 100 units/mL injection INJECT 8 UNITS SUBCUTANEOUSLY WITH BREAKFAST AND LUNCH, AND 16 UNITS WITH DINNER (Patient taking differently: Inject 8-16 Units into the skin See admin instructions. Takes 8 units  with breakfast and lunch, and 12 units with dinner)   meclizine (ANTIVERT) 25 MG tablet Take 25 mg by mouth 3 (three) times daily as needed for dizziness.   niacin (SLO-NIACIN) 500 MG tablet Take 500 mg by mouth at bedtime.   omeprazole (PRILOSEC) 40 MG capsule Take 40 mg by mouth daily.   ondansetron (ZOFRAN) 4 MG tablet Take 1 tablet (4 mg total) by mouth every 6 (six) hours as needed for nausea.   valsartan-hydrochlorothiazide (DIOVAN-HCT) 160-12.5 MG tablet Take 1 tablet by mouth in the morning.   No current facility-administered medications for this visit. (Other)   REVIEW OF SYSTEMS: ROS  Positive for: Gastrointestinal, Neurological, Endocrine, Eyes Negative for: Constitutional, Skin, Genitourinary, Musculoskeletal, HENT, Cardiovascular, Respiratory, Psychiatric, Allergic/Imm, Heme/Lymph Last edited by Matthew Folks, COA on 02/18/2021  7:55 AM.     ALLERGIES No Known Allergies  PAST MEDICAL HISTORY Past Medical History:  Diagnosis Date   Complication of anesthesia    Diabetes mellitus without complication (HCC)    Diabetic retinopathy ([place])    GERD (gastroesophageal reflux disease)    History of kidney stones    Hypertension    Hypertensive retinopathy     Stroke [facility])    age 15- no residual   Past Surgical History:  Procedure Laterality Date   BREAST BIOPSY Left    CATARACT EXTRACTION Bilateral 2018   Dr. Tommy Rainwater   CHOLECYSTECTOMY N/A 07/01/2020   Procedure: LAPAROSCOPIC CHOLECYSTECTOMY;  Surgeon: Virl Cagey, MD;  Location: [facility];  Service: General;  Laterality: N/A;   EYE SURGERY     HAND SURGERY     IR RADIOLOGIST EVAL & MGMT  08/05/2020   NO PAST SURGERIES     FAMILY HISTORY History reviewed. No pertinent family history.  SOCIAL HISTORY Social History   Tobacco Use   Smoking status: Former   Smokeless tobacco: Never   Tobacco comments:    Smoked in [employer]  Vaping Use   Vaping Use: Former  Substance Use Topics   Alcohol use: No   Drug use: No       OPHTHALMIC EXAM:  Base Eye Exam     Visual Acuity (Snellen - Linear)       Right Left   Dist [place] 20/200 -    Dist ph [place] 20/30          Tonometry (Tonopen, 7:58 AM)       Right Left   Pressure 12 13         Pupils       Dark Light Shape React APD   Right 5 5 Round Minimal None   Left 3 2 Round Brisk None         Visual Fields (Counting fingers)       Left Right    Full Full         Extraocular Movement       Right Left    Full Full         Neuro/Psych     Oriented x3: Yes   Mood/Affect: Normal         Dilation     Right eye: 1.0% Mydriacyl, 2.5% Phenylephrine @ 7:58 AM           Slit Lamp and Fundus Exam     Slit Lamp Exam       Right Left   Lids/Lashes Dermatochalasis - upper lid, Meibomian gland dysfunction Dermatochalasis - upper lid, Meibomian gland dysfunction   Conjunctiva/Sclera Subconjunctival hemorrhage, sutures intact nasal and temporal Pinguecula, Melanosis   Cornea Arcus, trace Punctate epithelial erosions, Well healed temporal cataract wounds, 1-2+ Descemet's folds Arcus, 1+ Punctate epithelial erosions, Well healed temporal cataract wounds, mild EBMD   Anterior Chamber deep, clear, narrow  temporal angle, No cell or flare Deep and quiet   Iris Round and moderately dilated, focal iris atrophy at 1100  --  PI not open, No NVI Round and moderately dilated, small patent PI at 0200, mild bowing , No NVI   Lens PC IOL in good position with open PC PC IOL in good position with open PC   Anterior Vitreous post  vitrectomy, VH cleared Vitreous syneresis         Fundus Exam       Right Left   Disc Pink and Sharp    C/D Ratio 0.2 0.2   Macula Flat, Blunted foveal reflex, mild RPE mottling, focal drusen SN to fovea, no edema    Vessels mild Vascular attenuation    Periphery Attached, scattered DBH, 360 peripheral laser changes             IMAGING AND PROCEDURES  Imaging and Procedures for '@TODAY' @          ASSESSMENT/PLAN:    ICD-10-CM   1. Moderate nonproliferative diabetic retinopathy of both eyes without macular edema associated with type 2 diabetes mellitus ([place])  E95.2841     2. Vitreous hemorrhage of right eye ([place])  H43.11     3. Essential hypertension  I10     4. Hypertensive retinopathy of both eyes  H35.033     5. Pseudophakia of both eyes  Z96.1     6. Anatomical narrow angle  H40.039      Vitreous Hemorrhage OD - onset of floaters and vision loss beginning of Dec 2022 while putting up Christmas decorations - likely related to history of DM and HTN - pt also recalls multiple falls from vertigo around the same time of VH onset - s/p IVA OD #1 (12.08.22) - no significant improvement - pre op BCVA OD stable at CF 1'  - now POD1 s/p PPV/EL 12.22.22 OD - intra-op -- visualized peripheral ischemia and scattered DBH - doing well this morning             - IOP 12             - start   PF 4x/day OD                         zymaxid QID OD                          Atropine BID OD                          PSO ung QID OD             - eye shield when sleeping              - post op drop and positioning instructions reviewed              -  tylenol/ibuprofen for pain  - f/u Thursday, December 29, DFE, OCT  2. Moderate nonproliferative diabetic retinopathy w/o DME, OU  - exam with scattered MA OU -- OD w/ VH as above  - BCVA 20/25 OS  - OCT OS: Mild persistent cystic changes superior fovea  - discussed findings, prognosis  - recommend continued monitoring   3,4. Hypertensive retinopathy OU  - discussed importance of tight BP control  - monitor   5. Pseudophakia OU  - s/p CE/IOL OU  - beautiful surgeries by Dr. Talbert Forest, doing well  - monitor   6. Anatomical Narrow Angles OU  - s/p LPI OU by Dr. Talbert Forest  - OS patent LPI and open angle  - OD w/ closed PI  - saw Dr. Nancy Fetter on 09.21.20--no intervention recommended   Ophthalmic Meds Ordered this visit:  No orders of the defined types were placed in this encounter.  Return in about 6 days (around 02/24/2021) for f/u VH OD, DFE, OCT.  There are no Patient Instructions on file for this visit.   Explained the diagnoses, plan, and follow up with the patient and they expressed understanding.  Patient expressed understanding of the importance of proper follow up care.   This document serves as a record of services personally performed by Gardiner Sleeper, MD, PhD. It was created on their behalf by Leonie Douglas, an ophthalmic technician. The creation of this record is the provider's dictation and/or activities during the visit.    Electronically signed by: Leonie Douglas COA, 02/18/21  8:55 AM  This document serves as a record of services personally performed by Gardiner Sleeper, MD, PhD. It was created on their behalf by San Jetty. Owens Shark, OA an ophthalmic technician. The creation of this record is the provider's dictation and/or activities during the visit.    Electronically signed by: San Jetty. Marguerita Merles 12.23.2022 8:55 AM  Gardiner Sleeper, M.D., Ph.D. Diseases & Surgery of the Retina and [place] 02/18/2021  I have reviewed the above  documentation for accuracy and completeness, and I agree with the above. Gardiner Sleeper, M.D., Ph.D. 02/18/21 8:55 AM   Abbreviations: M myopia (nearsighted); A astigmatism; H hyperopia (farsighted); P presbyopia; Mrx spectacle prescription;  CTL contact lenses; OD right eye; OS left eye; OU both eyes  XT exotropia; ET esotropia; PEK punctate epithelial keratitis; PEE punctate epithelial erosions; DES dry eye syndrome; MGD meibomian gland dysfunction; ATs artificial tears; PFAT's preservative free artificial tears; [place] nuclear sclerotic cataract; PSC posterior subcapsular cataract; ERM epi-retinal membrane; PVD posterior vitreous detachment; RD retinal detachment; DM diabetes mellitus; DR diabetic retinopathy; NPDR non-proliferative diabetic retinopathy; PDR proliferative diabetic retinopathy; CSME clinically significant macular edema; DME diabetic macular edema; dbh dot blot hemorrhages; CWS cotton wool spot; POAG primary open angle glaucoma; C/D cup-to-disc ratio; HVF humphrey visual field; GVF goldmann visual field; OCT optical coherence tomography; IOP intraocular pressure; BRVO Branch retinal vein occlusion; CRVO central retinal vein occlusion; CRAO central retinal artery occlusion; BRAO branch retinal artery occlusion; RT retinal tear; SB scleral buckle; PPV pars plana vitrectomy; VH Vitreous hemorrhage; PRP panretinal laser photocoagulation; IVK intravitreal kenalog; VMT vitreomacular traction; MH Macular hole;  NVD neovascularization of the disc; NVE neovascularization elsewhere; AREDS age related eye disease study; ARMD age related macular degeneration; POAG primary open angle glaucoma; EBMD epithelial/anterior basement membrane dystrophy; ACIOL anterior chamber intraocular lens; IOL intraocular lens; PCIOL posterior chamber intraocular lens; Phaco/IOL phacoemulsification with intraocular lens placement; [place] photorefractive keratectomy; LASIK laser assisted in situ keratomileusis; HTN hypertension; DM  diabetes mellitus; COPD chronic obstructive pulmonary disease

## 2021-02-17 NOTE — Interval H&P Note (Signed)
History and Physical Interval Note:  02/17/2021 11:36 AM  [name]  has presented today for surgery, with the diagnosis of vitreous hemorrhage, right eye.  The various methods of treatment have been discussed with the patient and family. After consideration of risks, benefits and other options for treatment, the patient has consented to  Procedure(s): PARS PLANA VITRECTOMY WITH 25 GAUGE (Right) as a surgical intervention.  The patient's history has been reviewed, patient examined, no change in status, stable for surgery.  I have reviewed the patient's chart and labs.  Questions were answered to the patient's satisfaction.     Bernarda Caffey

## 2021-02-17 NOTE — Brief Op Note (Signed)
02/17/2021  1:27 PM  PATIENT:  Sandra Washington  80 y.o. female  PRE-OPERATIVE DIAGNOSIS:  vitreous hemorrhage, right eye  POST-OPERATIVE DIAGNOSIS:  vitreous hemorrhage, right eye  PROCEDURE:  Procedure(s): PARS PLANA VITRECTOMY WITH 25 GAUGE (Right) PHOTOCOAGULATION WITH LASER (Right)  SURGEON:  Surgeon(s) and Role:    Bernarda Caffey, MD - Primary  ASSISTANTS: Ernest Mallick, Ophthalmic Assistant    ANESTHESIA:   general  EBL:  0 mL   BLOOD ADMINISTERED:none  DRAINS: none   LOCAL MEDICATIONS USED:  NONE  SPECIMEN:  No Specimen  DISPOSITION OF SPECIMEN:  N/A  COUNTS:  YES  TOURNIQUET:  * No tourniquets in log *  DICTATION: .Note written in EPIC  PLAN OF CARE: Discharge to home after PACU  PATIENT DISPOSITION:  PACU - hemodynamically stable.   Delay start of Pharmacological VTE agent (>24hrs) due to surgical blood loss or risk of bleeding: not applicable

## 2021-02-17 NOTE — Anesthesia Procedure Notes (Signed)
Procedure Name: Intubation Date/Time: 02/17/2021 11:51 AM Performed by: Leonor Liv, CRNA Pre-anesthesia Checklist: Patient identified, Emergency Drugs available, Suction available, Timeout performed and Patient being monitored Patient Re-evaluated:Patient Re-evaluated prior to induction Oxygen Delivery Method: Circle system utilized Preoxygenation: Pre-oxygenation with 100% oxygen Induction Type: IV induction Ventilation: Mask ventilation without difficulty Laryngoscope Size: Mac and 3 Grade View: Grade I Tube size: 7.0 mm Number of attempts: 1 Airway Equipment and Method: Stylet Placement Confirmation: ETT inserted through vocal cords under direct vision, positive ETCO2 and breath sounds checked- equal and bilateral Secured at: 21 cm Tube secured with: Tape (left) Dental Injury: Teeth and Oropharynx as per pre-operative assessment

## 2021-02-17 NOTE — Discharge Instructions (Addendum)
POSTOPERATIVE INSTRUCTIONS  Your doctor has performed vitreoretinal surgery on you at [facility].  - Keep eye patched and shielded until seen by Dr. Oluwasemilore Pascuzzi 8 AM tomorrow in clinic - Do not use drops until return - Sleep with head elevated 30-45 degrees   - No strenuous bending, stooping or lifting.  - You may not drive until further notice.  - Tylenol or any other over-the-counter pain reliever can be used according to your doctor. If more pain medicine is required, your doctor will have a prescription for you.  - You may read, go up and down stairs, and watch television.     Darron Stuck, M.D., Ph.D.  

## 2021-02-17 NOTE — Transfer of Care (Signed)
Immediate Anesthesia Transfer of Care Note  Patient: Sandra Washington  Procedure(s) Performed: PARS PLANA VITRECTOMY WITH 25 GAUGE (Right: Eye) PHOTOCOAGULATION WITH LASER (Right: Eye)  Patient Location: PACU  Anesthesia Type:General  Level of Consciousness: drowsy  Airway & Oxygen Therapy: Patient Spontanous Breathing and Patient connected to nasal cannula oxygen  Post-op Assessment: Report given to RN and Post -op Vital signs reviewed and stable  Post vital signs: Reviewed and stable  Last Vitals:  Vitals Value Taken Time  BP 152/65 02/17/21 1329  Temp    Pulse 76 02/17/21 1331  Resp 21 02/17/21 1331  SpO2 99 % 02/17/21 1331  Vitals shown include unvalidated device data.  Last Pain:  Vitals:   02/17/21 0944  TempSrc:   PainSc: 0-No pain      Patients Stated Pain Goal: 1 (92/42/68 [id number])  Complications: No notable events documented.

## 2021-02-18 ENCOUNTER — Ambulatory Visit (INDEPENDENT_AMBULATORY_CARE_PROVIDER_SITE_OTHER): Payer: Medicare HMO | Admitting: Ophthalmology

## 2021-02-18 ENCOUNTER — Encounter (INDEPENDENT_AMBULATORY_CARE_PROVIDER_SITE_OTHER): Payer: Self-pay | Admitting: Ophthalmology

## 2021-02-18 DIAGNOSIS — I10 Essential (primary) hypertension: Secondary | ICD-10-CM

## 2021-02-18 DIAGNOSIS — H4311 Vitreous hemorrhage, right eye: Secondary | ICD-10-CM

## 2021-02-18 DIAGNOSIS — E113393 Type 2 diabetes mellitus with moderate nonproliferative diabetic retinopathy without macular edema, bilateral: Secondary | ICD-10-CM

## 2021-02-18 DIAGNOSIS — H35033 Hypertensive retinopathy, bilateral: Secondary | ICD-10-CM

## 2021-02-18 DIAGNOSIS — H40039 Anatomical narrow angle, unspecified eye: Secondary | ICD-10-CM

## 2021-02-18 DIAGNOSIS — Z961 Presence of intraocular lens: Secondary | ICD-10-CM

## 2021-02-18 NOTE — Anesthesia Postprocedure Evaluation (Signed)
Anesthesia Post Note  Patient: Sandra Washington  Procedure(s) Performed: PARS PLANA VITRECTOMY WITH 25 GAUGE (Right: Eye) PHOTOCOAGULATION WITH LASER (Right: Eye)     Patient location during evaluation: PACU Anesthesia Type: General Level of consciousness: awake and alert, oriented and patient cooperative Pain management: pain level controlled Vital Signs Assessment: post-procedure vital signs reviewed and stable Respiratory status: spontaneous breathing, nonlabored ventilation and respiratory function stable Cardiovascular status: blood pressure returned to baseline and stable Postop Assessment: no apparent nausea or vomiting Anesthetic complications: no   No notable events documented.  Last Vitals:  Vitals:   02/17/21 1345 02/17/21 1400  BP: (!) 128/56 111/73  Pulse: 64 74  Resp: 14 17  Temp:  (!) 36.1 C  SpO2: 96% 97%    Last Pain:  Vitals:   02/17/21 1400  TempSrc:   PainSc: [place]

## 2021-02-23 ENCOUNTER — Ambulatory Visit (HOSPITAL_COMMUNITY)
Admission: RE | Admit: 2021-02-23 | Discharge: 2021-02-23 | Disposition: A | Discharge status: Home / Self Care | Payer: Medicare HMO | Source: Ambulatory Visit | Attending: Family Medicine | Admitting: Family Medicine

## 2021-02-23 ENCOUNTER — Other Ambulatory Visit: Payer: Self-pay

## 2021-02-23 DIAGNOSIS — Z1231 Encounter for screening mammogram for malignant neoplasm of breast: Secondary | ICD-10-CM | POA: Insufficient documentation

## 2021-02-23 NOTE — Progress Notes (Signed)
[facility] Note  02/24/2021    CHIEF COMPLAINT Patient presents for Post-op Follow-up   HISTORY OF PRESENT ILLNESS: Sandra Washington is a 80 y.o. female who presents to the clinic today for:   HPI     Post-op Follow-up   In right eye.  Discomfort includes floaters.  Negative for pain, itching, foreign body sensation, tearing, discharge and none.  Vision is improved.  I, the attending physician,  performed the HPI with the patient and updated documentation appropriately.        Comments   Patient states vision improving OD. Using gtts/ung as instructed per Dr. Coralyn Pear. BS was 81 this am. A1c was 6.2, July 2022.       Last edited by Bernarda Caffey, MD on 02/24/2021 10:36 AM.    Pt states she is doing well, VA is improving, she is seeing a few floaters  Referring physician: Lucianne Lei, [address] [place],  [place] [address]  HISTORICAL INFORMATION:   Selected notes from the MEDICAL RECORD NUMBER Referred by Dr. Madelin Headings for concern of diabetic retinopathy   CURRENT MEDICATIONS: Current Outpatient Medications (Ophthalmic Drugs)  Medication Sig   atropine 1 % ophthalmic solution Place 1 drop into the right eye in the morning and at bedtime.   gatifloxacin (ZYMAXID) 0.5 % SOLN Place 1 drop into the right eye 4 (four) times daily.   bacitracin-polymyxin b (POLYSPORIN) ophthalmic ointment Place 1 application into the right eye at bedtime.   prednisoLONE acetate (PRED FORTE) 1 % ophthalmic suspension Place 1 drop into the right eye 3 (three) times daily.   No current facility-administered medications for this visit. (Ophthalmic Drugs)   Current Outpatient Medications (Other)  Medication Sig   ACCU-CHEK SOFTCLIX LANCETS lancets Use bid   acetaminophen (TYLENOL) 500 MG tablet Take 500-1,000 mg by mouth every 6 (six) hours as needed (for back aches).   amLODipine (NORVASC) 5 MG tablet Take 5 mg by mouth 2 (two) times daily.    atorvastatin (LIPITOR) 20 MG tablet Take 1 tablet (20 mg total) by mouth daily. (Patient taking differently: Take 20 mg by mouth at bedtime.)   Blood Glucose Monitoring Suppl (ACCU-CHEK GUIDE ME) w/Device KIT 1 each by Does not apply route 2 (two) times daily. Use accu chek guide me device to check blood sugar twice daily.DX:E11.65   Cholecalciferol (VITAMIN D3) 5000 UNITS CAPS Take 5,000 Units by mouth daily.   glucose blood (ACCU-CHEK GUIDE) test strip 1 each by Other route 2 (two) times daily. Use as instructed   insulin NPH Human (NOVOLIN N RELION) 100 UNIT/ML injection 8 U in am and 12 U hs (Patient taking differently: Inject 8-10 Units into the skin See admin instructions. Takes 8 units in the morning and 10 units at night)   insulin regular (NOVOLIN R RELION) 100 units/mL injection INJECT 8 UNITS SUBCUTANEOUSLY WITH BREAKFAST AND LUNCH, AND 16 UNITS WITH DINNER (Patient taking differently: Inject 8-16 Units into the skin See admin instructions. Takes 8 units  with breakfast and lunch, and 12 units with dinner)   meclizine (ANTIVERT) 25 MG tablet Take 25 mg by mouth 3 (three) times daily as needed for dizziness.   niacin (SLO-NIACIN) 500 MG tablet Take 500 mg by mouth at bedtime.   omeprazole (PRILOSEC) 40 MG capsule Take 40 mg by mouth daily.   ondansetron (ZOFRAN) 4 MG tablet Take 1 tablet (4 mg total) by mouth every 6 (six) hours as  needed for nausea.   valsartan-hydrochlorothiazide (DIOVAN-HCT) 160-12.5 MG tablet Take 1 tablet by mouth in the morning.   No current facility-administered medications for this visit. (Other)   REVIEW OF SYSTEMS: ROS   Positive for: Gastrointestinal, Neurological, Endocrine, Eyes Negative for: Constitutional, Skin, Genitourinary, Musculoskeletal, HENT, Cardiovascular, Respiratory, Psychiatric, Allergic/Imm, Heme/Lymph Last edited by Jobe Marker, COT on 02/24/2021  7:50 AM.     ALLERGIES No Known Allergies  PAST MEDICAL HISTORY Past Medical History:   Diagnosis Date   Complication of anesthesia    Diabetes mellitus without complication ([place])    Diabetic retinopathy ([place])    GERD (gastroesophageal reflux disease)    History of kidney stones    Hypertension    Hypertensive retinopathy    Stroke [facility])    age 79- no residual   Past Surgical History:  Procedure Laterality Date   BREAST BIOPSY Left    CATARACT EXTRACTION Bilateral 2018   Dr. Tommy Rainwater   CHOLECYSTECTOMY N/A 07/01/2020   Procedure: LAPAROSCOPIC CHOLECYSTECTOMY;  Surgeon: Virl Cagey, MD;  Location: [facility];  Service: General;  Laterality: N/A;   EYE SURGERY     HAND SURGERY     IR RADIOLOGIST EVAL & MGMT  08/05/2020   NO PAST SURGERIES     PARS PLANA VITRECTOMY Right 02/17/2021   Procedure: PARS PLANA VITRECTOMY WITH 25 GAUGE;  Surgeon: Bernarda Caffey, MD;  Location: [place];  Service: Ophthalmology;  Laterality: Right;   PHOTOCOAGULATION WITH LASER Right 02/17/2021   Procedure: PHOTOCOAGULATION WITH LASER;  Surgeon: Bernarda Caffey, MD;  Location: [place];  Service: Ophthalmology;  Laterality: Right;   FAMILY HISTORY History reviewed. No pertinent family history.  SOCIAL HISTORY Social History   Tobacco Use   Smoking status: Former   Smokeless tobacco: Never   Tobacco comments:    Smoked in [employer]  Vaping Use   Vaping Use: Former  Substance Use Topics   Alcohol use: No   Drug use: No       OPHTHALMIC EXAM:  Base Eye Exam     Visual Acuity (Snellen - Linear)       Right Left   Dist cc 20/40 -1 20/30 +2   Dist ph cc 20/30 -2 NI    Correction: Glasses         Tonometry (Tonopen, 7:54 AM)       Right Left   Pressure 12 15         Pupils       Dark Light Shape React APD   Right 7 7 Round None None   Left 2 1 Round Minimal None         Visual Fields       Left Right    Full Full         Extraocular Movement       Right Left    Full, Ortho Full, Ortho         Neuro/Psych     Oriented x3: Yes   Mood/Affect:  Normal         Dilation     Right eye: 1.0% Mydriacyl, 2.5% Phenylephrine @ 7:54 AM           Slit Lamp and Fundus Exam     Slit Lamp Exam       Right Left   Lids/Lashes Dermatochalasis - upper lid, Meibomian gland dysfunction Dermatochalasis - upper lid, Meibomian gland dysfunction   Conjunctiva/Sclera Subconjunctival hemorrhage inferiorly, sutures intact nasal and temporal Pinguecula,  Melanosis   Cornea Arcus, Well healed temporal cataract wounds, mild tear film debris Arcus, 1+ Punctate epithelial erosions, Well healed temporal cataract wounds, mild EBMD   Anterior Chamber deep, clear, narrow temporal angle, No cell or flare Deep and quiet   Iris Round and moderately dilated, focal iris atrophy at 1100  --  PI not open, No NVI Round and moderately dilated, small patent PI at 0200, mild bowing , No NVI   Lens PC IOL in good position with open PC PC IOL in good position with open PC   Anterior Vitreous post vitrectomy, VH cleared Vitreous syneresis         Fundus Exam       Right Left   Disc Pink and Sharp    C/D Ratio 0.2 0.2   Macula Flat, Blunted foveal reflex, mild RPE mottling, focal drusen SN to fovea, no edema    Vessels mild Vascular attenuation    Periphery Attached, scattered MA, 360 peripheral laser changes greatest temporally            IMAGING AND PROCEDURES  Imaging and Procedures for _0 @  OCT, Retina - OU - Both Eyes       Right Eye Quality was good. Central Foveal Thickness: 260. Progression has improved. Findings include normal foveal contour, no IRF, no SRF (Trace cystic changes superior fovea and macula, interval improvement in vitreous opacities).   Left Eye Quality was good. Central Foveal Thickness: 260. Progression has been stable. Findings include no SRF, intraretinal hyper-reflective material, intraretinal fluid, abnormal foveal contour (Persistent cystic changes superior fovea).   Notes *Images captured and stored on  drive  Diagnosis / Impression:  OD: Trace cystic changes superior fovea and macula, interval improvement in vitreous opacities OS: persistent cystic changes superior fovea   Clinical management:  See below  Abbreviations: NFP - Normal foveal profile. CME - cystoid macular edema. PED - pigment epithelial detachment. IRF - intraretinal fluid. SRF - subretinal fluid. EZ - ellipsoid zone. ERM - epiretinal membrane. ORA - outer retinal atrophy. ORT - outer retinal tubulation. SRHM - subretinal hyper-reflective material            ASSESSMENT/PLAN:    ICD-10-CM   1. Moderate nonproliferative diabetic retinopathy of both eyes without macular edema associated with type 2 diabetes mellitus (HCC)  O87.8676 OCT, Retina - OU - Both Eyes    2. Vitreous hemorrhage of right eye ([place])  H43.11     3. Essential hypertension  I10     4. Hypertensive retinopathy of both eyes  H35.033     5. Pseudophakia of both eyes  Z96.1     6. Anatomical narrow angle  H40.039      Vitreous Hemorrhage OD - onset of floaters and vision loss beginning of Dec 2022 while putting up Christmas decorations - likely related to history of DM and HTN - pt also recalls multiple falls from vertigo around the same time of VH onset - s/p IVA OD #1 (12.08.22) - no significant improvement - pre op BCVA OD -- CF 1'  - now POW1 s/p PPV/EL 12.22.22 OD - intra-op -- visualized peripheral ischemia and scattered DBH; no RT/RD - doing great - BCVA 20/30 OD from CF             - IOP 12 - start  PF taper 3,2,1 drops daily, decrease weekly - cont zymaxid QID OD -- okay to stop on Sunday, 01.01.23 PSO ung QID OD -- decrease to QHS/PRN -  stop Atropine BID OD             - eye shield when sleeping x1 more week             - post op drop and positioning instructions reviewed              - tylenol/ibuprofen for pain  - f/u 3-4 weeks, DFE, OCT  2. Moderate nonproliferative diabetic retinopathy w/o DME, OU  - exam with  scattered MA OU -- OD w/ VH as above  - BCVA 20/25 OS  - OCT OS: Mild persistent cystic changes superior fovea  - discussed findings, prognosis  - recommend continued monitoring   3,4. Hypertensive retinopathy OU  - discussed importance of tight BP control  - monitor   5. Pseudophakia OU  - s/p CE/IOL OU  - beautiful surgeries by Dr. Talbert Forest, doing well  - monitor   6. Anatomical Narrow Angles OU  - s/p LPI OU by Dr. Talbert Forest  - OS patent LPI and open angle  - OD w/ closed PI  - saw Dr. Nancy Fetter on 09.21.20--no intervention recommended   Ophthalmic Meds Ordered this visit:  Meds ordered this encounter  Medications   bacitracin-polymyxin b (POLYSPORIN) ophthalmic ointment    Sig: Place 1 application into the right eye at bedtime.    Dispense:  3.5 g    Refill:  3   prednisoLONE acetate (PRED FORTE) 1 % ophthalmic suspension    Sig: Place 1 drop into the right eye 3 (three) times daily.    Dispense:  10 mL    Refill:  0     Return for f/u 3-4 weeks, VH OD , DFE, OCT.  There are no Patient Instructions on file for this visit.   Explained the diagnoses, plan, and follow up with the patient and they expressed understanding.  Patient expressed understanding of the importance of proper follow up care.   This document serves as a record of services personally performed by Gardiner Sleeper, MD, PhD. It was created on their behalf by San Jetty. Owens Shark, OA an ophthalmic technician. The creation of this record is the provider's dictation and/or activities during the visit.    Electronically signed by: San Jetty. Marguerita Merles 12.28.2022 10:37 AM  Gardiner Sleeper, M.D., Ph.D. Diseases & Surgery of the Retina and Vitreous [facility]  I have reviewed the above documentation for accuracy and completeness, and I agree with the above. Gardiner Sleeper, M.D., Ph.D. 02/24/21 10:40 AM   Abbreviations: M myopia (nearsighted); A astigmatism; H hyperopia (farsighted); P presbyopia;  Mrx spectacle prescription;  CTL contact lenses; OD right eye; OS left eye; OU both eyes  XT exotropia; ET esotropia; PEK punctate epithelial keratitis; PEE punctate epithelial erosions; DES dry eye syndrome; MGD meibomian gland dysfunction; ATs artificial tears; PFAT's preservative free artificial tears; [place] nuclear sclerotic cataract; PSC posterior subcapsular cataract; ERM epi-retinal membrane; PVD posterior vitreous detachment; RD retinal detachment; DM diabetes mellitus; DR diabetic retinopathy; NPDR non-proliferative diabetic retinopathy; PDR proliferative diabetic retinopathy; CSME clinically significant macular edema; DME diabetic macular edema; dbh dot blot hemorrhages; CWS cotton wool spot; POAG primary open angle glaucoma; C/D cup-to-disc ratio; HVF humphrey visual field; GVF goldmann visual field; OCT optical coherence tomography; IOP intraocular pressure; BRVO Branch retinal vein occlusion; CRVO central retinal vein occlusion; CRAO central retinal artery occlusion; BRAO branch retinal artery occlusion; RT retinal tear; SB scleral buckle; PPV pars plana vitrectomy; VH Vitreous hemorrhage; PRP  panretinal laser photocoagulation; IVK intravitreal kenalog; VMT vitreomacular traction; MH Macular hole;  NVD neovascularization of the disc; NVE neovascularization elsewhere; AREDS age related eye disease study; ARMD age related macular degeneration; POAG primary open angle glaucoma; EBMD epithelial/anterior basement membrane dystrophy; ACIOL anterior chamber intraocular lens; IOL intraocular lens; PCIOL posterior chamber intraocular lens; Phaco/IOL phacoemulsification with intraocular lens placement; [place] photorefractive keratectomy; LASIK laser assisted in situ keratomileusis; HTN hypertension; DM diabetes mellitus; COPD chronic obstructive pulmonary disease

## 2021-02-24 ENCOUNTER — Encounter (INDEPENDENT_AMBULATORY_CARE_PROVIDER_SITE_OTHER): Payer: Self-pay | Admitting: Ophthalmology

## 2021-02-24 ENCOUNTER — Ambulatory Visit (INDEPENDENT_AMBULATORY_CARE_PROVIDER_SITE_OTHER): Payer: Medicare HMO | Admitting: Ophthalmology

## 2021-02-24 DIAGNOSIS — H35033 Hypertensive retinopathy, bilateral: Secondary | ICD-10-CM

## 2021-02-24 DIAGNOSIS — Z961 Presence of intraocular lens: Secondary | ICD-10-CM

## 2021-02-24 DIAGNOSIS — E113393 Type 2 diabetes mellitus with moderate nonproliferative diabetic retinopathy without macular edema, bilateral: Secondary | ICD-10-CM

## 2021-02-24 DIAGNOSIS — H40039 Anatomical narrow angle, unspecified eye: Secondary | ICD-10-CM

## 2021-02-24 DIAGNOSIS — I10 Essential (primary) hypertension: Secondary | ICD-10-CM

## 2021-02-24 DIAGNOSIS — H4311 Vitreous hemorrhage, right eye: Secondary | ICD-10-CM

## 2021-02-24 MED ORDER — PREDNISOLONE ACETATE 1 % OP SUSP: 1.0000 [drp] | Freq: Three times a day (TID) | OPHTHALMIC | 0 refills | Status: DC

## 2021-02-24 MED ORDER — BACITRACIN-POLYMYXIN B 500-10000 UNIT/GM OP OINT: 1.0000 "application " | TOPICAL_OINTMENT | Freq: Every day | OPHTHALMIC | 3 refills | Status: DC

## 2021-02-25 DIAGNOSIS — I10 Essential (primary) hypertension: Secondary | ICD-10-CM | POA: Diagnosis not present

## 2021-02-25 DIAGNOSIS — Z794 Long term (current) use of insulin: Secondary | ICD-10-CM | POA: Diagnosis not present

## 2021-02-25 DIAGNOSIS — E1100 Type 2 diabetes mellitus with hyperosmolarity without nonketotic hyperglycemic-hyperosmolar coma (NKHHC): Secondary | ICD-10-CM | POA: Diagnosis not present

## 2021-02-25 DIAGNOSIS — E783 Hyperchylomicronemia: Secondary | ICD-10-CM | POA: Diagnosis not present

## 2021-03-14 NOTE — Progress Notes (Signed)
[facility] Note  03/17/2021    CHIEF COMPLAINT Patient presents for Retina Follow Up   HISTORY OF PRESENT ILLNESS: Sandra Washington is a 81 y.o. female who presents to the clinic today for:   HPI     Retina Follow Up   Diagnosis: Vitreous heme.  In right eye.  Severity is moderate.  Duration of 3 weeks.  Since onset it is stable.  I, the attending physician,  performed the HPI with the patient and updated documentation appropriately.        Comments   Patient states vision improved OD. Sees tiny, clear floaters OD--occasional. Using Pred Forte bid OD and PSO ung qhs OD.       Last edited by Bernarda Caffey, MD on 03/18/2021  8:54 AM.     Pt states she still has one floater in her right eye, she states it is light and looks like a "crystal", she is using PF once a day and PSO ung at bedtime  Referring physician: Lucianne Lei, [address] [place],  [place] [address]  HISTORICAL INFORMATION:   Selected notes from the [place] Referred by Dr. Madelin Headings for concern of diabetic retinopathy   CURRENT MEDICATIONS: Current Outpatient Medications (Ophthalmic Drugs)  Medication Sig   bacitracin-polymyxin b (POLYSPORIN) ophthalmic ointment Place 1 application into the right eye at bedtime.   gatifloxacin (ZYMAXID) 0.5 % SOLN Place 1 drop into the right eye 4 (four) times daily.   prednisoLONE acetate (PRED FORTE) 1 % ophthalmic suspension Place 1 drop into the right eye 3 (three) times daily. (Patient taking differently: Place 1 drop into the right eye 2 (two) times daily.)   atropine 1 % ophthalmic solution Place 1 drop into the right eye in the morning and at bedtime. (Patient not taking: Reported on 03/17/2021)   No current facility-administered medications for this visit. (Ophthalmic Drugs)   Current Outpatient Medications (Other)  Medication Sig   ACCU-CHEK SOFTCLIX LANCETS lancets Use bid   acetaminophen (TYLENOL) 500  MG tablet Take 500-1,000 mg by mouth every 6 (six) hours as needed (for back aches).   amLODipine (NORVASC) 5 MG tablet Take 5 mg by mouth 2 (two) times daily.   atorvastatin (LIPITOR) 20 MG tablet Take 1 tablet (20 mg total) by mouth daily. (Patient taking differently: Take 20 mg by mouth at bedtime.)   Blood Glucose Monitoring Suppl (ACCU-CHEK GUIDE ME) w/Device KIT 1 each by Does not apply route 2 (two) times daily. Use accu chek guide me device to check blood sugar twice daily.DX:E11.65   Cholecalciferol (VITAMIN D3) 5000 UNITS CAPS Take 5,000 Units by mouth daily.   glucose blood (ACCU-CHEK GUIDE) test strip 1 each by Other route 2 (two) times daily. Use as instructed   insulin NPH Human (NOVOLIN N RELION) 100 UNIT/ML injection 8 U in am and 12 U hs (Patient taking differently: Inject 8-10 Units into the skin See admin instructions. Takes 8 units in the morning and 10 units at night)   insulin regular (NOVOLIN R RELION) 100 units/mL injection INJECT 8 UNITS SUBCUTANEOUSLY WITH BREAKFAST AND LUNCH, AND 16 UNITS WITH DINNER (Patient taking differently: Inject 8-16 Units into the skin See admin instructions. Takes 8 units  with breakfast and lunch, and 12 units with dinner)   meclizine (ANTIVERT) 25 MG tablet Take 25 mg by mouth 3 (three) times daily as needed for dizziness.   niacin (SLO-NIACIN) 500 MG tablet Take  500 mg by mouth at bedtime.   omeprazole (PRILOSEC) 40 MG capsule Take 40 mg by mouth daily.   ondansetron (ZOFRAN) 4 MG tablet Take 1 tablet (4 mg total) by mouth every 6 (six) hours as needed for nausea.   valsartan-hydrochlorothiazide (DIOVAN-HCT) 160-12.5 MG tablet Take 1 tablet by mouth in the morning.   No current facility-administered medications for this visit. (Other)   REVIEW OF SYSTEMS: ROS   Positive for: Gastrointestinal, Neurological, Endocrine, Eyes Negative for: Constitutional, Skin, Genitourinary, Musculoskeletal, HENT, Cardiovascular, Respiratory, Psychiatric,  Allergic/Imm, Heme/Lymph Last edited by Roselee Nova D, COT on 03/17/2021  7:50 AM.     ALLERGIES No Known Allergies  PAST MEDICAL HISTORY Past Medical History:  Diagnosis Date   Complication of anesthesia    Diabetes mellitus without complication ([place])    Diabetic retinopathy ([place])    GERD (gastroesophageal reflux disease)    History of kidney stones    Hypertension    Hypertensive retinopathy    Stroke [facility])    age 20- no residual   Past Surgical History:  Procedure Laterality Date   BREAST BIOPSY Left    CATARACT EXTRACTION Bilateral 2018   Dr. Tommy Rainwater   CHOLECYSTECTOMY N/A 07/01/2020   Procedure: LAPAROSCOPIC CHOLECYSTECTOMY;  Surgeon: Virl Cagey, MD;  Location: [facility];  Service: General;  Laterality: N/A;   EYE SURGERY     HAND SURGERY     IR RADIOLOGIST EVAL & MGMT  08/05/2020   NO PAST SURGERIES     PARS PLANA VITRECTOMY Right 02/17/2021   Procedure: PARS PLANA VITRECTOMY WITH 25 GAUGE;  Surgeon: Bernarda Caffey, MD;  Location: [facility];  Service: Ophthalmology;  Laterality: Right;   PHOTOCOAGULATION WITH LASER Right 02/17/2021   Procedure: PHOTOCOAGULATION WITH LASER;  Surgeon: Bernarda Caffey, MD;  Location: [facility];  Service: Ophthalmology;  Laterality: Right;   FAMILY HISTORY History reviewed. No pertinent family history.  SOCIAL HISTORY Social History   Tobacco Use   Smoking status: Former   Smokeless tobacco: Never   Tobacco comments:    Smoked in [employer]  Vaping Use   Vaping Use: Former  Substance Use Topics   Alcohol use: No   Drug use: No       OPHTHALMIC EXAM:  Base Eye Exam     Visual Acuity (Snellen - Linear)       Right Left   Dist [place] 20/60 20/50   Dist ph [place] 20/25 -2 20/30         Tonometry (Tonopen, 7:58 AM)       Right Left   Pressure 13 12         Pupils       Dark Light Shape React APD   Right 3 2.5 Round Sluggish None   Left 3 2 Round Brisk None         Visual Fields (Counting fingers)       Left Right     Full Full         Extraocular Movement       Right Left    Full, Ortho Full, Ortho         Neuro/Psych     Oriented x3: Yes   Mood/Affect: Normal         Dilation     Both eyes: 1.0% Mydriacyl, 2.5% Phenylephrine @ 7:58 AM           Slit Lamp and Fundus Exam     Slit Lamp Exam  Right Left   Lids/Lashes Dermatochalasis - upper lid, Meibomian gland dysfunction Dermatochalasis - upper lid, Meibomian gland dysfunction   Conjunctiva/Sclera sutures dissolving, Melanosis, Trace Injection nasal and temporal Pinguecula, Melanosis   Cornea Arcus, Well healed temporal cataract wounds, trace PEE Arcus, 1+ Punctate epithelial erosions, Well healed temporal cataract wounds, mild EBMD   Anterior Chamber deep, clear, narrow temporal angle, No cell or flare Deep and quiet   Iris Round and moderately dilated, focal iris atrophy at 1100  --  PI not open, No NVI Round and moderately dilated, small patent PI at 0200, mild bowing , No NVI   Lens PC IOL in good position with open PC PC IOL in good position with open PC   Anterior Vitreous post vitrectomy, VH cleared Vitreous syneresis         Fundus Exam       Right Left   Disc mild Pallor, Sharp rim Pink and Sharp, Compact   C/D Ratio 0.2 0.2   Macula Flat, Blunted foveal reflex, mild RPE mottling, focal drusen SN to fovea, no edema, scattered MA Flat, Blunted foveal reflex, scattered Microaneurysms   Vessels mild Vascular attenuation attenuated, mild tortuousity   Periphery Attached, scattered MA/DBH, 360 peripheral laser changes greatest temporally Attached, scattered IRH           IMAGING AND PROCEDURES  Imaging and Procedures for '@TODAY' @  OCT, Retina - OU - Both Eyes       Right Eye Quality was good. Central Foveal Thickness: 269. Progression has been stable. Findings include normal foveal contour, no IRF, no SRF (Trace cystic changes superior fovea and macula, interval improvement in vitreous opacities).    Left Eye Quality was good. Central Foveal Thickness: 255. Progression has been stable. Findings include no SRF, intraretinal hyper-reflective material, intraretinal fluid, abnormal foveal contour (cystic changes superior fovea -- slightly improved).   Notes *Images captured and stored on drive  Diagnosis / Impression:  OD: Trace cystic changes superior fovea and macula, interval improvement in vitreous opacities OS: persistent cystic changes superior fovea   Clinical management:  See below  Abbreviations: NFP - Normal foveal profile. CME - cystoid macular edema. PED - pigment epithelial detachment. IRF - intraretinal fluid. SRF - subretinal fluid. EZ - ellipsoid zone. ERM - epiretinal membrane. ORA - outer retinal atrophy. ORT - outer retinal tubulation. SRHM - subretinal hyper-reflective material            ASSESSMENT/PLAN:    ICD-10-CM   1. Vitreous hemorrhage of right eye (HCC)  H43.11     2. Moderate nonproliferative diabetic retinopathy of both eyes without macular edema associated with type 2 diabetes mellitus ([place])  F35.4562 OCT, Retina - OU - Both Eyes    3. Essential hypertension  I10     4. Hypertensive retinopathy of both eyes  H35.033     5. Pseudophakia of both eyes  Z96.1     6. Anatomical narrow angle  H40.039      Vitreous Hemorrhage OD - onset of floaters and vision loss beginning of Dec 2022 while putting up Christmas decorations - likely related to history of DM and HTN - pt also recalls multiple falls from vertigo around the same time of VH onset - s/p IVA OD #1 (12.08.22) - no significant improvement - pre op BCVA OD -- CF 1'  - now POW4 s/p PPV/EL 12.22.22 OD - intra-op -- visualized peripheral ischemia and scattered DBH; no RT/RD - doing great - BCVA 20/25 OD from  CF             - IOP 13 - cont  PF qdaily until Sunday, 01.22.23 and then stop - cont PSO ung qhs OD -- decrease to PRN only             - post op drop and positioning  instructions reviewed   - f/u 4 weeks, DFE, OCT  2. Moderate nonproliferative diabetic retinopathy w/o DME, OU  - exam with scattered MA OU -- OD w/ VH as above  - BCVA 20/30 OS  - OCT OS: Mild persistent cystic changes superior fovea  - discussed findings, prognosis  - recommend continued monitoring   3,4. Hypertensive retinopathy OU  - discussed importance of tight BP control  - monitor   5. Pseudophakia OU  - s/p CE/IOL OU  - beautiful surgeries by Dr. Talbert Forest, doing well  - monitor   6. Anatomical Narrow Angles OU  - s/p LPI OU by Dr. Talbert Forest  - OS patent LPI and open angle  - OD w/ closed PI  - saw Dr. Nancy Fetter on 09.21.20--no intervention recommended   Ophthalmic Meds Ordered this visit:  No orders of the defined types were placed in this encounter.    Return in about 4 weeks (around 04/14/2021) for f/u VH OD, POV, DFE, OCT.  There are no Patient Instructions on file for this visit.   Explained the diagnoses, plan, and follow up with the patient and they expressed understanding.  Patient expressed understanding of the importance of proper follow up care.   This document serves as a record of services personally performed by Gardiner Sleeper, MD, PhD. It was created on their behalf by San Jetty. Owens Shark, OA an ophthalmic technician. The creation of this record is the provider's dictation and/or activities during the visit.    Electronically signed by: San Jetty. Owens Shark, [place] 01.16.2023 8:55 AM   Gardiner Sleeper, M.D., Ph.D. Diseases & Surgery of the Retina and Vitreous [facility]  I have reviewed the above documentation for accuracy and completeness, and I agree with the above. Gardiner Sleeper, M.D., Ph.D. 03/18/21 8:57 AM   Abbreviations: M myopia (nearsighted); A astigmatism; H hyperopia (farsighted); P presbyopia; Mrx spectacle prescription;  CTL contact lenses; OD right eye; OS left eye; OU both eyes  XT exotropia; ET esotropia; PEK punctate epithelial  keratitis; PEE punctate epithelial erosions; DES dry eye syndrome; MGD meibomian gland dysfunction; ATs artificial tears; PFAT's preservative free artificial tears; [place] nuclear sclerotic cataract; PSC posterior subcapsular cataract; ERM epi-retinal membrane; PVD posterior vitreous detachment; RD retinal detachment; DM diabetes mellitus; DR diabetic retinopathy; NPDR non-proliferative diabetic retinopathy; PDR proliferative diabetic retinopathy; CSME clinically significant macular edema; DME diabetic macular edema; dbh dot blot hemorrhages; CWS cotton wool spot; POAG primary open angle glaucoma; C/D cup-to-disc ratio; HVF humphrey visual field; GVF goldmann visual field; OCT optical coherence tomography; IOP intraocular pressure; BRVO Branch retinal vein occlusion; CRVO central retinal vein occlusion; CRAO central retinal artery occlusion; BRAO branch retinal artery occlusion; RT retinal tear; SB scleral buckle; PPV pars plana vitrectomy; VH Vitreous hemorrhage; PRP panretinal laser photocoagulation; IVK intravitreal kenalog; VMT vitreomacular traction; MH Macular hole;  NVD neovascularization of the disc; NVE neovascularization elsewhere; AREDS age related eye disease study; ARMD age related macular degeneration; POAG primary open angle glaucoma; EBMD epithelial/anterior basement membrane dystrophy; ACIOL anterior chamber intraocular lens; IOL intraocular lens; PCIOL posterior chamber intraocular lens; Phaco/IOL phacoemulsification with intraocular lens placement; [place] photorefractive keratectomy;  LASIK laser assisted in situ keratomileusis; HTN hypertension; DM diabetes mellitus; COPD chronic obstructive pulmonary disease

## 2021-03-17 ENCOUNTER — Other Ambulatory Visit: Payer: Self-pay

## 2021-03-17 ENCOUNTER — Encounter (INDEPENDENT_AMBULATORY_CARE_PROVIDER_SITE_OTHER): Payer: Self-pay | Admitting: Ophthalmology

## 2021-03-17 ENCOUNTER — Ambulatory Visit (INDEPENDENT_AMBULATORY_CARE_PROVIDER_SITE_OTHER): Payer: Medicare HMO | Admitting: Ophthalmology

## 2021-03-17 DIAGNOSIS — H35033 Hypertensive retinopathy, bilateral: Secondary | ICD-10-CM

## 2021-03-17 DIAGNOSIS — I10 Essential (primary) hypertension: Secondary | ICD-10-CM

## 2021-03-17 DIAGNOSIS — H40039 Anatomical narrow angle, unspecified eye: Secondary | ICD-10-CM

## 2021-03-17 DIAGNOSIS — Z961 Presence of intraocular lens: Secondary | ICD-10-CM

## 2021-03-17 DIAGNOSIS — H4311 Vitreous hemorrhage, right eye: Secondary | ICD-10-CM

## 2021-03-17 DIAGNOSIS — E113393 Type 2 diabetes mellitus with moderate nonproliferative diabetic retinopathy without macular edema, bilateral: Secondary | ICD-10-CM

## 2021-03-18 ENCOUNTER — Encounter (INDEPENDENT_AMBULATORY_CARE_PROVIDER_SITE_OTHER): Payer: Self-pay | Admitting: Ophthalmology

## 2021-03-21 ENCOUNTER — Telehealth: Payer: Self-pay | Admitting: Endocrinology

## 2021-03-21 DIAGNOSIS — E1165 Type 2 diabetes mellitus with hyperglycemia: Secondary | ICD-10-CM

## 2021-03-21 DIAGNOSIS — Z794 Long term (current) use of insulin: Secondary | ICD-10-CM

## 2021-03-21 MED ORDER — ACCU-CHEK AVIVA PLUS VI STRP: ORAL_STRIP | 2 refills | Status: AC

## 2021-03-21 NOTE — Telephone Encounter (Signed)
Rx sent in to preferred pharmacy

## 2021-03-21 NOTE — Telephone Encounter (Signed)
Pt is calling in stating that she is needing Rx glucose blood (ACCU-CHEK AVIA PLUS) test strip   Pharm:  [facility] on [address] in [place], [place].

## 2021-03-24 ENCOUNTER — Encounter (INDEPENDENT_AMBULATORY_CARE_PROVIDER_SITE_OTHER): Payer: Medicare HMO | Admitting: Ophthalmology

## 2021-04-07 ENCOUNTER — Ambulatory Visit: Payer: Medicare HMO | Admitting: Endocrinology

## 2021-04-07 ENCOUNTER — Other Ambulatory Visit: Payer: Self-pay

## 2021-04-07 VITALS — BP 124/60 | HR 76 | Ht 66.0 in | Wt 187.6 lb

## 2021-04-07 DIAGNOSIS — Z794 Long term (current) use of insulin: Secondary | ICD-10-CM | POA: Diagnosis not present

## 2021-04-07 DIAGNOSIS — E1165 Type 2 diabetes mellitus with hyperglycemia: Secondary | ICD-10-CM | POA: Diagnosis not present

## 2021-04-07 DIAGNOSIS — I10 Essential (primary) hypertension: Secondary | ICD-10-CM

## 2021-04-07 DIAGNOSIS — E876 Hypokalemia: Secondary | ICD-10-CM | POA: Diagnosis not present

## 2021-04-07 LAB — BASIC METABOLIC PANEL
BUN: 10 mg/dL (ref 6–23)
CO2: 33 mEq/L — ABNORMAL HIGH (ref 19–32)
Calcium: 10.2 mg/dL (ref 8.4–10.5)
Chloride: 104 mEq/L (ref 96–112)
Creatinine, Ser: 0.74 mg/dL (ref 0.40–1.20)
GFR: 75.44 mL/min (ref >60.00)
Glucose, Bld: 121 mg/dL — ABNORMAL HIGH (ref 70–99)
Potassium: 4.3 mEq/L (ref 3.5–5.1)
Sodium: 143 mEq/L (ref 135–145)

## 2021-04-07 LAB — POCT GLYCOSYLATED HEMOGLOBIN (HGB A1C): Hemoglobin A1C: 6.7 % — AB (ref 4.0–5.6)

## 2021-04-07 NOTE — Progress Notes (Signed)
[facility] Note  04/14/2021    CHIEF COMPLAINT Patient presents for Retina Follow Up   HISTORY OF PRESENT ILLNESS: Sandra Washington is a 81 y.o. female who presents to the clinic today for:   HPI     Retina Follow Up   Patient presents with  Other.  In right eye.  This started 4 weeks ago.  I, the attending physician,  performed the HPI with the patient and updated documentation appropriately.        Comments   Patient here for 4 weeks retina follow up for VH OD. Patient states vision doing pretty good. One or 2 days has had black spots. Didn't last long like 2 days. OD had like a tear drop and bubble like temporally comes and goes. No eye pain.       Last edited by Bernarda Caffey, MD on 04/14/2021  3:32 PM.    Pt states states her BP was 122/60 at her last dr visit, she states her blood sugar stays in the 90-100 range  Referring physician: Lucianne Lei, MD [address] [place],  [place] [address]  HISTORICAL INFORMATION:   Selected notes from the MEDICAL RECORD NUMBER Referred by Dr. Madelin Headings for concern of diabetic retinopathy   CURRENT MEDICATIONS: Current Outpatient Medications (Ophthalmic Drugs)  Medication Sig   atropine 1 % ophthalmic solution Place 1 drop into the right eye in the morning and at bedtime.   bacitracin-polymyxin b (POLYSPORIN) ophthalmic ointment Place 1 application into the right eye at bedtime.   gatifloxacin (ZYMAXID) 0.5 % SOLN Place 1 drop into the right eye 4 (four) times daily. (Patient not taking: Reported on 04/07/2021)   prednisoLONE acetate (PRED FORTE) 1 % ophthalmic suspension Place 1 drop into the right eye 3 (three) times daily. (Patient not taking: Reported on 04/07/2021)   No current facility-administered medications for this visit. (Ophthalmic Drugs)   Current Outpatient Medications (Other)  Medication Sig   ACCU-CHEK SOFTCLIX LANCETS lancets Use bid   acetaminophen (TYLENOL) 500 MG tablet  Take 500-1,000 mg by mouth every 6 (six) hours as needed (for back aches).   amLODipine (NORVASC) 5 MG tablet Take 5 mg by mouth 2 (two) times daily.   atorvastatin (LIPITOR) 20 MG tablet Take 1 tablet (20 mg total) by mouth daily. (Patient taking differently: Take 20 mg by mouth at bedtime.)   Blood Glucose Monitoring Suppl (ACCU-CHEK GUIDE ME) w/Device KIT 1 each by Does not apply route 2 (two) times daily. Use accu chek guide me device to check blood sugar twice daily.DX:E11.65   Cholecalciferol (VITAMIN D3) 5000 UNITS CAPS Take 5,000 Units by mouth daily.   glucose blood (ACCU-CHEK AVIVA PLUS) test strip Use to check blood sugar 2 times daily   insulin NPH Human (NOVOLIN N RELION) 100 UNIT/ML injection 8 U in am and 12 U hs (Patient taking differently: Inject 8-10 Units into the skin See admin instructions. Takes 6 units in the morning, 8 units at lunch and 10 units at night)   insulin regular (NOVOLIN R RELION) 100 units/mL injection INJECT 8 UNITS SUBCUTANEOUSLY WITH BREAKFAST AND LUNCH, AND 16 UNITS WITH DINNER (Patient taking differently: Inject 8-16 Units into the skin See admin instructions. Takes 8 units  with breakfast and lunch, and 12 units with dinner)   meclizine (ANTIVERT) 25 MG tablet Take 25 mg by mouth 3 (three) times daily as needed for dizziness.   niacin (SLO-NIACIN) 500 MG tablet  Take 500 mg by mouth at bedtime.   omeprazole (PRILOSEC) 40 MG capsule Take 40 mg by mouth daily.   ondansetron (ZOFRAN) 4 MG tablet Take 1 tablet (4 mg total) by mouth every 6 (six) hours as needed for nausea.   valsartan-hydrochlorothiazide (DIOVAN-HCT) 160-12.5 MG tablet Take 1 tablet by mouth in the morning.   No current facility-administered medications for this visit. (Other)   REVIEW OF SYSTEMS: ROS   Positive for: Gastrointestinal, Neurological, Endocrine, Eyes Negative for: Constitutional, Skin, Genitourinary, Musculoskeletal, HENT, Cardiovascular, Respiratory, Psychiatric, Allergic/Imm,  Heme/Lymph Last edited by Theodore Demark, COA on 04/14/2021  7:53 AM.     ALLERGIES No Known Allergies  PAST MEDICAL HISTORY Past Medical History:  Diagnosis Date   Complication of anesthesia    Diabetes mellitus without complication ([place])    Diabetic retinopathy ([place])    GERD (gastroesophageal reflux disease)    History of kidney stones    Hypertension    Hypertensive retinopathy    Stroke [facility])    age 6- no residual   Past Surgical History:  Procedure Laterality Date   BREAST BIOPSY Left    CATARACT EXTRACTION Bilateral 2018   Dr. Tommy Rainwater   CHOLECYSTECTOMY N/A 07/01/2020   Procedure: LAPAROSCOPIC CHOLECYSTECTOMY;  Surgeon: Virl Cagey, MD;  Location: [facility];  Service: General;  Laterality: N/A;   EYE SURGERY     HAND SURGERY     IR RADIOLOGIST EVAL & MGMT  08/05/2020   NO PAST SURGERIES     PARS PLANA VITRECTOMY Right 02/17/2021   Procedure: PARS PLANA VITRECTOMY WITH 25 GAUGE;  Surgeon: Bernarda Caffey, MD;  Location: [place];  Service: Ophthalmology;  Laterality: Right;   PHOTOCOAGULATION WITH LASER Right 02/17/2021   Procedure: PHOTOCOAGULATION WITH LASER;  Surgeon: Bernarda Caffey, MD;  Location: [place];  Service: Ophthalmology;  Laterality: Right;   FAMILY HISTORY History reviewed. No pertinent family history.  SOCIAL HISTORY Social History   Tobacco Use   Smoking status: Former   Smokeless tobacco: Never   Tobacco comments:    Smoked in [employer]  Vaping Use   Vaping Use: Former  Substance Use Topics   Alcohol use: No   Drug use: No       OPHTHALMIC EXAM:  Base Eye Exam     Visual Acuity (Snellen - Linear)       Right Left   Dist cc 20/30 -1 20/20   Dist ph cc 20/25 -2     Correction: Glasses         Tonometry (Tonopen, 7:48 AM)       Right Left   Pressure 15 10         Pupils       Dark Light Shape React APD   Right 3 2.5 Round Sluggish None   Left 3 2 Round Brisk None         Visual Fields (Counting fingers)        Left Right    Full Full         Extraocular Movement       Right Left    Full, Ortho Full, Ortho         Neuro/Psych     Oriented x3: Yes   Mood/Affect: Normal         Dilation     Both eyes: 1.0% Mydriacyl, 2.5% Phenylephrine @ 7:48 AM           Slit Lamp and Fundus Exam  Slit Lamp Exam       Right Left   Lids/Lashes Dermatochalasis - upper lid, Meibomian gland dysfunction Dermatochalasis - upper lid, Meibomian gland dysfunction   Conjunctiva/Sclera Melanosis nasal and temporal Pinguecula, Melanosis   Cornea Arcus, Well healed temporal cataract wounds, trace PEE Arcus, trace Punctate epithelial erosions, Well healed temporal cataract wounds, mild EBMD   Anterior Chamber deep, clear, narrow temporal angle, No cell or flare Deep and quiet   Iris Round and moderately dilated, focal iris atrophy at 1100  --  PI not open, No NVI Round and moderately dilated, small patent PI at 0200, mild bowing , No NVI   Lens PC IOL in good position with open PC PC IOL in good position with open PC   Anterior Vitreous post vitrectomy, VH cleared Vitreous syneresis         Fundus Exam       Right Left   Disc mild Pallor, Sharp rim Pink and Sharp, Compact   C/D Ratio 0.2 0.2   Macula Flat, Blunted foveal reflex, mild RPE mottling, focal drusen SN to fovea, no edema, scattered MA and cystic changes Flat, Blunted foveal reflex, scattered Microaneurysms   Vessels mild attenuation, mild tortuosity, Copper wiring attenuated, mild tortuousity   Periphery Attached, scattered MA/DBH, 360 peripheral laser changes greatest temporally Attached, scattered IRH           Refraction     Wearing Rx       Sphere Cylinder Axis Add   Right -2.25 +2.25 180 +2.50   Left -1.00 +1.50 175 +2.50    Type: prog           IMAGING AND PROCEDURES  Imaging and Procedures for '@TODAY' @  OCT, Retina - OU - Both Eyes       Right Eye Quality was good. Central Foveal Thickness: 286.  Progression has worsened. Findings include no SRF, abnormal foveal contour, intraretinal fluid (Mild interval increase in IRF / cystic changes).   Left Eye Quality was good. Central Foveal Thickness: 248. Progression has been stable. Findings include no SRF, intraretinal hyper-reflective material, intraretinal fluid, abnormal foveal contour (cystic changes superior fovea -- slightly improved).   Notes *Images captured and stored on drive  Diagnosis / Impression:  OD: Mild interval increase in IRF / cystic changes OS: persistent cystic changes superior fovea  Clinical management:  See below  Abbreviations: NFP - Normal foveal profile. CME - cystoid macular edema. PED - pigment epithelial detachment. IRF - intraretinal fluid. SRF - subretinal fluid. EZ - ellipsoid zone. ERM - epiretinal membrane. ORA - outer retinal atrophy. ORT - outer retinal tubulation. SRHM - subretinal hyper-reflective material            ASSESSMENT/PLAN:    ICD-10-CM   1. Vitreous hemorrhage of right eye (HCC)  H43.11     2. Moderate nonproliferative diabetic retinopathy of both eyes without macular edema associated with type 2 diabetes mellitus ([place])  Z00.1749 OCT, Retina - OU - Both Eyes    3. Essential hypertension  I10     4. Hypertensive retinopathy of both eyes  H35.033     5. Pseudophakia of both eyes  Z96.1     6. Anatomical narrow angle  H40.039      Vitreous Hemorrhage OD - onset of floaters and vision loss beginning of Dec 2022 while putting up Christmas decorations - likely related to history of DM and HTN - pt also recalls multiple falls from vertigo around the same time of  VH onset - s/p IVA OD #1 (12.08.22) - no significant improvement - pre op BCVA OD -- CF 1'  - s/p PPV/EL 12.22.22 OD - intra-op -- visualized peripheral ischemia and scattered DBH; no RT/RD - doing great - BCVA 20/25 OD from CF             - IOP 15 - off all drops  - f/u 6 weeks, DFE, OCT  2. Moderate  nonproliferative diabetic retinopathy w/o DME, OU  - exam with scattered MA OU -- OD w/ VH as above  - BCVA 20/30 OS  - OCT shows early DME-- OD: Mild interval increase in IRF / cystic changes; OS: Mild persistent cystic changes superior fovea  - discussed findings, prognosis  - recommend continued monitoring   - f/u 6 wks -- DFE/OCT, possible tx  3,4. Hypertensive retinopathy OU  - discussed importance of tight BP control  - monitor   5. Pseudophakia OU  - s/p CE/IOL OU  - beautiful surgeries by Dr. Talbert Forest, doing well  - monitor   6. Anatomical Narrow Angles OU  - s/p LPI OU by Dr. Talbert Forest  - OS patent LPI and open angle  - OD w/ closed PI  - saw Dr. Nancy Fetter on 09.21.20--no intervention recommended   Ophthalmic Meds Ordered this visit:  No orders of the defined types were placed in this encounter.    Return in about 6 weeks (around 05/26/2021) for VH OD, DFE, OCT.  There are no Patient Instructions on file for this visit.   Explained the diagnoses, plan, and follow up with the patient and they expressed understanding.  Patient expressed understanding of the importance of proper follow up care.   This document serves as a record of services personally performed by Gardiner Sleeper, MD, PhD. It was created on their behalf by San Jetty. Owens Shark, OA an ophthalmic technician. The creation of this record is the provider's dictation and/or activities during the visit.    Electronically signed by: San Jetty. Owens Shark, [place] 02.09.2023 3:33 PM  Gardiner Sleeper, M.D., Ph.D. Diseases & Surgery of the Retina and Vitreous [facility]  I have reviewed the above documentation for accuracy and completeness, and I agree with the above. Gardiner Sleeper, M.D., Ph.D. 04/14/21 3:36 PM  Abbreviations: M myopia (nearsighted); A astigmatism; H hyperopia (farsighted); P presbyopia; Mrx spectacle prescription;  CTL contact lenses; OD right eye; OS left eye; OU both eyes  XT exotropia; ET  esotropia; PEK punctate epithelial keratitis; PEE punctate epithelial erosions; DES dry eye syndrome; MGD meibomian gland dysfunction; ATs artificial tears; PFAT's preservative free artificial tears; [place] nuclear sclerotic cataract; PSC posterior subcapsular cataract; ERM epi-retinal membrane; PVD posterior vitreous detachment; RD retinal detachment; DM diabetes mellitus; DR diabetic retinopathy; NPDR non-proliferative diabetic retinopathy; PDR proliferative diabetic retinopathy; CSME clinically significant macular edema; DME diabetic macular edema; dbh dot blot hemorrhages; CWS cotton wool spot; POAG primary open angle glaucoma; C/D cup-to-disc ratio; HVF humphrey visual field; GVF goldmann visual field; OCT optical coherence tomography; IOP intraocular pressure; BRVO Branch retinal vein occlusion; CRVO central retinal vein occlusion; CRAO central retinal artery occlusion; BRAO branch retinal artery occlusion; RT retinal tear; SB scleral buckle; PPV pars plana vitrectomy; VH Vitreous hemorrhage; PRP panretinal laser photocoagulation; IVK intravitreal kenalog; VMT vitreomacular traction; MH Macular hole;  NVD neovascularization of the disc; NVE neovascularization elsewhere; AREDS age related eye disease study; ARMD age related macular degeneration; POAG primary open angle glaucoma; EBMD epithelial/anterior basement membrane  dystrophy; ACIOL anterior chamber intraocular lens; IOL intraocular lens; PCIOL posterior chamber intraocular lens; Phaco/IOL phacoemulsification with intraocular lens placement; [place] photorefractive keratectomy; LASIK laser assisted in situ keratomileusis; HTN hypertension; DM diabetes mellitus; COPD chronic obstructive pulmonary disease

## 2021-04-07 NOTE — Patient Instructions (Signed)
Check blood sugars on waking up 3-4 days a week  Also check blood sugars about 2 hours after meals and do this after different meals by rotation  Recommended blood sugar levels on waking up are 90-130 and about 2 hours after meal is 130-160  Please bring your blood sugar monitor to each visit, thank you   

## 2021-04-07 NOTE — Progress Notes (Signed)
Please call to let patient know that the lab results are normal and no further action needed

## 2021-04-07 NOTE — Progress Notes (Signed)
Patient ID: Sandra Washington, female   DOB: 1940/03/08, 81 y.o.   MRN: [id number]   Reason for Appointment: follow-up   History of Present Illness   Diagnosis: Type 2 DIABETES MELITUS, date of diagnosis:   1995       She has been on insulin almost since her diagnosis and has had various regimens over the years More recently has been on basal bolus insulin with Lantus and Humalog with variable control, depending on her compliance with diet and exercise She was subsequently changed to NPH and regular insulin because of cost  Recent history:  Insulin regimen: Novolin N insulin 6 units a.m.-- 10 units at bedtime,  Novolin R 10-05-10 ac tid   Oral hypoglycemic drugs:none     Side effects from medications:  Diarrhea from Metformin  Her A1c is now 6.7 and relatively higher, previously 6.2   Current blood sugar patterns, management and problems: She was supposed to cut back her bedtime NPH to 8 units but is still taking 10 units  Again she is not quite clear what insulin doses she is taking  However her suppertime regular insulin was reduced by 4 units to avoid overnight hypoglycemia  She is using 2 types of glucose monitors and recently only has fasting readings on the active monitor  She has not done any exercises or gone to the gym because of cold weather  She thinks that most of the time she is watching her diet She is not complaining of any hypoglycemic symptoms now Currently not taking any non-insulin hypoglycemic drugs Weight is about 4 pounds higher than before   Monitors blood glucose: Once a day or less .    Glucometer:  Accu-Chek  Blood Glucose readings    PRE-MEAL Fasting Lunch Dinner Bedtime Overall  Glucose range: 74-150   120 74-150  Mean/median:     100   POST-MEAL PC Breakfast PC Lunch PC Dinner  Glucose range:   ?  Mean/median:      Prior  PRE-MEAL Fasting Lunch Dinner Bedtime Overall  Glucose range: 58-139    44-207  Mean/median: 93    97    POST-MEAL PC Breakfast PC Lunch PC Dinner  Glucose range:  160 207  Mean/median:              Meals: 3 meals per day.  breakfast is usually at 6-7 AM: Cereal or egg/toast.  Lunch  12:30pm             Dietician visit: Most recent: 03/2008           Wt Readings from Last 3 Encounters:  04/07/21 187 lb 9.6 oz (85.1 kg)  02/17/21 187 lb (84.8 kg)  01/05/21 183 lb (83 kg)    Lab Results  Component Value Date   HGBA1C 6.7 (A) 04/07/2021   HGBA1C 6.2 (A) 09/23/2020   HGBA1C 6.4 (A) 05/10/2020   Lab Results  Component Value Date   MICROALBUR <0.7 04/21/2019   [place] 75 09/23/2020   CREATININE 0.68 02/17/2021    OTHER problems discussed today: See review of systems   Office Visit on 04/07/2021  Component Date Value Ref Range Status   Hemoglobin A1C 04/07/2021 6.7 (A)  4.0 - 5.6 % Final    Allergies as of 04/07/2021   No Known Allergies      Medication List        Accurate as of April 07, 2021 11:37 AM. If you have any questions, ask your nurse  or doctor.          Accu-Chek Aviva Plus test strip Generic drug: glucose blood Use to check blood sugar 2 times daily   Accu-Chek Guide Me w/Device Kit 1 each by Does not apply route 2 (two) times daily. Use accu chek guide me device to check blood sugar twice daily.DX:E11.65   Accu-Chek Softclix Lancets lancets Use bid   acetaminophen 500 MG tablet Commonly known as: TYLENOL Take 500-1,000 mg by mouth every 6 (six) hours as needed (for back aches).   amLODipine 5 MG tablet Commonly known as: NORVASC Take 5 mg by mouth 2 (two) times daily.   atorvastatin 20 MG tablet Commonly known as: LIPITOR Take 1 tablet (20 mg total) by mouth daily. What changed: when to take this   atropine 1 % ophthalmic solution Place 1 drop into the right eye in the morning and at bedtime.   bacitracin-polymyxin b ophthalmic ointment Commonly known as: POLYSPORIN Place 1 application into the right eye at bedtime.    gatifloxacin 0.5 % Soln Commonly known as: ZYMAXID Place 1 drop into the right eye 4 (four) times daily.   insulin NPH Human 100 UNIT/ML injection Commonly known as: NovoLIN N ReliOn 8 U in am and 12 U hs What changed:  how much to take how to take this when to take this additional instructions   insulin regular 100 units/mL injection Commonly known as: NovoLIN R ReliOn INJECT 8 UNITS SUBCUTANEOUSLY WITH BREAKFAST AND LUNCH, AND 16 UNITS WITH DINNER What changed:  how much to take how to take this when to take this additional instructions   meclizine 25 MG tablet Commonly known as: ANTIVERT Take 25 mg by mouth 3 (three) times daily as needed for dizziness.   niacin 500 MG tablet Commonly known as: SLO-NIACIN Take 500 mg by mouth at bedtime.   omeprazole 40 MG capsule Commonly known as: PRILOSEC Take 40 mg by mouth daily.   ondansetron 4 MG tablet Commonly known as: ZOFRAN Take 1 tablet (4 mg total) by mouth every 6 (six) hours as needed for nausea.   prednisoLONE acetate 1 % ophthalmic suspension Commonly known as: PRED FORTE Place 1 drop into the right eye 3 (three) times daily.   valsartan-hydrochlorothiazide 160-12.5 MG tablet Commonly known as: DIOVAN-HCT Take 1 tablet by mouth in the morning.   Vitamin D3 125 MCG (5000 UT) Caps Take 5,000 Units by mouth daily.        Allergies: No Known Allergies  Past Medical History:  Diagnosis Date   Complication of anesthesia    Diabetes mellitus without complication (HCC)    Diabetic retinopathy ([place])    GERD (gastroesophageal reflux disease)    History of kidney stones    Hypertension    Hypertensive retinopathy    Stroke [facility])    age 9- no residual    Past Surgical History:  Procedure Laterality Date   BREAST BIOPSY Left    CATARACT EXTRACTION Bilateral 2018   Dr. Tommy Rainwater   CHOLECYSTECTOMY N/A 07/01/2020   Procedure: LAPAROSCOPIC CHOLECYSTECTOMY;  Surgeon: Virl Cagey, MD;  [facility];  Service: General;  Laterality: N/A;   EYE SURGERY     HAND SURGERY     IR RADIOLOGIST EVAL & MGMT  08/05/2020   NO PAST SURGERIES     PARS PLANA VITRECTOMY Right 02/17/2021   Procedure: PARS PLANA VITRECTOMY WITH 25 GAUGE;  Surgeon: Bernarda Caffey, MD;  Location: [place];  Service: Ophthalmology;  Laterality: Right;  PHOTOCOAGULATION WITH LASER Right 02/17/2021   Procedure: PHOTOCOAGULATION WITH LASER;  Surgeon: Bernarda Caffey, MD;  Location: [place];  Service: Ophthalmology;  Laterality: Right;    No family history on file.  Social History:  reports that she has quit smoking. She has never used smokeless tobacco. She reports that she does not drink alcohol and does not use drugs.  Review of Systems:   HYPERTENSION:  she is on valsartan HCTZ and amlodipine Followed by PCP regularly   BP Readings from Last 3 Encounters:  04/07/21 124/60  02/17/21 111/73  01/05/21 130/60    HYPERLIPIDEMIA:  Taking 20 mg atorvastatin and niacin 500 mg, Niacin has been prescribed by her PCP Last labs in April 2022 showed LDL 86  Risk factors: May have had a history of stroke  Lab Results  Component Value Date   CHOL 137 09/23/2020   CHOL 138 01/08/2020   CHOL 142 04/21/2019   Lab Results  Component Value Date   HDL 50.50 09/23/2020   HDL 56.50 01/08/2020   HDL 52.20 04/21/2019   Lab Results  Component Value Date   LDLCALC 75 09/23/2020   LDLCALC 72 01/08/2020   LDLCALC 78 04/21/2019   Lab Results  Component Value Date   TRIG 55.0 09/23/2020   TRIG 48.0 01/08/2020   TRIG 57.0 04/21/2019   Lab Results  Component Value Date   CHOLHDL 3 09/23/2020   CHOLHDL 2 01/08/2020   CHOLHDL 3 04/21/2019   No results found for: LDLDIRECT  Last diabetic foot exam in 7/22  No symptoms of numbness or tingling in the feet  She had diabetic eye exam in 6/22 with only a few microaneurysms present   Examination:   BP 124/60    Pulse 76    Ht '5\' 6"'  (1.676 m)    Wt 187 lb 9.6 oz (85.1  kg)    SpO2 99%    BMI 30.28 kg/m   Body mass index is 30.28 kg/m.   No pedal edema Feet are normal to inspection   ASSESSMENT/ PLAN:   Diabetes type 2, nonobese on insulin  See history of present illness for detailed discussion of  current management, blood sugar patterns and problems identified  She is on insulin using basal bolus generic NPH and regular insulins for cost reasons  A1c is 6.7 and about the same  Requiring relatively low doses of insulin except for 12 units of regular insulin at suppertime Recently no reported hypoglycemia but blood sugar monitoring is inadequate  Recommendations:  Recommend no change in insulin doses However if she starts getting low sugars in the morning she will reduce her bedtime NPH by 2 units She will check her blood sugars after meals more consistently either after lunch or dinner Again she will need to check on the status of her freestyle libre prescription, this apparently has been done through her PCP office Make sure she takes her regular insulin 30-minute before eating   HYPERTENSION: She is on amlodipine and valsartan HCT Blood pressure is controlled  Hypokalemia: She had a low potassium in December on her preop labs and will need to check this again This is likely to be from HCTZ   Patient Instructions  Check blood sugars on waking up 3-4 days a week  Also check blood sugars about 2 hours after meals and do this after different meals by rotation  Recommended blood sugar levels on waking up are 90-130 and about 2 hours after meal is 130-160  Please  bring your blood sugar monitor to each visit, thank you      Elayne Snare 04/07/2021, 11:37 AM   Note: This office note was prepared with Dragon voice recognition system technology. Any transcriptional errors that result from this process are unintentional.

## 2021-04-14 ENCOUNTER — Ambulatory Visit (INDEPENDENT_AMBULATORY_CARE_PROVIDER_SITE_OTHER): Payer: Medicare HMO | Admitting: Ophthalmology

## 2021-04-14 ENCOUNTER — Encounter (INDEPENDENT_AMBULATORY_CARE_PROVIDER_SITE_OTHER): Payer: Self-pay | Admitting: Ophthalmology

## 2021-04-14 ENCOUNTER — Other Ambulatory Visit: Payer: Self-pay

## 2021-04-14 DIAGNOSIS — I10 Essential (primary) hypertension: Secondary | ICD-10-CM | POA: Diagnosis not present

## 2021-04-14 DIAGNOSIS — H35033 Hypertensive retinopathy, bilateral: Secondary | ICD-10-CM

## 2021-04-14 DIAGNOSIS — E113393 Type 2 diabetes mellitus with moderate nonproliferative diabetic retinopathy without macular edema, bilateral: Secondary | ICD-10-CM

## 2021-04-14 DIAGNOSIS — H4311 Vitreous hemorrhage, right eye: Secondary | ICD-10-CM

## 2021-04-14 DIAGNOSIS — H40039 Anatomical narrow angle, unspecified eye: Secondary | ICD-10-CM

## 2021-04-14 DIAGNOSIS — Z961 Presence of intraocular lens: Secondary | ICD-10-CM

## 2021-04-29 DIAGNOSIS — E782 Mixed hyperlipidemia: Secondary | ICD-10-CM | POA: Diagnosis not present

## 2021-04-29 DIAGNOSIS — E1165 Type 2 diabetes mellitus with hyperglycemia: Secondary | ICD-10-CM | POA: Diagnosis not present

## 2021-04-29 DIAGNOSIS — Z6830 Body mass index (BMI) 30.0-30.9, adult: Secondary | ICD-10-CM | POA: Diagnosis not present

## 2021-04-29 DIAGNOSIS — Z794 Long term (current) use of insulin: Secondary | ICD-10-CM | POA: Diagnosis not present

## 2021-04-29 DIAGNOSIS — I129 Hypertensive chronic kidney disease with stage 1 through stage 4 chronic kidney disease, or unspecified chronic kidney disease: Secondary | ICD-10-CM | POA: Diagnosis not present

## 2021-04-29 DIAGNOSIS — E1169 Type 2 diabetes mellitus with other specified complication: Secondary | ICD-10-CM | POA: Diagnosis not present

## 2021-05-26 ENCOUNTER — Encounter (INDEPENDENT_AMBULATORY_CARE_PROVIDER_SITE_OTHER): Payer: Medicare HMO | Admitting: Ophthalmology

## 2021-05-27 DIAGNOSIS — E783 Hyperchylomicronemia: Secondary | ICD-10-CM | POA: Diagnosis not present

## 2021-05-27 DIAGNOSIS — I10 Essential (primary) hypertension: Secondary | ICD-10-CM | POA: Diagnosis not present

## 2021-05-27 DIAGNOSIS — E1100 Type 2 diabetes mellitus with hyperosmolarity without nonketotic hyperglycemic-hyperosmolar coma (NKHHC): Secondary | ICD-10-CM | POA: Diagnosis not present

## 2021-05-27 DIAGNOSIS — Z794 Long term (current) use of insulin: Secondary | ICD-10-CM | POA: Diagnosis not present

## 2021-05-30 NOTE — Progress Notes (Signed)
?[facility] Note ? ?05/31/2021 ?  ? ?CHIEF COMPLAINT ?Patient presents for Retina Follow Up ? ? ?HISTORY OF PRESENT ILLNESS: ?Sandra Washington is a 81 y.o. female who presents to the clinic today for:  ? ?HPI   ? ? Retina Follow Up   ?Patient presents with  Diabetic Retinopathy.  In right eye.  Severity is moderate.  Duration of 6.5 weeks.  Since onset it is gradually improving.  I, the attending physician,  performed the HPI with the patient and updated documentation appropriately. ? ?  ?  ? ? Comments   ?Pt here for 6.5wk ret f/u for VH OD, NPDR OU. Pt states shes seeing less floaters now than before.  ? ?  ?  ?Last edited by Bernarda Caffey, MD on 06/03/2021 12:24 AM.  ?  ? ?Pts last A1c was 6.7 on 02.09.23 ? ?Referring physician: ?Lucianne Lei, MD ?[place] ?[address] ?[place],  [place] [address] ? ?HISTORICAL INFORMATION:  ? ?Selected notes from the [facility] ?Referred by Dr. Madelin Headings for concern of diabetic retinopathy  ? ?CURRENT MEDICATIONS: ?Current Outpatient Medications (Ophthalmic Drugs)  ?Medication Sig  ? atropine 1 % ophthalmic solution Place 1 drop into the right eye in the morning and at bedtime.  ? bacitracin-polymyxin b (POLYSPORIN) ophthalmic ointment Place 1 application into the right eye at bedtime.  ? prednisoLONE acetate (PRED FORTE) 1 % ophthalmic suspension Place 1 drop into the right eye 3 (three) times daily.  ? gatifloxacin (ZYMAXID) 0.5 % SOLN Place 1 drop into the right eye 4 (four) times daily. (Patient not taking: Reported on 05/31/2021)  ? ?No current facility-administered medications for this visit. (Ophthalmic Drugs)  ? ?Current Outpatient Medications (Other)  ?Medication Sig  ? ACCU-CHEK SOFTCLIX LANCETS lancets Use bid  ? acetaminophen (TYLENOL) 500 MG tablet Take 500-1,000 mg by mouth every 6 (six) hours as needed (for back aches).  ? amLODipine (NORVASC) 5 MG tablet Take 5 mg by mouth 2 (two) times daily.  ? atorvastatin (LIPITOR) 20 MG  tablet Take 1 tablet (20 mg total) by mouth daily. (Patient taking differently: Take 20 mg by mouth at bedtime.)  ? Blood Glucose Monitoring Suppl (ACCU-CHEK GUIDE ME) w/Device KIT 1 each by Does not apply route 2 (two) times daily. Use accu chek guide me device to check blood sugar twice daily.DX:E11.65  ? Cholecalciferol (VITAMIN D3) 5000 UNITS CAPS Take 5,000 Units by mouth daily.  ? glucose blood (ACCU-CHEK AVIVA PLUS) test strip Use to check blood sugar 2 times daily  ? insulin NPH Human (NOVOLIN N RELION) 100 UNIT/ML injection 8 U in am and 12 U hs (Patient taking differently: Inject 8-10 Units into the skin See admin instructions. Takes 6 units in the morning, 8 units at lunch and 10 units at night)  ? insulin regular (NOVOLIN R RELION) 100 units/mL injection INJECT 8 UNITS SUBCUTANEOUSLY WITH BREAKFAST AND LUNCH, AND 16 UNITS WITH DINNER (Patient taking differently: Inject 8-16 Units into the skin See admin instructions. Takes 8 units  with breakfast and lunch, and 12 units with dinner)  ? meclizine (ANTIVERT) 25 MG tablet Take 25 mg by mouth 3 (three) times daily as needed for dizziness.  ? niacin (SLO-NIACIN) 500 MG tablet Take 500 mg by mouth at bedtime.  ? omeprazole (PRILOSEC) 40 MG capsule Take 40 mg by mouth daily.  ? ondansetron (ZOFRAN) 4 MG tablet Take 1 tablet (4 mg total) by mouth every 6 (  six) hours as needed for nausea.  ? valsartan-hydrochlorothiazide (DIOVAN-HCT) 160-12.5 MG tablet Take 1 tablet by mouth in the morning.  ? ?No current facility-administered medications for this visit. (Other)  ? ?REVIEW OF SYSTEMS: ?ROS   ?Positive for: Gastrointestinal, Neurological, Endocrine, Eyes ?Negative for: Constitutional, Skin, Genitourinary, Musculoskeletal, HENT, Cardiovascular, Respiratory, Psychiatric, Allergic/Imm, Heme/Lymph ?Last edited by Kingsley Spittle, COT on 05/31/2021  8:54 AM.  ?  ? ?ALLERGIES ?No Known Allergies ? ?PAST MEDICAL HISTORY ?Past Medical History:  ?Diagnosis Date  ?  Complication of anesthesia   ? Diabetes mellitus without complication ([place])   ? Diabetic retinopathy ([place])   ? GERD (gastroesophageal reflux disease)   ? History of kidney stones   ? Hypertension   ? Hypertensive retinopathy   ? Stroke [facility])   ? age 17- no residual  ? ?Past Surgical History:  ?Procedure Laterality Date  ? BREAST BIOPSY Left   ? CATARACT EXTRACTION Bilateral 2018  ? Dr. Tommy Rainwater  ? CHOLECYSTECTOMY N/A 07/01/2020  ? Procedure: LAPAROSCOPIC CHOLECYSTECTOMY;  Surgeon: Virl Cagey, MD;  Location: [facility];  Service: General;  Laterality: N/A;  ? EYE SURGERY    ? HAND SURGERY    ? IR RADIOLOGIST EVAL & MGMT  08/05/2020  ? NO PAST SURGERIES    ? PARS PLANA VITRECTOMY Right 02/17/2021  ? Procedure: PARS PLANA VITRECTOMY WITH 25 GAUGE;  Surgeon: Bernarda Caffey, MD;  Location: [facility];  Service: Ophthalmology;  Laterality: Right;  ? PHOTOCOAGULATION WITH LASER Right 02/17/2021  ? Procedure: PHOTOCOAGULATION WITH LASER;  Surgeon: Bernarda Caffey, MD;  Location: [facility];  Service: Ophthalmology;  Laterality: Right;  ? ?FAMILY HISTORY ?History reviewed. No pertinent family history. ? ?SOCIAL HISTORY ?Social History  ? ?Tobacco Use  ? Smoking status: Former  ? Smokeless tobacco: Never  ? Tobacco comments:  ?  Smoked in [employer]  ?Vaping Use  ? Vaping Use: Former  ?Substance Use Topics  ? Alcohol use: No  ? Drug use: No  ?  ? ?  ?OPHTHALMIC EXAM: ? ?Base Eye Exam   ? ? Visual Acuity (Snellen - Linear)   ? ?   Right Left  ? Dist cc 20/25 -2 20/25  ? Dist ph cc 20/25 NI  ? ? Correction: Glasses  ? ?  ?  ? ? Tonometry (Tonopen, 8:58 AM)   ? ?   Right Left  ? Pressure 12 14  ? ?  ?  ? ? Pupils   ? ?   Dark Light Shape React APD  ? Right 3 2 Round Brisk None  ? Left 3 2 Round Brisk None  ? ?  ?  ? ? Visual Fields (Counting fingers)   ? ?   Left Right  ?  Full Full  ? ?  ?  ? ? Extraocular Movement   ? ?   Right Left  ?  Full, Ortho Full, Ortho  ? ?  ?  ? ? Neuro/Psych   ? ? Oriented x3: Yes  ? Mood/Affect: Normal  ? ?   ?  ? ? Dilation   ? ? Both eyes: 1.0% Mydriacyl, 2.5% Phenylephrine @ 8:59 AM  ? ?  ?  ? ?  ? ?Slit Lamp and Fundus Exam   ? ? Slit Lamp Exam   ? ?   Right Left  ? Lids/Lashes Dermatochalasis - upper lid, Meibomian gland dysfunction Dermatochalasis - upper lid, Meibomian gland dysfunction  ? Conjunctiva/Sclera Melanosis nasal and temporal Pinguecula, Melanosis  ?  Cornea Arcus, Well healed temporal cataract wounds, trace PEE Arcus, trace Punctate epithelial erosions, Well healed temporal cataract wounds, mild EBMD  ? Anterior Chamber deep, clear, narrow temporal angle, No cell or flare Deep and quiet  ? Iris Round and moderately dilated, focal iris atrophy at 1100  --  PI not open, No NVI Round and moderately dilated, small patent PI at 0200, mild bowing, No NVI  ? Lens PC IOL in good position with open PC PC IOL in good position with open PC  ? Anterior Vitreous post vitrectomy, VH cleared Vitreous syneresis  ? ?  ?  ? ? Fundus Exam   ? ?   Right Left  ? Disc mild Pallor, Sharp rim, mild PPA/PPP Pink and Sharp, Compact, mild PPP  ? C/D Ratio 0.2 0.2  ? Macula Flat, Blunted foveal reflex, mild RPE mottling, focal drusen SN to fovea, no edema, scattered MA and cystic changes greatest temporal mac Flat, Blunted foveal reflex, scattered Microaneurysms  ? Vessels mild attenuation, mild tortuosity, Copper wiring attenuated, mild tortuousity, Copper wiring  ? Periphery Attached, scattered MA/DBH, 360 peripheral laser changes greatest temporally, room for fill in if needed Attached, scattered IRH  ? ?  ?  ? ?  ? ?IMAGING AND PROCEDURES  ?Imaging and Procedures for _0 @ ? ?OCT, Retina - OU - Both Eyes   ? ?   ?Right Eye ?Quality was good. Central Foveal Thickness: 287. Progression has improved. Findings include no SRF, intraretinal fluid, normal foveal contour (Mild interval improvement in IRF / cystic changes).  ? ?Left Eye ?Quality was good. Central Foveal Thickness: 248. Progression has improved. Findings include no  SRF, intraretinal hyper-reflective material, intraretinal fluid, abnormal foveal contour (cystic changes superior fovea -- slightly improved).  ? ?Notes ?*Images captured and stored on drive ? ?Diagnosis / Impre

## 2021-05-31 ENCOUNTER — Ambulatory Visit (INDEPENDENT_AMBULATORY_CARE_PROVIDER_SITE_OTHER): Payer: Medicare HMO | Admitting: Ophthalmology

## 2021-05-31 ENCOUNTER — Encounter (INDEPENDENT_AMBULATORY_CARE_PROVIDER_SITE_OTHER): Payer: Self-pay | Admitting: Ophthalmology

## 2021-05-31 DIAGNOSIS — Z961 Presence of intraocular lens: Secondary | ICD-10-CM | POA: Diagnosis not present

## 2021-05-31 DIAGNOSIS — H35033 Hypertensive retinopathy, bilateral: Secondary | ICD-10-CM

## 2021-05-31 DIAGNOSIS — H40039 Anatomical narrow angle, unspecified eye: Secondary | ICD-10-CM

## 2021-05-31 DIAGNOSIS — I10 Essential (primary) hypertension: Secondary | ICD-10-CM | POA: Diagnosis not present

## 2021-05-31 DIAGNOSIS — H4311 Vitreous hemorrhage, right eye: Secondary | ICD-10-CM

## 2021-05-31 DIAGNOSIS — E113313 Type 2 diabetes mellitus with moderate nonproliferative diabetic retinopathy with macular edema, bilateral: Secondary | ICD-10-CM

## 2021-05-31 DIAGNOSIS — E113393 Type 2 diabetes mellitus with moderate nonproliferative diabetic retinopathy without macular edema, bilateral: Secondary | ICD-10-CM

## 2021-06-03 ENCOUNTER — Encounter (INDEPENDENT_AMBULATORY_CARE_PROVIDER_SITE_OTHER): Payer: Self-pay | Admitting: Ophthalmology

## 2021-07-28 DIAGNOSIS — E1169 Type 2 diabetes mellitus with other specified complication: Secondary | ICD-10-CM | POA: Diagnosis not present

## 2021-08-04 ENCOUNTER — Ambulatory Visit: Payer: Medicare HMO | Admitting: Endocrinology

## 2021-08-04 DIAGNOSIS — E1169 Type 2 diabetes mellitus with other specified complication: Secondary | ICD-10-CM | POA: Diagnosis not present

## 2021-08-09 ENCOUNTER — Ambulatory Visit: Payer: Medicare HMO | Admitting: Endocrinology

## 2021-08-09 ENCOUNTER — Encounter: Payer: Self-pay | Admitting: Endocrinology

## 2021-08-09 VITALS — BP 140/72 | HR 75 | Ht 66.0 in | Wt 186.8 lb

## 2021-08-09 DIAGNOSIS — E7800 Pure hypercholesterolemia, unspecified: Secondary | ICD-10-CM

## 2021-08-09 DIAGNOSIS — Z794 Long term (current) use of insulin: Secondary | ICD-10-CM

## 2021-08-09 DIAGNOSIS — I10 Essential (primary) hypertension: Secondary | ICD-10-CM | POA: Diagnosis not present

## 2021-08-09 DIAGNOSIS — E1165 Type 2 diabetes mellitus with hyperglycemia: Secondary | ICD-10-CM

## 2021-08-09 LAB — POCT GLYCOSYLATED HEMOGLOBIN (HGB A1C): Hemoglobin A1C: 6.4 % — AB (ref 4.0–5.6)

## 2021-08-09 NOTE — Patient Instructions (Addendum)
Novolin N insulin 6 units a.m.-- 8 units at bedtime,  Novolin R 8 am-8 lunch-14 at dinner    Check blood sugars on waking up  Also check blood sugars about 2 hours after meals and do this after different meals by rotation  Recommended blood sugar levels on waking up are 80-120 and about 2 hours after meal is 130-180  Please bring  monitor  to each visit, thank you

## 2021-08-09 NOTE — Progress Notes (Unsigned)
Patient ID: Sandra Washington, female   DOB: 21-May-1940, 81 y.o.   MRN: [id number]   Reason for Appointment: follow-up   History of Present Illness   Diagnosis: Type 2 DIABETES MELITUS, date of diagnosis:   1995       She has been on insulin almost since her diagnosis and has had various regimens over the years More recently has been on basal bolus insulin with Lantus and Humalog with variable control, depending on her compliance with diet and exercise She was subsequently changed to NPH and regular insulin because of cost  Recent history:  Insulin regimen: Novolin N insulin 6 units a.m.-- 12 units at bedtime,  Novolin R 10-05-14 ac tid   Oral hypoglycemic drugs:none     Side effects from medications:  Diarrhea from Metformin  Her A1c is now 6.7 and relatively higher, previously 6.2   Current blood sugar patterns, management and problems: She was supposed to cut back her bedtime NPH to 8 units but is still taking 10 units  Again she is not quite clear what insulin doses she is taking  However her suppertime regular insulin was reduced by 4 units to avoid overnight hypoglycemia  She is using 2 types of glucose monitors and recently only has fasting readings on the active monitor  She has not done any exercises or gone to the gym because of cold weather  She thinks that most of the time she is watching her diet She is not complaining of any hypoglycemic symptoms now Currently not taking any non-insulin hypoglycemic drugs Weight is about 4 pounds higher than before   Monitors blood glucose: Once a day or less .    Glucometer:  Accu-Chek  Blood Glucose readings    PRE-MEAL Fasting Lunch Dinner Bedtime Overall  Glucose range:       Mean/median:     120   POST-MEAL PC Breakfast PC Lunch PC Dinner  Glucose range:     Mean/median:        PRE-MEAL Fasting Lunch Dinner Bedtime Overall  Glucose range: 74-150   120 74-150  Mean/median:     100   POST-MEAL PC  Breakfast PC Lunch PC Dinner  Glucose range:   ?  Mean/median:            Meals: 3 meals per day.  breakfast is usually at 6-7 AM: Cereal or egg/toast.  Lunch  12:30pm             Dietician visit: Most recent: 03/2008           Wt Readings from Last 3 Encounters:  08/09/21 186 lb 12.8 oz (84.7 kg)  04/07/21 187 lb 9.6 oz (85.1 kg)  02/17/21 187 lb (84.8 kg)    Lab Results  Component Value Date   HGBA1C 6.7 (A) 04/07/2021   HGBA1C 6.2 (A) 09/23/2020   HGBA1C 6.4 (A) 05/10/2020   Lab Results  Component Value Date   MICROALBUR <0.7 04/21/2019   [place] 75 09/23/2020   CREATININE 0.74 04/07/2021    OTHER problems discussed today: See review of systems   No visits with results within 1 Week(s) from this visit.  Latest known visit with results is:  Office Visit on 04/07/2021  Component Date Value Ref Range Status   Hemoglobin A1C 04/07/2021 6.7 (A)  4.0 - 5.6 % Final   Sodium 04/07/2021 143  135 - 145 mEq/L Final   Potassium 04/07/2021 4.3  3.5 - 5.1 mEq/L Final  Chloride 04/07/2021 104  96 - 112 mEq/L Final   CO2 04/07/2021 33 (H)  19 - 32 mEq/L Final   Glucose, Bld 04/07/2021 121 (H)  70 - 99 mg/dL Final   BUN 04/07/2021 10  6 - 23 mg/dL Final   Creatinine, Ser 04/07/2021 0.74  0.40 - 1.20 mg/dL Final   GFR 04/07/2021 75.44  >60.00 mL/min Final   Calculated using the CKD-EPI Creatinine Equation (2021)   Calcium 04/07/2021 10.2  8.4 - 10.5 mg/dL Final    Allergies as of 08/09/2021   No Known Allergies      Medication List        Accurate as of August 09, 2021  1:11 PM. If you have any questions, ask your nurse or doctor.          STOP taking these medications    atropine 1 % ophthalmic solution Stopped by: Elayne Snare, MD   bacitracin-polymyxin b ophthalmic ointment Commonly known as: POLYSPORIN Stopped by: Elayne Snare, MD   gatifloxacin 0.5 % Soln Commonly known as: ZYMAXID Stopped by: Elayne Snare, MD   meclizine 25 MG tablet Commonly known as:  ANTIVERT Stopped by: Elayne Snare, MD   prednisoLONE acetate 1 % ophthalmic suspension Commonly known as: PRED FORTE Stopped by: Elayne Snare, MD       TAKE these medications    Accu-Chek Aviva Plus test strip Generic drug: glucose blood Use to check blood sugar 2 times daily   Accu-Chek Guide Me w/Device Kit 1 each by Does not apply route 2 (two) times daily. Use accu chek guide me device to check blood sugar twice daily.DX:E11.65   Accu-Chek Softclix Lancets lancets Use bid   acetaminophen 500 MG tablet Commonly known as: TYLENOL Take 500-1,000 mg by mouth every 6 (six) hours as needed (for back aches).   amLODipine 5 MG tablet Commonly known as: NORVASC Take 5 mg by mouth 2 (two) times daily.   atorvastatin 20 MG tablet Commonly known as: LIPITOR Take 1 tablet (20 mg total) by mouth daily. What changed: when to take this   insulin NPH Human 100 UNIT/ML injection Commonly known as: NovoLIN N ReliOn 8 U in am and 12 U hs   insulin regular 100 units/mL injection Commonly known as: NovoLIN R ReliOn INJECT 8 UNITS SUBCUTANEOUSLY WITH BREAKFAST AND LUNCH, AND 16 UNITS WITH DINNER   niacin 500 MG tablet Commonly known as: SLO-NIACIN Take 500 mg by mouth at bedtime.   omeprazole 40 MG capsule Commonly known as: PRILOSEC Take 40 mg by mouth daily.   ondansetron 4 MG tablet Commonly known as: ZOFRAN Take 1 tablet (4 mg total) by mouth every 6 (six) hours as needed for nausea.   valsartan-hydrochlorothiazide 160-12.5 MG tablet Commonly known as: DIOVAN-HCT Take 1 tablet by mouth in the morning.   Vitamin D3 125 MCG (5000 UT) Caps Take 5,000 Units by mouth daily.        Allergies: No Known Allergies  Past Medical History:  Diagnosis Date   Complication of anesthesia    Diabetes mellitus without complication ([facility])    Diabetic retinopathy ([place])    GERD (gastroesophageal reflux disease)    History of kidney stones    Hypertension    Hypertensive retinopathy     Stroke [facility])    age 60- no residual    Past Surgical History:  Procedure Laterality Date   BREAST BIOPSY Left    CATARACT EXTRACTION Bilateral 2018   Dr. Tommy Rainwater   CHOLECYSTECTOMY N/A 07/01/2020  Procedure: LAPAROSCOPIC CHOLECYSTECTOMY;  Surgeon: Virl Cagey, MD;  Location: [facility];  Service: General;  Laterality: N/A;   EYE SURGERY     HAND SURGERY     IR RADIOLOGIST EVAL & MGMT  08/05/2020   NO PAST SURGERIES     PARS PLANA VITRECTOMY Right 02/17/2021   Procedure: PARS PLANA VITRECTOMY WITH 25 GAUGE;  Surgeon: Bernarda Caffey, MD;  Location: [facility];  Service: Ophthalmology;  Laterality: Right;   PHOTOCOAGULATION WITH LASER Right 02/17/2021   Procedure: PHOTOCOAGULATION WITH LASER;  Surgeon: Bernarda Caffey, MD;  Location: [facility];  Service: Ophthalmology;  Laterality: Right;    No family history on file.  Social History:  reports that she has quit smoking. She has never used smokeless tobacco. She reports that she does not drink alcohol and does not use drugs.  Review of Systems:   HYPERTENSION:  she is on valsartan HCTZ and amlodipine Followed by PCP regularly   BP Readings from Last 3 Encounters:  08/09/21 140/72  04/07/21 124/60  02/17/21 111/73    HYPERLIPIDEMIA:  Taking 20 mg atorvastatin and niacin 500 mg, Niacin has been prescribed by her PCP Last labs in April 2022 showed LDL 86  Risk factors: May have had a history of stroke  Lab Results  Component Value Date   CHOL 137 09/23/2020   CHOL 138 01/08/2020   CHOL 142 04/21/2019   Lab Results  Component Value Date   HDL 50.50 09/23/2020   HDL 56.50 01/08/2020   HDL 52.20 04/21/2019   Lab Results  Component Value Date   LDLCALC 75 09/23/2020   LDLCALC 72 01/08/2020   LDLCALC 78 04/21/2019   Lab Results  Component Value Date   TRIG 55.0 09/23/2020   TRIG 48.0 01/08/2020   TRIG 57.0 04/21/2019   Lab Results  Component Value Date   CHOLHDL 3 09/23/2020   CHOLHDL 2 01/08/2020   CHOLHDL 3  04/21/2019   No results found for: "LDLDIRECT"  Last diabetic foot exam in 7/22  No symptoms of numbness or tingling in the feet  She had diabetic eye exam in 6/22 with only a few microaneurysms present   Examination:   BP 140/72 (BP Location: Left Arm, Patient Position: Sitting, Cuff Size: Normal)   Pulse 75   Ht '5\' 6"'  (1.676 m)   Wt 186 lb 12.8 oz (84.7 kg)   SpO2 99%   BMI 30.15 kg/m   Body mass index is 30.15 kg/m.   No pedal edema Feet are normal to inspection   ASSESSMENT/ PLAN:   Diabetes type 2, nonobese on insulin  See history of present illness for detailed discussion of  current management, blood sugar patterns and problems identified  She is on insulin using basal bolus generic NPH and regular insulins for cost reasons  A1c is 6.4  Requiring relatively low doses of insulin except for 12 units of regular insulin at suppertime Recently no reported hypoglycemia but blood sugar monitoring is inadequate  Recommendations:  Recommend no change in insulin doses However if she starts getting low sugars in the morning she will reduce her bedtime NPH by 2 units She will check her blood sugars after meals more consistently either after lunch or dinner Again she will need to check on the status of her freestyle libre prescription, this apparently has been done through her PCP office Make sure she takes her regular insulin 30-minute before eating   HYPERTENSION: She is on amlodipine and valsartan HCT Blood pressure is  controlled  Hypokalemia: She had a low potassium in December on her preop labs and will need to check this again This is likely to be from HCTZ   There are no Patient Instructions on file for this visit.     Elayne Snare 08/09/2021, 1:11 PM   Note: This office note was prepared with Dragon voice recognition system technology. Any transcriptional errors that result from this process are unintentional.

## 2021-08-26 DIAGNOSIS — I10 Essential (primary) hypertension: Secondary | ICD-10-CM | POA: Diagnosis not present

## 2021-08-26 DIAGNOSIS — E783 Hyperchylomicronemia: Secondary | ICD-10-CM | POA: Diagnosis not present

## 2021-08-26 NOTE — Progress Notes (Signed)
[facility] Note  08/29/2021    CHIEF COMPLAINT Patient presents for Retina Follow Up   HISTORY OF PRESENT ILLNESS: Sandra Washington is a 81 y.o. female who presents to the clinic today for:   HPI     Retina Follow Up   Patient presents with  Other.  In right eye.  Severity is moderate.  Duration of 3 months.  Since onset it is stable.  I, the attending physician,  performed the HPI with the patient and updated documentation appropriately.        Comments   Pt here for 3 mo ret f/u VH OD. Pt states VA the same, still has black floater in OD that comes and goes. Pt reports most recent A1C 6.4.       Last edited by Bernarda Caffey, MD on 08/31/2021 12:59 PM.    Pt states once in a while she will see a floater, doesn't bother her.   Referring physician: Lucianne Lei, MD [address] [place],  [place] [address]  HISTORICAL INFORMATION:   Selected notes from the MEDICAL RECORD NUMBER Referred by Dr. Madelin Headings for concern of diabetic retinopathy   CURRENT MEDICATIONS: No current outpatient medications on file. (Ophthalmic Drugs)   No current facility-administered medications for this visit. (Ophthalmic Drugs)   Current Outpatient Medications (Other)  Medication Sig   ACCU-CHEK SOFTCLIX LANCETS lancets Use bid   acetaminophen (TYLENOL) 500 MG tablet Take 500-1,000 mg by mouth every 6 (six) hours as needed (for back aches).   amLODipine (NORVASC) 5 MG tablet Take 5 mg by mouth 2 (two) times daily.   atorvastatin (LIPITOR) 20 MG tablet Take 1 tablet (20 mg total) by mouth daily. (Patient taking differently: Take 20 mg by mouth at bedtime.)   Blood Glucose Monitoring Suppl (ACCU-CHEK GUIDE ME) w/Device KIT 1 each by Does not apply route 2 (two) times daily. Use accu chek guide me device to check blood sugar twice daily.DX:E11.65   Cholecalciferol (VITAMIN D3) 5000 UNITS CAPS Take 5,000 Units by mouth daily.   glucose blood (ACCU-CHEK AVIVA  PLUS) test strip Use to check blood sugar 2 times daily   insulin NPH Human (NOVOLIN N RELION) 100 UNIT/ML injection 8 U in am and 12 U hs   insulin regular (NOVOLIN R RELION) 100 units/mL injection INJECT 8 UNITS SUBCUTANEOUSLY WITH BREAKFAST AND LUNCH, AND 16 UNITS WITH DINNER   niacin (SLO-NIACIN) 500 MG tablet Take 500 mg by mouth at bedtime.   omeprazole (PRILOSEC) 40 MG capsule Take 40 mg by mouth daily.   ondansetron (ZOFRAN) 4 MG tablet Take 1 tablet (4 mg total) by mouth every 6 (six) hours as needed for nausea.   valsartan-hydrochlorothiazide (DIOVAN-HCT) 160-12.5 MG tablet Take 1 tablet by mouth in the morning.   No current facility-administered medications for this visit. (Other)   REVIEW OF SYSTEMS: ROS   Positive for: Gastrointestinal, Neurological, Endocrine, Eyes Negative for: Constitutional, Skin, Genitourinary, Musculoskeletal, HENT, Cardiovascular, Respiratory, Psychiatric, Allergic/Imm, Heme/Lymph Last edited by Kingsley Spittle, COT on 08/29/2021  9:05 AM.     ALLERGIES No Known Allergies  PAST MEDICAL HISTORY Past Medical History:  Diagnosis Date   Complication of anesthesia    Diabetes mellitus without complication ([place])    Diabetic retinopathy ([place])    GERD (gastroesophageal reflux disease)    History of kidney stones    Hypertension    Hypertensive retinopathy    Stroke [facility])    age  23- no residual   Past Surgical History:  Procedure Laterality Date   BREAST BIOPSY Left    CATARACT EXTRACTION Bilateral 2018   Dr. Tommy Rainwater   CHOLECYSTECTOMY N/A 07/01/2020   Procedure: LAPAROSCOPIC CHOLECYSTECTOMY;  Surgeon: Virl Cagey, MD;  Location: [facility];  Service: General;  Laterality: N/A;   EYE SURGERY     HAND SURGERY     IR RADIOLOGIST EVAL & MGMT  08/05/2020   NO PAST SURGERIES     PARS PLANA VITRECTOMY Right 02/17/2021   Procedure: PARS PLANA VITRECTOMY WITH 25 GAUGE;  Surgeon: Bernarda Caffey, MD;  Location: [place];  Service: Ophthalmology;   Laterality: Right;   PHOTOCOAGULATION WITH LASER Right 02/17/2021   Procedure: PHOTOCOAGULATION WITH LASER;  Surgeon: Bernarda Caffey, MD;  Location: [place];  Service: Ophthalmology;  Laterality: Right;   FAMILY HISTORY History reviewed. No pertinent family history.  SOCIAL HISTORY Social History   Tobacco Use   Smoking status: Former   Smokeless tobacco: Never   Tobacco comments:    Smoked in [employer]  Vaping Use   Vaping Use: Former  Substance Use Topics   Alcohol use: No   Drug use: No       OPHTHALMIC EXAM:  Base Eye Exam     Visual Acuity (Snellen - Linear)       Right Left   Dist cc 20/25 +2 20/25   Dist ph cc NI 20/25 +2    Correction: Glasses         Tonometry (Tonopen, 9:10 AM)       Right Left   Pressure 15 12         Pupils       Dark Light Shape React APD   Right 3 2 Round Brisk None   Left 3 2 Round Brisk None         Visual Fields (Counting fingers)       Left Right    Full Full         Extraocular Movement       Right Left    Full, Ortho Full, Ortho         Neuro/Psych     Oriented x3: Yes   Mood/Affect: Normal         Dilation     Both eyes: 1.0% Mydriacyl, 2.5% Phenylephrine @ 9:11 AM           Slit Lamp and Fundus Exam     Slit Lamp Exam       Right Left   Lids/Lashes Dermatochalasis - upper lid, Meibomian gland dysfunction Dermatochalasis - upper lid, Meibomian gland dysfunction   Conjunctiva/Sclera Melanosis nasal and temporal Pinguecula, Melanosis   Cornea Arcus, Well healed temporal cataract wounds, trace PEE Arcus, trace Punctate epithelial erosions, Well healed temporal cataract wounds, mild EBMD   Anterior Chamber deep, clear, narrow temporal angle, No cell or flare Deep and quiet   Iris Round and moderately dilated, focal iris atrophy at 1100  --  PI not open, No NVI Round and moderately dilated, small patent PI at 0200, mild bowing, No NVI   Lens PC IOL in good position with open PC PC IOL in  good position with open PC   Anterior Vitreous post vitrectomy, VH cleared Vitreous syneresis         Fundus Exam       Right Left   Disc mild Pallor, Sharp rim, mild PPA/PPP Pink and Sharp, Compact, mild PPP   C/D  Ratio 0.2 0.2   Macula Flat, Blunted foveal reflex, mild RPE mottling, focal drusen SN to fovea, no edema, scattered MA and cystic changes greatest temporal mac, good focal laser targets ST mac Flat, Blunted foveal reflex, scattered Microaneurysms   Vessels mild attenuation, mild tortuosity, Copper wiring attenuated, mild tortuousity, Copper wiring   Periphery Attached, scattered MA/DBH, 360 peripheral laser changes greatest temporally, room for fill in if needed Attached, scattered IRH           Refraction     Wearing Rx       Sphere Cylinder Axis Add   Right -2.25 +2.25 180 +2.50   Left -1.00 +1.50 175 +2.50    Type: prog           IMAGING AND PROCEDURES  Imaging and Procedures for _0 @  OCT, Retina - OU - Both Eyes       Right Eye Quality was good. Central Foveal Thickness: 289. Progression has been stable. Findings include normal foveal contour, no SRF, intraretinal fluid (Mild persistent IRF / cystic changes temporal mac and fovea).   Left Eye Quality was good. Central Foveal Thickness: 241. Progression has improved. Findings include no SRF, abnormal foveal contour, intraretinal hyper-reflective material, intraretinal fluid (cystic changes superior fovea -- slightly improved).   Notes *Images captured and stored on drive  Diagnosis / Impression:  OD: Mild persistent IRF / cystic changes temporal mac and fovea OS: Cystic changes superior fovea -- slightly improved  Clinical management:  See below  Abbreviations: NFP - Normal foveal profile. CME - cystoid macular edema. PED - pigment epithelial detachment. IRF - intraretinal fluid. SRF - subretinal fluid. EZ - ellipsoid zone. ERM - epiretinal membrane. ORA - outer retinal atrophy. ORT - outer  retinal tubulation. SRHM - subretinal hyper-reflective material      Fluorescein Angiography Optos (Transit OD)       Right Eye Progression has been stable. Early phase findings include microaneurysm, vascular perfusion defect. Mid/Late phase findings include leakage, staining, microaneurysm, vascular perfusion defect (Focal leakage temporal macula, scattered leaking MA, staining of peripheral laser).   Left Eye Progression has been stable. Early phase findings include microaneurysm, vascular perfusion defect. Mid/Late phase findings include leakage, microaneurysm, vascular perfusion defect (Mild peripheral nonperfusion nasal periphery, scattered MA with late leakage).   Notes **Images stored on drive**  Impression: Moderate NPDR OU OD: Focal leakage temporal macula, scattered leaking MA, staining of peripheral laser OS: Mild peripheral nonperfusion nasal periphery, scattered MA with late leakage - no NV OU - patches of peripheral nonperfusion OU           ASSESSMENT/PLAN:    ICD-10-CM   1. Vitreous hemorrhage of right eye (HCC)  H43.11 Fluorescein Angiography Optos (Transit OD)    2. Moderate nonproliferative diabetic retinopathy of both eyes with macular edema associated with type 2 diabetes mellitus (HCC)  P71.0626 OCT, Retina - OU - Both Eyes    Fluorescein Angiography Optos (Transit OD)    3. Essential hypertension  I10     4. Hypertensive retinopathy of both eyes  H35.033 Fluorescein Angiography Optos (Transit OD)    5. Pseudophakia of both eyes  Z96.1     6. Anatomical narrow angle  H40.039      Vitreous Hemorrhage OD - onset of floaters and vision loss beginning of Dec 2022 while putting up Christmas decorations - likely related to history of DM and HTN - pt also recalls multiple falls from vertigo around the same time of VH onset -  s/p IVA OD #1 (12.08.22) - no significant improvement - pre op BCVA OD -- CF 1'  - s/p PPV/EL 12.22.22 OD - intra-op --  visualized peripheral ischemia and scattered DBH; no RT/RD - doing great - BCVA 20/25 OD from CF             - IOP 15 - off all drops  - monitor   2. Moderate nonproliferative diabetic retinopathy w/ DME, OU  - FA 07.03.23 shows: OD Focal leakage temporal macula, scattered leaking MA, staining of peripheral laser, OS Mild peripheral nonperfusion nasal periphery, scattered MA with late leakage, no NV OU - exam with scattered MA OU -- OD w/ h/o VH as above  - BCVA 20/25 OS - stable  - OCT shows OD: Mild persistent IRF / cystic changes temporal mac and fovea; OS: Cystic changes superior fovea -- slightly improved  - discussed tight BP control and BS control   - discussed findings, prognosis  - recommend continued monitoring   - f/u 3 months -- DFE/OCT, possible tx  3,4. Hypertensive retinopathy OU  - discussed importance of tight BP control  - monitor   5. Pseudophakia OU  - s/p CE/IOL OU  - beautiful surgeries by Dr. Talbert Forest, doing well  - monitor   6. Anatomical Narrow Angles OU  - s/p LPI OU by Dr. Talbert Forest  - OS patent LPI and open angle  - OD w/ closed PI  - saw Dr. Nancy Fetter on 09.21.20--no intervention recommended   Ophthalmic Meds Ordered this visit:  No orders of the defined types were placed in this encounter.    Return in about 3 months (around 11/29/2021) for DFE, OCT.  There are no Patient Instructions on file for this visit.   Explained the diagnoses, plan, and follow up with the patient and they expressed understanding.  Patient expressed understanding of the importance of proper follow up care.   This document serves as a record of services personally performed by Gardiner Sleeper, MD, PhD. It was created on their behalf by San Jetty. Owens Shark, OA an ophthalmic technician. The creation of this record is the provider's dictation and/or activities during the visit.    Electronically signed by: San Jetty. Owens Shark, [place] 06.30.2023 1:01 PM  This document serves as a record of  services personally performed by Gardiner Sleeper, MD, PhD. It was created on their behalf by Leonie Douglas, an ophthalmic technician. The creation of this record is the provider's dictation and/or activities during the visit.    Electronically signed by: Leonie Douglas COA, 08/31/21  1:01 PM   Gardiner Sleeper, M.D., Ph.D. Diseases & Surgery of the Retina and Vitreous [facility]  I have reviewed the above documentation for accuracy and completeness, and I agree with the above. Gardiner Sleeper, M.D., Ph.D. 08/31/21 1:04 PM    Abbreviations: M myopia (nearsighted); A astigmatism; H hyperopia (farsighted); P presbyopia; Mrx spectacle prescription;  CTL contact lenses; OD right eye; OS left eye; OU both eyes  XT exotropia; ET esotropia; PEK punctate epithelial keratitis; PEE punctate epithelial erosions; DES dry eye syndrome; MGD meibomian gland dysfunction; ATs artificial tears; PFAT's preservative free artificial tears; [place] nuclear sclerotic cataract; PSC posterior subcapsular cataract; ERM epi-retinal membrane; PVD posterior vitreous detachment; RD retinal detachment; DM diabetes mellitus; DR diabetic retinopathy; NPDR non-proliferative diabetic retinopathy; PDR proliferative diabetic retinopathy; CSME clinically significant macular edema; DME diabetic macular edema; dbh dot blot hemorrhages; CWS cotton wool spot; POAG primary open  angle glaucoma; C/D cup-to-disc ratio; HVF humphrey visual field; GVF goldmann visual field; OCT optical coherence tomography; IOP intraocular pressure; BRVO Branch retinal vein occlusion; CRVO central retinal vein occlusion; CRAO central retinal artery occlusion; BRAO branch retinal artery occlusion; RT retinal tear; SB scleral buckle; PPV pars plana vitrectomy; VH Vitreous hemorrhage; PRP panretinal laser photocoagulation; IVK intravitreal kenalog; VMT vitreomacular traction; MH Macular hole;  NVD neovascularization of the disc; NVE neovascularization  elsewhere; AREDS age related eye disease study; ARMD age related macular degeneration; POAG primary open angle glaucoma; EBMD epithelial/anterior basement membrane dystrophy; ACIOL anterior chamber intraocular lens; IOL intraocular lens; PCIOL posterior chamber intraocular lens; Phaco/IOL phacoemulsification with intraocular lens placement; [place] photorefractive keratectomy; LASIK laser assisted in situ keratomileusis; HTN hypertension; DM diabetes mellitus; COPD chronic obstructive pulmonary disease

## 2021-08-29 ENCOUNTER — Ambulatory Visit (INDEPENDENT_AMBULATORY_CARE_PROVIDER_SITE_OTHER): Payer: Medicare HMO | Admitting: Ophthalmology

## 2021-08-29 ENCOUNTER — Encounter (INDEPENDENT_AMBULATORY_CARE_PROVIDER_SITE_OTHER): Payer: Self-pay | Admitting: Ophthalmology

## 2021-08-29 DIAGNOSIS — E113313 Type 2 diabetes mellitus with moderate nonproliferative diabetic retinopathy with macular edema, bilateral: Secondary | ICD-10-CM

## 2021-08-29 DIAGNOSIS — I10 Essential (primary) hypertension: Secondary | ICD-10-CM

## 2021-08-29 DIAGNOSIS — H40039 Anatomical narrow angle, unspecified eye: Secondary | ICD-10-CM

## 2021-08-29 DIAGNOSIS — H35033 Hypertensive retinopathy, bilateral: Secondary | ICD-10-CM | POA: Diagnosis not present

## 2021-08-29 DIAGNOSIS — H4311 Vitreous hemorrhage, right eye: Secondary | ICD-10-CM | POA: Diagnosis not present

## 2021-08-29 DIAGNOSIS — Z961 Presence of intraocular lens: Secondary | ICD-10-CM | POA: Diagnosis not present

## 2021-08-31 ENCOUNTER — Encounter (INDEPENDENT_AMBULATORY_CARE_PROVIDER_SITE_OTHER): Payer: Self-pay | Admitting: Ophthalmology

## 2021-10-07 ENCOUNTER — Telehealth: Payer: Self-pay

## 2021-10-07 DIAGNOSIS — E1169 Type 2 diabetes mellitus with other specified complication: Secondary | ICD-10-CM | POA: Diagnosis not present

## 2021-10-07 DIAGNOSIS — Z0000 Encounter for general adult medical examination without abnormal findings: Secondary | ICD-10-CM | POA: Diagnosis not present

## 2021-10-07 DIAGNOSIS — I129 Hypertensive chronic kidney disease with stage 1 through stage 4 chronic kidney disease, or unspecified chronic kidney disease: Secondary | ICD-10-CM | POA: Diagnosis not present

## 2021-10-07 DIAGNOSIS — Z794 Long term (current) use of insulin: Secondary | ICD-10-CM | POA: Diagnosis not present

## 2021-10-07 DIAGNOSIS — E08649 Diabetes mellitus due to underlying condition with hypoglycemia without coma: Secondary | ICD-10-CM | POA: Diagnosis not present

## 2021-10-07 DIAGNOSIS — H8309 Labyrinthitis, unspecified ear: Secondary | ICD-10-CM | POA: Diagnosis not present

## 2021-10-07 NOTE — Telephone Encounter (Signed)
Dr. Fransico Setters office called states that patient is experiencing hypoglycemia @ night especially the last 3 nights under 70.

## 2021-10-25 ENCOUNTER — Encounter: Payer: Self-pay | Admitting: Endocrinology

## 2021-10-25 ENCOUNTER — Ambulatory Visit: Payer: Medicare HMO | Admitting: Endocrinology

## 2021-10-25 VITALS — BP 124/64 | HR 84 | Ht 66.0 in | Wt 182.4 lb

## 2021-10-25 DIAGNOSIS — E1165 Type 2 diabetes mellitus with hyperglycemia: Secondary | ICD-10-CM | POA: Diagnosis not present

## 2021-10-25 DIAGNOSIS — Z794 Long term (current) use of insulin: Secondary | ICD-10-CM | POA: Diagnosis not present

## 2021-10-25 LAB — MICROALBUMIN / CREATININE URINE RATIO
Creatinine,U: 233 mg/dL
Microalb Creat Ratio: 0.7 mg/g (ref 0.0–30.0)
Microalb, Ur: 1.7 mg/dL (ref 0.0–1.9)

## 2021-10-25 NOTE — Progress Notes (Signed)
Patient ID: Sandra Washington, female   DOB: Aug 09, 1940, 81 y.o.   MRN: [id number]   Reason for Appointment: follow-up   History of Present Illness   Diagnosis: Type 2 DIABETES MELITUS, date of diagnosis:   1995       She has been on insulin almost since her diagnosis and has had various regimens over the years More recently has been on basal bolus insulin with Lantus and Humalog with variable control, depending on her compliance with diet and exercise She was subsequently changed to NPH and regular insulin because of cost  Recent history:  Insulin regimen: Novolin N insulin 8 units a.m.-- 10 units at bedtime,  Novolin R 10-04-12 ac tid   Oral hypoglycemic drugs:none     Side effects from medications:  Diarrhea from Metformin  Her A1c is most recently 6.4   Current blood sugar patterns, management and problems: She was given the Dexcom sensor by her PCP but she stopped using it about a week or so ago because of frequent alerts for high and low readings at all different times especially overnight  Since she did not have the clarity app on her phone logged in unable to verify what her readings were She does not know when her blood sugars were high consistently She thinks occasionally her blood sugars may have been in the 50s also Her bedtime NPH was reduced by 2 units when her PCP office called She has a couple of readings over 200 postprandially and some of them may be related to eating sweets like ice cream She does not think she forgets her regular insulin before meals Again however she is a little inconsistent with recalling how much insulin she takes for her NPH Not trying to do any exercise Weight is about the same   Monitors blood glucose: Once a day or less .    Glucometer:  Accu-Chek  Blood Glucose readings    PRE-MEAL Fasting Lunch Dinner Bedtime Overall  Glucose range: 71-159 65-240 ? 95   Mean/median:     125   POST-MEAL PC Breakfast PC Lunch PC  Dinner  Glucose range:   59-228  Mean/median:      Previously  PRE-MEAL Fasting Lunch Dinner Bedtime Overall  Glucose range: 59-91 61, 124     Mean/median:     120   POST-MEAL PC Breakfast PC Lunch PC Dinner  Glucose range:   183  Mean/median:             Meals: 3 meals per day.  breakfast is usually at 6-7 AM: Cereal or egg/toast.  Lunch  12:30pm             Dietician visit: Most recent: 03/2008           Wt Readings from Last 3 Encounters:  10/25/21 182 lb 6.4 oz (82.7 kg)  08/09/21 186 lb 12.8 oz (84.7 kg)  04/07/21 187 lb 9.6 oz (85.1 kg)    Lab Results  Component Value Date   HGBA1C 6.4 (A) 08/09/2021   HGBA1C 6.7 (A) 04/07/2021   HGBA1C 6.2 (A) 09/23/2020   Lab Results  Component Value Date   MICROALBUR <0.7 04/21/2019   [place] 75 09/23/2020   CREATININE 0.74 04/07/2021    OTHER problems discussed today: See review of systems   No visits with results within 1 Week(s) from this visit.  Latest known visit with results is:  Office Visit on 08/09/2021  Component Date Value Ref Range Status  Hemoglobin A1C 08/09/2021 6.4 (A)  4.0 - 5.6 % Final    Allergies as of 10/25/2021   No Known Allergies      Medication List        Accurate as of October 25, 2021 10:04 AM. If you have any questions, ask your nurse or doctor.          Accu-Chek Aviva Plus test strip Generic drug: glucose blood Use to check blood sugar 2 times daily   Accu-Chek Guide Me w/Device Kit 1 each by Does not apply route 2 (two) times daily. Use accu chek guide me device to check blood sugar twice daily.DX:E11.65   Accu-Chek Softclix Lancets lancets Use bid   acetaminophen 500 MG tablet Commonly known as: TYLENOL Take 500-1,000 mg by mouth every 6 (six) hours as needed (for back aches).   amLODipine 5 MG tablet Commonly known as: NORVASC Take 5 mg by mouth 2 (two) times daily.   atorvastatin 20 MG tablet Commonly known as: LIPITOR Take 1 tablet (20 mg total) by mouth  daily. What changed: when to take this   insulin NPH Human 100 UNIT/ML injection Commonly known as: NovoLIN N ReliOn 8 U in am and 12 U hs   insulin regular 100 units/mL injection Commonly known as: NovoLIN R ReliOn INJECT 8 UNITS SUBCUTANEOUSLY WITH BREAKFAST AND LUNCH, AND 16 UNITS WITH DINNER   niacin 500 MG tablet Commonly known as: (VITAMIN B3) Take 500 mg by mouth at bedtime.   omeprazole 40 MG capsule Commonly known as: PRILOSEC Take 40 mg by mouth daily.   ondansetron 4 MG tablet Commonly known as: ZOFRAN Take 1 tablet (4 mg total) by mouth every 6 (six) hours as needed for nausea.   valsartan-hydrochlorothiazide 160-12.5 MG tablet Commonly known as: DIOVAN-HCT Take 1 tablet by mouth in the morning.   Vitamin D3 125 MCG (5000 UT) Caps Take 5,000 Units by mouth daily.        Allergies: No Known Allergies  Past Medical History:  Diagnosis Date   Complication of anesthesia    Diabetes mellitus without complication (HCC)    Diabetic retinopathy ([place])    GERD (gastroesophageal reflux disease)    History of kidney stones    Hypertension    Hypertensive retinopathy    Stroke [facility])    age 85- no residual    Past Surgical History:  Procedure Laterality Date   BREAST BIOPSY Left    CATARACT EXTRACTION Bilateral 2018   Dr. Tommy Rainwater   CHOLECYSTECTOMY N/A 07/01/2020   Procedure: LAPAROSCOPIC CHOLECYSTECTOMY;  Surgeon: Virl Cagey, MD;  Location: [facility];  Service: General;  Laterality: N/A;   EYE SURGERY     HAND SURGERY     IR RADIOLOGIST EVAL & MGMT  08/05/2020   NO PAST SURGERIES     PARS PLANA VITRECTOMY Right 02/17/2021   Procedure: PARS PLANA VITRECTOMY WITH 25 GAUGE;  Surgeon: Bernarda Caffey, MD;  Location: [place];  Service: Ophthalmology;  Laterality: Right;   PHOTOCOAGULATION WITH LASER Right 02/17/2021   Procedure: PHOTOCOAGULATION WITH LASER;  Surgeon: Bernarda Caffey, MD;  Location: [place];  Service: Ophthalmology;  Laterality: Right;    No  family history on file.  Social History:  reports that she has quit smoking. She has never used smokeless tobacco. She reports that she does not drink alcohol and does not use drugs.  Review of Systems:   HYPERTENSION:  she is on valsartan HCTZ and amlodipine Followed by PCP regularly   BP  Readings from Last 3 Encounters:  10/25/21 124/64  08/09/21 140/72  04/07/21 124/60    HYPERLIPIDEMIA:  Taking 20 mg atorvastatin and niacin 500 mg, Niacin has been prescribed by her PCP Last labs in 10/22 by PCP showed LDL 63  Risk factors: May have had a history of stroke  Lab Results  Component Value Date   CHOL 137 09/23/2020   CHOL 138 01/08/2020   CHOL 142 04/21/2019   Lab Results  Component Value Date   HDL 50.50 09/23/2020   HDL 56.50 01/08/2020   HDL 52.20 04/21/2019   Lab Results  Component Value Date   LDLCALC 75 09/23/2020   LDLCALC 72 01/08/2020   LDLCALC 78 04/21/2019   Lab Results  Component Value Date   TRIG 55.0 09/23/2020   TRIG 48.0 01/08/2020   TRIG 57.0 04/21/2019   Lab Results  Component Value Date   CHOLHDL 3 09/23/2020   CHOLHDL 2 01/08/2020   CHOLHDL 3 04/21/2019   No results found for: "LDLDIRECT"  Last diabetic foot exam in 7/22  No symptoms of numbness or tingling in the feet  She had diabetic eye exam in 6/22 with only a few microaneurysms present   Examination:   BP 124/64   Pulse 84   Ht 5' 6" (1.676 m)   Wt 182 lb 6.4 oz (82.7 kg)   SpO2 99%   BMI 29.44 kg/m   Body mass index is 29.44 kg/m.      ASSESSMENT/ PLAN:   Diabetes type 2, nonobese on insulin  See history of present illness for detailed discussion of  current management, blood sugar patterns and problems identified  She is on insulin using basal bolus generic NPH and regular insulins for cost reasons  A1c is 6.4 last  Again difficult to get her blood sugars evenly controlled and currently do not have the Dexcom data She does have some tendency to low  readings in the afternoon and has not reduced her morning NPH as directed Diet is also quite variable and inconsistent and may not always have complete meals  She will take insulin doses as follows  Novolin N insulin 6 units a.m.--10 units at bedtime,  Novolin R 8 am-8 lunch-14 at dinner    To check urine microalbumin   Patient Instructions  Reduce N insulin in am to 6, stay on 10 at nite  Check blood sugars on waking up 3-4 days a week  Also check blood sugars about 2 hours after meals and do this after different meals by rotation  Recommended blood sugar levels on waking up are 90-130 and about 2 hours after meal is 130-180  Please bring your blood sugar monitor to each visit, thank you      Elayne Snare 10/25/2021, 10:04 AM   Note: This office note was prepared with Dragon voice recognition system technology. Any transcriptional errors that result from this process are unintentional.

## 2021-10-25 NOTE — Patient Instructions (Signed)
Reduce N insulin in am to 6, stay on 10 at nite  Check blood sugars on waking up 3-4 days a week  Also check blood sugars about 2 hours after meals and do this after different meals by rotation  Recommended blood sugar levels on waking up are 90-130 and about 2 hours after meal is 130-180  Please bring your blood sugar monitor to each visit, thank you

## 2021-10-27 ENCOUNTER — Other Ambulatory Visit (HOSPITAL_COMMUNITY): Payer: Self-pay | Admitting: Family Medicine

## 2021-10-27 ENCOUNTER — Ambulatory Visit (HOSPITAL_COMMUNITY)
Admission: RE | Admit: 2021-10-27 | Discharge: 2021-10-27 | Disposition: A | Discharge status: Home / Self Care | Payer: Medicare HMO | Source: Ambulatory Visit | Attending: Family Medicine | Admitting: Family Medicine

## 2021-10-27 DIAGNOSIS — Z794 Long term (current) use of insulin: Secondary | ICD-10-CM | POA: Diagnosis not present

## 2021-10-27 DIAGNOSIS — M79604 Pain in right leg: Secondary | ICD-10-CM

## 2021-10-27 DIAGNOSIS — M7989 Other specified soft tissue disorders: Secondary | ICD-10-CM | POA: Diagnosis not present

## 2021-10-27 DIAGNOSIS — I10 Essential (primary) hypertension: Secondary | ICD-10-CM | POA: Diagnosis not present

## 2021-10-27 DIAGNOSIS — E1100 Type 2 diabetes mellitus with hyperosmolarity without nonketotic hyperglycemic-hyperosmolar coma (NKHHC): Secondary | ICD-10-CM | POA: Diagnosis not present

## 2021-10-27 NOTE — Progress Notes (Signed)
VASCULAR LAB    Right lower extremity venous duplex has been performed.  See CV proc for preliminary results.  Called report to Dr. Donetta Potts, [facility], RVT 10/27/2021, 2:17 PM

## 2021-11-08 DIAGNOSIS — R079 Chest pain, unspecified: Secondary | ICD-10-CM | POA: Diagnosis not present

## 2021-11-08 DIAGNOSIS — M79604 Pain in right leg: Secondary | ICD-10-CM | POA: Diagnosis not present

## 2021-11-17 DIAGNOSIS — E1169 Type 2 diabetes mellitus with other specified complication: Secondary | ICD-10-CM | POA: Diagnosis not present

## 2021-11-17 DIAGNOSIS — F064 Anxiety disorder due to known physiological condition: Secondary | ICD-10-CM | POA: Diagnosis not present

## 2021-11-17 DIAGNOSIS — M25561 Pain in right knee: Secondary | ICD-10-CM | POA: Diagnosis not present

## 2021-11-17 DIAGNOSIS — M545 Low back pain, unspecified: Secondary | ICD-10-CM | POA: Diagnosis not present

## 2021-11-22 NOTE — Progress Notes (Signed)
[facility] Note  11/28/2021    CHIEF COMPLAINT Patient presents for Retina Follow Up   HISTORY OF PRESENT ILLNESS: Sandra Washington is a 81 y.o. female who presents to the clinic today for:   HPI     Retina Follow Up   Patient presents with  Other.  In right eye.  This started months ago.  Severity is moderate.  Duration of 3 months.  Since onset it is stable.  I, the attending physician,  performed the HPI with the patient and updated documentation appropriately.        Comments   Patient denies noticing any vision changes at this time. Her blood sugar was 216 and her A1C is 6.4.      Last edited by Bernarda Caffey, MD on 11/28/2021  1:10 PM.     Pt states she has been on prednisone bc of her legs, she states she had a x-ray and was told that the muscles in her legs are "collapsing", she states she also has a pulled muscle in her chest that has radiated to the back of her neck, eyes seem to be doing okay  Referring physician: Lucianne Lei, MD [address] [place],  [place] [address]  HISTORICAL INFORMATION:   Selected notes from the MEDICAL RECORD NUMBER Referred by Dr. Madelin Headings for concern of diabetic retinopathy   CURRENT MEDICATIONS: No current outpatient medications on file. (Ophthalmic Drugs)   No current facility-administered medications for this visit. (Ophthalmic Drugs)   Current Outpatient Medications (Other)  Medication Sig   ACCU-CHEK SOFTCLIX LANCETS lancets Use bid   acetaminophen (TYLENOL) 500 MG tablet Take 500-1,000 mg by mouth every 6 (six) hours as needed (for back aches).   amLODipine (NORVASC) 5 MG tablet Take 5 mg by mouth 2 (two) times daily.   atorvastatin (LIPITOR) 20 MG tablet Take 1 tablet (20 mg total) by mouth daily. (Patient taking differently: Take 20 mg by mouth at bedtime.)   Blood Glucose Monitoring Suppl (ACCU-CHEK GUIDE ME) w/Device KIT 1 each by Does not apply route 2 (two) times daily. Use  accu chek guide me device to check blood sugar twice daily.DX:E11.65   Cholecalciferol (VITAMIN D3) 5000 UNITS CAPS Take 5,000 Units by mouth daily.   glucose blood (ACCU-CHEK AVIVA PLUS) test strip Use to check blood sugar 2 times daily   insulin NPH Human (NOVOLIN N RELION) 100 UNIT/ML injection 8 U in am and 12 U hs   insulin regular (NOVOLIN R RELION) 100 units/mL injection INJECT 8 UNITS SUBCUTANEOUSLY WITH BREAKFAST AND LUNCH, AND 16 UNITS WITH DINNER   niacin (SLO-NIACIN) 500 MG tablet Take 500 mg by mouth at bedtime.   omeprazole (PRILOSEC) 40 MG capsule Take 40 mg by mouth daily.   ondansetron (ZOFRAN) 4 MG tablet Take 1 tablet (4 mg total) by mouth every 6 (six) hours as needed for nausea.   valsartan-hydrochlorothiazide (DIOVAN-HCT) 160-12.5 MG tablet Take 1 tablet by mouth in the morning.   No current facility-administered medications for this visit. (Other)   REVIEW OF SYSTEMS: ROS   Positive for: Gastrointestinal, Neurological, Endocrine, Eyes Negative for: Constitutional, Skin, Genitourinary, Musculoskeletal, HENT, Cardiovascular, Respiratory, Allergic/Imm, Heme/Lymph Last edited by Annie Paras, COT on 11/28/2021 10:34 AM.     ALLERGIES No Known Allergies  PAST MEDICAL HISTORY Past Medical History:  Diagnosis Date   Complication of anesthesia    Diabetes mellitus without complication ([place])    Diabetic retinopathy ([place])  GERD (gastroesophageal reflux disease)    History of kidney stones    Hypertension    Hypertensive retinopathy    Stroke [facility])    age 35- no residual   Past Surgical History:  Procedure Laterality Date   BREAST BIOPSY Left    CATARACT EXTRACTION Bilateral 2018   Dr. Tommy Rainwater   CHOLECYSTECTOMY N/A 07/01/2020   Procedure: LAPAROSCOPIC CHOLECYSTECTOMY;  Surgeon: Virl Cagey, MD;  Location: [facility];  Service: General;  Laterality: N/A;   EYE SURGERY     HAND SURGERY     IR RADIOLOGIST EVAL & MGMT  08/05/2020   NO PAST SURGERIES      PARS PLANA VITRECTOMY Right 02/17/2021   Procedure: PARS PLANA VITRECTOMY WITH 25 GAUGE;  Surgeon: Bernarda Caffey, MD;  Location: [facility];  Service: Ophthalmology;  Laterality: Right;   PHOTOCOAGULATION WITH LASER Right 02/17/2021   Procedure: PHOTOCOAGULATION WITH LASER;  Surgeon: Bernarda Caffey, MD;  Location: [facility];  Service: Ophthalmology;  Laterality: Right;   FAMILY HISTORY History reviewed. No pertinent family history.  SOCIAL HISTORY Social History   Tobacco Use   Smoking status: Former   Smokeless tobacco: Never   Tobacco comments:    Smoked in [employer]  Vaping Use   Vaping Use: Former  Substance Use Topics   Alcohol use: No   Drug use: No       OPHTHALMIC EXAM:  Base Eye Exam     Visual Acuity (Snellen - Linear)       Right Left   Dist cc 20/25 -1 20/25    Correction: Glasses         Tonometry (Tonopen, 9:18 AM)       Right Left   Pressure 13 13         Pupils       Dark Light Shape React APD   Right 3 2 Round Sluggish None   Left 3 2 Round Sluggish None         Visual Fields       Left Right    Full Full         Extraocular Movement       Right Left    Full, Ortho Full, Ortho         Neuro/Psych     Oriented x3: Yes   Mood/Affect: Normal         Dilation     Both eyes: 1.0% Mydriacyl, 2.5% Phenylephrine @ 9:13 AM           Slit Lamp and Fundus Exam     Slit Lamp Exam       Right Left   Lids/Lashes Dermatochalasis - upper lid, Meibomian gland dysfunction Dermatochalasis - upper lid, Meibomian gland dysfunction   Conjunctiva/Sclera Melanosis nasal and temporal Pinguecula, Melanosis   Cornea Arcus, Well healed temporal cataract wounds, trace PEE, trace EBMD Arcus, Well healed temporal cataract wounds, trace EBMD   Anterior Chamber deep, clear, narrow temporal angle, No cell or flare deep and clear   Iris Round and moderately dilated, focal iris atrophy at 1100  --  PI not open, No NVI Round and moderately  dilated, small patent PI at 0200, mild bowing, No NVI   Lens PC IOL in good position with open PC PC IOL in good position with open PC   Anterior Vitreous post vitrectomy, VH cleared Vitreous syneresis         Fundus Exam       Right Left  Disc mild Pallor, Sharp rim, mild PPA/PPP Pink and Sharp, Compact, mild PPP   C/D Ratio 0.2 0.2   Macula Flat, Blunted foveal reflex, mild RPE mottling, focal drusen SN to fovea, no edema, scattered MA and cystic changes greatest temporal mac, good focal laser targets ST mac Flat, Blunted foveal reflex, scattered Microaneurysms   Vessels attenuated, mild copper wiring, mild tortuosity attenuated, Tortuous   Periphery Attached, scattered MA/DBH, 360 peripheral laser changes greatest temporally, room for fill in if needed Attached, scattered IRH           Refraction     Wearing Rx       Sphere Cylinder Axis Add   Right -2.25 +2.25 180 +2.50   Left -1.00 +1.50 175 +2.50    Type: prog           IMAGING AND PROCEDURES  Imaging and Procedures for _0 @  OCT, Retina - OU - Both Eyes       Right Eye Quality was good. Central Foveal Thickness: 281. Progression has improved. Findings include normal foveal contour, no SRF, intraretinal fluid (Mild persistent IRF / cystic changes temporal mac and fovea -- slightly improved).   Left Eye Quality was good. Central Foveal Thickness: 230. Progression has improved. Findings include normal foveal contour, no IRF, no SRF, intraretinal hyper-reflective material (Interval improvement / resolution of cystic changes superior fovea ).   Notes *Images captured and stored on drive  Diagnosis / Impression:  OD: Mild persistent IRF / cystic changes temporal mac and fovea -- slightly improved OS: Interval improvement  / resolution of cystic changes superior fovea   Clinical management:  See below  Abbreviations: NFP - Normal foveal profile. CME - cystoid macular edema. PED - pigment epithelial  detachment. IRF - intraretinal fluid. SRF - subretinal fluid. EZ - ellipsoid zone. ERM - epiretinal membrane. ORA - outer retinal atrophy. ORT - outer retinal tubulation. SRHM - subretinal hyper-reflective material            ASSESSMENT/PLAN:    ICD-10-CM   1. Vitreous hemorrhage of right eye (HCC)  H43.11 OCT, Retina - OU - Both Eyes    2. Moderate nonproliferative diabetic retinopathy of both eyes with macular edema associated with type 2 diabetes mellitus (HCC)  E11.3313 OCT, Retina - OU - Both Eyes    3. Essential hypertension  I10     4. Hypertensive retinopathy of both eyes  H35.033     5. Pseudophakia of both eyes  Z96.1     6. Anatomical narrow angle  H40.039      Vitreous Hemorrhage OD - onset of floaters and vision loss beginning of Dec 2022 while putting up Christmas decorations - likely related to history of DM and HTN - pt also recalls multiple falls from vertigo around the same time of VH onset - s/p IVA OD #1 (12.08.22) - no significant improvement - pre op BCVA OD -- CF 1'  - s/p PPV/EL 12.22.22 OD - intra-op -- visualized peripheral ischemia and scattered DBH; no RT/RD - doing great - BCVA 20/25 OD from CF -- stable             - IOP 13 - off all drops  - monitor   2. Moderate nonproliferative diabetic retinopathy w/ DME, OU  - FA 07.03.23 shows: OD Focal leakage temporal macula, scattered leaking MA, staining of peripheral laser, OS Mild peripheral nonperfusion nasal periphery, scattered MA with late leakage, no NV OU - exam with scattered MA OU --  OD w/ h/o VH as above  - BCVA 20/25 OU - stable  - OCT shows OD: Mild persistent IRF / cystic changes temporal mac and fovea -- slightly improved; OS: Interval improvement  / resolution of cystic changes superior fovea   - discussed tight BP control and BS control   - discussed findings, prognosis  - recommend continued monitoring   - f/u 6 months DFE, OCT  3,4. Hypertensive retinopathy OU  - discussed  importance of tight BP control  - monitor  5. Pseudophakia OU  - s/p CE/IOL OU  - beautiful surgeries by Dr. Talbert Forest, doing well  - monitor  6. Anatomical Narrow Angles OU  - s/p LPI OU by Dr. Talbert Forest  - OS patent LPI and open angle  - OD w/ closed PI  - saw Dr. Nancy Fetter on 09.21.20--- no intervention recommended   Ophthalmic Meds Ordered this visit:  No orders of the defined types were placed in this encounter.    Return in about 6 months (around 05/30/2022) for f/u NPDR OU, DFE, OCT.  There are no Patient Instructions on file for this visit.   Explained the diagnoses, plan, and follow up with the patient and they expressed understanding.  Patient expressed understanding of the importance of proper follow up care.   This document serves as a record of services personally performed by Gardiner Sleeper, MD, PhD. It was created on their behalf by San Jetty. Owens Shark, OA an ophthalmic technician. The creation of this record is the provider's dictation and/or activities during the visit.    Electronically signed by: San Jetty. Owens Shark, [place] 10.02.2023 1:10 PM   Gardiner Sleeper, M.D., Ph.D. Diseases & Surgery of the Retina and Vitreous [facility]  I have reviewed the above documentation for accuracy and completeness, and I agree with the above. Gardiner Sleeper, M.D., Ph.D. 11/28/21 1:11 PM  Abbreviations: M myopia (nearsighted); A astigmatism; H hyperopia (farsighted); P presbyopia; Mrx spectacle prescription;  CTL contact lenses; OD right eye; OS left eye; OU both eyes  XT exotropia; ET esotropia; PEK punctate epithelial keratitis; PEE punctate epithelial erosions; DES dry eye syndrome; MGD meibomian gland dysfunction; ATs artificial tears; PFAT's preservative free artificial tears; [place] nuclear sclerotic cataract; PSC posterior subcapsular cataract; ERM epi-retinal membrane; PVD posterior vitreous detachment; RD retinal detachment; DM diabetes mellitus; DR diabetic retinopathy;  NPDR non-proliferative diabetic retinopathy; PDR proliferative diabetic retinopathy; CSME clinically significant macular edema; DME diabetic macular edema; dbh dot blot hemorrhages; CWS cotton wool spot; POAG primary open angle glaucoma; C/D cup-to-disc ratio; HVF humphrey visual field; GVF goldmann visual field; OCT optical coherence tomography; IOP intraocular pressure; BRVO Branch retinal vein occlusion; CRVO central retinal vein occlusion; CRAO central retinal artery occlusion; BRAO branch retinal artery occlusion; RT retinal tear; SB scleral buckle; PPV pars plana vitrectomy; VH Vitreous hemorrhage; PRP panretinal laser photocoagulation; IVK intravitreal kenalog; VMT vitreomacular traction; MH Macular hole;  NVD neovascularization of the disc; NVE neovascularization elsewhere; AREDS age related eye disease study; ARMD age related macular degeneration; POAG primary open angle glaucoma; EBMD epithelial/anterior basement membrane dystrophy; ACIOL anterior chamber intraocular lens; IOL intraocular lens; PCIOL posterior chamber intraocular lens; Phaco/IOL phacoemulsification with intraocular lens placement; [place] photorefractive keratectomy; LASIK laser assisted in situ keratomileusis; HTN hypertension; DM diabetes mellitus; COPD chronic obstructive pulmonary disease

## 2021-11-26 DIAGNOSIS — E1100 Type 2 diabetes mellitus with hyperosmolarity without nonketotic hyperglycemic-hyperosmolar coma (NKHHC): Secondary | ICD-10-CM | POA: Diagnosis not present

## 2021-11-26 DIAGNOSIS — I10 Essential (primary) hypertension: Secondary | ICD-10-CM | POA: Diagnosis not present

## 2021-11-28 ENCOUNTER — Ambulatory Visit (INDEPENDENT_AMBULATORY_CARE_PROVIDER_SITE_OTHER): Payer: Medicare HMO | Admitting: Ophthalmology

## 2021-11-28 ENCOUNTER — Encounter (INDEPENDENT_AMBULATORY_CARE_PROVIDER_SITE_OTHER): Payer: Self-pay | Admitting: Ophthalmology

## 2021-11-28 DIAGNOSIS — H40039 Anatomical narrow angle, unspecified eye: Secondary | ICD-10-CM

## 2021-11-28 DIAGNOSIS — I10 Essential (primary) hypertension: Secondary | ICD-10-CM | POA: Diagnosis not present

## 2021-11-28 DIAGNOSIS — H35033 Hypertensive retinopathy, bilateral: Secondary | ICD-10-CM

## 2021-11-28 DIAGNOSIS — E113393 Type 2 diabetes mellitus with moderate nonproliferative diabetic retinopathy without macular edema, bilateral: Secondary | ICD-10-CM

## 2021-11-28 DIAGNOSIS — Z961 Presence of intraocular lens: Secondary | ICD-10-CM | POA: Diagnosis not present

## 2021-11-28 DIAGNOSIS — H4311 Vitreous hemorrhage, right eye: Secondary | ICD-10-CM | POA: Diagnosis not present

## 2021-11-28 DIAGNOSIS — E113313 Type 2 diabetes mellitus with moderate nonproliferative diabetic retinopathy with macular edema, bilateral: Secondary | ICD-10-CM

## 2021-12-01 DIAGNOSIS — M545 Low back pain, unspecified: Secondary | ICD-10-CM | POA: Diagnosis not present

## 2021-12-01 DIAGNOSIS — M2560 Stiffness of unspecified joint, not elsewhere classified: Secondary | ICD-10-CM | POA: Diagnosis not present

## 2021-12-01 DIAGNOSIS — M542 Cervicalgia: Secondary | ICD-10-CM | POA: Diagnosis not present

## 2021-12-01 DIAGNOSIS — M6281 Muscle weakness (generalized): Secondary | ICD-10-CM | POA: Diagnosis not present

## 2021-12-06 ENCOUNTER — Ambulatory Visit (HOSPITAL_COMMUNITY): Payer: Medicare HMO | Admitting: Physical Therapy

## 2021-12-09 ENCOUNTER — Other Ambulatory Visit: Payer: Self-pay | Admitting: Family Medicine

## 2021-12-09 ENCOUNTER — Other Ambulatory Visit: Payer: Self-pay | Admitting: Behavioral Health

## 2021-12-09 ENCOUNTER — Ambulatory Visit
Admission: RE | Admit: 2021-12-09 | Discharge: 2021-12-09 | Disposition: A | Discharge status: Home / Self Care | Payer: Medicare HMO | Source: Ambulatory Visit | Attending: Family Medicine | Admitting: Family Medicine

## 2021-12-09 DIAGNOSIS — R634 Abnormal weight loss: Secondary | ICD-10-CM | POA: Diagnosis not present

## 2021-12-09 DIAGNOSIS — M545 Low back pain, unspecified: Secondary | ICD-10-CM

## 2021-12-09 DIAGNOSIS — R52 Pain, unspecified: Secondary | ICD-10-CM

## 2021-12-09 DIAGNOSIS — M79671 Pain in right foot: Secondary | ICD-10-CM | POA: Diagnosis not present

## 2021-12-09 DIAGNOSIS — M79604 Pain in right leg: Secondary | ICD-10-CM | POA: Diagnosis not present

## 2021-12-29 DIAGNOSIS — N189 Chronic kidney disease, unspecified: Secondary | ICD-10-CM | POA: Diagnosis not present

## 2021-12-29 DIAGNOSIS — F064 Anxiety disorder due to known physiological condition: Secondary | ICD-10-CM | POA: Diagnosis not present

## 2021-12-29 DIAGNOSIS — I129 Hypertensive chronic kidney disease with stage 1 through stage 4 chronic kidney disease, or unspecified chronic kidney disease: Secondary | ICD-10-CM | POA: Diagnosis not present

## 2021-12-29 DIAGNOSIS — E782 Mixed hyperlipidemia: Secondary | ICD-10-CM | POA: Diagnosis not present

## 2022-01-23 ENCOUNTER — Ambulatory Visit
Admission: RE | Admit: 2022-01-23 | Discharge: 2022-01-23 | Disposition: A | Discharge status: Home / Self Care | Payer: Medicare HMO | Source: Ambulatory Visit | Attending: Family Medicine | Admitting: Family Medicine

## 2022-01-23 ENCOUNTER — Other Ambulatory Visit: Payer: Self-pay | Admitting: Family Medicine

## 2022-01-23 ENCOUNTER — Encounter: Payer: Self-pay | Admitting: Family Medicine

## 2022-01-23 DIAGNOSIS — E1169 Type 2 diabetes mellitus with other specified complication: Secondary | ICD-10-CM | POA: Diagnosis not present

## 2022-01-23 DIAGNOSIS — S99921A Unspecified injury of right foot, initial encounter: Secondary | ICD-10-CM

## 2022-01-23 DIAGNOSIS — S92511A Displaced fracture of proximal phalanx of right lesser toe(s), initial encounter for closed fracture: Secondary | ICD-10-CM | POA: Diagnosis not present

## 2022-01-23 DIAGNOSIS — R634 Abnormal weight loss: Secondary | ICD-10-CM | POA: Diagnosis not present

## 2022-01-23 DIAGNOSIS — S92331D Displaced fracture of third metatarsal bone, right foot, subsequent encounter for fracture with routine healing: Secondary | ICD-10-CM | POA: Diagnosis not present

## 2022-01-26 ENCOUNTER — Ambulatory Visit: Payer: Medicare HMO | Admitting: Podiatry

## 2022-01-26 DIAGNOSIS — S92514A Nondisplaced fracture of proximal phalanx of right lesser toe(s), initial encounter for closed fracture: Secondary | ICD-10-CM

## 2022-01-26 NOTE — Progress Notes (Signed)
  Subjective:  Patient ID: Sandra Washington, female    DOB: Jun 21, 1940,  MRN: [id number]  Chief Complaint  Patient presents with   Injury    Patient is here for right foot 3rd toe, the patient states that she hit her toe 2 weeks ago and is here for follow-up she has had x-rays  done at [facility].    81 y.o. female presents with concern for pain in the right third toe.  She says that she had it approximately 2 weeks ago.  She kicked her right foot against something inadvertently.  She had pain and swelling to the right third toe.  Had x-rays done and was told it was fractured.  She was referred here for further management.  She says that in the last 2 weeks the pain has decreased as has the swelling.  She is walking without too much pain but does have some walking with her regular shoes.  She has also been taping the toes together for pain control.  Past Medical History:  Diagnosis Date   Complication of anesthesia    Diabetes mellitus without complication (HCC)    Diabetic retinopathy ([place])    GERD (gastroesophageal reflux disease)    History of kidney stones    Hypertension    Hypertensive retinopathy    Stroke [facility])    age 54- no residual    No Known Allergies  ROS: Negative except as per HPI above  Objective:  General: AAO x3, NAD  Dermatological: No open wound present to the right third toe  Vascular:  Dorsalis Pedis artery and Posterior Tibial artery pedal pulses are 2/4 bilateral.  Capillary fill time < 3 sec to all digits.   Neruologic: Grossly intact via light touch bilateral. Protective threshold intact to all sites bilateral.   Musculoskeletal: Edema and pain on palpation noted to the third toe proximal phalanx area however not severe.  Gait: Antalgic  No images are attached to the encounter.  Radiographs: XR third toe right foot 01/23/2022 Osseous structures are osteopenic. There is a fracture of the mid cortex third proximal phalanx displaced by  couple of mm. Osseous structures are otherwise intact. There are interphalangeal degenerative changes.  Assessment:   1. Closed nondisplaced fracture of proximal phalanx of lesser toe of right foot, initial encounter      Plan:  Patient was evaluated and treated and all questions answered.  #Minimally displaced fracture of the proximal phalanx of the third toe on the right foot -Discussed with the patient that she does have a fracture of the third toe proximal phalanx however it is self-limiting and should heal well with nonoperative management. -Recommend use of a postoperative shoe which was dispensed to the patient at this visit for pain control and to allow the fracture to heal without excessive motion when she is ambulating. -Recommend she can use buddy taping of the third toe to the second toe as needed for pain control but she does not have to do this if it is not painful for her. -Recommend she use the postoperative shoe for the next couple weeks but can stop using it if her pain goes away completely when ambulating.  Return in about 4 weeks (around 02/23/2022) for Follow-up right third toe fracture.          Everitt Amber, DPM [facility]

## 2022-02-02 ENCOUNTER — Other Ambulatory Visit (HOSPITAL_COMMUNITY): Payer: Self-pay | Admitting: Family Medicine

## 2022-02-02 DIAGNOSIS — Z1231 Encounter for screening mammogram for malignant neoplasm of breast: Secondary | ICD-10-CM

## 2022-02-09 ENCOUNTER — Ambulatory Visit: Payer: Medicare HMO | Admitting: Endocrinology

## 2022-02-24 ENCOUNTER — Ambulatory Visit (HOSPITAL_COMMUNITY)
Admission: RE | Admit: 2022-02-24 | Discharge: 2022-02-24 | Disposition: A | Discharge status: Home / Self Care | Payer: Medicare HMO | Source: Ambulatory Visit | Attending: Family Medicine | Admitting: Family Medicine

## 2022-02-24 DIAGNOSIS — Z1231 Encounter for screening mammogram for malignant neoplasm of breast: Secondary | ICD-10-CM | POA: Insufficient documentation

## 2022-02-25 DIAGNOSIS — I10 Essential (primary) hypertension: Secondary | ICD-10-CM | POA: Diagnosis not present

## 2022-02-25 DIAGNOSIS — E1100 Type 2 diabetes mellitus with hyperosmolarity without nonketotic hyperglycemic-hyperosmolar coma (NKHHC): Secondary | ICD-10-CM | POA: Diagnosis not present

## 2022-02-25 DIAGNOSIS — Z794 Long term (current) use of insulin: Secondary | ICD-10-CM | POA: Diagnosis not present

## 2022-03-02 ENCOUNTER — Ambulatory Visit: Payer: Medicare HMO | Admitting: Podiatry

## 2022-03-02 DIAGNOSIS — I10 Essential (primary) hypertension: Secondary | ICD-10-CM | POA: Diagnosis not present

## 2022-03-02 DIAGNOSIS — R55 Syncope and collapse: Secondary | ICD-10-CM | POA: Diagnosis not present

## 2022-03-02 DIAGNOSIS — Z794 Long term (current) use of insulin: Secondary | ICD-10-CM | POA: Diagnosis not present

## 2022-03-02 DIAGNOSIS — E1165 Type 2 diabetes mellitus with hyperglycemia: Secondary | ICD-10-CM | POA: Diagnosis not present

## 2022-03-06 DIAGNOSIS — Z794 Long term (current) use of insulin: Secondary | ICD-10-CM | POA: Diagnosis not present

## 2022-03-06 DIAGNOSIS — I10 Essential (primary) hypertension: Secondary | ICD-10-CM | POA: Diagnosis not present

## 2022-03-06 DIAGNOSIS — E1165 Type 2 diabetes mellitus with hyperglycemia: Secondary | ICD-10-CM | POA: Diagnosis not present

## 2022-03-09 DIAGNOSIS — Z794 Long term (current) use of insulin: Secondary | ICD-10-CM | POA: Diagnosis not present

## 2022-03-09 DIAGNOSIS — E1165 Type 2 diabetes mellitus with hyperglycemia: Secondary | ICD-10-CM | POA: Diagnosis not present

## 2022-03-20 DIAGNOSIS — E1165 Type 2 diabetes mellitus with hyperglycemia: Secondary | ICD-10-CM | POA: Diagnosis not present

## 2022-03-20 DIAGNOSIS — Z794 Long term (current) use of insulin: Secondary | ICD-10-CM | POA: Diagnosis not present

## 2022-03-30 DIAGNOSIS — E1165 Type 2 diabetes mellitus with hyperglycemia: Secondary | ICD-10-CM | POA: Diagnosis not present

## 2022-04-03 DIAGNOSIS — Z794 Long term (current) use of insulin: Secondary | ICD-10-CM | POA: Diagnosis not present

## 2022-04-03 DIAGNOSIS — E785 Hyperlipidemia, unspecified: Secondary | ICD-10-CM | POA: Diagnosis not present

## 2022-04-03 DIAGNOSIS — E559 Vitamin D deficiency, unspecified: Secondary | ICD-10-CM | POA: Diagnosis not present

## 2022-04-03 DIAGNOSIS — E1169 Type 2 diabetes mellitus with other specified complication: Secondary | ICD-10-CM | POA: Diagnosis not present

## 2022-04-03 DIAGNOSIS — I10 Essential (primary) hypertension: Secondary | ICD-10-CM | POA: Diagnosis not present

## 2022-04-03 DIAGNOSIS — E782 Mixed hyperlipidemia: Secondary | ICD-10-CM | POA: Diagnosis not present

## 2022-04-03 DIAGNOSIS — Z6828 Body mass index (BMI) 28.0-28.9, adult: Secondary | ICD-10-CM | POA: Diagnosis not present

## 2022-04-03 DIAGNOSIS — E1165 Type 2 diabetes mellitus with hyperglycemia: Secondary | ICD-10-CM | POA: Diagnosis not present

## 2022-04-07 DIAGNOSIS — E1165 Type 2 diabetes mellitus with hyperglycemia: Secondary | ICD-10-CM | POA: Diagnosis not present

## 2022-04-25 ENCOUNTER — Ambulatory Visit: Payer: Medicare HMO | Admitting: Endocrinology

## 2022-05-01 DIAGNOSIS — K625 Hemorrhage of anus and rectum: Secondary | ICD-10-CM | POA: Diagnosis not present

## 2022-05-01 DIAGNOSIS — I10 Essential (primary) hypertension: Secondary | ICD-10-CM | POA: Diagnosis not present

## 2022-05-01 DIAGNOSIS — E1169 Type 2 diabetes mellitus with other specified complication: Secondary | ICD-10-CM | POA: Diagnosis not present

## 2022-05-26 NOTE — Progress Notes (Signed)
[facility] Note  05/29/2022    CHIEF COMPLAINT Patient presents for Retina Follow Up   HISTORY OF PRESENT ILLNESS: Sandra Washington is a 82 y.o. female who presents to the clinic today for:   HPI     Retina Follow Up   Patient presents with  Other.  In right eye.  Severity is moderate.  Duration of 6 months.  Since onset it is stable.  I, the attending physician,  performed the HPI with the patient and updated documentation appropriately.        Comments   Pt here for 6 mo ret f/u VH OD. Pt states VA about the same.       Last edited by Bernarda Caffey, MD on 05/29/2022 12:09 PM.    Pt states vision is doing well, she thinks she should get new glasses, she states her blood sugar and blood pressure are under control    Referring physician: Madelin Headings, DO [address] [place],  [place] [address]  HISTORICAL INFORMATION:   Selected notes from the MEDICAL RECORD NUMBER Referred by Dr. Madelin Headings for concern of diabetic retinopathy   CURRENT MEDICATIONS: No current outpatient medications on file. (Ophthalmic Drugs)   No current facility-administered medications for this visit. (Ophthalmic Drugs)   Current Outpatient Medications (Other)  Medication Sig   ACCU-CHEK SOFTCLIX LANCETS lancets Use bid   acetaminophen (TYLENOL) 500 MG tablet Take 500-1,000 mg by mouth every 6 (six) hours as needed (for back aches).   amLODipine (NORVASC) 5 MG tablet Take 5 mg by mouth 2 (two) times daily.   atorvastatin (LIPITOR) 20 MG tablet Take 1 tablet (20 mg total) by mouth daily. (Patient taking differently: Take 20 mg by mouth at bedtime.)   Blood Glucose Monitoring Suppl (ACCU-CHEK GUIDE ME) w/Device KIT 1 each by Does not apply route 2 (two) times daily. Use accu chek guide me device to check blood sugar twice daily.DX:E11.65   Cholecalciferol (VITAMIN D3) 5000 UNITS CAPS Take 5,000 Units by mouth daily.   glucose blood (ACCU-CHEK AVIVA PLUS) test  strip Use to check blood sugar 2 times daily   insulin NPH Human (NOVOLIN N RELION) 100 UNIT/ML injection 8 U in am and 12 U hs   insulin regular (NOVOLIN R RELION) 100 units/mL injection INJECT 8 UNITS SUBCUTANEOUSLY WITH BREAKFAST AND LUNCH, AND 16 UNITS WITH DINNER   niacin (SLO-NIACIN) 500 MG tablet Take 500 mg by mouth at bedtime.   omeprazole (PRILOSEC) 40 MG capsule Take 40 mg by mouth daily.   valsartan-hydrochlorothiazide (DIOVAN-HCT) 160-12.5 MG tablet Take 1 tablet by mouth in the morning.   ondansetron (ZOFRAN) 4 MG tablet Take 1 tablet (4 mg total) by mouth every 6 (six) hours as needed for nausea. (Patient not taking: Reported on 05/29/2022)   No current facility-administered medications for this visit. (Other)   REVIEW OF SYSTEMS: ROS   Positive for: Gastrointestinal, Neurological, Endocrine, Eyes Negative for: Constitutional, Skin, Genitourinary, Musculoskeletal, HENT, Cardiovascular, Respiratory, Allergic/Imm, Heme/Lymph Last edited by Kingsley Spittle, COT on 05/29/2022  8:56 AM.      ALLERGIES No Known Allergies  PAST MEDICAL HISTORY Past Medical History:  Diagnosis Date   Complication of anesthesia    Diabetes mellitus without complication    Diabetic retinopathy    GERD (gastroesophageal reflux disease)    History of kidney stones    Hypertension    Hypertensive retinopathy    Stroke    age 51- no residual  Past Surgical History:  Procedure Laterality Date   BREAST BIOPSY Left    CATARACT EXTRACTION Bilateral 2018   Dr. Tommy Rainwater   CHOLECYSTECTOMY N/A 07/01/2020   Procedure: LAPAROSCOPIC CHOLECYSTECTOMY;  Surgeon: Virl Cagey, MD;  Location: [facility];  Service: General;  Laterality: N/A;   EYE SURGERY     HAND SURGERY     IR RADIOLOGIST EVAL & MGMT  08/05/2020   NO PAST SURGERIES     PARS PLANA VITRECTOMY Right 02/17/2021   Procedure: PARS PLANA VITRECTOMY WITH 25 GAUGE;  Surgeon: Bernarda Caffey, MD;  Location: [facility];  Service: Ophthalmology;   Laterality: Right;   PHOTOCOAGULATION WITH LASER Right 02/17/2021   Procedure: PHOTOCOAGULATION WITH LASER;  Surgeon: Bernarda Caffey, MD;  Location: [facility];  Service: Ophthalmology;  Laterality: Right;   FAMILY HISTORY History reviewed. No pertinent family history.  SOCIAL HISTORY Social History   Tobacco Use   Smoking status: Former   Smokeless tobacco: Never   Tobacco comments:    Smoked in [employer]  Vaping Use   Vaping Use: Former  Substance Use Topics   Alcohol use: No   Drug use: No       OPHTHALMIC EXAM:  Base Eye Exam     Visual Acuity (Snellen - Linear)       Right Left   Dist cc 20/25 20/30 -2   Dist ph cc NI 20/25 -2    Correction: Glasses         Tonometry (Tonopen, 9:02 AM)       Right Left   Pressure 11 10         Pupils       Pupils Dark Light Shape React APD   Right PERRL 3 2 Round Slow None   Left PERRL 3 2 Round Slow None         Visual Fields (Counting fingers)       Left Right    Full Full         Extraocular Movement       Right Left    Full, Ortho Full, Ortho         Neuro/Psych     Oriented x3: Yes   Mood/Affect: Normal         Dilation     Both eyes: 1.0% Mydriacyl, 2.5% Phenylephrine @ 9:03 AM           Slit Lamp and Fundus Exam     Slit Lamp Exam       Right Left   Lids/Lashes Dermatochalasis - upper lid Dermatochalasis - upper lid, Meibomian gland dysfunction   Conjunctiva/Sclera mild melanosis, nasal and temporal pinguecula nasal and temporal Pinguecula, Melanosis   Cornea Arcus, Well healed temporal cataract wounds, trace PEE Arcus, Well healed temporal cataract wounds, trace PEE   Anterior Chamber deep, clear, narrow temporal angle, No cell or flare deep and clear   Iris Round and dilated, focal iris atrophy at 1100  --  PI not open, No NVI Round and dilated, small patent PI at 0200, mild bowing, No NVI   Lens PC IOL in good position with open PC PC IOL in good position with open PC    Anterior Vitreous post vitrectomy, clear Vitreous syneresis         Fundus Exam       Right Left   Disc mild Pallor, Sharp rim, mild PPA/PPP Pink and Sharp, Compact, mild PPP   C/D Ratio 0.2 0.2   Macula Flat,  Blunted foveal reflex, mild RPE mottling, focal drusen SN to fovea, no edema, scattered MA/DBH and cystic changes greatest temporal mac, good focal laser targets ST mac Flat, Blunted foveal reflex, scattered Microaneurysms, no edema   Vessels attenuated, mild copper wiring, mild tortuosity attenuated, Tortuous   Periphery Attached, 360 MA/DBH, 360 peripheral laser changes greatest temporally, room for fill in if needed Attached, scattered MA greatest posteriorly           Refraction     Wearing Rx       Sphere Cylinder Axis Add   Right -2.25 +2.25 180 +2.50   Left -1.00 +1.50 175 +2.50    Type: prog           IMAGING AND PROCEDURES  Imaging and Procedures for @TODAY @  OCT, Retina - OU - Both Eyes       Right Eye Quality was good. Central Foveal Thickness: 273. Progression has improved. Findings include normal foveal contour, no SRF, intraretinal fluid (Mild persistent IRF / cystic changes temporal mac and fovea -- slightly improved centrally).   Left Eye Quality was good. Central Foveal Thickness: 237. Progression has been stable. Findings include normal foveal contour, no SRF, intraretinal hyper-reflective material, intraretinal fluid (Trace cystic changes superior fovea ).   Notes *Images captured and stored on drive  Diagnosis / Impression:  OD: Mild persistent IRF / cystic changes temporal mac and fovea -- slightly improved centrally OS: trace cystic changes superior fovea   Clinical management:  See below  Abbreviations: NFP - Normal foveal profile. CME - cystoid macular edema. PED - pigment epithelial detachment. IRF - intraretinal fluid. SRF - subretinal fluid. EZ - ellipsoid zone. ERM - epiretinal membrane. ORA - outer retinal atrophy. ORT - outer  retinal tubulation. SRHM - subretinal hyper-reflective material             ASSESSMENT/PLAN:    ICD-10-CM   1. Vitreous hemorrhage of right eye  H43.11 OCT, Retina - OU - Both Eyes    2. Moderate nonproliferative diabetic retinopathy of both eyes with macular edema associated with type 2 diabetes mellitus  E11.3313 OCT, Retina - OU - Both Eyes    3. Essential hypertension  I10     4. Hypertensive retinopathy of both eyes  H35.033     5. Pseudophakia of both eyes  Z96.1     6. Anatomical narrow angle  H40.039       Vitreous Hemorrhage OD - onset of floaters and vision loss beginning of Dec 2022 while putting up Christmas decorations - likely related to history of DM and HTN - pt also recalls multiple falls from vertigo around the same time of VH onset - s/p IVA OD #1 (12.08.22) - no significant improvement - pre op BCVA OD -- CF 1'  - s/p PPV/EL 12.22.22 OD - intra-op -- visualized peripheral ischemia and scattered DBH; no RT/RD - doing great - BCVA 20/25 OD from CF -- stable             - IOP 11 - off all drops  - monitor   2. Moderate nonproliferative diabetic retinopathy w/ DME, OU  - FA 07.03.23 shows: OD Focal leakage temporal macula, scattered leaking MA, staining of peripheral laser, OS Mild peripheral nonperfusion nasal periphery, scattered MA with late leakage, no NV OU - exam with scattered MA OU -- OD w/ h/o VH as above  - BCVA 20/25 OU - stable  - OCT shows OD: Mild persistent IRF / cystic changes  temporal mac and fovea -- slightly improved centrally; OS: trace cystic changes superior fovea   - discussed tight BP control and BS control   - discussed findings, prognosis  - recommend continued monitoring   - f/u 6 months DFE, OCT, FA (transit OD)  3,4. Hypertensive retinopathy OU  - discussed importance of tight BP control  - monitor  5. Pseudophakia OU  - s/p CE/IOL OU  - beautiful surgeries by Dr. Talbert Forest, doing well  - monitor  6. Anatomical  Narrow Angles OU  - s/p LPI OU by Dr. Talbert Forest  - OS patent LPI and open angle  - OD w/ closed PI  - saw Dr. Nancy Fetter on 09.21.20---no intervention recommended  Ophthalmic Meds Ordered this visit:  No orders of the defined types were placed in this encounter.    Return for f/u 6 months, NPDR, DFE, OCT, FA.  There are no Patient Instructions on file for this visit.   Explained the diagnoses, plan, and follow up with the patient and they expressed understanding.  Patient expressed understanding of the importance of proper follow up care.   This document serves as a record of services personally performed by Gardiner Sleeper, MD, PhD. It was created on their behalf by Roselee Nova, COMT. The creation of this record is the provider's dictation and/or activities during the visit.  Electronically signed by: Roselee Nova, COMT 05/29/22 12:16 PM  Gardiner Sleeper, M.D., Ph.D. Diseases & Surgery of the Retina and Vitreous [facility]  I have reviewed the above documentation for accuracy and completeness, and I agree with the above. Gardiner Sleeper, M.D., Ph.D. 05/29/22 12:17 PM   Abbreviations: M myopia (nearsighted); A astigmatism; H hyperopia (farsighted); P presbyopia; Mrx spectacle prescription;  CTL contact lenses; OD right eye; OS left eye; OU both eyes  XT exotropia; ET esotropia; PEK punctate epithelial keratitis; PEE punctate epithelial erosions; DES dry eye syndrome; MGD meibomian gland dysfunction; ATs artificial tears; PFAT's preservative free artificial tears; [place] nuclear sclerotic cataract; PSC posterior subcapsular cataract; ERM epi-retinal membrane; PVD posterior vitreous detachment; RD retinal detachment; DM diabetes mellitus; DR diabetic retinopathy; NPDR non-proliferative diabetic retinopathy; PDR proliferative diabetic retinopathy; CSME clinically significant macular edema; DME diabetic macular edema; dbh dot blot hemorrhages; CWS cotton wool spot; POAG primary  open angle glaucoma; C/D cup-to-disc ratio; HVF humphrey visual field; GVF goldmann visual field; OCT optical coherence tomography; IOP intraocular pressure; BRVO Branch retinal vein occlusion; CRVO central retinal vein occlusion; CRAO central retinal artery occlusion; BRAO branch retinal artery occlusion; RT retinal tear; SB scleral buckle; PPV pars plana vitrectomy; VH Vitreous hemorrhage; PRP panretinal laser photocoagulation; IVK intravitreal kenalog; VMT vitreomacular traction; MH Macular hole;  NVD neovascularization of the disc; NVE neovascularization elsewhere; AREDS age related eye disease study; ARMD age related macular degeneration; POAG primary open angle glaucoma; EBMD epithelial/anterior basement membrane dystrophy; ACIOL anterior chamber intraocular lens; IOL intraocular lens; PCIOL posterior chamber intraocular lens; Phaco/IOL phacoemulsification with intraocular lens placement; [place] photorefractive keratectomy; LASIK laser assisted in situ keratomileusis; HTN hypertension; DM diabetes mellitus; COPD chronic obstructive pulmonary disease

## 2022-05-29 ENCOUNTER — Encounter (INDEPENDENT_AMBULATORY_CARE_PROVIDER_SITE_OTHER): Payer: Self-pay | Admitting: Ophthalmology

## 2022-05-29 ENCOUNTER — Ambulatory Visit (INDEPENDENT_AMBULATORY_CARE_PROVIDER_SITE_OTHER): Payer: Medicare HMO | Admitting: Ophthalmology

## 2022-05-29 DIAGNOSIS — E113313 Type 2 diabetes mellitus with moderate nonproliferative diabetic retinopathy with macular edema, bilateral: Secondary | ICD-10-CM | POA: Diagnosis not present

## 2022-05-29 DIAGNOSIS — H35033 Hypertensive retinopathy, bilateral: Secondary | ICD-10-CM

## 2022-05-29 DIAGNOSIS — H40039 Anatomical narrow angle, unspecified eye: Secondary | ICD-10-CM | POA: Diagnosis not present

## 2022-05-29 DIAGNOSIS — Z961 Presence of intraocular lens: Secondary | ICD-10-CM

## 2022-05-29 DIAGNOSIS — I10 Essential (primary) hypertension: Secondary | ICD-10-CM | POA: Diagnosis not present

## 2022-05-29 DIAGNOSIS — H4311 Vitreous hemorrhage, right eye: Secondary | ICD-10-CM

## 2022-06-12 DIAGNOSIS — I10 Essential (primary) hypertension: Secondary | ICD-10-CM | POA: Diagnosis not present

## 2022-06-12 DIAGNOSIS — R1084 Generalized abdominal pain: Secondary | ICD-10-CM | POA: Diagnosis not present

## 2022-06-12 DIAGNOSIS — R634 Abnormal weight loss: Secondary | ICD-10-CM | POA: Diagnosis not present

## 2022-06-12 DIAGNOSIS — E1169 Type 2 diabetes mellitus with other specified complication: Secondary | ICD-10-CM | POA: Diagnosis not present

## 2022-07-06 ENCOUNTER — Ambulatory Visit: Payer: Medicare HMO | Admitting: Endocrinology

## 2022-07-28 DIAGNOSIS — E1165 Type 2 diabetes mellitus with hyperglycemia: Secondary | ICD-10-CM | POA: Diagnosis not present

## 2022-07-28 DIAGNOSIS — E785 Hyperlipidemia, unspecified: Secondary | ICD-10-CM | POA: Diagnosis not present

## 2022-07-28 DIAGNOSIS — K648 Other hemorrhoids: Secondary | ICD-10-CM | POA: Diagnosis not present

## 2022-07-28 DIAGNOSIS — I10 Essential (primary) hypertension: Secondary | ICD-10-CM | POA: Diagnosis not present

## 2022-08-22 DIAGNOSIS — Z1211 Encounter for screening for malignant neoplasm of colon: Secondary | ICD-10-CM | POA: Diagnosis not present

## 2022-08-22 DIAGNOSIS — K625 Hemorrhage of anus and rectum: Secondary | ICD-10-CM | POA: Diagnosis not present

## 2022-08-22 DIAGNOSIS — K9089 Other intestinal malabsorption: Secondary | ICD-10-CM | POA: Diagnosis not present

## 2022-08-22 DIAGNOSIS — R634 Abnormal weight loss: Secondary | ICD-10-CM | POA: Diagnosis not present

## 2022-08-28 DIAGNOSIS — Z1211 Encounter for screening for malignant neoplasm of colon: Secondary | ICD-10-CM | POA: Diagnosis not present

## 2022-08-28 DIAGNOSIS — Z86010 Personal history of colonic polyps: Secondary | ICD-10-CM | POA: Diagnosis not present

## 2022-08-28 DIAGNOSIS — K635 Polyp of colon: Secondary | ICD-10-CM | POA: Diagnosis not present

## 2022-08-28 DIAGNOSIS — R634 Abnormal weight loss: Secondary | ICD-10-CM | POA: Diagnosis not present

## 2022-08-28 DIAGNOSIS — K625 Hemorrhage of anus and rectum: Secondary | ICD-10-CM | POA: Diagnosis not present

## 2022-08-28 DIAGNOSIS — D123 Benign neoplasm of transverse colon: Secondary | ICD-10-CM | POA: Diagnosis not present

## 2022-08-28 DIAGNOSIS — D122 Benign neoplasm of ascending colon: Secondary | ICD-10-CM | POA: Diagnosis not present

## 2022-08-28 DIAGNOSIS — K5730 Diverticulosis of large intestine without perforation or abscess without bleeding: Secondary | ICD-10-CM | POA: Diagnosis not present

## 2022-08-28 LAB — HM COLONOSCOPY

## 2022-09-01 IMAGING — CT CT L SPINE W/O CM
3 series · 10 of 33 positions shown, 12 images · IV contrast (Omnipaque or Isovue)
Comparison: None.

CLINICAL DATA: Right flank and back pain after fall

EXAM:
CT LUMBAR SPINE WITHOUT CONTRAST
TECHNIQUE: Multidetector CT imaging of the lumbar spine was performed without
intravenous contrast administration. Multiplanar CT image
reconstructions were also generated.

[Series 9: axial st spine · axial · 0.29mm/px · z∈[+989,+1123]mm · 2 of 145 slices shown, 3 images]
[im 45/145  soft-tissue]
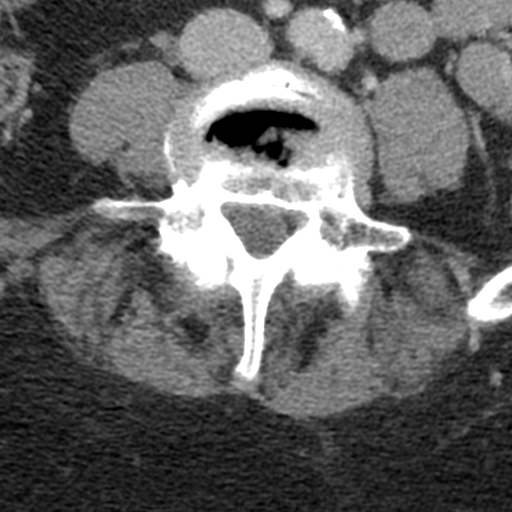
[im 45/145  bone]
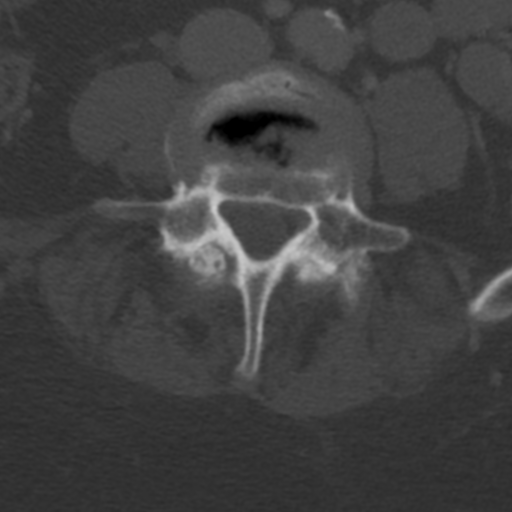
[im 111/145  bone]
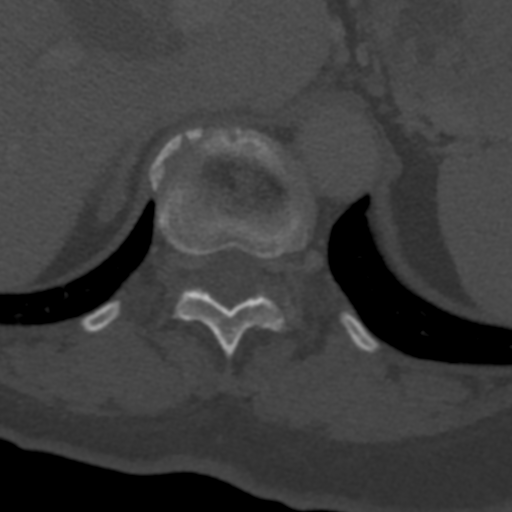

[Series 11: coronal spine · coronal · 0.24mm/px · 3 of 59 slices shown]
[im 12/59  bone]
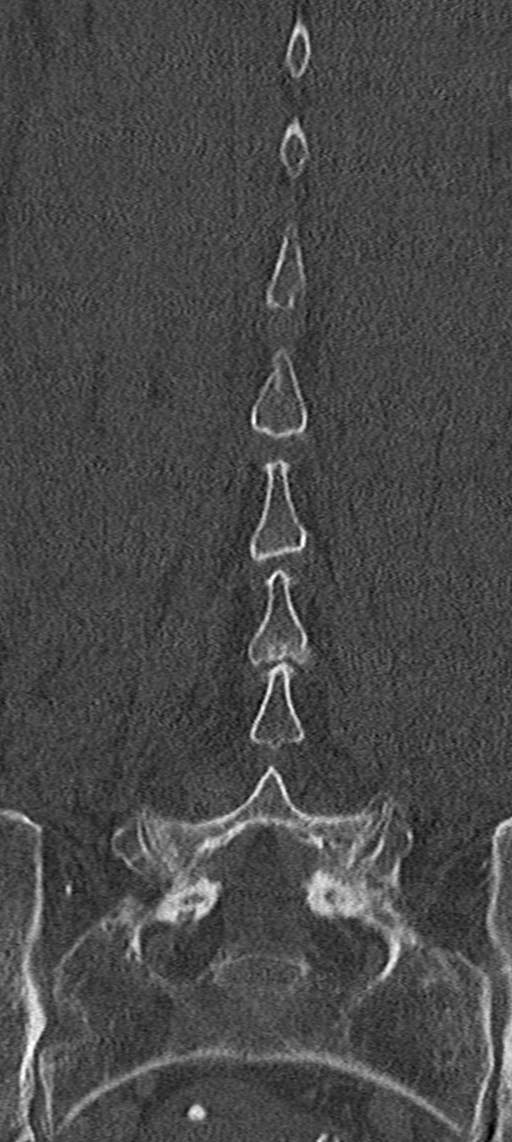
[im 24/59  bone]
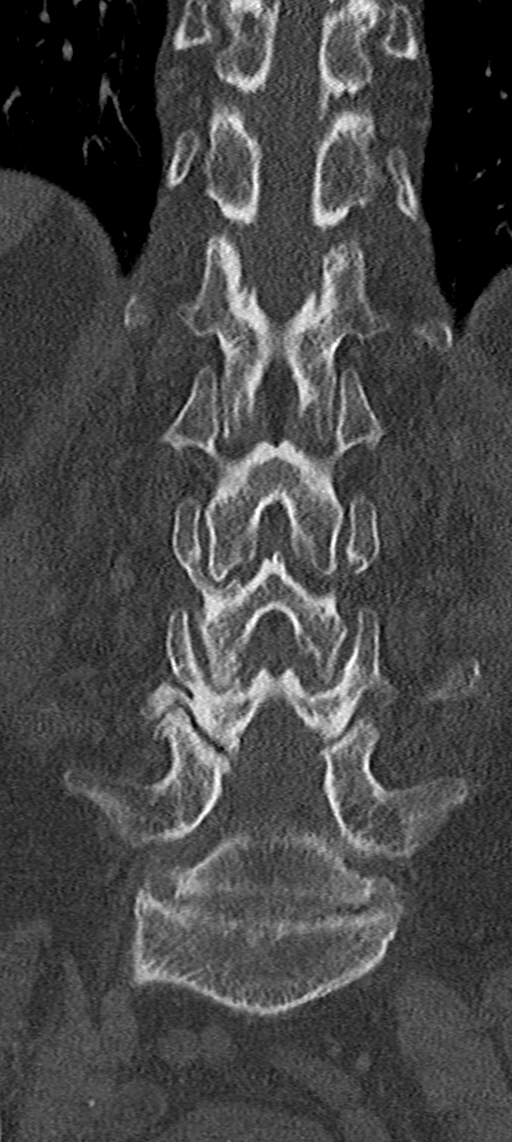
[im 35/59  bone]
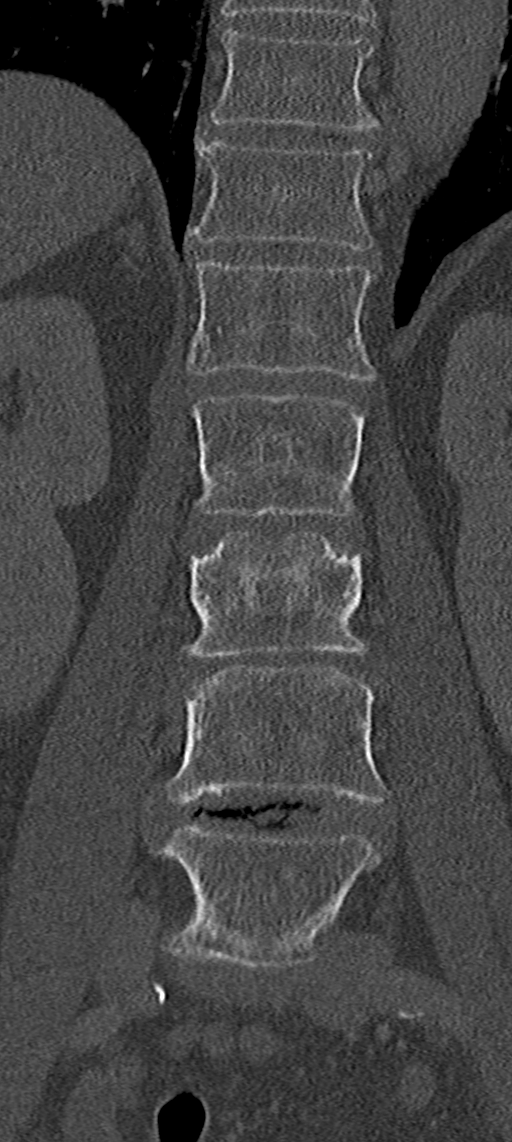

[Series 12: sagittal spine · sagittal · 0.30mm/px · 5 of 33 slices shown, 6 images]
[im 11/33  bone]
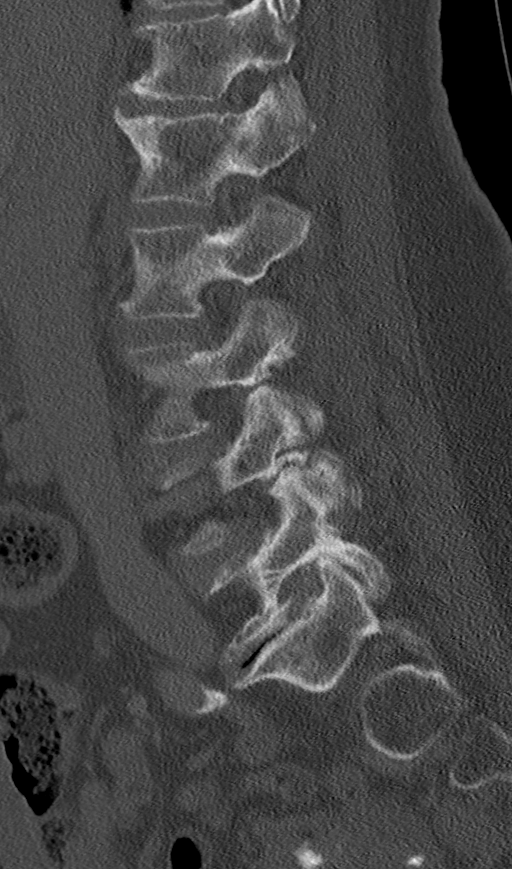
[im 14/33  bone]
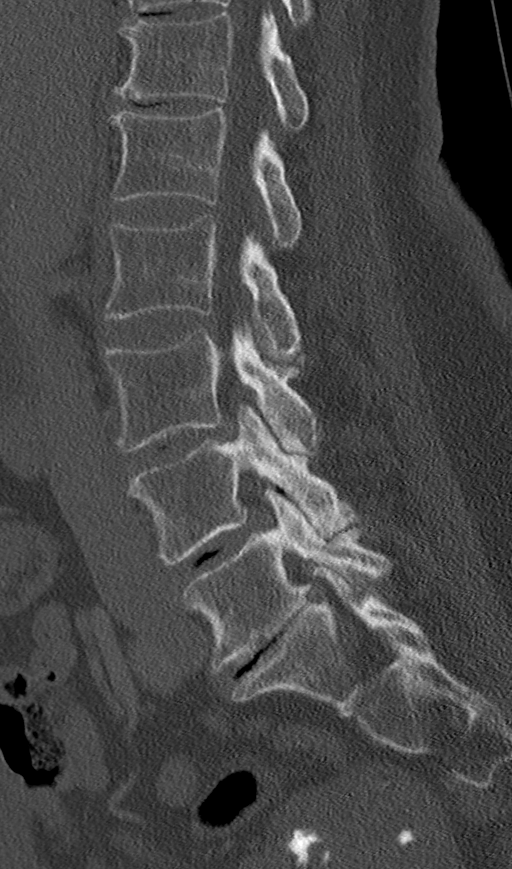
[im 17/33  soft-tissue]
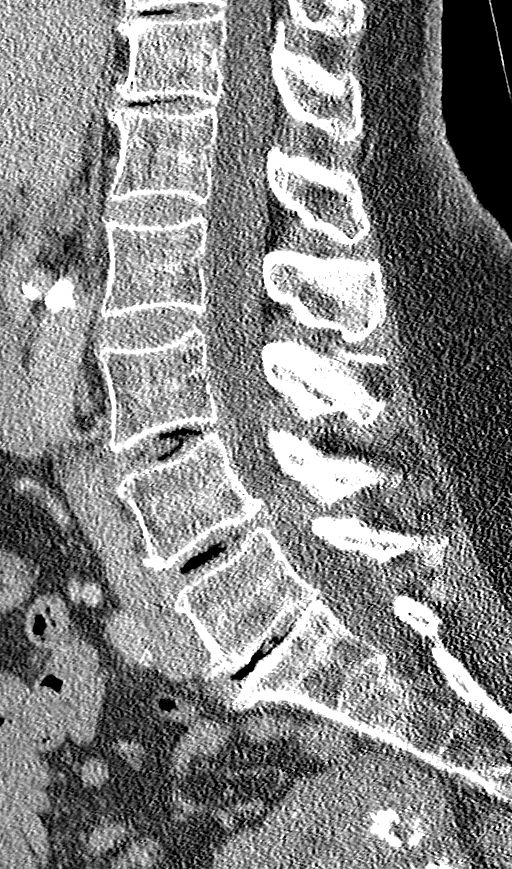
[im 17/33  bone]
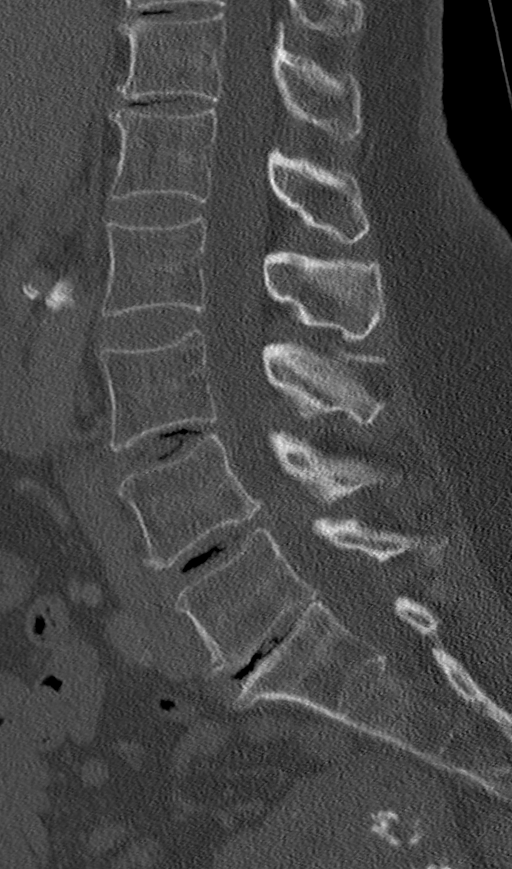
[im 19/33  bone]
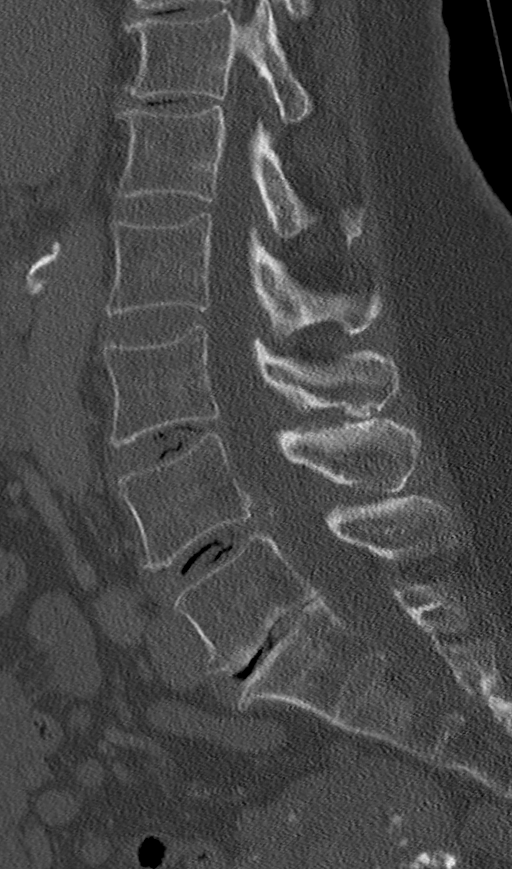
[im 22/33  bone]
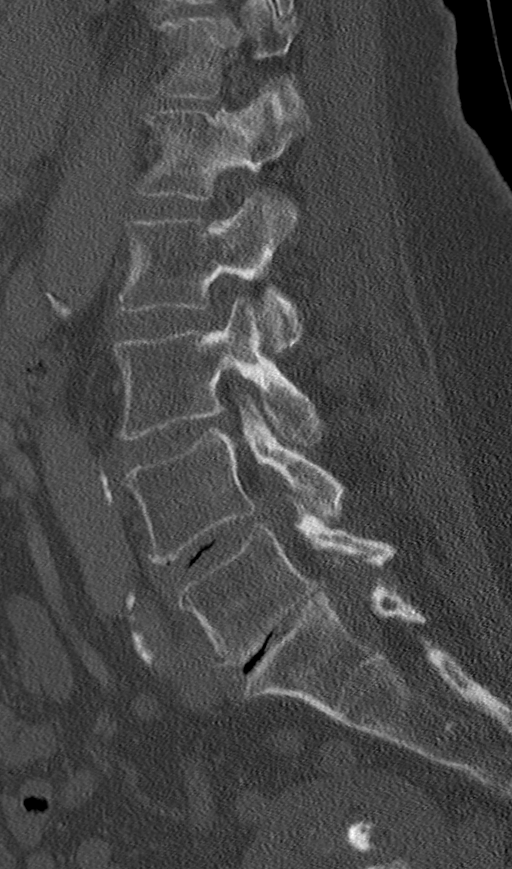

[10 of 33 positions shown; findings below may reference images not displayed]

FINDINGS: Segmentation: 5 lumbar type vertebrae.

Alignment: Normal.

Vertebrae: No acute fracture or focal pathologic process.

Paraspinal and other soft tissues: Calcific aortic atherosclerosis.
Uterine fibroids.

Disc levels: There is mild spinal canal stenosis at L4-5.

There is severe bilateral L5-S1 neural foraminal stenosis.
IMPRESSION: 1. No acute fracture or static subluxation of the lumbar spine.
2. Severe bilateral L5-S1 neural foraminal stenosis.

Aortic Atherosclerosis ([id number]).

## 2022-09-01 IMAGING — CT CT ABD-PELV W/ CM
2 of 5 series · 17 of 46 positions shown, 19 images · IV contrast (Omnipaque or Isovue)
Comparison: 04/09/2012

CLINICAL DATA: Acute abdominal pain

EXAM:
CT ABDOMEN AND PELVIS WITH CONTRAST
TECHNIQUE: Multidetector CT imaging of the abdomen and pelvis was performed
using the standard protocol following bolus administration of
intravenous contrast.
CONTRAST:  100mL OMNIPAQUE IOHEXOL 300 MG/ML  SOLN

[Series 2: axial st · axial · 0.71mm/px · z∈[+808,+1198]mm · 14 of 90 slices shown, 16 images]
[im 6/90  soft-tissue]
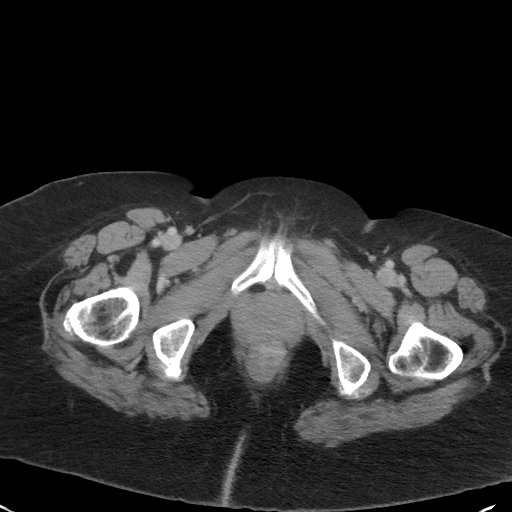
[im 6/90  bone]
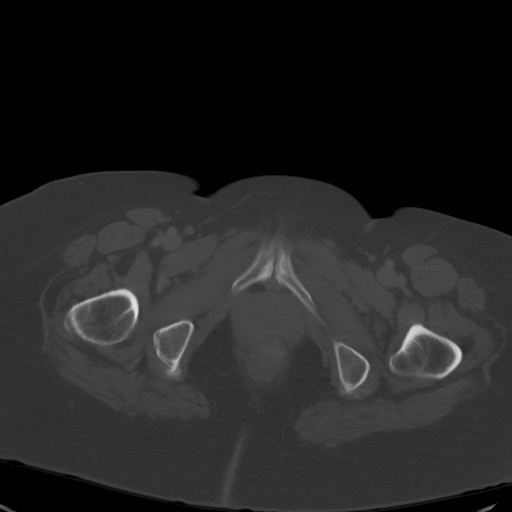
[im 12/90  soft-tissue]
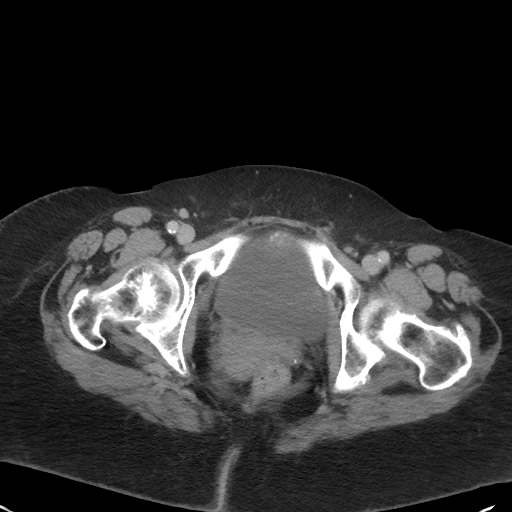
[im 17/90  soft-tissue]
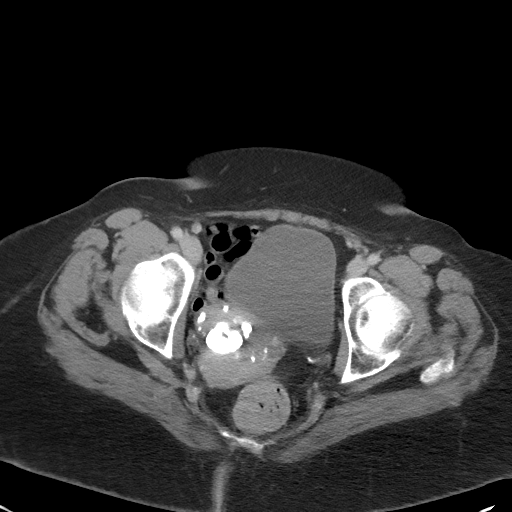
[im 23/90  soft-tissue]
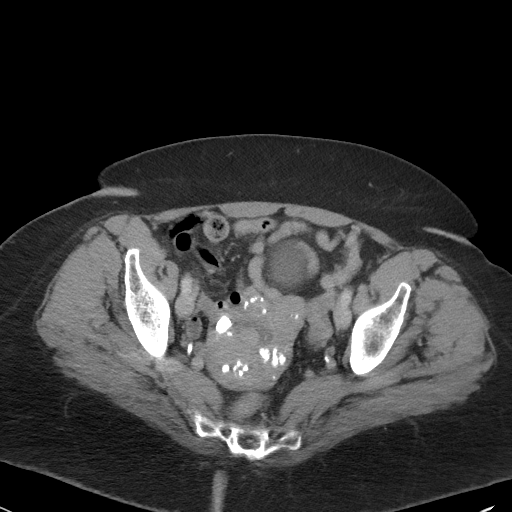
[im 28/90  soft-tissue]
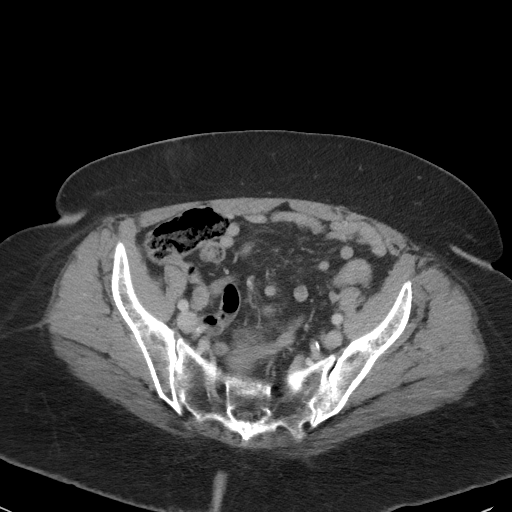
[im 34/90  soft-tissue]
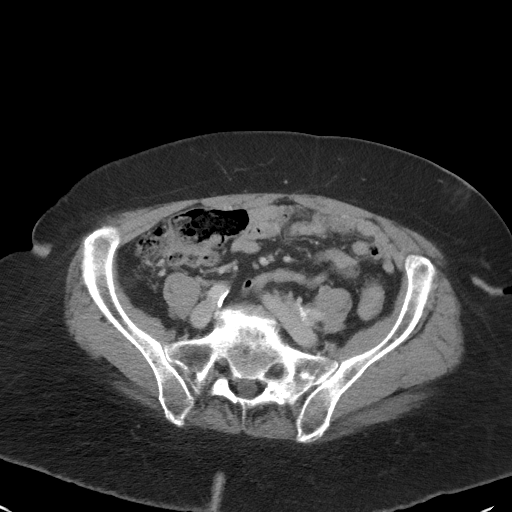
[im 39/90  soft-tissue]
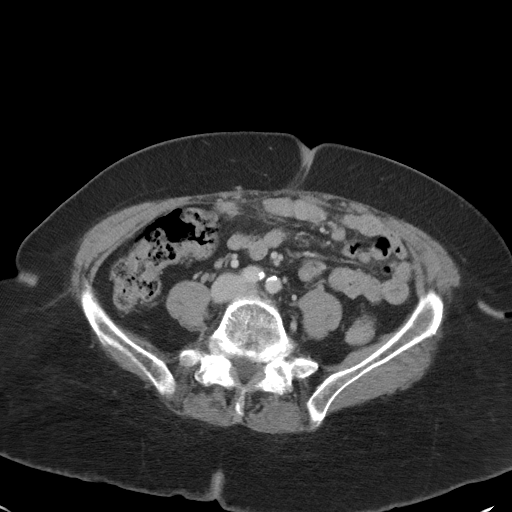
[im 51/90  soft-tissue]
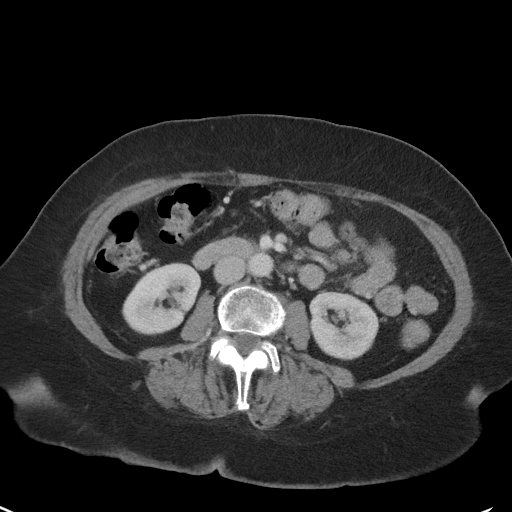
[im 56/90  soft-tissue]
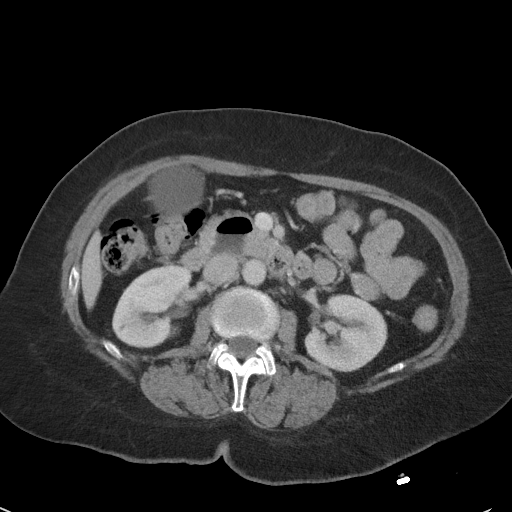
[im 56/90  bone]
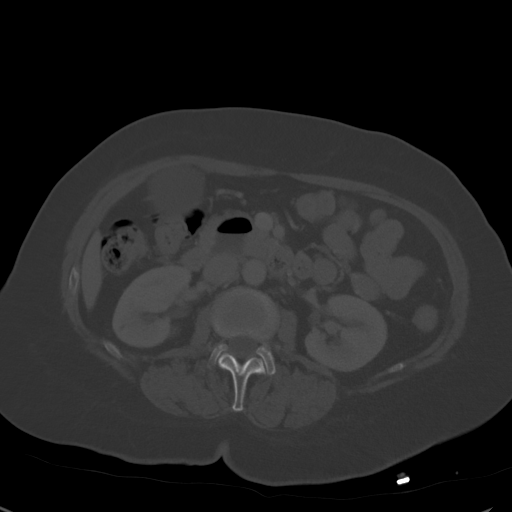
[im 62/90  soft-tissue]
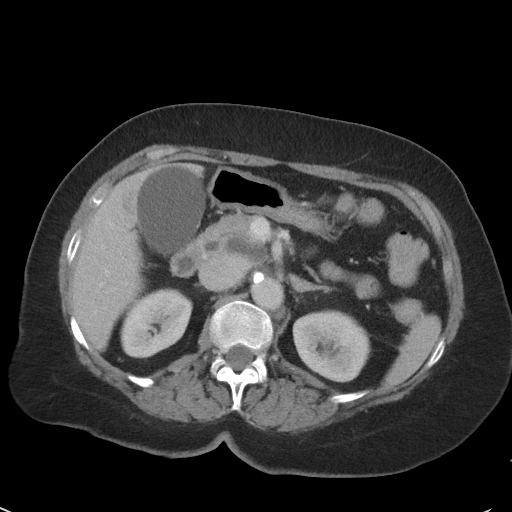
[im 67/90  soft-tissue]
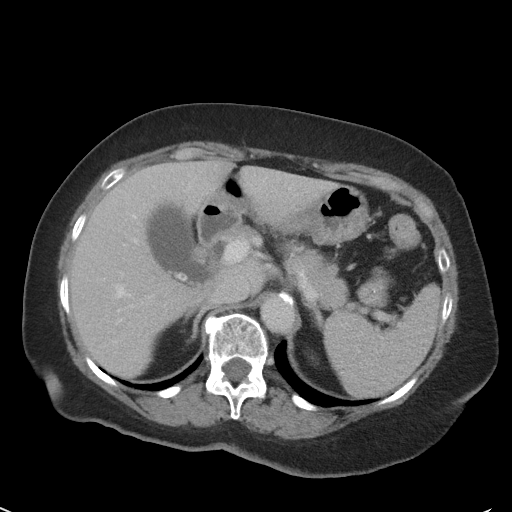
[im 73/90  soft-tissue]
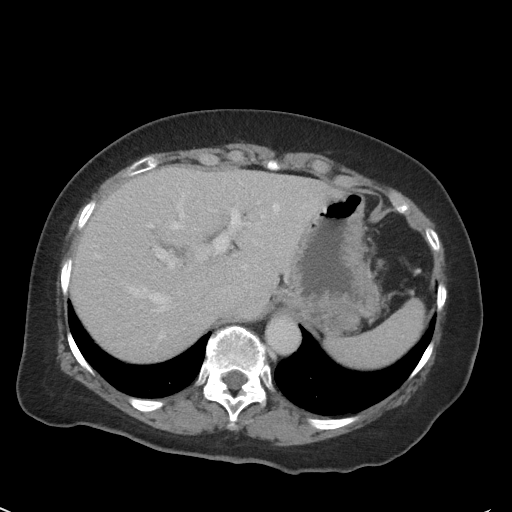
[im 78/90  soft-tissue]
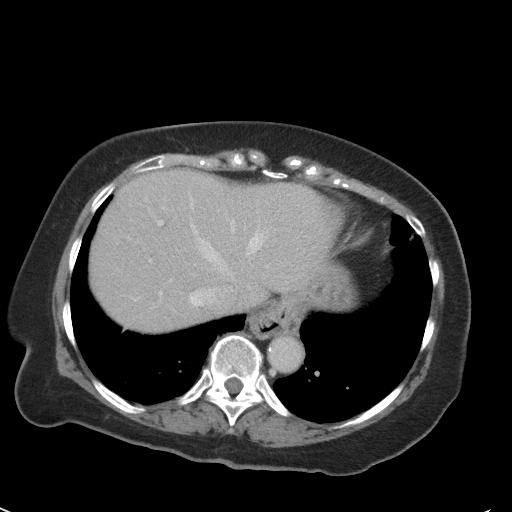
[im 84/90  soft-tissue]
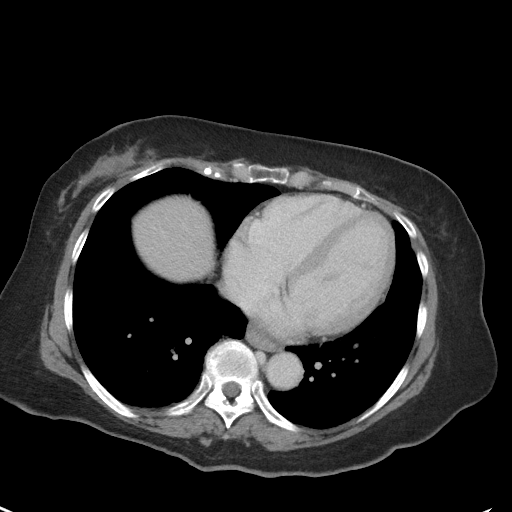

[Series 4: coronal st · coronal · 0.78mm/px · 3 of 79 slices shown]
[im 27/79  soft-tissue]
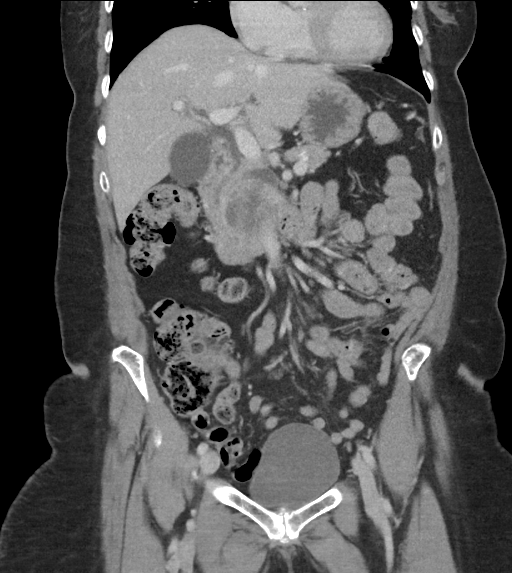
[im 35/79  soft-tissue]
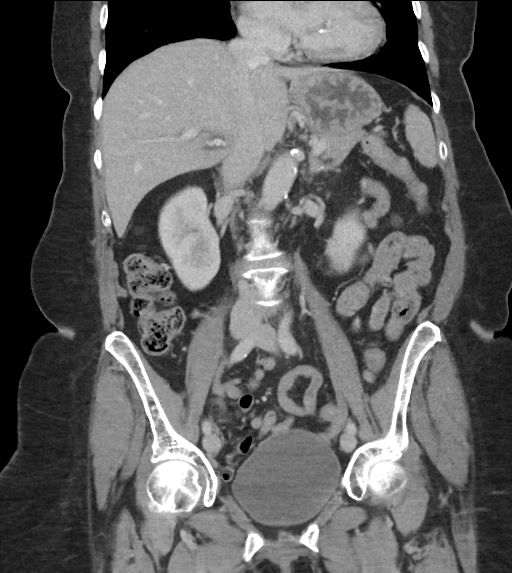
[im 44/79  soft-tissue]
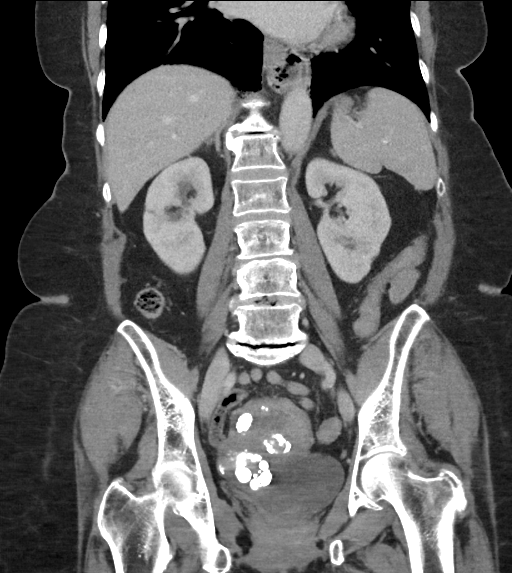

[17 of 46 positions shown; findings below may reference images not displayed]

FINDINGS: LOWER CHEST: Normal.

HEPATOBILIARY: Distended gallbladder with cholelithiasis. Mild
intrahepatic biliary dilatation . no focal liver abnormality. Common
bile duct is normal caliber.

PANCREAS: Normal pancreas. No ductal dilatation or peripancreatic
fluid collection.

SPLEEN: Normal.

ADRENALS/URINARY TRACT: The adrenal glands are normal. No
hydronephrosis, nephroureterolithiasis or solid renal mass. The
urinary bladder is normal for degree of distention

STOMACH/BOWEL: No hiatal hernia. There is a diverticulum of the
third segment of the duodenum. No small bowel dilatation or
inflammation. No focal colonic abnormality. Normal appendix.

VASCULAR/LYMPHATIC: There is calcific atherosclerosis of the
abdominal aorta. No lymphadenopathy.

REPRODUCTIVE: Multiple calcified uterine fibroids.

MUSCULOSKELETAL. Multilevel degenerative disc disease and facet
arthrosis. No bony spinal canal stenosis.

OTHER: None.
IMPRESSION: 1. Distended gallbladder with cholelithiasis and mild intrahepatic
biliary dilatation. If there is concern for acute cholecystitis,
consider right upper quadrant ultrasound.
2. Fibroid uterus.

Aortic atherosclerosis ([id number].I).

## 2022-09-28 DIAGNOSIS — K9089 Other intestinal malabsorption: Secondary | ICD-10-CM | POA: Diagnosis not present

## 2022-09-28 DIAGNOSIS — K219 Gastro-esophageal reflux disease without esophagitis: Secondary | ICD-10-CM | POA: Diagnosis not present

## 2022-09-28 DIAGNOSIS — R634 Abnormal weight loss: Secondary | ICD-10-CM | POA: Diagnosis not present

## 2022-09-28 DIAGNOSIS — Z86010 Personal history of colonic polyps: Secondary | ICD-10-CM | POA: Diagnosis not present

## 2022-10-16 DIAGNOSIS — M5442 Lumbago with sciatica, left side: Secondary | ICD-10-CM | POA: Diagnosis not present

## 2022-10-16 DIAGNOSIS — E1165 Type 2 diabetes mellitus with hyperglycemia: Secondary | ICD-10-CM | POA: Diagnosis not present

## 2022-10-16 DIAGNOSIS — K5909 Other constipation: Secondary | ICD-10-CM | POA: Diagnosis not present

## 2022-10-16 DIAGNOSIS — F411 Generalized anxiety disorder: Secondary | ICD-10-CM | POA: Diagnosis not present

## 2022-10-23 DIAGNOSIS — R5383 Other fatigue: Secondary | ICD-10-CM | POA: Diagnosis not present

## 2022-10-23 DIAGNOSIS — R634 Abnormal weight loss: Secondary | ICD-10-CM | POA: Diagnosis not present

## 2022-10-23 DIAGNOSIS — I10 Essential (primary) hypertension: Secondary | ICD-10-CM | POA: Diagnosis not present

## 2022-10-23 DIAGNOSIS — Z0001 Encounter for general adult medical examination with abnormal findings: Secondary | ICD-10-CM | POA: Diagnosis not present

## 2022-10-23 DIAGNOSIS — E1165 Type 2 diabetes mellitus with hyperglycemia: Secondary | ICD-10-CM | POA: Diagnosis not present

## 2022-10-23 DIAGNOSIS — K581 Irritable bowel syndrome with constipation: Secondary | ICD-10-CM | POA: Diagnosis not present

## 2022-11-06 DIAGNOSIS — M130 Polyarthritis, unspecified: Secondary | ICD-10-CM | POA: Diagnosis not present

## 2022-11-06 DIAGNOSIS — E1165 Type 2 diabetes mellitus with hyperglycemia: Secondary | ICD-10-CM | POA: Diagnosis not present

## 2022-11-06 DIAGNOSIS — I10 Essential (primary) hypertension: Secondary | ICD-10-CM | POA: Diagnosis not present

## 2022-11-06 DIAGNOSIS — R634 Abnormal weight loss: Secondary | ICD-10-CM | POA: Diagnosis not present

## 2022-11-15 DIAGNOSIS — H524 Presbyopia: Secondary | ICD-10-CM | POA: Diagnosis not present

## 2022-11-15 DIAGNOSIS — E11319 Type 2 diabetes mellitus with unspecified diabetic retinopathy without macular edema: Secondary | ICD-10-CM | POA: Diagnosis not present

## 2022-11-15 DIAGNOSIS — Z961 Presence of intraocular lens: Secondary | ICD-10-CM | POA: Diagnosis not present

## 2022-11-20 DIAGNOSIS — E1165 Type 2 diabetes mellitus with hyperglycemia: Secondary | ICD-10-CM | POA: Diagnosis not present

## 2022-11-20 DIAGNOSIS — I10 Essential (primary) hypertension: Secondary | ICD-10-CM | POA: Diagnosis not present

## 2022-11-20 DIAGNOSIS — Z23 Encounter for immunization: Secondary | ICD-10-CM | POA: Diagnosis not present

## 2022-11-20 DIAGNOSIS — R760 Raised antibody titer: Secondary | ICD-10-CM | POA: Diagnosis not present

## 2022-11-22 NOTE — Progress Notes (Signed)
[facility] - Clinic Note  11/27/2022    CHIEF COMPLAINT Patient presents for Retina Follow Up   HISTORY OF PRESENT ILLNESS: Sandra Washington is a 82 y.o. female who presents to the clinic today for:   HPI     Retina Follow Up   Patient presents with  Other.  In right eye.  This started years ago.  Severity is moderate.  Duration of 6 months.  Since onset it is stable.  I, the attending physician,  performed the HPI with the patient and updated documentation appropriately.        Comments   Patient feels the vision is the same. She is not using eye drops.  He blood sugar was 97.      Last edited by Rennis Chris, MD on 11/30/2022  3:42 PM.    Pt states she has been taken off some of her insulin and her blood sugar is still under good control, she got a new glasses Rx from her regular eye dr, but she hasn't gotten it filled yet  Referring physician: Renaye Rakers, MD [address] [place],  [place] [address]  HISTORICAL INFORMATION:   Selected notes from the MEDICAL RECORD NUMBER Referred by Dr. Daisy Lazar for concern of diabetic retinopathy   CURRENT MEDICATIONS: No current outpatient medications on file. (Ophthalmic Drugs)   No current facility-administered medications for this visit. (Ophthalmic Drugs)   Current Outpatient Medications (Other)  Medication Sig   ACCU-CHEK SOFTCLIX LANCETS lancets Use bid   acetaminophen (TYLENOL) 500 MG tablet Take 500-1,000 mg by mouth every 6 (six) hours as needed (for back aches).   amLODipine (NORVASC) 5 MG tablet Take 5 mg by mouth 2 (two) times daily.   atorvastatin (LIPITOR) 20 MG tablet Take 1 tablet (20 mg total) by mouth daily. (Patient taking differently: Take 20 mg by mouth at bedtime.)   Blood Glucose Monitoring Suppl (ACCU-CHEK GUIDE ME) w/Device KIT 1 each by Does not apply route 2 (two) times daily. Use accu chek guide me device to check blood sugar twice daily.DX:E11.65   Cholecalciferol  (VITAMIN D3) 5000 UNITS CAPS Take 5,000 Units by mouth daily.   glucose blood (ACCU-CHEK AVIVA PLUS) test strip Use to check blood sugar 2 times daily   insulin NPH Human (NOVOLIN N RELION) 100 UNIT/ML injection 8 U in am and 12 U hs   insulin regular (NOVOLIN R RELION) 100 units/mL injection INJECT 8 UNITS SUBCUTANEOUSLY WITH BREAKFAST AND LUNCH, AND 16 UNITS WITH DINNER   niacin (SLO-NIACIN) 500 MG tablet Take 500 mg by mouth at bedtime.   omeprazole (PRILOSEC) 40 MG capsule Take 40 mg by mouth daily.   ondansetron (ZOFRAN) 4 MG tablet Take 1 tablet (4 mg total) by mouth every 6 (six) hours as needed for nausea.   valsartan-hydrochlorothiazide (DIOVAN-HCT) 160-12.5 MG tablet Take 1 tablet by mouth in the morning.   No current facility-administered medications for this visit. (Other)   REVIEW OF SYSTEMS: ROS   Positive for: Gastrointestinal, Neurological, Endocrine, Eyes Negative for: Constitutional, Skin, Genitourinary, Musculoskeletal, HENT, Cardiovascular, Respiratory, Allergic/Imm, Heme/Lymph Last edited by Charlette Caffey, COT on 11/27/2022  8:56 AM.       ALLERGIES No Known Allergies  PAST MEDICAL HISTORY Past Medical History:  Diagnosis Date   Complication of anesthesia    Diabetes mellitus without complication (HCC)    Diabetic retinopathy (HCC)    GERD (gastroesophageal reflux disease)    History of kidney stones  Hypertension    Hypertensive retinopathy    Stroke [facility])    age 54- no residual   Past Surgical History:  Procedure Laterality Date   BREAST BIOPSY Left    CATARACT EXTRACTION Bilateral 2018   Dr. Darel Hong   CHOLECYSTECTOMY N/A 07/01/2020   Procedure: LAPAROSCOPIC CHOLECYSTECTOMY;  Surgeon: Lucretia Roers, MD;  Location: [facility];  Service: General;  Laterality: N/A;   EYE SURGERY     HAND SURGERY     IR RADIOLOGIST EVAL & MGMT  08/05/2020   NO PAST SURGERIES     PARS PLANA VITRECTOMY Right 02/17/2021   Procedure: PARS PLANA VITRECTOMY WITH 25  GAUGE;  Surgeon: Rennis Chris, MD;  Location: [facility];  Service: Ophthalmology;  Laterality: Right;   PHOTOCOAGULATION WITH LASER Right 02/17/2021   Procedure: PHOTOCOAGULATION WITH LASER;  Surgeon: Rennis Chris, MD;  Location: [facility] OR;  Service: Ophthalmology;  Laterality: Right;   FAMILY HISTORY History reviewed. No pertinent family history.  SOCIAL HISTORY Social History   Tobacco Use   Smoking status: Former   Smokeless tobacco: Never   Tobacco comments:    Smoked in [employer]  Vaping Use   Vaping status: Former  Substance Use Topics   Alcohol use: No   Drug use: No       OPHTHALMIC EXAM:  Base Eye Exam     Visual Acuity (Snellen - Linear)       Right Left   Dist cc 20/20 +1 20/25   Dist ph cc  NI    Correction: Glasses         Tonometry (Tonopen, 9:01 AM)       Right Left   Pressure 12 10         Pupils       Dark Light Shape React APD   Right 3 2 Round Slow None   Left 3 2 Round Slow None         Visual Fields       Left Right    Full Full         Extraocular Movement       Right Left    Full, Ortho Full, Ortho         Neuro/Psych     Oriented x3: Yes   Mood/Affect: Normal         Dilation     Both eyes: 1.0% Mydriacyl, 2.5% Phenylephrine @ 8:59 AM           Slit Lamp and Fundus Exam     Slit Lamp Exam       Right Left   Lids/Lashes Dermatochalasis - upper lid Dermatochalasis - upper lid, Meibomian gland dysfunction   Conjunctiva/Sclera mild melanosis, nasal and temporal pinguecula nasal and temporal Pinguecula, Melanosis   Cornea Arcus, Well healed temporal cataract wounds, trace PEE Arcus, Well healed temporal cataract wounds, trace PEE   Anterior Chamber deep, clear, narrow temporal angle, No cell or flare deep and clear   Iris Round and dilated, focal iris atrophy at 1100  --  PI not open, No NVI Round and dilated, small patent PI at 0200, mild bowing, No NVI   Lens PC IOL in good position with open PC PC IOL  in good position with open PC   Anterior Vitreous post vitrectomy, clear Vitreous syneresis         Fundus Exam       Right Left   Disc mild Pallor, Sharp rim, mild PPA/PPP Pink and  Sharp, Compact, mild PPP   C/D Ratio 0.2 0.2   Macula Flat, Blunted foveal reflex, mild RPE mottling, focal drusen SN to fovea, no edema, scattered MA/DBH and cystic changes greatest temporal mac, good focal laser targets ST mac Flat, Blunted foveal reflex, scattered Microaneurysms, no edema   Vessels attenuated, mild tortuosity attenuated, mild tortuosity   Periphery Attached, 360 MA/DBH, 360 peripheral laser changes greatest temporally, room for fill in if needed Attached, scattered MA greatest posteriorly, focal DBH temporal periphery           Refraction     Wearing Rx       Sphere Cylinder Axis Add   Right -2.25 +2.25 180 +2.50   Left -1.00 +1.50 175 +2.50    Type: prog           IMAGING AND PROCEDURES  Imaging and Procedures for @TODAY @  OCT, Retina - OU - Both Eyes       Right Eye Quality was good. Central Foveal Thickness: 281. Progression has worsened. Findings include normal foveal contour, no SRF, intraretinal fluid (Mild persistent IRF / cystic changes temporal mac and fovea -- slightly increased).   Left Eye Quality was good. Central Foveal Thickness: 242. Progression has been stable. Findings include normal foveal contour, no SRF, intraretinal hyper-reflective material, intraretinal fluid (Trace cystic changes superior fovea -- stable).   Notes *Images captured and stored on drive  Diagnosis / Impression:  OD: Mild persistent IRF / cystic changes temporal mac and fovea -- slightly increased OS: trace cystic changes superior fovea -- stable  Clinical management:  See below  Abbreviations: NFP - Normal foveal profile. CME - cystoid macular edema. PED - pigment epithelial detachment. IRF - intraretinal fluid. SRF - subretinal fluid. EZ - ellipsoid zone. ERM - epiretinal  membrane. ORA - outer retinal atrophy. ORT - outer retinal tubulation. SRHM - subretinal hyper-reflective material      Fluorescein Angiography Optos (Transit OD)       Right Eye Progression has been stable. Early phase findings include staining, microaneurysm, vascular perfusion defect. Mid/Late phase findings include leakage, staining, microaneurysm, vascular perfusion defect (Focal leakage temporal macula, scattered leaking MA, staining of peripheral laser, no NV).   Left Eye Progression has been stable. Early phase findings include microaneurysm, vascular perfusion defect. Mid/Late phase findings include leakage, microaneurysm, vascular perfusion defect (Mild peripheral nonperfusion nasal periphery, scattered MA with late leakage greatest perifovea).   Notes **Images stored on drive**  Impression: Moderate NPDR OU OD: Focal leakage temporal macula, scattered leaking MA, staining of peripheral laser, no NV OS: Mild peripheral nonperfusion nasal periphery, scattered MA with late leakage greatest perifovea - no NV OU - patches of peripheral nonperfusion OU           ASSESSMENT/PLAN:    ICD-10-CM   1. Moderate nonproliferative diabetic retinopathy of both eyes with macular edema associated with type 2 diabetes mellitus (HCC)  E11.3313 OCT, Retina - OU - Both Eyes    2. Encounter for long-term (current) use of insulin (HCC)  Z79.4     3. Long-term current use of injectable noninsulin antidiabetic medication  Z79.85     4. Vitreous hemorrhage of right eye (HCC)  H43.11     5. Essential hypertension  I10     6. Hypertensive retinopathy of both eyes  H35.033 Fluorescein Angiography Optos (Transit OD)    7. Pseudophakia of both eyes  Z96.1     8. Anatomical narrow angle  H40.039  1-3. Moderate nonproliferative diabetic retinopathy w/ DME, OU  - A1C 6.7 (02.09.23) - FA (07.03.23) shows: OD Focal leakage temporal macula, scattered leaking MA, staining of peripheral  laser, OS Mild peripheral nonperfusion nasal periphery, scattered MA with late leakage, no NV OU - repeat FA (09.30.24) is stable from July 2023 study -- no NV OU - exam with scattered MA OU -- OD w/ h/o VH as below  - BCVA OD 20/20 from 20/25, OS 20/25 stable - OCT shows OD: Mild persistent IRF / cystic changes temporal mac and fovea -- slightly improved centrally; OS: trace cystic changes superior fovea   - discussed tight BP control and BS control   - discussed findings, prognosis  - recommend continued monitoring   - f/u 3 months DFE, OCT, possible focal laser  4. Vitreous Hemorrhage OD - onset of floaters and vision loss beginning of Dec 2022 while putting up Christmas decorations - likely related to history of DM and HTN - pt also recalls multiple falls from vertigo around the same time of VH onset - s/p IVA OD #1 (12.08.22) - no significant improvement - pre op BCVA OD -- CF 1'  - s/p PPV/EL 12.22.22 OD - intra-op -- visualized peripheral ischemia and scattered DBH; no RT/RD - doing great - BCVA 20/20 OD from CF   - monitor   5,6. Hypertensive retinopathy OU  - discussed importance of tight BP control  - monitor  7. Pseudophakia OU  - s/p CE/IOL OU  - beautiful surgeries by Dr. Vonna Kotyk, doing well  - monitor  8. Anatomical Narrow Angles OU  - s/p LPI OU by Dr. Vonna Kotyk  - OS patent LPI and open angle  - OD w/ closed PI  - saw Sr. Wynelle Link on 09.21.20---no intervention recommended  Ophthalmic Meds Ordered this visit:  No orders of the defined types were placed in this encounter.    Return in about 3 months (around 02/26/2023) for f/u NPDR OU, DFE, OCT, possible focal laser.  There are no Patient Instructions on file for this visit.   Explained the diagnoses, plan, and follow up with the patient and they expressed understanding.  Patient expressed understanding of the importance of proper follow up care.   This document serves as a record of services personally performed  by Karie Chimera, MD, PhD. It was created on their behalf by Annalee Genta, COMT. The creation of this record is the provider's dictation and/or activities during the visit.  Electronically signed by: Annalee Genta, COMT 11/30/22 3:43 PM  This document serves as a record of services personally performed by Karie Chimera, MD, PhD. It was created on their behalf by Glee Arvin. Manson Passey, OA an ophthalmic technician. The creation of this record is the provider's dictation and/or activities during the visit.    Electronically signed by: Glee Arvin. Manson Passey, OA 11/30/22 3:43 PM  Karie Chimera, M.D., Ph.D. Diseases & Surgery of the Retina and Vitreous [facility]  I have reviewed the above documentation for accuracy and completeness, and I agree with the above. Karie Chimera, M.D., Ph.D. 11/30/22 4:01 PM  Abbreviations: M myopia (nearsighted); A astigmatism; H hyperopia (farsighted); P presbyopia; Mrx spectacle prescription;  CTL contact lenses; OD right eye; OS left eye; OU both eyes  XT exotropia; ET esotropia; PEK punctate epithelial keratitis; PEE punctate epithelial erosions; DES dry eye syndrome; MGD meibomian gland dysfunction; ATs artificial tears; PFAT's preservative free artificial tears; NSC nuclear sclerotic cataract; PSC posterior  subcapsular cataract; ERM epi-retinal membrane; PVD posterior vitreous detachment; RD retinal detachment; DM diabetes mellitus; DR diabetic retinopathy; NPDR non-proliferative diabetic retinopathy; PDR proliferative diabetic retinopathy; CSME clinically significant macular edema; DME diabetic macular edema; dbh dot blot hemorrhages; CWS cotton wool spot; POAG primary open angle glaucoma; C/D cup-to-disc ratio; HVF humphrey visual field; GVF goldmann visual field; OCT optical coherence tomography; IOP intraocular pressure; BRVO Branch retinal vein occlusion; CRVO central retinal vein occlusion; CRAO central retinal artery occlusion; BRAO branch  retinal artery occlusion; RT retinal tear; SB scleral buckle; PPV pars plana vitrectomy; VH Vitreous hemorrhage; PRP panretinal laser photocoagulation; IVK intravitreal kenalog; VMT vitreomacular traction; MH Macular hole;  NVD neovascularization of the disc; NVE neovascularization elsewhere; AREDS age related eye disease study; ARMD age related macular degeneration; POAG primary open angle glaucoma; EBMD epithelial/anterior basement membrane dystrophy; ACIOL anterior chamber intraocular lens; IOL intraocular lens; PCIOL posterior chamber intraocular lens; Phaco/IOL phacoemulsification with intraocular lens placement; PRK photorefractive keratectomy; LASIK laser assisted in situ keratomileusis; HTN hypertension; DM diabetes mellitus; COPD chronic obstructive pulmonary disease

## 2022-11-27 ENCOUNTER — Ambulatory Visit (INDEPENDENT_AMBULATORY_CARE_PROVIDER_SITE_OTHER): Payer: Medicare HMO | Admitting: Ophthalmology

## 2022-11-27 ENCOUNTER — Encounter (INDEPENDENT_AMBULATORY_CARE_PROVIDER_SITE_OTHER): Payer: Self-pay | Admitting: Ophthalmology

## 2022-11-27 DIAGNOSIS — Z7985 Long-term (current) use of injectable non-insulin antidiabetic drugs: Secondary | ICD-10-CM

## 2022-11-27 DIAGNOSIS — Z794 Long term (current) use of insulin: Secondary | ICD-10-CM

## 2022-11-27 DIAGNOSIS — I10 Essential (primary) hypertension: Secondary | ICD-10-CM | POA: Diagnosis not present

## 2022-11-27 DIAGNOSIS — H40039 Anatomical narrow angle, unspecified eye: Secondary | ICD-10-CM | POA: Diagnosis not present

## 2022-11-27 DIAGNOSIS — H35033 Hypertensive retinopathy, bilateral: Secondary | ICD-10-CM | POA: Diagnosis not present

## 2022-11-27 DIAGNOSIS — E113313 Type 2 diabetes mellitus with moderate nonproliferative diabetic retinopathy with macular edema, bilateral: Secondary | ICD-10-CM

## 2022-11-27 DIAGNOSIS — H4311 Vitreous hemorrhage, right eye: Secondary | ICD-10-CM

## 2022-11-27 DIAGNOSIS — Z961 Presence of intraocular lens: Secondary | ICD-10-CM | POA: Diagnosis not present

## 2022-11-30 ENCOUNTER — Encounter (INDEPENDENT_AMBULATORY_CARE_PROVIDER_SITE_OTHER): Payer: Self-pay | Admitting: Ophthalmology

## 2022-12-13 DIAGNOSIS — H524 Presbyopia: Secondary | ICD-10-CM | POA: Diagnosis not present

## 2023-01-30 ENCOUNTER — Other Ambulatory Visit (HOSPITAL_COMMUNITY): Payer: Self-pay | Admitting: Family Medicine

## 2023-01-30 DIAGNOSIS — Z1231 Encounter for screening mammogram for malignant neoplasm of breast: Secondary | ICD-10-CM

## 2023-02-13 NOTE — Progress Notes (Signed)
[facility] - Clinic Note  02/26/2023    CHIEF COMPLAINT Patient presents for Retina Follow Up   HISTORY OF PRESENT ILLNESS: Sandra Washington is a 82 y.o. female who presents to the clinic today for:   HPI     Retina Follow Up   Patient presents with  Other.  In right eye.  This started years ago.  Severity is moderate.  Duration of 3 months.  Since onset it is stable.  I, the attending physician,  performed the HPI with the patient and updated documentation appropriately.        Comments   Patient feels the vision is a little off in the right eye, she feels she is not seeing as well. She is not using eye drops. Her blood sugar was 117. She has stopped all diabetic medication due to no longer needing it per the doctor.       Last edited by Rennis Chris, MD on 02/26/2023 12:12 PM.    Pt states he got new glasses and feels like they are stronger than the ones she had, so she needs to get used to them, she states she's lost about 50lbs, so her dr took her off all diabetic medications  Referring physician: Renaye Rakers, MD [address] [place],  [place] [address]  HISTORICAL INFORMATION:   Selected notes from the MEDICAL RECORD NUMBER Referred by Dr. Daisy Lazar for concern of diabetic retinopathy   CURRENT MEDICATIONS: No current outpatient medications on file. (Ophthalmic Drugs)   No current facility-administered medications for this visit. (Ophthalmic Drugs)   Current Outpatient Medications (Other)  Medication Sig   ACCU-CHEK SOFTCLIX LANCETS lancets Use bid   acetaminophen (TYLENOL) 500 MG tablet Take 500-1,000 mg by mouth every 6 (six) hours as needed (for back aches).   amLODipine (NORVASC) 5 MG tablet Take 5 mg by mouth 2 (two) times daily.   atorvastatin (LIPITOR) 20 MG tablet Take 1 tablet (20 mg total) by mouth daily. (Patient taking differently: Take 20 mg by mouth at bedtime.)   Blood Glucose Monitoring Suppl (ACCU-CHEK GUIDE ME)  w/Device KIT 1 each by Does not apply route 2 (two) times daily. Use accu chek guide me device to check blood sugar twice daily.DX:E11.65   Cholecalciferol (VITAMIN D3) 5000 UNITS CAPS Take 5,000 Units by mouth daily.   glucose blood (ACCU-CHEK AVIVA PLUS) test strip Use to check blood sugar 2 times daily   niacin (SLO-NIACIN) 500 MG tablet Take 500 mg by mouth at bedtime.   omeprazole (PRILOSEC) 40 MG capsule Take 40 mg by mouth daily.   ondansetron (ZOFRAN) 4 MG tablet Take 1 tablet (4 mg total) by mouth every 6 (six) hours as needed for nausea.   valsartan-hydrochlorothiazide (DIOVAN-HCT) 160-12.5 MG tablet Take 1 tablet by mouth in the morning.   insulin NPH Human (NOVOLIN N RELION) 100 UNIT/ML injection 8 U in am and 12 U hs (Patient not taking: Reported on 02/26/2023)   insulin regular (NOVOLIN R RELION) 100 units/mL injection INJECT 8 UNITS SUBCUTANEOUSLY WITH BREAKFAST AND LUNCH, AND 16 UNITS WITH DINNER (Patient not taking: Reported on 02/26/2023)   No current facility-administered medications for this visit. (Other)   REVIEW OF SYSTEMS: ROS   Positive for: Gastrointestinal, Neurological, Endocrine, Eyes Negative for: Constitutional, Skin, Genitourinary, Musculoskeletal, HENT, Cardiovascular, Respiratory, Allergic/Imm, Heme/Lymph Last edited by Charlette Caffey, COT on 02/26/2023  9:07 AM.     ALLERGIES No Known Allergies  PAST MEDICAL HISTORY Past  Medical History:  Diagnosis Date   Complication of anesthesia    Diabetes mellitus without complication (HCC)    Diabetic retinopathy (HCC)    GERD (gastroesophageal reflux disease)    History of kidney stones    Hypertension    Hypertensive retinopathy    Stroke [facility])    age 31- no residual   Past Surgical History:  Procedure Laterality Date   BREAST BIOPSY Left    CATARACT EXTRACTION Bilateral 2018   Dr. Darel Hong   CHOLECYSTECTOMY N/A 07/01/2020   Procedure: LAPAROSCOPIC CHOLECYSTECTOMY;  Surgeon: Lucretia Roers,  MD;  Location: [facility];  Service: General;  Laterality: N/A;   EYE SURGERY     HAND SURGERY     IR RADIOLOGIST EVAL & MGMT  08/05/2020   NO PAST SURGERIES     PARS PLANA VITRECTOMY Right 02/17/2021   Procedure: PARS PLANA VITRECTOMY WITH 25 GAUGE;  Surgeon: Rennis Chris, MD;  Location: [facility] OR;  Service: Ophthalmology;  Laterality: Right;   PHOTOCOAGULATION WITH LASER Right 02/17/2021   Procedure: PHOTOCOAGULATION WITH LASER;  Surgeon: Rennis Chris, MD;  Location: [facility] OR;  Service: Ophthalmology;  Laterality: Right;   FAMILY HISTORY History reviewed. No pertinent family history.  SOCIAL HISTORY Social History   Tobacco Use   Smoking status: Former   Smokeless tobacco: Never   Tobacco comments:    Smoked in [employer]  Vaping Use   Vaping status: Former  Substance Use Topics   Alcohol use: No   Drug use: No       OPHTHALMIC EXAM:  Base Eye Exam     Visual Acuity (Snellen - Linear)       Right Left   Dist cc 20/20 +2 20/20 +2    Correction: Glasses         Tonometry (Tonopen, 9:10 AM)       Right Left   Pressure 12 10         Pupils       Dark Light Shape React APD   Right 3 2 Round Slow None   Left 3 2 Round Slow None         Visual Fields       Left Right    Full Full         Extraocular Movement       Right Left    Full, Ortho Full, Ortho         Neuro/Psych     Oriented x3: Yes   Mood/Affect: Normal         Dilation     Both eyes: 1.0% Mydriacyl, 2.5% Phenylephrine @ 9:08 AM           Slit Lamp and Fundus Exam     Slit Lamp Exam       Right Left   Lids/Lashes Dermatochalasis - upper lid Dermatochalasis - upper lid   Conjunctiva/Sclera melanosis, nasal and temporal pinguecula nasal and temporal Pinguecula, Melanosis   Cornea Arcus, Well healed temporal cataract wounds, trace PEE, mild tear film debris Arcus, Well healed temporal cataract wounds, trace EBMD   Anterior Chamber deep, clear, narrow temporal angle, No  cell or flare deep and clear   Iris Round and dilated, focal iris atrophy at 1100  --  PI not open, No NVI Round and dilated, small patent PI at 0200, mild bowing, No NVI   Lens PC IOL in good position with open PC, +elschnig pearls PC IOL in good position with open PC  Anterior Vitreous post vitrectomy, clear Vitreous syneresis         Fundus Exam       Right Left   Disc mild Pallor, Sharp rim, mild PPA/PPP Pink and Sharp, Compact, mild PPP   C/D Ratio 0.2 0.2   Macula Flat, Blunted foveal reflex, mild RPE mottling, focal drusen SN to fovea, no edema, scattered MA/DBH and cystic changes greatest temporal mac -- slightly increased, good focal laser targets ST mac Flat, Blunted foveal reflex, scattered Microaneurysms, trace cystic changes, no frank edema   Vessels attenuated, mild tortuosity attenuated, mild tortuosity   Periphery Attached, 360 MA/DBH, 360 peripheral laser changes greatest temporally, room for fill in if needed Attached, scattered MA greatest posteriorly, focal DBH temporal periphery           Refraction     Wearing Rx       Sphere Cylinder Axis Add   Right -2.25 +2.25 180 +2.50   Left -1.00 +1.50 175 +2.50    Type: prog           IMAGING AND PROCEDURES  Imaging and Procedures for @TODAY @  OCT, Retina - OU - Both Eyes       Right Eye Quality was good. Central Foveal Thickness: 290. Progression has worsened. Findings include normal foveal contour, no SRF, intraretinal fluid (Mild interval increase in IRF / cystic changes temporal mac and fovea ).   Left Eye Quality was good. Central Foveal Thickness: 254. Progression has worsened. Findings include normal foveal contour, no SRF, intraretinal hyper-reflective material, intraretinal fluid (Mild interval increase in cystic changes superior fovea ).   Notes *Images captured and stored on drive  Diagnosis / Impression:  OD: Mild interval increase in IRF / cystic changes temporal mac and fovea  OS: Mild  interval increase in cystic changes superior fovea   Clinical management:  See below  Abbreviations: NFP - Normal foveal profile. CME - cystoid macular edema. PED - pigment epithelial detachment. IRF - intraretinal fluid. SRF - subretinal fluid. EZ - ellipsoid zone. ERM - epiretinal membrane. ORA - outer retinal atrophy. ORT - outer retinal tubulation. SRHM - subretinal hyper-reflective material            ASSESSMENT/PLAN:    ICD-10-CM   1. Moderate nonproliferative diabetic retinopathy of both eyes with macular edema associated with type 2 diabetes mellitus (HCC)  E11.3313 OCT, Retina - OU - Both Eyes    2. Vitreous hemorrhage of right eye (HCC)  H43.11     3. Essential hypertension  I10     4. Hypertensive retinopathy of both eyes  H35.033     5. Pseudophakia of both eyes  Z96.1     6. Anatomical narrow angle  H40.039      1. Moderate nonproliferative diabetic retinopathy w/ DME, OU  - A1C 6.7 (02.09.23) - FA (07.03.23) shows: OD Focal leakage temporal macula, scattered leaking MA, staining of peripheral laser, OS Mild peripheral nonperfusion nasal periphery, scattered MA with late leakage, no NV OU - repeat FA (09.30.24) is stable from July 2023 study -- no NV OU - exam with scattered MA OU -- OD w/ h/o VH as below -- OD also with increased IRH temporal macula w/ increased cystic changes / edema -- good targets for focal laser  - BCVA OD 20/20 from 20/25, OS 20/25 stable - OCT shows OD: Mild interval increase in IRF / cystic changes temporal mac and fovea; OS: Mild interval increase in cystic changes superior fovea    -  discussed tight BP control and BS control   - discussed findings, prognosis  - recommend focal laser OD to progressive IRH temporal macula OD  - f/u January 20th at 945, DFE, OCT, focal laser OD  4. Vitreous Hemorrhage OD - onset of floaters and vision loss beginning of Dec 2022 while putting up Christmas decorations - likely related to history of DM and  HTN - pt also recalls multiple falls from vertigo around the same time of VH onset - s/p IVA OD #1 (12.08.22) - no significant improvement - pre op BCVA OD -- CF 1'  - s/p PPV/EL 12.22.22 OD - intra-op -- visualized peripheral ischemia and scattered DBH; no RT/RD - doing great - BCVA 20/20 OD from CF   - monitor   5,6. Hypertensive retinopathy OU  - discussed importance of tight BP control  - monitor  7. Pseudophakia OU  - s/p CE/IOL OU  - beautiful surgeries by Dr. Vonna Kotyk, doing well  - monitor  8. Anatomical Narrow Angles OU  - s/p LPI OU by Dr. Vonna Kotyk  - OS patent LPI and open angle  - OD w/ closed PI  - saw Sr. Wynelle Link on 09.21.20---no intervention recommended  Ophthalmic Meds Ordered this visit:  No orders of the defined types were placed in this encounter.    Return in about 3 weeks (around 03/19/2023) for f/u NPDR OU, DFE, OCT, focal laser OD.  There are no Patient Instructions on file for this visit.   Explained the diagnoses, plan, and follow up with the patient and they expressed understanding.  Patient expressed understanding of the importance of proper follow up care.   This document serves as a record of services personally performed by Karie Chimera, MD, PhD. It was created on their behalf by Glee Arvin. Manson Passey, OA an ophthalmic technician. The creation of this record is the provider's dictation and/or activities during the visit.    Electronically signed by: Glee Arvin. Manson Passey, OA 02/26/23 12:13 PM  Karie Chimera, M.D., Ph.D. Diseases & Surgery of the Retina and Vitreous [facility]  I have reviewed the above documentation for accuracy and completeness, and I agree with the above. Karie Chimera, M.D., Ph.D. 02/26/23 12:15 PM  Abbreviations: M myopia (nearsighted); A astigmatism; H hyperopia (farsighted); P presbyopia; Mrx spectacle prescription;  CTL contact lenses; OD right eye; OS left eye; OU both eyes  XT exotropia; ET esotropia; PEK  punctate epithelial keratitis; PEE punctate epithelial erosions; DES dry eye syndrome; MGD meibomian gland dysfunction; ATs artificial tears; PFAT's preservative free artificial tears; NSC nuclear sclerotic cataract; PSC posterior subcapsular cataract; ERM epi-retinal membrane; PVD posterior vitreous detachment; RD retinal detachment; DM diabetes mellitus; DR diabetic retinopathy; NPDR non-proliferative diabetic retinopathy; PDR proliferative diabetic retinopathy; CSME clinically significant macular edema; DME diabetic macular edema; dbh dot blot hemorrhages; CWS cotton wool spot; POAG primary open angle glaucoma; C/D cup-to-disc ratio; HVF humphrey visual field; GVF goldmann visual field; OCT optical coherence tomography; IOP intraocular pressure; BRVO Branch retinal vein occlusion; CRVO central retinal vein occlusion; CRAO central retinal artery occlusion; BRAO branch retinal artery occlusion; RT retinal tear; SB scleral buckle; PPV pars plana vitrectomy; VH Vitreous hemorrhage; PRP panretinal laser photocoagulation; IVK intravitreal kenalog; VMT vitreomacular traction; MH Macular hole;  NVD neovascularization of the disc; NVE neovascularization elsewhere; AREDS age related eye disease study; ARMD age related macular degeneration; POAG primary open angle glaucoma; EBMD epithelial/anterior basement membrane dystrophy; ACIOL anterior chamber intraocular lens; IOL intraocular lens;  PCIOL posterior chamber intraocular lens; Phaco/IOL phacoemulsification with intraocular lens placement; PRK photorefractive keratectomy; LASIK laser assisted in situ keratomileusis; HTN hypertension; DM diabetes mellitus; COPD chronic obstructive pulmonary disease

## 2023-02-26 ENCOUNTER — Ambulatory Visit (INDEPENDENT_AMBULATORY_CARE_PROVIDER_SITE_OTHER): Payer: Medicare HMO | Admitting: Ophthalmology

## 2023-02-26 ENCOUNTER — Encounter (INDEPENDENT_AMBULATORY_CARE_PROVIDER_SITE_OTHER): Payer: Self-pay | Admitting: Ophthalmology

## 2023-02-26 DIAGNOSIS — Z794 Long term (current) use of insulin: Secondary | ICD-10-CM

## 2023-02-26 DIAGNOSIS — I10 Essential (primary) hypertension: Secondary | ICD-10-CM | POA: Diagnosis not present

## 2023-02-26 DIAGNOSIS — H35033 Hypertensive retinopathy, bilateral: Secondary | ICD-10-CM | POA: Diagnosis not present

## 2023-02-26 DIAGNOSIS — E113313 Type 2 diabetes mellitus with moderate nonproliferative diabetic retinopathy with macular edema, bilateral: Secondary | ICD-10-CM | POA: Diagnosis not present

## 2023-02-26 DIAGNOSIS — Z961 Presence of intraocular lens: Secondary | ICD-10-CM | POA: Diagnosis not present

## 2023-02-26 DIAGNOSIS — H40039 Anatomical narrow angle, unspecified eye: Secondary | ICD-10-CM

## 2023-02-26 DIAGNOSIS — H4311 Vitreous hemorrhage, right eye: Secondary | ICD-10-CM | POA: Diagnosis not present

## 2023-02-26 DIAGNOSIS — Z7985 Long-term (current) use of injectable non-insulin antidiabetic drugs: Secondary | ICD-10-CM

## 2023-03-07 ENCOUNTER — Ambulatory Visit (HOSPITAL_COMMUNITY)
Admission: RE | Admit: 2023-03-07 | Discharge: 2023-03-07 | Disposition: A | Discharge status: Home / Self Care | Payer: PPO | Source: Ambulatory Visit | Attending: Family Medicine | Admitting: Family Medicine

## 2023-03-07 ENCOUNTER — Encounter (HOSPITAL_COMMUNITY): Payer: Self-pay

## 2023-03-07 DIAGNOSIS — Z1231 Encounter for screening mammogram for malignant neoplasm of breast: Secondary | ICD-10-CM | POA: Diagnosis not present

## 2023-03-19 ENCOUNTER — Encounter (INDEPENDENT_AMBULATORY_CARE_PROVIDER_SITE_OTHER): Payer: Medicare HMO | Admitting: Ophthalmology

## 2023-03-20 DIAGNOSIS — E1165 Type 2 diabetes mellitus with hyperglycemia: Secondary | ICD-10-CM | POA: Diagnosis not present

## 2023-03-20 DIAGNOSIS — I10 Essential (primary) hypertension: Secondary | ICD-10-CM | POA: Diagnosis not present

## 2023-03-20 DIAGNOSIS — M545 Low back pain, unspecified: Secondary | ICD-10-CM | POA: Diagnosis not present

## 2023-03-20 DIAGNOSIS — E782 Mixed hyperlipidemia: Secondary | ICD-10-CM | POA: Diagnosis not present

## 2023-03-21 NOTE — Progress Notes (Signed)
[facility] - Clinic Note  03/23/2023    CHIEF COMPLAINT Patient presents for Retina Follow Up   HISTORY OF PRESENT ILLNESS: Sandra Washington is a 83 y.o. female who presents to the clinic today for:   HPI     Retina Follow Up   Patient presents with  Diabetic Retinopathy.  In right eye.  This started 3 weeks ago.  Duration of 3 weeks.  Since onset it is stable.  I, the attending physician,  performed the HPI with the patient and updated documentation appropriately.        Comments   3 week retina follow up NPDR and focal laser OD pt is reporting no vision changes noticed she has some floaters but denies any flashes  her last reading 129 this am       Last edited by Rennis Chris, MD on 03/23/2023 11:24 AM.     Referring physician: Renaye Rakers, MD [address] [place],  [place] [address]  HISTORICAL INFORMATION:   Selected notes from the MEDICAL RECORD NUMBER Referred by Dr. Daisy Lazar for concern of diabetic retinopathy   CURRENT MEDICATIONS: Current Outpatient Medications (Ophthalmic Drugs)  Medication Sig   prednisoLONE acetate (PRED FORTE) 1 % ophthalmic suspension Place 1 drop into the right eye 4 (four) times daily for 7 days.   No current facility-administered medications for this visit. (Ophthalmic Drugs)   Current Outpatient Medications (Other)  Medication Sig   ACCU-CHEK SOFTCLIX LANCETS lancets Use bid   acetaminophen (TYLENOL) 500 MG tablet Take 500-1,000 mg by mouth every 6 (six) hours as needed (for back aches).   amLODipine (NORVASC) 5 MG tablet Take 5 mg by mouth 2 (two) times daily.   atorvastatin (LIPITOR) 20 MG tablet Take 1 tablet (20 mg total) by mouth daily. (Patient taking differently: Take 20 mg by mouth at bedtime.)   Blood Glucose Monitoring Suppl (ACCU-CHEK GUIDE ME) w/Device KIT 1 each by Does not apply route 2 (two) times daily. Use accu chek guide me device to check blood sugar twice daily.DX:E11.65    Cholecalciferol (VITAMIN D3) 5000 UNITS CAPS Take 5,000 Units by mouth daily.   glucose blood (ACCU-CHEK AVIVA PLUS) test strip Use to check blood sugar 2 times daily   insulin NPH Human (NOVOLIN N RELION) 100 UNIT/ML injection 8 U in am and 12 U hs (Patient not taking: Reported on 02/26/2023)   insulin regular (NOVOLIN R RELION) 100 units/mL injection INJECT 8 UNITS SUBCUTANEOUSLY WITH BREAKFAST AND LUNCH, AND 16 UNITS WITH DINNER (Patient not taking: Reported on 02/26/2023)   niacin (SLO-NIACIN) 500 MG tablet Take 500 mg by mouth at bedtime.   omeprazole (PRILOSEC) 40 MG capsule Take 40 mg by mouth daily.   ondansetron (ZOFRAN) 4 MG tablet Take 1 tablet (4 mg total) by mouth every 6 (six) hours as needed for nausea.   valsartan-hydrochlorothiazide (DIOVAN-HCT) 160-12.5 MG tablet Take 1 tablet by mouth in the morning.   No current facility-administered medications for this visit. (Other)   REVIEW OF SYSTEMS: ROS   Positive for: Gastrointestinal, Neurological, Endocrine, Eyes Negative for: Constitutional, Skin, Genitourinary, Musculoskeletal, HENT, Cardiovascular, Respiratory, Allergic/Imm, Heme/Lymph Last edited by Etheleen Mayhew, COT on 03/23/2023  9:42 AM.     ALLERGIES No Known Allergies  PAST MEDICAL HISTORY Past Medical History:  Diagnosis Date   Complication of anesthesia    Diabetes mellitus without complication (HCC)    Diabetic retinopathy (HCC)    GERD (gastroesophageal reflux disease)  History of kidney stones    Hypertension    Hypertensive retinopathy    Stroke [facility])    age 17- no residual   Past Surgical History:  Procedure Laterality Date   BREAST BIOPSY Bilateral    benign years ago   CATARACT EXTRACTION Bilateral 2018   Dr. Darel Hong   CHOLECYSTECTOMY N/A 07/01/2020   Procedure: LAPAROSCOPIC CHOLECYSTECTOMY;  Surgeon: Lucretia Roers, MD;  Location: [facility];  Service: General;  Laterality: N/A;   EYE SURGERY     HAND SURGERY     IR RADIOLOGIST  EVAL & MGMT  08/05/2020   NO PAST SURGERIES     PARS PLANA VITRECTOMY Right 02/17/2021   Procedure: PARS PLANA VITRECTOMY WITH 25 GAUGE;  Surgeon: Rennis Chris, MD;  Location: [facility] OR;  Service: Ophthalmology;  Laterality: Right;   PHOTOCOAGULATION WITH LASER Right 02/17/2021   Procedure: PHOTOCOAGULATION WITH LASER;  Surgeon: Rennis Chris, MD;  Location: [facility] OR;  Service: Ophthalmology;  Laterality: Right;   FAMILY HISTORY History reviewed. No pertinent family history.  SOCIAL HISTORY Social History   Tobacco Use   Smoking status: Former   Smokeless tobacco: Never   Tobacco comments:    Smoked in [employer]  Vaping Use   Vaping status: Former  Substance Use Topics   Alcohol use: No   Drug use: No       OPHTHALMIC EXAM:  Base Eye Exam     Visual Acuity (Snellen - Linear)       Right Left   Dist cc 20/20 -1 20/20         Tonometry (Tonopen, 9:43 AM)       Right Left   Pressure 12 12         Pupils       Dark Light Shape React APD   Right 3 2 Round Brisk None   Left 3 2 Round Brisk None         Visual Fields       Left Right    Full Full         Extraocular Movement       Right Left    Full, Ortho Full, Ortho         Neuro/Psych     Oriented x3: Yes   Mood/Affect: Normal         Dilation     Right eye:            Slit Lamp and Fundus Exam     Slit Lamp Exam       Right Left   Lids/Lashes Dermatochalasis - upper lid Dermatochalasis - upper lid   Conjunctiva/Sclera melanosis, nasal and temporal pinguecula nasal and temporal Pinguecula, Melanosis   Cornea Arcus, Well healed temporal cataract wounds, trace PEE, mild tear film debris Arcus, Well healed temporal cataract wounds, trace EBMD   Anterior Chamber deep, clear, narrow temporal angle, No cell or flare deep and clear   Iris Round and dilated, focal iris atrophy at 1100  --  PI not open, No NVI Round and dilated, small patent PI at 0200, mild bowing, No NVI   Lens PC IOL  in good position with open PC, +elschnig pearls PC IOL in good position with open PC   Anterior Vitreous post vitrectomy, clear Vitreous syneresis         Fundus Exam       Right Left   Disc mild Pallor, Sharp rim, mild PPA/PPP Pink and Sharp, Compact, mild PPP  C/D Ratio 0.2 0.2   Macula Flat, Blunted foveal reflex, mild RPE mottling, focal drusen SN to fovea, no edema, scattered MA/DBH and cystic changes greatest temporal mac -- slightly increased, good focal laser targets ST mac Flat, Blunted foveal reflex, scattered Microaneurysms, trace cystic changes, no frank edema   Vessels attenuated, mild tortuosity attenuated, mild tortuosity   Periphery Attached, 360 MA/DBH, 360 peripheral laser changes greatest temporally, room for fill in if needed Attached, scattered MA greatest posteriorly, focal DBH temporal periphery           Refraction     Wearing Rx       Sphere Cylinder Axis Add   Right -2.25 +2.25 180 +2.50   Left -1.00 +1.50 175 +2.50    Type: prog           IMAGING AND PROCEDURES  Imaging and Procedures for @TODAY @  OCT, Retina - OU - Both Eyes       Right Eye Quality was good. Central Foveal Thickness: 288. Progression has improved. Findings include normal foveal contour, no SRF, intraretinal fluid (Mild interval improvement in IRF / cystic changes temporal mac and fovea ).   Left Eye Quality was good. Central Foveal Thickness: 244. Progression has improved. Findings include normal foveal contour, no SRF, intraretinal hyper-reflective material, intraretinal fluid (Mild interval improvement in cystic changes superior fovea ).   Notes *Images captured and stored on drive  Diagnosis / Impression:  OD: Mild interval improvment in IRF / cystic changes temporal mac and fovea  OS: Mild interval improvement in cystic changes superior fovea   Clinical management:  See below  Abbreviations: NFP - Normal foveal profile. CME - cystoid macular edema. PED -  pigment epithelial detachment. IRF - intraretinal fluid. SRF - subretinal fluid. EZ - ellipsoid zone. ERM - epiretinal membrane. ORA - outer retinal atrophy. ORT - outer retinal tubulation. SRHM - subretinal hyper-reflective material      Focal Laser - OD - Right Eye       LASER PROCEDURE NOTE  Diagnosis:   Diabetic macular edema, RIGHT EYE  Procedure:  Focal laser photocoagulation using slit lamp laser, RIGHT EYE  Anesthesia:  Topical  Surgeon: Rennis Chris, MD, PhD  Informed consent obtained, operative eye marked, and time out performed prior to initiation of laser.   Lumenis [id number] Focal/Grid laser Lens: Ocular OMRA-S Power: 90 mW Duration: 50 msec  Spot size: 100 microns  # spots: 89 spots placed to parafoveal MAs  Complications: None.  RTC: 6 wks  Patient tolerated the procedure well and received written and verbal post-procedure care information/education.             ASSESSMENT/PLAN:    ICD-10-CM   1. Moderate nonproliferative diabetic retinopathy of both eyes with macular edema associated with type 2 diabetes mellitus (HCC)  E11.3313 OCT, Retina - OU - Both Eyes    Focal Laser - OD - Right Eye    2. Vitreous hemorrhage of right eye (HCC)  H43.11     3. Essential hypertension  I10     4. Hypertensive retinopathy of both eyes  H35.033     5. Pseudophakia of both eyes  Z96.1     6. Anatomical narrow angle  H40.039     7. Encounter for long-term (current) use of insulin (HCC)  Z79.4     8. Long-term current use of injectable noninsulin antidiabetic medication  Z79.85      1. Moderate nonproliferative diabetic retinopathy w/ DME, OU  -  A1C 6.7 (02.09.23) - FA (07.03.23) shows: OD Focal leakage temporal macula, scattered leaking MA, staining of peripheral laser, OS Mild peripheral nonperfusion nasal periphery, scattered MA with late leakage, no NV OU - repeat FA (09.30.24) is stable from July 2023 study -- no NV OU - exam with scattered MA OU -- OD  w/ h/o VH as below -- OD also with increased IRH temporal macula w/ persistent cystic changes / edema -- good targets for focal laser  - BCVA 20/20 OU - OCT shows OD: Mild interval improvment in IRF / cystic changes temporal mac and fovea; OS: Mild interval improvement in cystic changes superior fovea     - discussed tight BP control and BS control   - discussed findings, prognosis  - recommend focal laser OD to progressive IRH temporal macula OD  - pt wishes to proceed with laser  - RBA of procedure discussed, questions answered - informed consent obtained and signed - see procedure note - start PF QID OD x7 days  - f/u 6 wks  4. Vitreous Hemorrhage OD - onset of floaters and vision loss beginning of Dec 2022 while putting up Christmas decorations - likely related to history of DM and HTN - pt also recalls multiple falls from vertigo around the same time of VH onset - s/p IVA OD #1 (12.08.22) - no significant improvement - pre op BCVA OD -- CF 1'  - s/p PPV/EL 12.22.22 OD - intra-op -- visualized peripheral ischemia and scattered DBH; no RT/RD - doing great - BCVA 20/20 OD from CF   - monitor   5,6. Hypertensive retinopathy OU  - discussed importance of tight BP control  - monitor  7. Pseudophakia OU  - s/p CE/IOL OU  - beautiful surgeries by Dr. Vonna Kotyk, doing well  - monitor  8. Anatomical Narrow Angles OU  - s/p LPI OU by Dr. Vonna Kotyk  - OS patent LPI and open angle  - OD w/ closed PI  - saw Sr. Wynelle Link on 09.21.20---no intervention recommended  Ophthalmic Meds Ordered this visit:  Meds ordered this encounter  Medications   prednisoLONE acetate (PRED FORTE) 1 % ophthalmic suspension    Sig: Place 1 drop into the right eye 4 (four) times daily for 7 days.    Dispense:  1.4 mL    Refill:  0     Return in about 6 weeks (around 05/04/2023) for f/u NPDR OU, DFE, OCT.  There are no Patient Instructions on file for this visit.   This document serves as a record of services  personally performed by Karie Chimera, MD, PhD. It was created on their behalf by Berlin Hun COT, an ophthalmic technician. The creation of this record is the provider's dictation and/or activities during the visit.    Electronically signed by: Berlin Hun COT 01.22.25  11:35 AM  Karie Chimera, M.D., Ph.D. Diseases & Surgery of the Retina and Vitreous [facility] 03/23/2023   I have reviewed the above documentation for accuracy and completeness, and I agree with the above. Karie Chimera, M.D., Ph.D. 03/23/23 11:37 AM  Abbreviations: M myopia (nearsighted); A astigmatism; H hyperopia (farsighted); P presbyopia; Mrx spectacle prescription;  CTL contact lenses; OD right eye; OS left eye; OU both eyes  XT exotropia; ET esotropia; PEK punctate epithelial keratitis; PEE punctate epithelial erosions; DES dry eye syndrome; MGD meibomian gland dysfunction; ATs artificial tears; PFAT's preservative free artificial tears; NSC nuclear sclerotic cataract; PSC posterior subcapsular cataract;  ERM epi-retinal membrane; PVD posterior vitreous detachment; RD retinal detachment; DM diabetes mellitus; DR diabetic retinopathy; NPDR non-proliferative diabetic retinopathy; PDR proliferative diabetic retinopathy; CSME clinically significant macular edema; DME diabetic macular edema; dbh dot blot hemorrhages; CWS cotton wool spot; POAG primary open angle glaucoma; C/D cup-to-disc ratio; HVF humphrey visual field; GVF goldmann visual field; OCT optical coherence tomography; IOP intraocular pressure; BRVO Branch retinal vein occlusion; CRVO central retinal vein occlusion; CRAO central retinal artery occlusion; BRAO branch retinal artery occlusion; RT retinal tear; SB scleral buckle; PPV pars plana vitrectomy; VH Vitreous hemorrhage; PRP panretinal laser photocoagulation; IVK intravitreal kenalog; VMT vitreomacular traction; MH Macular hole;  NVD neovascularization of the disc; NVE  neovascularization elsewhere; AREDS age related eye disease study; ARMD age related macular degeneration; POAG primary open angle glaucoma; EBMD epithelial/anterior basement membrane dystrophy; ACIOL anterior chamber intraocular lens; IOL intraocular lens; PCIOL posterior chamber intraocular lens; Phaco/IOL phacoemulsification with intraocular lens placement; PRK photorefractive keratectomy; LASIK laser assisted in situ keratomileusis; HTN hypertension; DM diabetes mellitus; COPD chronic obstructive pulmonary disease

## 2023-03-23 ENCOUNTER — Ambulatory Visit (INDEPENDENT_AMBULATORY_CARE_PROVIDER_SITE_OTHER): Payer: PPO | Admitting: Ophthalmology

## 2023-03-23 ENCOUNTER — Encounter (INDEPENDENT_AMBULATORY_CARE_PROVIDER_SITE_OTHER): Payer: Self-pay | Admitting: Ophthalmology

## 2023-03-23 DIAGNOSIS — I10 Essential (primary) hypertension: Secondary | ICD-10-CM | POA: Diagnosis not present

## 2023-03-23 DIAGNOSIS — H4311 Vitreous hemorrhage, right eye: Secondary | ICD-10-CM

## 2023-03-23 DIAGNOSIS — H40039 Anatomical narrow angle, unspecified eye: Secondary | ICD-10-CM

## 2023-03-23 DIAGNOSIS — Z7985 Long-term (current) use of injectable non-insulin antidiabetic drugs: Secondary | ICD-10-CM | POA: Diagnosis not present

## 2023-03-23 DIAGNOSIS — E113313 Type 2 diabetes mellitus with moderate nonproliferative diabetic retinopathy with macular edema, bilateral: Secondary | ICD-10-CM

## 2023-03-23 DIAGNOSIS — Z794 Long term (current) use of insulin: Secondary | ICD-10-CM | POA: Diagnosis not present

## 2023-03-23 DIAGNOSIS — H35033 Hypertensive retinopathy, bilateral: Secondary | ICD-10-CM | POA: Diagnosis not present

## 2023-03-23 DIAGNOSIS — Z961 Presence of intraocular lens: Secondary | ICD-10-CM

## 2023-03-23 MED ORDER — PREDNISOLONE ACETATE 1 % OP SUSP: 1.0000 [drp] | Freq: Four times a day (QID) | OPHTHALMIC | 0 refills | Status: AC

## 2023-04-13 DIAGNOSIS — E113519 Type 2 diabetes mellitus with proliferative diabetic retinopathy with macular edema, unspecified eye: Secondary | ICD-10-CM | POA: Diagnosis not present

## 2023-04-13 DIAGNOSIS — M79669 Pain in unspecified lower leg: Secondary | ICD-10-CM | POA: Diagnosis not present

## 2023-04-27 ENCOUNTER — Encounter (INDEPENDENT_AMBULATORY_CARE_PROVIDER_SITE_OTHER): Payer: PPO | Admitting: Ophthalmology

## 2023-04-30 NOTE — Progress Notes (Signed)
 [facility] - Clinic Note  05/07/2023    CHIEF COMPLAINT Patient presents for Diabetic Retinopathy with Macular Edema   HISTORY OF PRESENT ILLNESS: Sandra Washington is a 83 y.o. female who presents to the clinic today for:   HPI   Pt states her right eye is still is just a little bit blurry. Pt denies any discomfort. Pt is not using any drops.   BS 121 today A1c was 7 about 3 weeks ago.  Last edited by Rennis Chris, MD on 05/07/2023 10:06 PM.    Pt states her BP and blood sugar are doing well  Referring physician: Renaye Rakers, MD [address] [place],  [place] [address]  HISTORICAL INFORMATION:   Selected notes from the MEDICAL RECORD NUMBER Referred by Dr. Daisy Lazar for concern of diabetic retinopathy   CURRENT MEDICATIONS: No current outpatient medications on file. (Ophthalmic Drugs)   No current facility-administered medications for this visit. (Ophthalmic Drugs)   Current Outpatient Medications (Other)  Medication Sig   ACCU-CHEK SOFTCLIX LANCETS lancets Use bid   acetaminophen (TYLENOL) 500 MG tablet Take 500-1,000 mg by mouth every 6 (six) hours as needed (for back aches).   amLODipine (NORVASC) 5 MG tablet Take 5 mg by mouth 2 (two) times daily.   atorvastatin (LIPITOR) 20 MG tablet Take 1 tablet (20 mg total) by mouth daily. (Patient taking differently: Take 20 mg by mouth at bedtime.)   Blood Glucose Monitoring Suppl (ACCU-CHEK GUIDE ME) w/Device KIT 1 each by Does not apply route 2 (two) times daily. Use accu chek guide me device to check blood sugar twice daily.DX:E11.65   Cholecalciferol (VITAMIN D3) 5000 UNITS CAPS Take 5,000 Units by mouth daily.   empagliflozin (JARDIANCE) 25 MG TABS tablet Take 25 mg by mouth daily.   glucose blood (ACCU-CHEK AVIVA PLUS) test strip Use to check blood sugar 2 times daily   niacin (SLO-NIACIN) 500 MG tablet Take 500 mg by mouth at bedtime.   omeprazole (PRILOSEC) 40 MG capsule Take 40 mg by  mouth daily.   ondansetron (ZOFRAN) 4 MG tablet Take 1 tablet (4 mg total) by mouth every 6 (six) hours as needed for nausea.   valsartan-hydrochlorothiazide (DIOVAN-HCT) 160-12.5 MG tablet Take 1 tablet by mouth in the morning.   insulin NPH Human (NOVOLIN N RELION) 100 UNIT/ML injection 8 U in am and 12 U hs (Patient not taking: Reported on 02/26/2023)   insulin regular (NOVOLIN R RELION) 100 units/mL injection INJECT 8 UNITS SUBCUTANEOUSLY WITH BREAKFAST AND LUNCH, AND 16 UNITS WITH DINNER (Patient not taking: Reported on 02/26/2023)   No current facility-administered medications for this visit. (Other)   REVIEW OF SYSTEMS: ROS   Positive for: Gastrointestinal, Neurological, Endocrine, Eyes Negative for: Constitutional, Skin, Genitourinary, Musculoskeletal, HENT, Cardiovascular, Respiratory, Psychiatric, Allergic/Imm, Heme/Lymph Last edited by Elicia Lamp, COT on 05/07/2023  9:11 AM.     ALLERGIES No Known Allergies  PAST MEDICAL HISTORY Past Medical History:  Diagnosis Date   Complication of anesthesia    Diabetes mellitus without complication (HCC)    Diabetic retinopathy (HCC)    GERD (gastroesophageal reflux disease)    History of kidney stones    Hypertension    Hypertensive retinopathy    Stroke [facility])    age 59- no residual   Past Surgical History:  Procedure Laterality Date   BREAST BIOPSY Bilateral    benign years ago   CATARACT EXTRACTION Bilateral 2018   Dr. Darel Hong  CHOLECYSTECTOMY N/A 07/01/2020   Procedure: LAPAROSCOPIC CHOLECYSTECTOMY;  Surgeon: Lucretia Roers, MD;  Location: [facility];  Service: General;  Laterality: N/A;   EYE SURGERY     HAND SURGERY     IR RADIOLOGIST EVAL & MGMT  08/05/2020   NO PAST SURGERIES     PARS PLANA VITRECTOMY Right 02/17/2021   Procedure: PARS PLANA VITRECTOMY WITH 25 GAUGE;  Surgeon: Rennis Chris, MD;  Location: [facility] OR;  Service: Ophthalmology;  Laterality: Right;   PHOTOCOAGULATION WITH LASER Right 02/17/2021    Procedure: PHOTOCOAGULATION WITH LASER;  Surgeon: Rennis Chris, MD;  Location: [facility] OR;  Service: Ophthalmology;  Laterality: Right;   FAMILY HISTORY No family history on file.  SOCIAL HISTORY Social History   Tobacco Use   Smoking status: Former   Smokeless tobacco: Never   Tobacco comments:    Smoked in [employer]  Vaping Use   Vaping status: Former  Substance Use Topics   Alcohol use: No   Drug use: No       OPHTHALMIC EXAM:  Base Eye Exam     Visual Acuity (Snellen - Linear)       Right Left   Dist cc 20/40 +1 20/25 -2   Dist ph cc 20/30 -1 NI    Correction: Glasses         Tonometry (Tonopen, 9:33 AM)       Right Left   Pressure 12 10         Pupils       Dark Light Shape React APD   Right 3 2 Round Brisk None   Left 3 2 Round Brisk None         Visual Fields (Counting fingers)       Left Right    Full Full         Extraocular Movement       Right Left    Full, Ortho Full, Ortho         Neuro/Psych     Oriented x3: Yes   Mood/Affect: Normal         Dilation     Both eyes: 1.0% Mydriacyl, 2.5% Phenylephrine @ 9:30 AM           Slit Lamp and Fundus Exam     Slit Lamp Exam       Right Left   Lids/Lashes Dermatochalasis - upper lid Dermatochalasis - upper lid   Conjunctiva/Sclera melanosis, nasal and temporal pinguecula nasal and temporal Pinguecula, Melanosis   Cornea Arcus, Well healed temporal cataract wounds, trace PEE, mild tear film debris Arcus, Well healed temporal cataract wounds, trace EBMD   Anterior Chamber deep, clear, narrow temporal angle, No cell or flare deep and clear   Iris Round and dilated, focal iris atrophy at 1100  --  PI not open, No NVI Round and dilated, small patent PI at 0200, mild bowing, No NVI   Lens PC IOL in good position with open PC, +elschnig pearls PC IOL in good position with open PC   Anterior Vitreous post vitrectomy, clear Vitreous syneresis         Fundus Exam        Right Left   Disc mild Pallor, Sharp rim, mild PPA/PPP Pink and Sharp, Compact, mild PPP   C/D Ratio 0.2 0.2   Macula Flat, Blunted foveal reflex, mild RPE mottling, focal drusen SN to fovea, no edema, scattered MA/DBH and cystic changes greatest temporal mac -- improved, good focal laser  changes Flat, Blunted foveal reflex, scattered Microaneurysms, trace cystic changes, no frank edema   Vessels attenuated, mild tortuosity attenuated, mild tortuosity   Periphery Attached, 360 MA/DBH, 360 peripheral laser changes greatest temporally, room for fill in if needed Attached, scattered MA greatest posteriorly, focal DBH temporal periphery           Refraction     Wearing Rx       Sphere Cylinder Axis Add   Right -2.25 +2.25 180 +2.50   Left -1.00 +1.50 175 +2.50    Type: prog         Manifest Refraction (Subjective)       Sphere Cylinder Axis Dist VA   Right -3.00 +2.75 180 20-2   Left -100.00 +1.50 175 25-2           IMAGING AND PROCEDURES  Imaging and Procedures for @TODAY @  OCT, Retina - OU - Both Eyes       Right Eye Quality was good. Central Foveal Thickness: 310. Progression has improved. Findings include normal foveal contour, intraretinal fluid, subretinal fluid (Mild interval improvement in IRF / cystic changes temporal mac and fovea, focal shallow SRF centrally).   Left Eye Quality was good. Central Foveal Thickness: 249. Progression has been stable. Findings include normal foveal contour, no SRF, intraretinal hyper-reflective material, intraretinal fluid (Trace cystic changes superior fovea ).   Notes *Images captured and stored on drive  Diagnosis / Impression:  OD: Mild interval improvment in IRF / cystic changes temporal mac and fovea, focal shallow SRF centrally OS: trace cystic changes superior fovea   Clinical management:  See below  Abbreviations: NFP - Normal foveal profile. CME - cystoid macular edema. PED - pigment epithelial detachment. IRF -  intraretinal fluid. SRF - subretinal fluid. EZ - ellipsoid zone. ERM - epiretinal membrane. ORA - outer retinal atrophy. ORT - outer retinal tubulation. SRHM - subretinal hyper-reflective material            ASSESSMENT/PLAN:    ICD-10-CM   1. Moderate nonproliferative diabetic retinopathy of both eyes with macular edema associated with type 2 diabetes mellitus (HCC)  E11.3313 OCT, Retina - OU - Both Eyes    2. Long-term current use of injectable noninsulin antidiabetic medication  Z79.85     3. Encounter for long-term (current) use of insulin (HCC)  Z79.4     4. Vitreous hemorrhage of right eye (HCC)  H43.11     5. Essential hypertension  I10     6. Hypertensive retinopathy of both eyes  H35.033     7. Pseudophakia of both eyes  Z96.1     8. Anatomical narrow angle  H40.039      1-3. Moderate nonproliferative diabetic retinopathy w/ DME, OU  - A1C 6.7 (02.09.23) - FA (07.03.23) shows: OD Focal leakage temporal macula, scattered leaking MA, staining of peripheral laser; OS Mild peripheral nonperfusion nasal periphery, scattered MA with late leakage, no NV OU - repeat FA (09.30.24) is stable from July 2023 study -- no NV OU - s/p focal laser OD (01.24.25) - exam with scattered MA OU -- OD w/ h/o VH as below and good focal laser changes  - BCVA OD 20/20 - stable; OS 20/25 from 20/20 - OCT shows OD: Mild interval improvment in IRF / cystic changes temporal mac and fovea, focal shallow SRF centrally; OS: trace cystic changes superior fovea    - discussed tight BP control and BS control   - discussed findings, prognosis  - f/u 4-6  wks  4. Vitreous Hemorrhage OD - onset of floaters and vision loss beginning of Dec 2022 while putting up Christmas decorations - likely related to history of DM and HTN - pt also recalls multiple falls from vertigo around the same time of VH onset - s/p IVA OD #1 (12.08.22) - no significant improvement - pre op BCVA OD -- CF 1'  - s/p PPV/EL  12.22.22 OD - intra-op -- visualized peripheral ischemia and scattered DBH; no RT/RD - doing great - BCVA 20/20 OD from CF -- stable  - monitor   5,6. Hypertensive retinopathy OU  - discussed importance of tight BP control  - monitor  7. Pseudophakia OU  - s/p CE/IOL OU  - beautiful surgeries by Dr. Vonna Kotyk, doing well  - monitor  8. Anatomical Narrow Angles OU  - s/p LPI OU by Dr. Vonna Kotyk  - OS patent LPI and open angle  - OD w/ closed PI  - saw Sr. Wynelle Link on 09.21.20---no intervention recommended  Ophthalmic Meds Ordered this visit:  No orders of the defined types were placed in this encounter.    Return for f/u 4-6 weeks, NPDR OU, DFE, OCT.  There are no Patient Instructions on file for this visit.   This document serves as a record of services personally performed by Karie Chimera, MD, PhD. It was created on their behalf by Glee Arvin. Manson Passey, OA an ophthalmic technician. The creation of this record is the provider's dictation and/or activities during the visit.    Electronically signed by: Glee Arvin. Manson Passey, OA 05/07/23 10:06 PM   Karie Chimera, M.D., Ph.D. Diseases & Surgery of the Retina and Vitreous [facility] 05/07/2023   I have reviewed the above documentation for accuracy and completeness, and I agree with the above. Karie Chimera, M.D., Ph.D. 05/07/23 10:06 PM   Abbreviations: M myopia (nearsighted); A astigmatism; H hyperopia (farsighted); P presbyopia; Mrx spectacle prescription;  CTL contact lenses; OD right eye; OS left eye; OU both eyes  XT exotropia; ET esotropia; PEK punctate epithelial keratitis; PEE punctate epithelial erosions; DES dry eye syndrome; MGD meibomian gland dysfunction; ATs artificial tears; PFAT's preservative free artificial tears; NSC nuclear sclerotic cataract; PSC posterior subcapsular cataract; ERM epi-retinal membrane; PVD posterior vitreous detachment; RD retinal detachment; DM diabetes mellitus; DR diabetic  retinopathy; NPDR non-proliferative diabetic retinopathy; PDR proliferative diabetic retinopathy; CSME clinically significant macular edema; DME diabetic macular edema; dbh dot blot hemorrhages; CWS cotton wool spot; POAG primary open angle glaucoma; C/D cup-to-disc ratio; HVF humphrey visual field; GVF goldmann visual field; OCT optical coherence tomography; IOP intraocular pressure; BRVO Branch retinal vein occlusion; CRVO central retinal vein occlusion; CRAO central retinal artery occlusion; BRAO branch retinal artery occlusion; RT retinal tear; SB scleral buckle; PPV pars plana vitrectomy; VH Vitreous hemorrhage; PRP panretinal laser photocoagulation; IVK intravitreal kenalog; VMT vitreomacular traction; MH Macular hole;  NVD neovascularization of the disc; NVE neovascularization elsewhere; AREDS age related eye disease study; ARMD age related macular degeneration; POAG primary open angle glaucoma; EBMD epithelial/anterior basement membrane dystrophy; ACIOL anterior chamber intraocular lens; IOL intraocular lens; PCIOL posterior chamber intraocular lens; Phaco/IOL phacoemulsification with intraocular lens placement; PRK photorefractive keratectomy; LASIK laser assisted in situ keratomileusis; HTN hypertension; DM diabetes mellitus; COPD chronic obstructive pulmonary disease

## 2023-05-07 ENCOUNTER — Ambulatory Visit (INDEPENDENT_AMBULATORY_CARE_PROVIDER_SITE_OTHER): Payer: PPO | Admitting: Ophthalmology

## 2023-05-07 ENCOUNTER — Encounter (INDEPENDENT_AMBULATORY_CARE_PROVIDER_SITE_OTHER): Payer: Self-pay | Admitting: Ophthalmology

## 2023-05-07 DIAGNOSIS — Z7985 Long-term (current) use of injectable non-insulin antidiabetic drugs: Secondary | ICD-10-CM

## 2023-05-07 DIAGNOSIS — E113313 Type 2 diabetes mellitus with moderate nonproliferative diabetic retinopathy with macular edema, bilateral: Secondary | ICD-10-CM | POA: Diagnosis not present

## 2023-05-07 DIAGNOSIS — H35033 Hypertensive retinopathy, bilateral: Secondary | ICD-10-CM | POA: Diagnosis not present

## 2023-05-07 DIAGNOSIS — Z794 Long term (current) use of insulin: Secondary | ICD-10-CM

## 2023-05-07 DIAGNOSIS — I10 Essential (primary) hypertension: Secondary | ICD-10-CM | POA: Diagnosis not present

## 2023-05-07 DIAGNOSIS — Z961 Presence of intraocular lens: Secondary | ICD-10-CM

## 2023-05-07 DIAGNOSIS — H4311 Vitreous hemorrhage, right eye: Secondary | ICD-10-CM | POA: Diagnosis not present

## 2023-05-07 DIAGNOSIS — H40039 Anatomical narrow angle, unspecified eye: Secondary | ICD-10-CM

## 2023-05-11 DIAGNOSIS — G8929 Other chronic pain: Secondary | ICD-10-CM | POA: Diagnosis not present

## 2023-05-11 DIAGNOSIS — M1990 Unspecified osteoarthritis, unspecified site: Secondary | ICD-10-CM | POA: Diagnosis not present

## 2023-05-11 DIAGNOSIS — F419 Anxiety disorder, unspecified: Secondary | ICD-10-CM | POA: Diagnosis not present

## 2023-05-11 DIAGNOSIS — K5900 Constipation, unspecified: Secondary | ICD-10-CM | POA: Diagnosis not present

## 2023-05-11 DIAGNOSIS — Z87891 Personal history of nicotine dependence: Secondary | ICD-10-CM | POA: Diagnosis not present

## 2023-05-11 DIAGNOSIS — E785 Hyperlipidemia, unspecified: Secondary | ICD-10-CM | POA: Diagnosis not present

## 2023-05-11 DIAGNOSIS — E1169 Type 2 diabetes mellitus with other specified complication: Secondary | ICD-10-CM | POA: Diagnosis not present

## 2023-05-11 DIAGNOSIS — K219 Gastro-esophageal reflux disease without esophagitis: Secondary | ICD-10-CM | POA: Diagnosis not present

## 2023-05-11 DIAGNOSIS — I10 Essential (primary) hypertension: Secondary | ICD-10-CM | POA: Diagnosis not present

## 2023-05-11 DIAGNOSIS — E1165 Type 2 diabetes mellitus with hyperglycemia: Secondary | ICD-10-CM | POA: Diagnosis not present

## 2023-05-19 NOTE — Progress Notes (Signed)
 Pt screened for HTN, BP found to be WNL. No SDOH needs. Encouraged pt to keep follow up appointment with PCP

## 2023-05-25 DIAGNOSIS — I129 Hypertensive chronic kidney disease with stage 1 through stage 4 chronic kidney disease, or unspecified chronic kidney disease: Secondary | ICD-10-CM | POA: Diagnosis not present

## 2023-05-25 DIAGNOSIS — N181 Chronic kidney disease, stage 1: Secondary | ICD-10-CM | POA: Diagnosis not present

## 2023-05-25 DIAGNOSIS — E1122 Type 2 diabetes mellitus with diabetic chronic kidney disease: Secondary | ICD-10-CM | POA: Diagnosis not present

## 2023-05-25 DIAGNOSIS — R010 Benign and innocent cardiac murmurs: Secondary | ICD-10-CM | POA: Diagnosis not present

## 2023-05-31 NOTE — Progress Notes (Signed)
 [facility] - Clinic Note  06/04/2023    CHIEF COMPLAINT Patient presents for Retina Follow Up   HISTORY OF PRESENT ILLNESS: Sandra Washington is a 83 y.o. female who presents to the clinic today for:   HPI     Retina Follow Up   Patient presents with  Diabetic Retinopathy.  In both eyes.  This started 4 weeks ago.  Duration of 4 weeks.  I, the attending physician,  performed the HPI with the patient and updated documentation appropriately.        Comments   Patient states that vision is blurry at times. Her blood sugar was 122. She is not using eye drops at this time.      Last edited by Rennis Chris, MD on 06/06/2023 12:49 PM.     Pt states her BP and blood sugar are doing well  Referring physician: Renaye Rakers, MD [address] [place],  [place] [address]  HISTORICAL INFORMATION:   Selected notes from the MEDICAL RECORD NUMBER Referred by Dr. Daisy Lazar for concern of diabetic retinopathy   CURRENT MEDICATIONS: No current outpatient medications on file. (Ophthalmic Drugs)   No current facility-administered medications for this visit. (Ophthalmic Drugs)   Current Outpatient Medications (Other)  Medication Sig   ACCU-CHEK SOFTCLIX LANCETS lancets Use bid   acetaminophen (TYLENOL) 500 MG tablet Take 500-1,000 mg by mouth every 6 (six) hours as needed (for back aches).   amLODipine (NORVASC) 5 MG tablet Take 5 mg by mouth 2 (two) times daily.   atorvastatin (LIPITOR) 20 MG tablet Take 1 tablet (20 mg total) by mouth daily. (Patient taking differently: Take 20 mg by mouth at bedtime.)   Blood Glucose Monitoring Suppl (ACCU-CHEK GUIDE ME) w/Device KIT 1 each by Does not apply route 2 (two) times daily. Use accu chek guide me device to check blood sugar twice daily.DX:E11.65   Cholecalciferol (VITAMIN D3) 5000 UNITS CAPS Take 5,000 Units by mouth daily.   empagliflozin (JARDIANCE) 25 MG TABS tablet Take 25 mg by mouth daily.   glucose blood  (ACCU-CHEK AVIVA PLUS) test strip Use to check blood sugar 2 times daily   insulin NPH Human (NOVOLIN N RELION) 100 UNIT/ML injection 8 U in am and 12 U hs (Patient not taking: Reported on 02/26/2023)   insulin regular (NOVOLIN R RELION) 100 units/mL injection INJECT 8 UNITS SUBCUTANEOUSLY WITH BREAKFAST AND LUNCH, AND 16 UNITS WITH DINNER (Patient not taking: Reported on 02/26/2023)   niacin (SLO-NIACIN) 500 MG tablet Take 500 mg by mouth at bedtime.   omeprazole (PRILOSEC) 40 MG capsule Take 40 mg by mouth daily.   ondansetron (ZOFRAN) 4 MG tablet Take 1 tablet (4 mg total) by mouth every 6 (six) hours as needed for nausea.   valsartan-hydrochlorothiazide (DIOVAN-HCT) 160-12.5 MG tablet Take 1 tablet by mouth in the morning.   No current facility-administered medications for this visit. (Other)   REVIEW OF SYSTEMS: ROS   Positive for: Gastrointestinal, Neurological, Endocrine, Eyes Negative for: Constitutional, Skin, Genitourinary, Musculoskeletal, HENT, Cardiovascular, Respiratory, Psychiatric, Allergic/Imm, Heme/Lymph Last edited by Charlette Caffey, COT on 06/04/2023  9:12 AM.      ALLERGIES No Known Allergies  PAST MEDICAL HISTORY Past Medical History:  Diagnosis Date   Complication of anesthesia    Diabetes mellitus without complication (HCC)    Diabetic retinopathy (HCC)    GERD (gastroesophageal reflux disease)    History of kidney stones    Hypertension    Hypertensive  retinopathy    Stroke [facility])    age 31- no residual   Past Surgical History:  Procedure Laterality Date   BREAST BIOPSY Bilateral    benign years ago   CATARACT EXTRACTION Bilateral 2018   Dr. Darel Hong   CHOLECYSTECTOMY N/A 07/01/2020   Procedure: LAPAROSCOPIC CHOLECYSTECTOMY;  Surgeon: Lucretia Roers, MD;  Location: [facility];  Service: General;  Laterality: N/A;   EYE SURGERY     HAND SURGERY     IR RADIOLOGIST EVAL & MGMT  08/05/2020   NO PAST SURGERIES     PARS PLANA VITRECTOMY Right  02/17/2021   Procedure: PARS PLANA VITRECTOMY WITH 25 GAUGE;  Surgeon: Rennis Chris, MD;  Location: [facility] OR;  Service: Ophthalmology;  Laterality: Right;   PHOTOCOAGULATION WITH LASER Right 02/17/2021   Procedure: PHOTOCOAGULATION WITH LASER;  Surgeon: Rennis Chris, MD;  Location: [facility] OR;  Service: Ophthalmology;  Laterality: Right;   FAMILY HISTORY History reviewed. No pertinent family history.  SOCIAL HISTORY Social History   Tobacco Use   Smoking status: Former   Smokeless tobacco: Never   Tobacco comments:    Smoked in [employer]  Vaping Use   Vaping status: Former  Substance Use Topics   Alcohol use: No   Drug use: No       OPHTHALMIC EXAM:  Base Eye Exam     Visual Acuity (Snellen - Linear)       Right Left   Dist cc 20/25 20/25   Dist ph cc NI NI    Correction: Glasses         Tonometry (Tonopen, 9:14 AM)       Right Left   Pressure 16 15         Pupils       Dark Shape   Right 3 Round   Left 3 Round         Visual Fields       Left Right    Full Full         Extraocular Movement       Right Left    Full, Ortho Full, Ortho         Neuro/Psych     Oriented x3: Yes   Mood/Affect: Normal         Dilation     Both eyes: 1.0% Mydriacyl, 2.5% Phenylephrine @ 9:12 AM           Slit Lamp and Fundus Exam     Slit Lamp Exam       Right Left   Lids/Lashes Dermatochalasis - upper lid Dermatochalasis - upper lid   Conjunctiva/Sclera melanosis, nasal and temporal pinguecula nasal and temporal Pinguecula, Melanosis   Cornea Arcus, Well healed temporal cataract wounds, trace PEE, mild tear film debris Arcus, Well healed temporal cataract wounds, trace EBMD   Anterior Chamber deep, clear, narrow temporal angle, No cell or flare deep and clear   Iris Round and dilated, focal iris atrophy at 1100  --  PI not open, No NVI Round and dilated, small patent PI at 0200, mild bowing, No NVI   Lens PC IOL in good position with open PC,  +elschnig pearls PC IOL in good position with open PC   Anterior Vitreous post vitrectomy, clear Vitreous syneresis         Fundus Exam       Right Left   Disc mild Pallor, Sharp rim, mild PPA/PPP Pink and Sharp, Compact, mild PPP   C/D Ratio  0.2 0.2   Macula Flat, Blunted foveal reflex, mild RPE mottling, focal drusen SN to fovea, no edema, scattered MA/DBH and cystic changes greatest temporal mac -- improved, good focal laser changes Flat, Blunted foveal reflex, scattered Microaneurysms, trace cystic changes, no frank edema   Vessels attenuated, mild tortuosity attenuated, mild tortuosity   Periphery Attached, 360 MA/DBH, 360 peripheral laser changes greatest temporally, room for fill in if needed Attached, scattered MA greatest posteriorly, focal DBH temporal periphery           Refraction     Wearing Rx       Sphere Cylinder Axis Add   Right -2.25 +2.25 180 +2.50   Left -1.00 +1.50 175 +2.50    Type: prog           IMAGING AND PROCEDURES  Imaging and Procedures for @TODAY @  OCT, Retina - OU - Both Eyes       Right Eye Quality was good. Central Foveal Thickness: 292. Progression has improved. Findings include normal foveal contour, intraretinal fluid, subretinal fluid (Mild interval improvement in IRF / cystic changes temporal mac and fovea, focal shallow SRF centrally-- resolved).   Left Eye Quality was good. Central Foveal Thickness: 247. Progression has been stable. Findings include normal foveal contour, no SRF, intraretinal hyper-reflective material, intraretinal fluid (Trace cystic changes superior fovea ).   Notes *Images captured and stored on drive  Diagnosis / Impression:  OD: Mild interval improvment in IRF / cystic changes temporal mac and fovea, focal shallow SRF centrally-- resolved OS: trace cystic changes superior fovea   Clinical management:  See below  Abbreviations: NFP - Normal foveal profile. CME - cystoid macular edema. PED - pigment  epithelial detachment. IRF - intraretinal fluid. SRF - subretinal fluid. EZ - ellipsoid zone. ERM - epiretinal membrane. ORA - outer retinal atrophy. ORT - outer retinal tubulation. SRHM - subretinal hyper-reflective material            ASSESSMENT/PLAN:    ICD-10-CM   1. Moderate nonproliferative diabetic retinopathy of both eyes with macular edema associated with type 2 diabetes mellitus (HCC)  E11.3313 OCT, Retina - OU - Both Eyes    2. Long-term current use of injectable noninsulin antidiabetic medication  Z79.85     3. Encounter for long-term (current) use of insulin (HCC)  Z79.4     4. Vitreous hemorrhage of right eye (HCC)  H43.11     5. Essential hypertension  I10     6. Hypertensive retinopathy of both eyes  H35.033     7. Pseudophakia of both eyes  Z96.1     8. Anatomical narrow angle  H40.039      1-3. Moderate nonproliferative diabetic retinopathy w/ DME, OU  - A1C 6.7 (02.09.23) - FA (07.03.23) shows: OD Focal leakage temporal macula, scattered leaking MA, staining of peripheral laser; OS Mild peripheral nonperfusion nasal periphery, scattered MA with late leakage, no NV OU - repeat FA (09.30.24) is stable from July 2023 study -- no NV OU - s/p focal laser OD (01.24.25) - exam with scattered MA OU -- OD w/ h/o VH as below and good focal laser changes  - BCVA OD 20/20 - stable; OS 20/25 from 20/20 - OCT shows OD: Mild interval improvment in IRF / cystic changes temporal mac and fovea, focal shallow SRF centrally -- resolved; OS: trace cystic changes superior fovea    - discussed tight BP control and BS control   - discussed findings, prognosis  - f/u 2-3  months, DFE, OCT  4. Vitreous Hemorrhage OD - onset of floaters and vision loss beginning of Dec 2022 while putting up Christmas decorations - likely related to history of DM and HTN - pt also recalls multiple falls from vertigo around the same time of VH onset - s/p IVA OD #1 (12.08.22) - no significant  improvement - pre op BCVA OD -- CF 1'  - s/p PPV/EL 12.22.22 OD - intra-op -- visualized peripheral ischemia and scattered DBH; no RT/RD - doing great - BCVA OD 20/25 -- stable  - monitor   5,6. Hypertensive retinopathy OU  - discussed importance of tight BP control  - monitor  7. Pseudophakia OU  - s/p CE/IOL OU  - beautiful surgeries by Dr. Vonna Kotyk, doing well  - monitor  8. Anatomical Narrow Angles OU  - s/p LPI OU by Dr. Vonna Kotyk  - OS patent LPI and open angle  - OD w/ closed PI  - saw Sr. Wynelle Link on 09.21.20---no intervention recommended  Ophthalmic Meds Ordered this visit:  No orders of the defined types were placed in this encounter.    Return in about 3 months (around 09/03/2023) for NPDR OU , DFE, OCT.  There are no Patient Instructions on file for this visit.   This document serves as a record of services personally performed by Karie Chimera, MD, PhD. It was created on their behalf by Glee Arvin. Manson Passey, OA an ophthalmic technician. The creation of this record is the provider's dictation and/or activities during the visit.    Electronically signed by: Glee Arvin. Manson Passey, OA 06/06/23 12:50 PM  This document serves as a record of services personally performed by Karie Chimera, MD, PhD. It was created on their behalf by Charlette Caffey, COT an ophthalmic technician. The creation of this record is the provider's dictation and/or activities during the visit.    Electronically signed by:  Charlette Caffey, COT  06/06/23 12:50 PM   Karie Chimera, M.D., Ph.D. Diseases & Surgery of the Retina and Vitreous [facility] 06/04/2023   I have reviewed the above documentation for accuracy and completeness, and I agree with the above. Karie Chimera, M.D., Ph.D. 06/06/23 12:51 PM   Abbreviations: M myopia (nearsighted); A astigmatism; H hyperopia (farsighted); P presbyopia; Mrx spectacle prescription;  CTL contact lenses; OD right eye; OS left eye; OU  both eyes  XT exotropia; ET esotropia; PEK punctate epithelial keratitis; PEE punctate epithelial erosions; DES dry eye syndrome; MGD meibomian gland dysfunction; ATs artificial tears; PFAT's preservative free artificial tears; NSC nuclear sclerotic cataract; PSC posterior subcapsular cataract; ERM epi-retinal membrane; PVD posterior vitreous detachment; RD retinal detachment; DM diabetes mellitus; DR diabetic retinopathy; NPDR non-proliferative diabetic retinopathy; PDR proliferative diabetic retinopathy; CSME clinically significant macular edema; DME diabetic macular edema; dbh dot blot hemorrhages; CWS cotton wool spot; POAG primary open angle glaucoma; C/D cup-to-disc ratio; HVF humphrey visual field; GVF goldmann visual field; OCT optical coherence tomography; IOP intraocular pressure; BRVO Branch retinal vein occlusion; CRVO central retinal vein occlusion; CRAO central retinal artery occlusion; BRAO branch retinal artery occlusion; RT retinal tear; SB scleral buckle; PPV pars plana vitrectomy; VH Vitreous hemorrhage; PRP panretinal laser photocoagulation; IVK intravitreal kenalog; VMT vitreomacular traction; MH Macular hole;  NVD neovascularization of the disc; NVE neovascularization elsewhere; AREDS age related eye disease study; ARMD age related macular degeneration; POAG primary open angle glaucoma; EBMD epithelial/anterior basement membrane dystrophy; ACIOL anterior chamber intraocular lens; IOL intraocular lens; PCIOL posterior  chamber intraocular lens; Phaco/IOL phacoemulsification with intraocular lens placement; PRK photorefractive keratectomy; LASIK laser assisted in situ keratomileusis; HTN hypertension; DM diabetes mellitus; COPD chronic obstructive pulmonary disease

## 2023-06-04 ENCOUNTER — Ambulatory Visit (INDEPENDENT_AMBULATORY_CARE_PROVIDER_SITE_OTHER): Admitting: Ophthalmology

## 2023-06-04 DIAGNOSIS — I10 Essential (primary) hypertension: Secondary | ICD-10-CM | POA: Diagnosis not present

## 2023-06-04 DIAGNOSIS — Z794 Long term (current) use of insulin: Secondary | ICD-10-CM

## 2023-06-04 DIAGNOSIS — H35033 Hypertensive retinopathy, bilateral: Secondary | ICD-10-CM | POA: Diagnosis not present

## 2023-06-04 DIAGNOSIS — H4311 Vitreous hemorrhage, right eye: Secondary | ICD-10-CM

## 2023-06-04 DIAGNOSIS — H40039 Anatomical narrow angle, unspecified eye: Secondary | ICD-10-CM

## 2023-06-04 DIAGNOSIS — Z7985 Long-term (current) use of injectable non-insulin antidiabetic drugs: Secondary | ICD-10-CM | POA: Diagnosis not present

## 2023-06-04 DIAGNOSIS — Z961 Presence of intraocular lens: Secondary | ICD-10-CM

## 2023-06-04 DIAGNOSIS — E113313 Type 2 diabetes mellitus with moderate nonproliferative diabetic retinopathy with macular edema, bilateral: Secondary | ICD-10-CM

## 2023-06-06 ENCOUNTER — Encounter (INDEPENDENT_AMBULATORY_CARE_PROVIDER_SITE_OTHER): Payer: Self-pay | Admitting: Ophthalmology

## 2023-06-11 ENCOUNTER — Other Ambulatory Visit: Payer: Self-pay

## 2023-06-11 ENCOUNTER — Other Ambulatory Visit (HOSPITAL_COMMUNITY): Payer: Self-pay

## 2023-06-11 MED ORDER — NIACIN 500 MG PO TABS
500.0000 mg | ORAL_TABLET | Freq: Every day | ORAL | 1 refills | Status: DC
  Filled 2023-06-11: qty 100, 100d supply, fill #0
  Filled 2023-09-17: qty 80, 80d supply, fill #1

## 2023-06-11 MED ORDER — ATORVASTATIN CALCIUM 20 MG PO TABS
20.0000 mg | ORAL_TABLET | Freq: Every day | ORAL | 0 refills | Status: DC
  Filled 2023-06-11: qty 90, 90d supply, fill #0

## 2023-06-11 MED ORDER — JARDIANCE 25 MG PO TABS
25.0000 mg | ORAL_TABLET | Freq: Every day | ORAL | 1 refills | Status: AC
  Filled 2023-06-11: qty 90, 90d supply, fill #0

## 2023-06-11 MED ORDER — JARDIANCE 25 MG PO TABS
25.0000 mg | ORAL_TABLET | Freq: Every day | ORAL | 1 refills | Status: AC
  Filled 2023-06-11 – 2023-06-21 (×3): qty 90, 90d supply, fill #0
  Filled 2023-09-17: qty 90, 90d supply, fill #1

## 2023-06-11 MED ORDER — AMLODIPINE BESYLATE 5 MG PO TABS
5.0000 mg | ORAL_TABLET | Freq: Every day | ORAL | 1 refills | Status: DC
  Filled 2023-06-11 – 2023-06-20 (×2): qty 90, 90d supply, fill #0
  Filled 2023-09-17: qty 90, 90d supply, fill #1

## 2023-06-11 MED ORDER — OMEPRAZOLE 40 MG PO CPDR
40.0000 mg | DELAYED_RELEASE_CAPSULE | Freq: Every day | ORAL | 1 refills | Status: AC
  Filled 2023-06-11 – 2023-06-14 (×2): qty 90, 90d supply, fill #0
  Filled 2023-12-21: qty 90, 90d supply, fill #1

## 2023-06-11 MED ORDER — VALSARTAN-HYDROCHLOROTHIAZIDE 160-25 MG PO TABS
1.0000 | ORAL_TABLET | Freq: Every morning | ORAL | 1 refills | Status: DC
  Filled 2023-06-11 – 2023-06-20 (×2): qty 90, 90d supply, fill #0
  Filled 2023-09-17: qty 90, 90d supply, fill #1

## 2023-06-11 MED ORDER — OMEPRAZOLE 40 MG PO CPDR
40.0000 mg | DELAYED_RELEASE_CAPSULE | Freq: Every day | ORAL | 1 refills | Status: AC
  Filled 2023-06-11 – 2023-09-17 (×2): qty 90, 90d supply, fill #0

## 2023-06-12 ENCOUNTER — Other Ambulatory Visit: Payer: Self-pay

## 2023-06-12 ENCOUNTER — Other Ambulatory Visit (HOSPITAL_COMMUNITY): Payer: Self-pay

## 2023-06-12 MED ORDER — EMPAGLIFLOZIN 25 MG PO TABS
25.0000 mg | ORAL_TABLET | Freq: Every day | ORAL | 4 refills | Status: AC
  Filled 2023-06-12: qty 30, 30d supply, fill #0
  Filled 2023-12-21: qty 90, 90d supply, fill #0

## 2023-06-13 ENCOUNTER — Other Ambulatory Visit (HOSPITAL_COMMUNITY): Payer: Self-pay

## 2023-06-13 ENCOUNTER — Other Ambulatory Visit: Payer: Self-pay

## 2023-06-14 ENCOUNTER — Other Ambulatory Visit: Payer: Self-pay

## 2023-06-14 ENCOUNTER — Other Ambulatory Visit (HOSPITAL_COMMUNITY): Payer: Self-pay

## 2023-06-14 MED ORDER — CLOTRIMAZOLE-BETAMETHASONE 1-0.05 % EX CREA
TOPICAL_CREAM | Freq: Two times a day (BID) | CUTANEOUS | 4 refills | Status: AC
  Filled 2023-06-14: qty 15, 30d supply, fill #0

## 2023-06-14 MED ORDER — NYSTATIN 100000 UNIT/GM EX CREA
TOPICAL_CREAM | Freq: Two times a day (BID) | CUTANEOUS | 1 refills | Status: AC
  Filled 2023-06-14: qty 30, 15d supply, fill #0

## 2023-06-14 MED ORDER — CHOLESTYRAMINE 4 G PO PACK
8.0000 g | PACK | Freq: Every day | ORAL | 12 refills | Status: AC
  Filled 2023-06-14: qty 60, 30d supply, fill #0

## 2023-06-20 ENCOUNTER — Other Ambulatory Visit: Payer: Self-pay

## 2023-06-20 ENCOUNTER — Other Ambulatory Visit (HOSPITAL_COMMUNITY): Payer: Self-pay

## 2023-06-21 ENCOUNTER — Other Ambulatory Visit (HOSPITAL_COMMUNITY): Payer: Self-pay

## 2023-06-22 NOTE — Progress Notes (Signed)
 Pt attended 05/19/23 screening event with BP of 102/66. Pt noted at event that she does have a PCP. At event pt did not indicate any SDOH needs. Pt also noted that she is a former smoker and listed Private as her insurance at the event.   Per chart review pt does have a PCP (Jonathon Neighbors; [facility]), insurance, and is a former smoker.Pt's last appt with PCP was 11/20/2022 & 04/13/2023 by a rendering provider. Pt does not indicate any SDOH needs at this time.  No additional pt f/u to be scheduled at this time per health equity protocol.

## 2023-08-21 DIAGNOSIS — I10 Essential (primary) hypertension: Secondary | ICD-10-CM | POA: Diagnosis not present

## 2023-08-21 DIAGNOSIS — Z113 Encounter for screening for infections with a predominantly sexual mode of transmission: Secondary | ICD-10-CM | POA: Diagnosis not present

## 2023-08-21 DIAGNOSIS — Z0001 Encounter for general adult medical examination with abnormal findings: Secondary | ICD-10-CM | POA: Diagnosis not present

## 2023-08-21 DIAGNOSIS — E1165 Type 2 diabetes mellitus with hyperglycemia: Secondary | ICD-10-CM | POA: Diagnosis not present

## 2023-08-21 DIAGNOSIS — Z1159 Encounter for screening for other viral diseases: Secondary | ICD-10-CM | POA: Diagnosis not present

## 2023-08-21 DIAGNOSIS — Z6822 Body mass index (BMI) 22.0-22.9, adult: Secondary | ICD-10-CM | POA: Diagnosis not present

## 2023-08-21 DIAGNOSIS — E559 Vitamin D deficiency, unspecified: Secondary | ICD-10-CM | POA: Diagnosis not present

## 2023-08-28 NOTE — Progress Notes (Signed)
 [facility] - Clinic Note  09/03/2023    CHIEF COMPLAINT Patient presents for Retina Follow Up   HISTORY OF PRESENT ILLNESS: Sandra Washington is a 83 y.o. female who presents to the clinic today for:   HPI     Retina Follow Up   Patient presents with  Diabetic Retinopathy.  In both eyes.  This started 3 months ago.  Duration of 3 months.  Since onset it is stable.  I, the attending physician,  performed the HPI with the patient and updated documentation appropriately.        Comments   3 month retina follow up NPDR OU pt is reporting no vision changes noticed she denies any flashes just having some floaters her last reading was 111      Last edited by Valdemar Rogue, MD on 09/03/2023  1:04 PM.    Pt states vision is stable, she is only on Jardiance  for diabetes, she was taken off oral medication and her BP medication  Referring physician: Benjamine Aland, MD [address] [place],  [place] [address]  HISTORICAL INFORMATION:   Selected notes from the MEDICAL RECORD NUMBER Referred by Dr. Oneil Kawasaki for concern of diabetic retinopathy   CURRENT MEDICATIONS: No current outpatient medications on file. (Ophthalmic Drugs)   No current facility-administered medications for this visit. (Ophthalmic Drugs)   Current Outpatient Medications (Other)  Medication Sig   ACCU-CHEK SOFTCLIX LANCETS lancets Use bid   acetaminophen  (TYLENOL ) 500 MG tablet Take 500-1,000 mg by mouth every 6 (six) hours as needed (for back aches).   amLODipine  (NORVASC ) 5 MG tablet Take 5 mg by mouth 2 (two) times daily.   amLODipine  (NORVASC ) 5 MG tablet Take 1 tablet (5 mg total) by mouth daily.   atorvastatin  (LIPITOR) 20 MG tablet Take 1 tablet (20 mg total) by mouth daily. (Patient taking differently: Take 20 mg by mouth at bedtime.)   atorvastatin  (LIPITOR) 20 MG tablet Take 1 tablet (20 mg total) by mouth at bedtime.   Blood Glucose Monitoring Suppl (ACCU-CHEK GUIDE ME) w/Device  KIT 1 each by Does not apply route 2 (two) times daily. Use accu chek guide me device to check blood sugar twice daily.DX:E11.65   Cholecalciferol (VITAMIN D3) 5000 UNITS CAPS Take 5,000 Units by mouth daily.   cholestyramine  (QUESTRAN ) 4 g packet disolve and take 2 packets (8 g total) by mouth daily as directed   clotrimazole -betamethasone  (LOTRISONE ) cream Apply topically to affected area 2 (two) times daily.   empagliflozin  (JARDIANCE ) 25 MG TABS tablet Take 25 mg by mouth daily.   empagliflozin  (JARDIANCE ) 25 MG TABS tablet Take 1 tablet (25 mg total) by mouth daily.   empagliflozin  (JARDIANCE ) 25 MG TABS tablet Take 1 tablet (25 mg total) by mouth daily.   empagliflozin  (JARDIANCE ) 25 MG TABS tablet Take 1 tablet (25 mg total) by mouth daily.   glucose blood (ACCU-CHEK AVIVA PLUS) test strip Use to check blood sugar 2 times daily   insulin  NPH Human (NOVOLIN N RELION) 100 UNIT/ML injection 8 U in am and 12 U hs (Patient not taking: Reported on 02/26/2023)   insulin  regular (NOVOLIN R RELION) 100 units/mL injection INJECT 8 UNITS SUBCUTANEOUSLY WITH BREAKFAST AND LUNCH, AND 16 UNITS WITH DINNER (Patient not taking: Reported on 02/26/2023)   niacin  (SLO-NIACIN ) 500 MG tablet Take 500 mg by mouth at bedtime.   niacin  (VITAMIN B3) 500 MG tablet Take 1 tablet (500 mg total) by mouth daily.  nystatin  cream (MYCOSTATIN ) Apply topically to affected area 2 (two) times daily.   omeprazole  (PRILOSEC) 40 MG capsule Take 40 mg by mouth daily.   omeprazole  (PRILOSEC) 40 MG capsule Take 1 capsule (40 mg total) by mouth daily.   omeprazole  (PRILOSEC) 40 MG capsule Take 1 capsule (40 mg total) by mouth daily.   ondansetron  (ZOFRAN ) 4 MG tablet Take 1 tablet (4 mg total) by mouth every 6 (six) hours as needed for nausea.   valsartan -hydrochlorothiazide  (DIOVAN  HCT) 160-25 MG tablet Take 1 tablet by mouth every morning.   valsartan -hydrochlorothiazide  (DIOVAN -HCT) 160-12.5 MG tablet Take 1 tablet by mouth in  the morning.   No current facility-administered medications for this visit. (Other)   REVIEW OF SYSTEMS: ROS   Positive for: Gastrointestinal, Neurological, Endocrine, Eyes Negative for: Constitutional, Skin, Genitourinary, Musculoskeletal, HENT, Cardiovascular, Respiratory, Psychiatric, Allergic/Imm, Heme/Lymph Last edited by Resa Delon ORN, COT on 09/03/2023  8:58 AM.       ALLERGIES Not on File  PAST MEDICAL HISTORY Past Medical History:  Diagnosis Date   Complication of anesthesia    Diabetes mellitus without complication (HCC)    Diabetic retinopathy (HCC)    GERD (gastroesophageal reflux disease)    History of kidney stones    Hypertension    Hypertensive retinopathy    Stroke [facility])    age 63- no residual   Past Surgical History:  Procedure Laterality Date   BREAST BIOPSY Bilateral    benign years ago   CATARACT EXTRACTION Bilateral 2018   Dr. Milan   CHOLECYSTECTOMY N/A 07/01/2020   Procedure: LAPAROSCOPIC CHOLECYSTECTOMY;  Surgeon: Kallie Manuelita BROCKS, MD;  Location: [facility];  Service: General;  Laterality: N/A;   EYE SURGERY     HAND SURGERY     IR RADIOLOGIST EVAL & MGMT  08/05/2020   NO PAST SURGERIES     PARS PLANA VITRECTOMY Right 02/17/2021   Procedure: PARS PLANA VITRECTOMY WITH 25 GAUGE;  Surgeon: Valdemar Rogue, MD;  Location: [facility] OR;  Service: Ophthalmology;  Laterality: Right;   PHOTOCOAGULATION WITH LASER Right 02/17/2021   Procedure: PHOTOCOAGULATION WITH LASER;  Surgeon: Valdemar Rogue, MD;  Location: [facility] OR;  Service: Ophthalmology;  Laterality: Right;   FAMILY HISTORY History reviewed. No pertinent family history.  SOCIAL HISTORY Social History   Tobacco Use   Smoking status: Former   Smokeless tobacco: Never   Tobacco comments:    Smoked in [employer]  Vaping Use   Vaping status: Former  Substance Use Topics   Alcohol use: No   Drug use: No       OPHTHALMIC EXAM:  Base Eye Exam     Visual Acuity (Snellen - Linear)        Right Left   Dist cc 20/25 20/25 -1   Dist ph cc NI NI         Tonometry (Tonopen, 9:00 AM)       Right Left   Pressure 14 11         Pupils       Pupils Dark Light Shape React APD   Right PERRL 3 2 Round Brisk None   Left PERRL 3 2 Round Brisk None         Visual Fields       Left Right    Full Full         Extraocular Movement       Right Left    Full, Ortho Full, Ortho  Neuro/Psych     Oriented x3: Yes   Mood/Affect: Normal         Dilation     Both eyes: 2.5% Phenylephrine  @ 9:02 AM           Slit Lamp and Fundus Exam     Slit Lamp Exam       Right Left   Lids/Lashes Dermatochalasis - upper lid Dermatochalasis - upper lid   Conjunctiva/Sclera melanosis, nasal and temporal pinguecula nasal and temporal Pinguecula, Melanosis   Cornea Arcus, Well healed temporal cataract wounds, trace PEE, mild tear film debris Arcus, Well healed temporal cataract wounds, trace EBMD   Anterior Chamber deep, clear, narrow temporal angle, No cell or flare deep and clear   Iris Round and dilated, focal iris atrophy at 1100  --  PI not open, No NVI Round and dilated, small patent PI at 0200, mild bowing, No NVI   Lens PC IOL in good position with open PC, +elschnig pearls PC IOL in good position with open PC   Anterior Vitreous post vitrectomy, clear Vitreous syneresis         Fundus Exam       Right Left   Disc mild Pallor, Sharp rim, mild PPA/PPP Pink and Sharp, Compact, mild PPP   C/D Ratio 0.2 0.2   Macula Flat, Blunted foveal reflex, mild RPE mottling, focal drusen SN to fovea, no edema, scattered MA/DBH and cystic changes greatest temporal mac -- improved, good focal laser changes Flat, Blunted foveal reflex, scattered Microaneurysms, trace cystic changes -- improved, no frank edema   Vessels attenuated, mild tortuosity attenuated, mild tortuosity   Periphery Attached, scattered MA/DBH, 360 peripheral laser changes greatest temporally, room for  fill in if needed Attached, scattered MA greatest posteriorly, focal DBH temporal periphery -- improved           Refraction     Wearing Rx       Sphere Cylinder Axis Add   Right -2.25 +2.25 180 +2.50   Left -1.00 +1.50 175 +2.50    Type: prog           IMAGING AND PROCEDURES  Imaging and Procedures for @TODAY @  OCT, Retina - OU - Both Eyes       Right Eye Quality was good. Central Foveal Thickness: 282. Progression has improved. Findings include normal foveal contour, no SRF, intraretinal hyper-reflective material, intraretinal fluid (Mild interval improvement in IRF / cystic changes temporal mac and fovea, focal shallow SRF centrally -- stably resolved).   Left Eye Quality was good. Central Foveal Thickness: 247. Progression has been stable. Findings include normal foveal contour, no SRF, intraretinal hyper-reflective material, intraretinal fluid (Trace cystic changes superior fovea ).   Notes *Images captured and stored on drive  Diagnosis / Impression:  OD: Mild interval improvement in IRF / cystic changes temporal mac and fovea; focal shallow SRF centrally -- stably resolved OS: trace cystic changes superior fovea   Clinical management:  See below  Abbreviations: NFP - Normal foveal profile. CME - cystoid macular edema. PED - pigment epithelial detachment. IRF - intraretinal fluid. SRF - subretinal fluid. EZ - ellipsoid zone. ERM - epiretinal membrane. ORA - outer retinal atrophy. ORT - outer retinal tubulation. SRHM - subretinal hyper-reflective material             ASSESSMENT/PLAN:    ICD-10-CM   1. Moderate nonproliferative diabetic retinopathy of both eyes with macular edema associated with type 2 diabetes mellitus (HCC)  E11.3313 OCT, Retina -  OU - Both Eyes    2. Long-term current use of injectable noninsulin antidiabetic medication  Z79.85     3. Encounter for long-term (current) use of insulin  (HCC)  Z79.4     4. Vitreous hemorrhage of right  eye (HCC)  H43.11     5. Essential hypertension  I10     6. Hypertensive retinopathy of both eyes  H35.033     7. Pseudophakia of both eyes  Z96.1     8. Anatomical narrow angle  H40.039      1-3. Moderate nonproliferative diabetic retinopathy w/ DME, OU  - A1C 6.7 (02.09.23) - FA (07.03.23) shows: OD Focal leakage temporal macula, scattered leaking MA, staining of peripheral laser; OS Mild peripheral nonperfusion nasal periphery, scattered MA with late leakage, no NV OU - repeat FA (09.30.24) is stable from July 2023 study -- no NV OU - s/p focal laser OD (01.24.25) - exam with scattered MA OU -- OD w/ h/o VH as below and good focal laser changes  - BCVA 2025 OU -- stable - OCT shows OD: Mild interval improvment in IRF / cystic changes temporal mac and fovea; focal shallow SRF centrally -- resolved; OS: trace cystic changes superior fovea    - discussed tight BP control and BS control   - discussed findings, prognosis  - f/u 4 months, DFE, OCT   4. Vitreous Hemorrhage OD - onset of floaters and vision loss beginning of Dec 2022 while putting up Christmas decorations - likely related to history of DM and HTN - pt also recalls multiple falls from vertigo around the same time of VH onset - s/p IVA OD #1 (12.08.22) - no significant improvement - pre op BCVA OD -- CF 1'  - s/p PPV/EL 12.22.22 OD - intra-op -- visualized peripheral ischemia and scattered DBH; no RT/RD - doing great - BCVA OD 20/25 -- stable  - monitor  5,6. Hypertensive retinopathy OU  - discussed importance of tight BP control  - monitor  7. Pseudophakia OU  - s/p CE/IOL OU  - beautiful surgeries by Dr. Lavonia, doing well  - monitor  8. Anatomical Narrow Angles OU  - s/p LPI OU by Dr. Lavonia  - OS patent LPI and open angle  - OD w/ closed PI  - saw Sr. Austin on 09.21.20---no intervention recommended   Ophthalmic Meds Ordered this visit:  No orders of the defined types were placed in this encounter.     Return in about 4 months (around 01/04/2024) for f/u NPDR OU, DFE, OCT.  There are no Patient Instructions on file for this visit.   This document serves as a record of services personally performed by Redell JUDITHANN Hans, MD, PhD. It was created on their behalf by Avelina Pereyra, COA an ophthalmic technician. The creation of this record is the provider's dictation and/or activities during the visit.   Electronically signed by: Avelina GORMAN Pereyra, COT  09/03/23  1:06 PM   This document serves as a record of services personally performed by Redell JUDITHANN Hans, MD, PhD. It was created on their behalf by Alan PARAS. Delores, OA an ophthalmic technician. The creation of this record is the provider's dictation and/or activities during the visit.    Electronically signed by: Alan PARAS. Delores, OA 09/03/23 1:06 PM  Redell JUDITHANN Hans, M.D., Ph.D. Diseases & Surgery of the Retina and Vitreous [facility]  I have reviewed the above documentation for accuracy and completeness, and I agree  with the above. Redell JUDITHANN Hans, M.D., Ph.D. 09/03/23 1:06 PM   Abbreviations: M myopia (nearsighted); A astigmatism; H hyperopia (farsighted); P presbyopia; Mrx spectacle prescription;  CTL contact lenses; OD right eye; OS left eye; OU both eyes  XT exotropia; ET esotropia; PEK punctate epithelial keratitis; PEE punctate epithelial erosions; DES dry eye syndrome; MGD meibomian gland dysfunction; ATs artificial tears; PFAT's preservative free artificial tears; NSC nuclear sclerotic cataract; PSC posterior subcapsular cataract; ERM epi-retinal membrane; PVD posterior vitreous detachment; RD retinal detachment; DM diabetes mellitus; DR diabetic retinopathy; NPDR non-proliferative diabetic retinopathy; PDR proliferative diabetic retinopathy; CSME clinically significant macular edema; DME diabetic macular edema; dbh dot blot hemorrhages; CWS cotton wool spot; POAG primary open angle glaucoma; C/D cup-to-disc ratio; HVF  humphrey visual field; GVF goldmann visual field; OCT optical coherence tomography; IOP intraocular pressure; BRVO Branch retinal vein occlusion; CRVO central retinal vein occlusion; CRAO central retinal artery occlusion; BRAO branch retinal artery occlusion; RT retinal tear; SB scleral buckle; PPV pars plana vitrectomy; VH Vitreous hemorrhage; PRP panretinal laser photocoagulation; IVK intravitreal kenalog ; VMT vitreomacular traction; MH Macular hole;  NVD neovascularization of the disc; NVE neovascularization elsewhere; AREDS age related eye disease study; ARMD age related macular degeneration; POAG primary open angle glaucoma; EBMD epithelial/anterior basement membrane dystrophy; ACIOL anterior chamber intraocular lens; IOL intraocular lens; PCIOL posterior chamber intraocular lens; Phaco/IOL phacoemulsification with intraocular lens placement; PRK photorefractive keratectomy; LASIK laser assisted in situ keratomileusis; HTN hypertension; DM diabetes mellitus; COPD chronic obstructive pulmonary disease

## 2023-09-03 ENCOUNTER — Encounter (INDEPENDENT_AMBULATORY_CARE_PROVIDER_SITE_OTHER): Payer: Self-pay | Admitting: Ophthalmology

## 2023-09-03 ENCOUNTER — Ambulatory Visit (INDEPENDENT_AMBULATORY_CARE_PROVIDER_SITE_OTHER): Admitting: Ophthalmology

## 2023-09-03 DIAGNOSIS — H4311 Vitreous hemorrhage, right eye: Secondary | ICD-10-CM

## 2023-09-03 DIAGNOSIS — Z961 Presence of intraocular lens: Secondary | ICD-10-CM | POA: Diagnosis not present

## 2023-09-03 DIAGNOSIS — H40039 Anatomical narrow angle, unspecified eye: Secondary | ICD-10-CM

## 2023-09-03 DIAGNOSIS — H35033 Hypertensive retinopathy, bilateral: Secondary | ICD-10-CM | POA: Diagnosis not present

## 2023-09-03 DIAGNOSIS — Z7985 Long-term (current) use of injectable non-insulin antidiabetic drugs: Secondary | ICD-10-CM

## 2023-09-03 DIAGNOSIS — E113313 Type 2 diabetes mellitus with moderate nonproliferative diabetic retinopathy with macular edema, bilateral: Secondary | ICD-10-CM

## 2023-09-03 DIAGNOSIS — Z794 Long term (current) use of insulin: Secondary | ICD-10-CM | POA: Diagnosis not present

## 2023-09-03 DIAGNOSIS — I10 Essential (primary) hypertension: Secondary | ICD-10-CM

## 2023-09-17 ENCOUNTER — Other Ambulatory Visit (HOSPITAL_COMMUNITY): Payer: Self-pay

## 2023-09-17 ENCOUNTER — Other Ambulatory Visit: Payer: Self-pay

## 2023-09-24 ENCOUNTER — Other Ambulatory Visit (HOSPITAL_COMMUNITY): Payer: Self-pay

## 2023-09-24 ENCOUNTER — Other Ambulatory Visit: Payer: Self-pay

## 2023-09-24 MED ORDER — CHOLESTYRAMINE 4 G PO PACK
PACK | ORAL | 7 refills | Status: AC
  Filled 2023-09-24: qty 60, 30d supply, fill #0
  Filled 2024-01-02: qty 60, 30d supply, fill #1
  Filled 2024-02-18: qty 60, 30d supply, fill #2
  Filled 2024-03-27: qty 60, 30d supply, fill #3

## 2023-11-21 ENCOUNTER — Other Ambulatory Visit (HOSPITAL_COMMUNITY): Payer: Self-pay

## 2023-11-21 ENCOUNTER — Other Ambulatory Visit: Payer: Self-pay

## 2023-11-21 DIAGNOSIS — I10 Essential (primary) hypertension: Secondary | ICD-10-CM | POA: Diagnosis not present

## 2023-11-21 DIAGNOSIS — E559 Vitamin D deficiency, unspecified: Secondary | ICD-10-CM | POA: Diagnosis not present

## 2023-11-21 DIAGNOSIS — A539 Syphilis, unspecified: Secondary | ICD-10-CM | POA: Diagnosis not present

## 2023-11-21 DIAGNOSIS — H9190 Unspecified hearing loss, unspecified ear: Secondary | ICD-10-CM | POA: Diagnosis not present

## 2023-11-21 DIAGNOSIS — Z23 Encounter for immunization: Secondary | ICD-10-CM | POA: Diagnosis not present

## 2023-11-21 DIAGNOSIS — E1165 Type 2 diabetes mellitus with hyperglycemia: Secondary | ICD-10-CM | POA: Diagnosis not present

## 2023-11-21 DIAGNOSIS — E782 Mixed hyperlipidemia: Secondary | ICD-10-CM | POA: Diagnosis not present

## 2023-11-21 MED ORDER — BLOOD GLUCOSE METER KIT
PACK | 0 refills | Status: AC
  Filled 2023-11-21: qty 1, 30d supply, fill #0

## 2023-11-21 MED ORDER — ACCU-CHEK GUIDE TEST VI STRP
ORAL_STRIP | 6 refills | Status: AC
  Filled 2023-11-21: qty 100, 33d supply, fill #0

## 2023-11-21 MED ORDER — ALPRAZOLAM 0.25 MG PO TABS
0.2500 mg | ORAL_TABLET | Freq: Every day | ORAL | 0 refills | Status: AC | PRN
  Filled 2023-11-21: qty 30, 30d supply, fill #0

## 2023-11-29 ENCOUNTER — Telehealth: Payer: Self-pay

## 2023-11-29 NOTE — Progress Notes (Signed)
   11/29/2023  Patient ID: Sandra Washington, female   DOB: Oct 19, 1940, 83 y.o.   MRN: [id number]  Contacted patient regarding medication adherence from a quality report for [facility].   Left patient a voicemail to return my call at their convenience  Heather Factor, PharmD Clinical Pharmacist  [phone]

## 2023-12-06 ENCOUNTER — Other Ambulatory Visit: Payer: Self-pay

## 2023-12-06 ENCOUNTER — Other Ambulatory Visit (HOSPITAL_COMMUNITY): Payer: Self-pay

## 2023-12-17 NOTE — Progress Notes (Signed)
 [facility] - Clinic Note  12/31/2023    CHIEF COMPLAINT Patient presents for Retina Follow Up   HISTORY OF PRESENT ILLNESS: Sandra Washington is a 83 y.o. female who presents to the clinic today for:   HPI     Retina Follow Up   Patient presents with  Diabetic Retinopathy.  In both eyes.  This started 4 months ago.  Duration of 4 months.  Since onset it is stable.  I, the attending physician,  performed the HPI with the patient and updated documentation appropriately.        Comments   4 month retina follow up NPDR OU pt is reporting no vision changes noticed she denies any flashes she has some floaters pt last reading 127 last A1C unknown       Last edited by Valdemar Rogue, MD on 01/06/2024  3:53 PM.     Pt states vision is stable, blood pressure and sugar have both been good.   Referring physician: Benjamine Aland, MD [address] [place],  [place] [address]  HISTORICAL INFORMATION:   Selected notes from the MEDICAL RECORD NUMBER Referred by Dr. Oneil Kawasaki for concern of diabetic retinopathy   CURRENT MEDICATIONS: No current outpatient medications on file. (Ophthalmic Drugs)   No current facility-administered medications for this visit. (Ophthalmic Drugs)   Current Outpatient Medications (Other)  Medication Sig   ACCU-CHEK SOFTCLIX LANCETS lancets Use bid   acetaminophen  (TYLENOL ) 500 MG tablet Take 500-1,000 mg by mouth every 6 (six) hours as needed (for back aches).   ALPRAZolam  (XANAX ) 0.25 MG tablet Take 1 tablet (0.25 mg total) by mouth daily as needed for anxiety.   amLODipine  (NORVASC ) 5 MG tablet Take 5 mg by mouth 2 (two) times daily.   amLODipine  (NORVASC ) 5 MG tablet Take 1 tablet (5 mg total) by mouth daily.   atorvastatin  (LIPITOR) 20 MG tablet Take 1 tablet (20 mg total) by mouth daily. (Patient taking differently: Take 20 mg by mouth at bedtime.)   blood glucose meter kit and supplies Use to check blood sugar 3 times daily    Blood Glucose Monitoring Suppl (ACCU-CHEK GUIDE ME) w/Device KIT 1 each by Does not apply route 2 (two) times daily. Use accu chek guide me device to check blood sugar twice daily.DX:E11.65   Cholecalciferol (VITAMIN D3) 5000 UNITS CAPS Take 5,000 Units by mouth daily.   cholestyramine  (QUESTRAN ) 4 g packet disolve and take 2 packets (8 g total) by mouth daily as directed   cholestyramine  (QUESTRAN ) 4 g packet Dissolve and take 2 packs by mouth once daily as directed   clotrimazole -betamethasone  (LOTRISONE ) cream Apply topically to affected area 2 (two) times daily.   empagliflozin  (JARDIANCE ) 25 MG TABS tablet Take 25 mg by mouth daily.   empagliflozin  (JARDIANCE ) 25 MG TABS tablet Take 1 tablet (25 mg total) by mouth daily.   empagliflozin  (JARDIANCE ) 25 MG TABS tablet Take 1 tablet (25 mg total) by mouth daily.   empagliflozin  (JARDIANCE ) 25 MG TABS tablet Take 1 tablet (25 mg total) by mouth daily.   glucose blood (ACCU-CHEK AVIVA PLUS) test strip Use to check blood sugar 2 times daily   glucose blood (ACCU-CHEK GUIDE TEST) test strip Use to check blood sugar three times daily.   insulin  NPH Human (NOVOLIN N RELION) 100 UNIT/ML injection 8 U in am and 12 U hs   insulin  regular (NOVOLIN R RELION) 100 units/mL injection INJECT 8 UNITS SUBCUTANEOUSLY WITH BREAKFAST AND  LUNCH, AND 16 UNITS WITH DINNER   niacin  (SLO-NIACIN ) 500 MG tablet Take 500 mg by mouth at bedtime.   niacin  (VITAMIN B3) 500 MG tablet Take 1 tablet (500 mg total) by mouth daily.   nystatin  cream (MYCOSTATIN ) Apply topically to affected area 2 (two) times daily.   omeprazole  (PRILOSEC) 40 MG capsule Take 40 mg by mouth daily.   omeprazole  (PRILOSEC) 40 MG capsule Take 1 capsule (40 mg total) by mouth daily.   omeprazole  (PRILOSEC) 40 MG capsule Take 1 capsule (40 mg total) by mouth daily.   ondansetron  (ZOFRAN ) 4 MG tablet Take 1 tablet (4 mg total) by mouth every 6 (six) hours as needed for nausea.    valsartan -hydrochlorothiazide  (DIOVAN  HCT) 160-25 MG tablet Take 1 tablet by mouth every morning.   valsartan -hydrochlorothiazide  (DIOVAN -HCT) 160-12.5 MG tablet Take 1 tablet by mouth in the morning.   No current facility-administered medications for this visit. (Other)   REVIEW OF SYSTEMS: ROS   Positive for: Gastrointestinal, Neurological, Endocrine, Eyes Negative for: Constitutional, Skin, Genitourinary, Musculoskeletal, HENT, Cardiovascular, Respiratory, Psychiatric, Allergic/Imm, Heme/Lymph Last edited by Resa Delon ORN, COT on 12/31/2023  8:50 AM.        ALLERGIES Not on File  PAST MEDICAL HISTORY Past Medical History:  Diagnosis Date   Complication of anesthesia    Diabetes mellitus without complication (HCC)    Diabetic retinopathy (HCC)    GERD (gastroesophageal reflux disease)    History of kidney stones    Hypertension    Hypertensive retinopathy    Stroke [facility])    age 31- no residual   Past Surgical History:  Procedure Laterality Date   BREAST BIOPSY Bilateral    benign years ago   CATARACT EXTRACTION Bilateral 2018   Dr. Milan   CHOLECYSTECTOMY N/A 07/01/2020   Procedure: LAPAROSCOPIC CHOLECYSTECTOMY;  Surgeon: Kallie Manuelita BROCKS, MD;  Location: [facility];  Service: General;  Laterality: N/A;   EYE SURGERY     HAND SURGERY     IR RADIOLOGIST EVAL & MGMT  08/05/2020   NO PAST SURGERIES     PARS PLANA VITRECTOMY Right 02/17/2021   Procedure: PARS PLANA VITRECTOMY WITH 25 GAUGE;  Surgeon: Valdemar Rogue, MD;  Location: [facility] OR;  Service: Ophthalmology;  Laterality: Right;   PHOTOCOAGULATION WITH LASER Right 02/17/2021   Procedure: PHOTOCOAGULATION WITH LASER;  Surgeon: Valdemar Rogue, MD;  Location: [facility] OR;  Service: Ophthalmology;  Laterality: Right;   FAMILY HISTORY History reviewed. No pertinent family history.  SOCIAL HISTORY Social History   Tobacco Use   Smoking status: Former   Smokeless tobacco: Never   Tobacco comments:    Smoked in [employer]  Vaping Use   Vaping status: Former  Substance Use Topics   Alcohol use: No   Drug use: No       OPHTHALMIC EXAM:  Base Eye Exam     Visual Acuity (Snellen - Linear)       Right Left   Dist cc 20/25 20/25 -3   Dist ph cc NI NI         Tonometry (Tonopen, 8:54 AM)       Right Left   Pressure 15 12         Pupils       Pupils Dark Light Shape React APD   Right PERRL 3 2 Round Brisk None   Left PERRL 3 2 Round Brisk None         Visual Fields  Left Right    Full Full         Extraocular Movement       Right Left    Full, Ortho Full, Ortho         Neuro/Psych     Oriented x3: Yes   Mood/Affect: Normal         Dilation     Both eyes: 2.5% Phenylephrine  @ 8:54 AM           Slit Lamp and Fundus Exam     Slit Lamp Exam       Right Left   Lids/Lashes Dermatochalasis - upper lid Dermatochalasis - upper lid   Conjunctiva/Sclera melanosis, nasal and temporal pinguecula nasal and temporal Pinguecula, Melanosis   Cornea Arcus, Well healed temporal cataract wounds, trace PEE, mild tear film debris Arcus, Well healed temporal cataract wounds, trace EBMD   Anterior Chamber deep, clear, narrow temporal angle, No cell or flare deep and clear   Iris Round and dilated, focal iris atrophy at 1100  --  PI not open, No NVI Round and dilated, small patent PI at 0200, mild bowing, No NVI   Lens PC IOL in good position with open PC, +elschnig pearls PC IOL in good position with open PC   Anterior Vitreous post vitrectomy, clear Vitreous syneresis         Fundus Exam       Right Left   Disc mild Pallor, Sharp rim, mild PPA/PPP Pink and Sharp, Compact, mild PPP   C/D Ratio 0.2 0.2   Macula Flat, Blunted foveal reflex, mild RPE mottling, focal drusen ST to fovea, no frank edema, scattered MA/DBH and cystic changes greatest temporal mac, good focal laser changes Flat, Blunted foveal reflex, scattered Microaneurysms, trace cystic changes --  improved, no frank edema   Vessels attenuated, mild tortuosity attenuated, mild tortuosity   Periphery Attached, scattered MA/DBH, 360 peripheral laser changes greatest temporally, room for fill in if needed Attached, scattered MA greatest posteriorly, focal DBH temporal periphery -- improved           Refraction     Wearing Rx       Sphere Cylinder Axis Add   Right -2.25 +2.25 180 +2.50   Left -1.00 +1.50 175 +2.50    Type: prog           IMAGING AND PROCEDURES  Imaging and Procedures for @TODAY @  OCT, Retina - OU - Both Eyes       Right Eye Quality was good. Central Foveal Thickness: 281. Progression has worsened. Findings include normal foveal contour, no SRF, intraretinal hyper-reflective material, intraretinal fluid (Persistent IRF / cystic changes temporal mac and fovea--slightly increased, focal shallow SRF centrally -- stably resolved).   Left Eye Quality was good. Central Foveal Thickness: 239. Progression has improved. Findings include normal foveal contour, no IRF, no SRF, intraretinal hyper-reflective material (Trace cystic changes superior fovea--improved).   Notes *Images captured and stored on drive  Diagnosis / Impression:  OD: Persistent IRF / cystic changes temporal mac and fovea--slightly increased, focal shallow SRF centrally -- stably resolved OS: trace cystic changes superior fovea--improved  Clinical management:  See below  Abbreviations: NFP - Normal foveal profile. CME - cystoid macular edema. PED - pigment epithelial detachment. IRF - intraretinal fluid. SRF - subretinal fluid. EZ - ellipsoid zone. ERM - epiretinal membrane. ORA - outer retinal atrophy. ORT - outer retinal tubulation. SRHM - subretinal hyper-reflective material  ASSESSMENT/PLAN:    ICD-10-CM   1. Moderate nonproliferative diabetic retinopathy of both eyes with macular edema associated with type 2 diabetes mellitus (HCC)  E11.3313 OCT, Retina - OU - Both  Eyes    2. Long-term current use of injectable noninsulin antidiabetic medication  Z79.85     3. Encounter for long-term (current) use of insulin  (HCC)  Z79.4     4. Vitreous hemorrhage of right eye (HCC)  H43.11     5. Essential hypertension  I10     6. Hypertensive retinopathy of both eyes  H35.033     7. Pseudophakia of both eyes  Z96.1     8. Anatomical narrow angle  H40.039       1-3. Moderate nonproliferative diabetic retinopathy w/ DME, OU  - A1C 6.7 (02.09.23) - FA (07.03.23) shows: OD Focal leakage temporal macula, scattered leaking MA, staining of peripheral laser; OS Mild peripheral nonperfusion nasal periphery, scattered MA with late leakage, no NV OU - repeat FA (09.30.24) is stable from July 2023 study -- no NV OU - s/p focal laser OD (01.24.25) - exam with scattered MA OU -- OD w/ h/o VH as below and good focal laser changes  - BCVA 2025 OU -- stable - OCT shows OD: Persistent IRF / cystic changes temporal mac and fovea--slightly increased, focal shallow SRF centrally -- stably resolved; OS: trace cystic changes superior fovea --improved  - discussed tight BP control and BS control   - discussed findings, prognosis  - f/u 3-4 months, DFE, OCT   4. Vitreous Hemorrhage OD - onset of floaters and vision loss beginning of Dec 2022 while putting up Christmas decorations - likely related to history of DM and HTN - pt also recalls multiple falls from vertigo around the same time of VH onset - s/p IVA OD #1 (12.08.22) - no significant improvement - pre op BCVA OD -- CF 1'  - s/p PPV/EL 12.22.22 OD - intra-op -- visualized peripheral ischemia and scattered DBH; no RT/RD - doing great - BCVA OD 20/25 -- stable  - monitor  5,6. Hypertensive retinopathy OU  - discussed importance of tight BP control  - monitor  7. Pseudophakia OU  - s/p CE/IOL OU  - beautiful surgeries by Dr. Lavonia, doing well  - monitor  8. Anatomical Narrow Angles OU  - s/p LPI OU by Dr.  Lavonia  - OS patent LPI and open angle  - OD w/ closed PI  - saw Sr. Austin on 09.21.20---no intervention recommended   Ophthalmic Meds Ordered this visit:  No orders of the defined types were placed in this encounter.    Return for 3-89months NPDR OU, DFE, OCT.  There are no Patient Instructions on file for this visit.   This document serves as a record of services personally performed by Redell JUDITHANN Hans, MD, PhD. It was created on their behalf by Avelina Pereyra, COA an ophthalmic technician. The creation of this record is the provider's dictation and/or activities during the visit.   Electronically signed by: Avelina GORMAN Pereyra, COT  01/06/24  3:54 PM   This document serves as a record of services personally performed by Redell JUDITHANN Hans, MD, PhD. It was created on their behalf by Wanda GEANNIE Keens, COT an ophthalmic technician. The creation of this record is the provider's dictation and/or activities during the visit.    Electronically signed by:  Wanda GEANNIE Keens, COT  01/06/24 3:54 PM  Redell JUDITHANN Hans, M.D., Ph.D. Diseases & Surgery  of the Retina and Vitreous [facility]  I have reviewed the above documentation for accuracy and completeness, and I agree with the above. Redell JUDITHANN Hans, M.D., Ph.D. 01/06/24 3:55 PM   Abbreviations: M myopia (nearsighted); A astigmatism; H hyperopia (farsighted); P presbyopia; Mrx spectacle prescription;  CTL contact lenses; OD right eye; OS left eye; OU both eyes  XT exotropia; ET esotropia; PEK punctate epithelial keratitis; PEE punctate epithelial erosions; DES dry eye syndrome; MGD meibomian gland dysfunction; ATs artificial tears; PFAT's preservative free artificial tears; NSC nuclear sclerotic cataract; PSC posterior subcapsular cataract; ERM epi-retinal membrane; PVD posterior vitreous detachment; RD retinal detachment; DM diabetes mellitus; DR diabetic retinopathy; NPDR non-proliferative diabetic retinopathy; PDR  proliferative diabetic retinopathy; CSME clinically significant macular edema; DME diabetic macular edema; dbh dot blot hemorrhages; CWS cotton wool spot; POAG primary open angle glaucoma; C/D cup-to-disc ratio; HVF humphrey visual field; GVF goldmann visual field; OCT optical coherence tomography; IOP intraocular pressure; BRVO Branch retinal vein occlusion; CRVO central retinal vein occlusion; CRAO central retinal artery occlusion; BRAO branch retinal artery occlusion; RT retinal tear; SB scleral buckle; PPV pars plana vitrectomy; VH Vitreous hemorrhage; PRP panretinal laser photocoagulation; IVK intravitreal kenalog ; VMT vitreomacular traction; MH Macular hole;  NVD neovascularization of the disc; NVE neovascularization elsewhere; AREDS age related eye disease study; ARMD age related macular degeneration; POAG primary open angle glaucoma; EBMD epithelial/anterior basement membrane dystrophy; ACIOL anterior chamber intraocular lens; IOL intraocular lens; PCIOL posterior chamber intraocular lens; Phaco/IOL phacoemulsification with intraocular lens placement; PRK photorefractive keratectomy; LASIK laser assisted in situ keratomileusis; HTN hypertension; DM diabetes mellitus; COPD chronic obstructive pulmonary disease

## 2023-12-21 ENCOUNTER — Other Ambulatory Visit (HOSPITAL_COMMUNITY): Payer: Self-pay

## 2023-12-21 ENCOUNTER — Other Ambulatory Visit: Payer: Self-pay

## 2023-12-23 MED ORDER — VALSARTAN-HYDROCHLOROTHIAZIDE 160-25 MG PO TABS
1.0000 | ORAL_TABLET | Freq: Every morning | ORAL | 1 refills | Status: AC
  Filled 2023-12-23: qty 90, 90d supply, fill #0
  Filled 2024-03-27: qty 90, 90d supply, fill #1

## 2023-12-23 MED ORDER — AMLODIPINE BESYLATE 5 MG PO TABS
5.0000 mg | ORAL_TABLET | Freq: Every day | ORAL | 1 refills | Status: AC
  Filled 2023-12-23: qty 90, 90d supply, fill #0

## 2023-12-23 MED ORDER — NIACIN 500 MG PO TABS
500.0000 mg | ORAL_TABLET | Freq: Every day | ORAL | 1 refills | Status: AC
  Filled 2023-12-23 – 2024-01-02 (×2): qty 90, 90d supply, fill #0

## 2023-12-24 ENCOUNTER — Other Ambulatory Visit (HOSPITAL_COMMUNITY): Payer: Self-pay

## 2023-12-24 ENCOUNTER — Other Ambulatory Visit (HOSPITAL_BASED_OUTPATIENT_CLINIC_OR_DEPARTMENT_OTHER): Payer: Self-pay

## 2023-12-25 ENCOUNTER — Other Ambulatory Visit: Payer: Self-pay

## 2023-12-31 ENCOUNTER — Ambulatory Visit (INDEPENDENT_AMBULATORY_CARE_PROVIDER_SITE_OTHER): Admitting: Ophthalmology

## 2023-12-31 ENCOUNTER — Encounter (INDEPENDENT_AMBULATORY_CARE_PROVIDER_SITE_OTHER): Payer: Self-pay | Admitting: Ophthalmology

## 2023-12-31 DIAGNOSIS — H35033 Hypertensive retinopathy, bilateral: Secondary | ICD-10-CM

## 2023-12-31 DIAGNOSIS — H4311 Vitreous hemorrhage, right eye: Secondary | ICD-10-CM

## 2023-12-31 DIAGNOSIS — I10 Essential (primary) hypertension: Secondary | ICD-10-CM | POA: Diagnosis not present

## 2023-12-31 DIAGNOSIS — E113313 Type 2 diabetes mellitus with moderate nonproliferative diabetic retinopathy with macular edema, bilateral: Secondary | ICD-10-CM | POA: Diagnosis not present

## 2023-12-31 DIAGNOSIS — Z961 Presence of intraocular lens: Secondary | ICD-10-CM | POA: Diagnosis not present

## 2023-12-31 DIAGNOSIS — Z7985 Long-term (current) use of injectable non-insulin antidiabetic drugs: Secondary | ICD-10-CM | POA: Diagnosis not present

## 2023-12-31 DIAGNOSIS — H40039 Anatomical narrow angle, unspecified eye: Secondary | ICD-10-CM

## 2023-12-31 DIAGNOSIS — Z794 Long term (current) use of insulin: Secondary | ICD-10-CM

## 2024-01-02 ENCOUNTER — Other Ambulatory Visit: Payer: Self-pay

## 2024-01-02 ENCOUNTER — Other Ambulatory Visit (HOSPITAL_COMMUNITY): Payer: Self-pay

## 2024-01-06 ENCOUNTER — Encounter (INDEPENDENT_AMBULATORY_CARE_PROVIDER_SITE_OTHER): Payer: Self-pay | Admitting: Ophthalmology

## 2024-02-11 ENCOUNTER — Other Ambulatory Visit (HOSPITAL_COMMUNITY): Payer: Self-pay | Admitting: Family Medicine

## 2024-02-11 DIAGNOSIS — Z1231 Encounter for screening mammogram for malignant neoplasm of breast: Secondary | ICD-10-CM

## 2024-02-18 ENCOUNTER — Other Ambulatory Visit (HOSPITAL_COMMUNITY): Payer: Self-pay

## 2024-03-07 ENCOUNTER — Ambulatory Visit (HOSPITAL_COMMUNITY): Payer: Self-pay

## 2024-03-19 ENCOUNTER — Ambulatory Visit (HOSPITAL_COMMUNITY)
Admission: RE | Admit: 2024-03-19 | Discharge: 2024-03-19 | Disposition: A | Discharge status: Home / Self Care | Payer: Self-pay | Source: Ambulatory Visit | Attending: Family Medicine | Admitting: Family Medicine

## 2024-03-19 ENCOUNTER — Encounter (HOSPITAL_COMMUNITY): Payer: Self-pay

## 2024-03-19 DIAGNOSIS — Z1231 Encounter for screening mammogram for malignant neoplasm of breast: Secondary | ICD-10-CM | POA: Diagnosis present

## 2024-03-27 ENCOUNTER — Other Ambulatory Visit (HOSPITAL_COMMUNITY): Payer: Self-pay

## 2024-04-28 ENCOUNTER — Encounter (INDEPENDENT_AMBULATORY_CARE_PROVIDER_SITE_OTHER): Admitting: Ophthalmology
